# Patient Record
Sex: Male | Born: 1960
Health system: Southern US, Community
[De-identification: ages and names within clinical notes are randomized; demographics above are authoritative.]

## PROBLEM LIST (undated history)

## (undated) DIAGNOSIS — I639 Cerebral infarction, unspecified: Secondary | ICD-10-CM

## (undated) DIAGNOSIS — I2510 Atherosclerotic heart disease of native coronary artery without angina pectoris: Secondary | ICD-10-CM

## (undated) DIAGNOSIS — F32A Depression, unspecified: Secondary | ICD-10-CM

## (undated) DIAGNOSIS — R569 Unspecified convulsions: Secondary | ICD-10-CM

## (undated) DIAGNOSIS — I219 Acute myocardial infarction, unspecified: Secondary | ICD-10-CM

## (undated) DIAGNOSIS — F329 Major depressive disorder, single episode, unspecified: Secondary | ICD-10-CM

## (undated) DIAGNOSIS — C801 Malignant (primary) neoplasm, unspecified: Secondary | ICD-10-CM

## (undated) DIAGNOSIS — F1910 Other psychoactive substance abuse, uncomplicated: Secondary | ICD-10-CM

## (undated) DIAGNOSIS — F419 Anxiety disorder, unspecified: Secondary | ICD-10-CM

## (undated) DIAGNOSIS — G709 Myoneural disorder, unspecified: Secondary | ICD-10-CM

## (undated) DIAGNOSIS — K861 Other chronic pancreatitis: Secondary | ICD-10-CM

## (undated) DIAGNOSIS — I10 Essential (primary) hypertension: Secondary | ICD-10-CM

## (undated) DIAGNOSIS — E119 Type 2 diabetes mellitus without complications: Secondary | ICD-10-CM

## (undated) DIAGNOSIS — M5430 Sciatica, unspecified side: Secondary | ICD-10-CM

## (undated) DIAGNOSIS — E785 Hyperlipidemia, unspecified: Secondary | ICD-10-CM

## (undated) HISTORY — DX: Myoneural disorder, unspecified: G70.9

## (undated) HISTORY — DX: Major depressive disorder, single episode, unspecified: F32.9

## (undated) HISTORY — DX: Anxiety disorder, unspecified: F41.9

## (undated) HISTORY — PX: CORONARY STENT PLACEMENT: SHX1402

## (undated) HISTORY — DX: Hyperlipidemia, unspecified: E78.5

## (undated) HISTORY — DX: Other psychoactive substance abuse, uncomplicated: F19.10

## (undated) HISTORY — PX: BACK SURGERY: SHX140

## (undated) HISTORY — PX: TRACHEOSTOMY CLOSURE: SHX458

## (undated) HISTORY — DX: Type 2 diabetes mellitus without complications: E11.9

## (undated) HISTORY — DX: Depression, unspecified: F32.A

## (undated) HISTORY — DX: Cerebral infarction, unspecified: I63.9

## (undated) HISTORY — DX: Other chronic pancreatitis: K86.1

---

## 2000-12-29 ENCOUNTER — Ambulatory Visit (HOSPITAL_BASED_OUTPATIENT_CLINIC_OR_DEPARTMENT_OTHER): Admission: RE | Admit: 2000-12-29 | Discharge: 2000-12-29 | Payer: Self-pay | Admitting: Orthopedic Surgery

## 2000-12-29 ENCOUNTER — Encounter (INDEPENDENT_AMBULATORY_CARE_PROVIDER_SITE_OTHER): Payer: Self-pay

## 2004-09-04 ENCOUNTER — Emergency Department (HOSPITAL_COMMUNITY): Admission: EM | Admit: 2004-09-04 | Discharge: 2004-09-04 | Payer: Self-pay | Admitting: Emergency Medicine

## 2007-02-25 ENCOUNTER — Ambulatory Visit (HOSPITAL_COMMUNITY): Admission: RE | Admit: 2007-02-25 | Discharge: 2007-02-25 | Payer: Self-pay | Admitting: Emergency Medicine

## 2007-02-25 ENCOUNTER — Emergency Department (HOSPITAL_COMMUNITY): Admission: EM | Admit: 2007-02-25 | Discharge: 2007-02-25 | Payer: Self-pay | Admitting: Emergency Medicine

## 2007-08-29 ENCOUNTER — Inpatient Hospital Stay (HOSPITAL_COMMUNITY): Admission: EM | Admit: 2007-08-29 | Discharge: 2007-08-30 | Payer: Self-pay | Admitting: Emergency Medicine

## 2007-10-14 ENCOUNTER — Inpatient Hospital Stay (HOSPITAL_COMMUNITY): Admission: EM | Admit: 2007-10-14 | Discharge: 2007-10-18 | Payer: Self-pay | Admitting: *Deleted

## 2007-11-15 ENCOUNTER — Inpatient Hospital Stay (HOSPITAL_COMMUNITY): Admission: EM | Admit: 2007-11-15 | Discharge: 2007-11-16 | Payer: Self-pay | Admitting: Emergency Medicine

## 2007-11-15 ENCOUNTER — Ambulatory Visit: Payer: Self-pay | Admitting: Internal Medicine

## 2007-12-15 ENCOUNTER — Emergency Department (HOSPITAL_COMMUNITY): Admission: EM | Admit: 2007-12-15 | Discharge: 2007-12-16 | Payer: Self-pay | Admitting: Emergency Medicine

## 2008-08-15 ENCOUNTER — Emergency Department (HOSPITAL_COMMUNITY): Admission: EM | Admit: 2008-08-15 | Discharge: 2008-08-15 | Payer: Self-pay | Admitting: Emergency Medicine

## 2008-09-24 ENCOUNTER — Emergency Department (HOSPITAL_COMMUNITY): Admission: EM | Admit: 2008-09-24 | Discharge: 2008-09-24 | Payer: Self-pay | Admitting: Emergency Medicine

## 2008-11-04 ENCOUNTER — Emergency Department (HOSPITAL_COMMUNITY): Admission: EM | Admit: 2008-11-04 | Discharge: 2008-11-04 | Payer: Self-pay | Admitting: Emergency Medicine

## 2008-11-08 ENCOUNTER — Emergency Department (HOSPITAL_COMMUNITY): Admission: EM | Admit: 2008-11-08 | Discharge: 2008-11-08 | Payer: Self-pay | Admitting: Emergency Medicine

## 2008-12-19 ENCOUNTER — Ambulatory Visit: Payer: Self-pay | Admitting: Internal Medicine

## 2008-12-19 ENCOUNTER — Inpatient Hospital Stay (HOSPITAL_COMMUNITY): Admission: EM | Admit: 2008-12-19 | Discharge: 2008-12-21 | Payer: Self-pay | Admitting: Emergency Medicine

## 2009-01-19 ENCOUNTER — Emergency Department (HOSPITAL_COMMUNITY): Admission: EM | Admit: 2009-01-19 | Discharge: 2009-01-20 | Payer: Self-pay | Admitting: Emergency Medicine

## 2009-01-21 ENCOUNTER — Emergency Department (HOSPITAL_COMMUNITY): Admission: EM | Admit: 2009-01-21 | Discharge: 2009-01-21 | Payer: Self-pay | Admitting: Emergency Medicine

## 2009-01-23 ENCOUNTER — Inpatient Hospital Stay (HOSPITAL_COMMUNITY): Admission: EM | Admit: 2009-01-23 | Discharge: 2009-01-24 | Payer: Self-pay | Admitting: Emergency Medicine

## 2010-01-26 ENCOUNTER — Emergency Department (HOSPITAL_COMMUNITY): Admission: EM | Admit: 2010-01-26 | Discharge: 2010-01-26 | Payer: Self-pay | Admitting: Emergency Medicine

## 2010-02-17 ENCOUNTER — Emergency Department (HOSPITAL_COMMUNITY)
Admission: EM | Admit: 2010-02-17 | Discharge: 2010-02-17 | Payer: Self-pay | Source: Home / Self Care | Admitting: Emergency Medicine

## 2010-03-24 ENCOUNTER — Encounter
Admission: RE | Admit: 2010-03-24 | Discharge: 2010-03-24 | Payer: Self-pay | Source: Home / Self Care | Attending: Neurosurgery | Admitting: Neurosurgery

## 2010-04-07 ENCOUNTER — Encounter (INDEPENDENT_AMBULATORY_CARE_PROVIDER_SITE_OTHER): Payer: Self-pay | Admitting: Nurse Practitioner

## 2010-04-28 IMAGING — CR DG ABDOMEN 1V
1 series · 1 of 1 positions shown · non-contrast
Comparison: Abdominal ultrasound 10/14/2007

CLINICAL DATA: Vomiting, fever, cough.

ABDOMEN - 1 VIEW

[t abdomen supine]
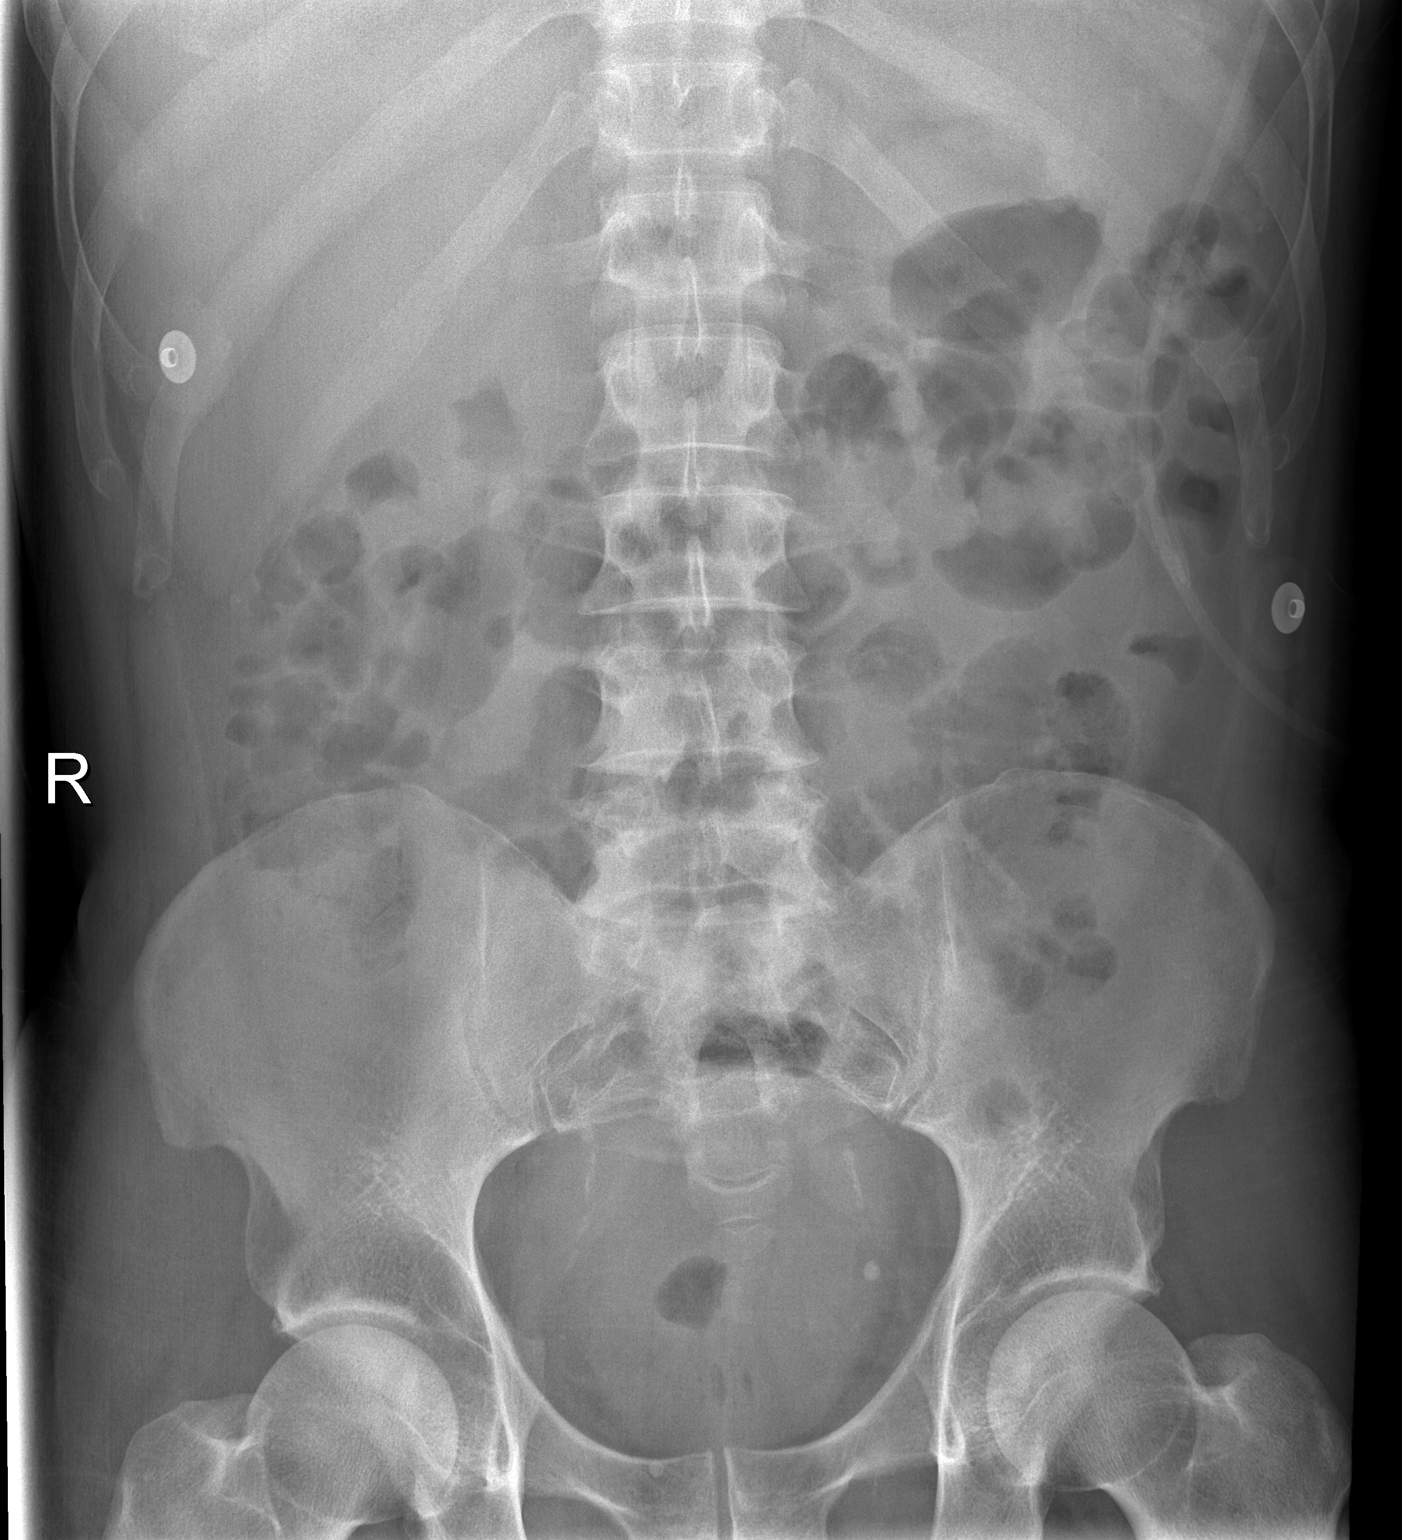

[1 of 1 positions shown; findings below may reference images not displayed]

FINDINGS: There may be scattered mild distension of small bowel.
Gas is seen in the colon and rectum.
IMPRESSION: Nonspecific bowel gas pattern.

## 2010-04-29 ENCOUNTER — Other Ambulatory Visit (HOSPITAL_COMMUNITY): Payer: Self-pay

## 2010-05-01 ENCOUNTER — Ambulatory Visit (HOSPITAL_COMMUNITY): Payer: Medicare Other | Attending: Neurosurgery

## 2010-05-01 ENCOUNTER — Other Ambulatory Visit: Payer: Self-pay | Admitting: Neurosurgery

## 2010-05-01 DIAGNOSIS — Z01818 Encounter for other preprocedural examination: Secondary | ICD-10-CM | POA: Insufficient documentation

## 2010-05-01 NOTE — Letter (Signed)
Summary: [name]   Imported By: Arta Bruce 04/24/2010 10:06:37  _____________________________________________________________________  External Attachment:    Type:   Image     Comment:   External Document

## 2010-05-02 ENCOUNTER — Inpatient Hospital Stay (HOSPITAL_COMMUNITY)
Admission: RE | Admit: 2010-05-02 | Discharge: 2010-05-04 | DRG: 897 | Disposition: A | Discharge status: Home / Self Care | Payer: Medicare Other | Source: Ambulatory Visit | Attending: Psychiatry | Admitting: Psychiatry

## 2010-05-02 ENCOUNTER — Other Ambulatory Visit (HOSPITAL_COMMUNITY): Payer: Medicare Other

## 2010-05-02 DIAGNOSIS — M51379 Other intervertebral disc degeneration, lumbosacral region without mention of lumbar back pain or lower extremity pain: Secondary | ICD-10-CM

## 2010-05-02 DIAGNOSIS — M5137 Other intervertebral disc degeneration, lumbosacral region: Secondary | ICD-10-CM

## 2010-05-02 DIAGNOSIS — F1994 Other psychoactive substance use, unspecified with psychoactive substance-induced mood disorder: Secondary | ICD-10-CM

## 2010-05-02 DIAGNOSIS — F39 Unspecified mood [affective] disorder: Secondary | ICD-10-CM

## 2010-05-02 DIAGNOSIS — Z560 Unemployment, unspecified: Secondary | ICD-10-CM

## 2010-05-02 DIAGNOSIS — F1020 Alcohol dependence, uncomplicated: Secondary | ICD-10-CM

## 2010-05-02 DIAGNOSIS — R45851 Suicidal ideations: Secondary | ICD-10-CM

## 2010-05-02 DIAGNOSIS — F17200 Nicotine dependence, unspecified, uncomplicated: Secondary | ICD-10-CM

## 2010-05-02 DIAGNOSIS — Z6379 Other stressful life events affecting family and household: Secondary | ICD-10-CM

## 2010-05-02 LAB — COMPREHENSIVE METABOLIC PANEL
ALT: 37 U/L (ref 0–53)
AST: 26 U/L (ref 0–37)
Albumin: 4.2 g/dL (ref 3.5–5.2)
CO2: 28 mEq/L (ref 19–32)
Chloride: 101 mEq/L (ref 96–112)
GFR calc Af Amer: >60 mL/min (ref >60)
GFR calc non Af Amer: >60 mL/min (ref >60)
Sodium: 140 mEq/L (ref 135–145)
Total Bilirubin: 0.6 mg/dL (ref 0.3–1.2)

## 2010-05-02 LAB — CBC
HCT: 38.1 % — ABNORMAL LOW (ref 39.0–52.0)
Hemoglobin: 12.6 g/dL — ABNORMAL LOW (ref 13.0–17.0)
RBC: 4.06 MIL/uL — ABNORMAL LOW (ref 4.22–5.81)
WBC: 7.4 10*3/uL (ref 4.0–10.5)

## 2010-05-02 LAB — URINALYSIS, ROUTINE W REFLEX MICROSCOPIC
Hgb urine dipstick: NEGATIVE
Specific Gravity, Urine: 1.043 — ABNORMAL HIGH (ref 1.005–1.030)
Urine Glucose, Fasting: NEGATIVE mg/dL
pH: 6 (ref 5.0–8.0)

## 2010-05-03 ENCOUNTER — Inpatient Hospital Stay (HOSPITAL_COMMUNITY): Admit: 2010-05-03 | Payer: Self-pay

## 2010-05-03 ENCOUNTER — Ambulatory Visit (HOSPITAL_COMMUNITY)
Admission: EM | Admit: 2010-05-03 | Discharge: 2010-05-03 | Disposition: A | Discharge status: Home / Self Care | Payer: Medicare Other | Source: Ambulatory Visit | Attending: Psychiatry | Admitting: Psychiatry

## 2010-05-03 LAB — DRUGS OF ABUSE SCREEN W/O ALC, ROUTINE URINE
Amphetamine Screen, Ur: NEGATIVE
Barbiturate Quant, Ur: NEGATIVE
Cocaine Metabolites: NEGATIVE
Creatinine,U: 440.6 mg/dL
Methadone: NEGATIVE
Phencyclidine (PCP): NEGATIVE

## 2010-05-03 NOTE — H&P (Signed)
NAMEHERBERTO, [name]               ACCOUNT NO.:  [id number]  MEDICAL RECORD NO.:  [id number]           PATIENT TYPE:  I  LOCATION:  0506                          FACILITY:  [facility]  PHYSICIAN:  Marlis Edelson, DO        DATE OF BIRTH:  May 20, 1960  DATE OF ADMISSION:  05/02/2010 DATE OF DISCHARGE:                      PSYCHIATRIC ADMISSION ASSESSMENT   CHIEF COMPLAINT:  Expressed suicidal ideation.  HISTORY OF CHIEF COMPLAINT:  [name] is a 50 year old African American male who was admitted to the [facility] after he presented as a walk-in on the afternoon of May 02, 2010.  He reports that he was in the preop clinic this morning for a scheduled back surgery when he was referred to the [facility] for evaluation.  He apparently had told them in the preop clinic that he took a number of pills in a suicide attempt.  He then left the hospital. The clinic called the police. The police came to his home and talkedwith him.  He was then told to come to the [facility] for evaluation before he could be seen for surgical clearance.  When he presented he was simply asking for psychological clearance in order to return to the surgery clinic and be able to proceed with his scheduled back surgery.  He reported suicidal ideation and attempts by overdose to the assessment team.  He was therefore admitted due to his recent suicide attempt.  He reports to this provider that he took a number of pills and that taking the pills was to "commit suicide.  He reports that he took the pills on Tuesday or Wednesday morning, he thinks it was closer to Wednesday morning.  He believes his wife is cheating on him because he is having a great deal of conflicts with her.  He has no evidence that she is but it is his thought that there is  marital infidelity going on. He was also recently charged with a DUI after having a perfect driving record.  He also stated that he  was scared over having the upcoming surgery.  He decided that suicide was a more viable option than going through with the surgery or dealing with the issues with his wife.  He was insistent upon his arrival and is persistent in his insistence that he has no depressive symptoms.  He was not intoxicated at the time of suicide attempt.  He has no prior history of suicide attempts and he wishes he had never mentioned this to the assessment staff because of the trouble it has caused.  There appears to be a disconnect in his wanting to commit suicide on Wednesday and his current down playing of the severity of this event.  He relates no history of psychiatric care or problems and minimized alcohol even though had a DUI and records indicate he has a more significant problem with alcohol dependency than he likes to admit.  PAST PSYCHIATRIC HISTORY:  He denies any history of psychiatric care. He has never been hospitalized. He has never been evaluated by a psychiatrist. He has had no prior suicide attempts.  He has no history of self-mutilation.  MEDICAL HISTORY:  Review of records indicates a seizure disorder which appears to have been associated with alcohol withdrawal.  He also has a history of alcoholic hepatitis, lumbar spine disease with two previous back surgeries.  He has also had alcoholic related pancreatitis.  He relates no history of loss of consciousness or significant head trauma.  CURRENT MEDICATIONS:  Hydrocodone 8-10 tablets per day, unspecified dose.  DRUG ALLERGIES:  MORPHINE WHICH CAUSES ITCHING.  SOCIAL HISTORY:  He has been married for 27 years.  He has one 25 year old daughter.  He is currently on disability.  Previously he worked as a Community education officer.  Education - high school graduate.  Military history -  [place] [employer] times 11 years.  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  He has no history of legal  entanglement.  No religious preferences.  Denies any history of abuse.  SUBSTANCE USE HISTORY:  He states he is a smoker who has smoked over the past 30 years.  He has recently decreased his smoking down to a half pack of cigarettes per day.  He denies any illicit drugs.  He reports that he drinks one pint about one time per week.  Records reflect that he is a heavier drinker than that.  He denied any history of DTs or addiction, and again minimizes alcohol-related problems.  FAMILY HISTORY:  Maternal aunt has suffered from drug addiction.  His grandmother suffered from hypertension.  MENTAL STATUS EXAM:  He is well-developed, well-nourished, he appeared to be uncomfortable because of back pain which radiates to his left lower extremity.  He ambulates with a cane and has a limp on the left side.  He has no other physical complaints.  He was cooperative, although a bit guarded.  His speech was clear, coherent, regular rate, rhythm, volume and tone.  He expresses his mood is good but his affect is discongruent and appears more restricted.  He is very concerned about having to stay in a psychiatric hospital.  His thought process was linear.  He was goal-directed towards early discharge including discharge following this evaluation.  His thought content per him is unremarkable for any current suicidal ideation.  He has had no homicidal ideation and no perceptual symptoms.  No paranoia.  No delusions.  No ideas of reference.  His judgment appears to be impaired and insight shallow.  Psychomotor activity was within normal limits with the patient moving about some due to his back discomfort  DIAGNOSES:  AXIS I:  Alcohol dependence.  Mood disorder, likely combined type (the mood is likely stemming from both the use of alcohol, current marital stressors and his pain. It could also be exacerbated by his current need for opiate medications for pain control.  Alcohol is likely a major  contributor to his overall mood. AXIS II:  Deferred. AXIS III:  Per past medical history. AXIS IV:  Marital discord and medical problems. AXIS V:  38.  RECOMMENDATIONS:  [name] is being admitted to the hospital for observation. There will need to be a family meeting in order to clear him for discharge particularly given the suicidal comments. Will continue his hydrocodone for pain.  I would like to check some baseline laboratories.  Will provide Librium as needed because of his history of alcohol consumption.  Would also like to supplement him with thiamine. If needed we will institute a full-blown Librium  detox protocol.          ______________________________ Marlis Edelson, DO    DB/MEDQ  D:  05/02/2010  T:  05/02/2010  Job:  [id number]  Electronically Signed by Marlis Edelson MD on 05/03/2010 07:16:28 PM

## 2010-05-04 NOTE — Discharge Summary (Addendum)
[name], [name]               ACCOUNT NO.:  [id number]  MEDICAL RECORD NO.:  [id number]           PATIENT TYPE:  I  LOCATION:  ED                            FACILITY:  [facility]  PHYSICIAN:  Marlis Edelson, DO        DATE OF BIRTH:  1960-10-24  DATE OF ADMISSION:  05/03/2010 DATE OF DISCHARGE:  05/03/2010                              DISCHARGE SUMMARY   CHIEF COMPLAINT:  Suicidal attempt.  HISTORY OF CHIEF COMPLAINT:  [name] is a 50 year old African American male who was admitted to the [facility] on May 02, 2010.  He was in the pre-op clinic for an upcoming back surgery, when he informed them that he had taken an overdose of pills in order to "commit suicide."  The preop clinic was planning on clearing him for a surgery on the day of his admission for back problems, immediately stopped the process and told him to come to the [facility] for a suicidal evaluation.  He left the hospital, they sent thepolice to his home and the surgery was cancelled pending psychiatric treatment.  He stated he had taken the pills on Wednesday. It appeared to be late Tuesday night or early Wednesday morning when he took about 10 Excedrin.  He was thinking, at the time, that his wife was cheating, that he had conflicts with her and he was also scared about having the upcoming surgery.  He had also recently been charged with a DWI after having "a perfect driving record."  He has an unremarkable past psychiatric history.  He has never been hospitalized.  He has never had prior suicidal attempts and he has no history of self-mutilation.  In regards to medical problems, he does suffer from severe back pain. He has had two previous back surgeries and is facing now a third.  He also has a history of alcohol withdrawal, alcoholic hepatitis and pancreatitis, and a seizure disorder thought to be from alcohol dependency.  He tends to minimize his alcohol use.  He was  admitted with an admission diagnosis of mood disorder likely combined type, coming from both alcohol use and severe pain, stemming from his back problems.  He is also currently taking narcotics, which could contribute to depression.  He is alcohol dependent and did admit this following further discussions.  HOSPITAL COURSE:  [name] was admitted to the hospital against his will.  He did not feel that he was actively suicidal or homicidal.  We did admit him to the hospital to evaluate some laboratory.  We continued hydrocodone because of the severity of his pain.  He is currently ambulating with a cane and his wife was contacted by therapists, as well as by this provider, during the course of hospitalization.  He denied any depressive symptoms of the need for antidepressant medications and related that no significant anxiety.  We talked about his need to stop his alcohol use.  He had no suicidal or homicidal ideation on the unit. There was no evidence of psychosis.  No signs of alcohol withdrawal.  He ambulated in the hallway without difficulty  and he remained stable with the exception of some elevated blood pressures, that did appear to be associated with pain.  Laboratory including drugs of abuse showed all values to be negative, with the exception of the opiates he has been prescribed and is currently taking opiates.  TSH was normal at 0.545 uIU/mL.  Direct bilirubin 0.1 mg/dL.  A CMP showed all values to be normal, with the exception of a mild decrease in potassium at 3.3 mEq/L. CBC showed a hemoglobin of 12.6, hematocrit 38.1, white blood cell count 7.4 and platelet count 204; indices were normal.  Urinalysis showed an elevated specific gravity, but the patient had been n.p.o.  His urine was clear, with small amount of bilirubin.  There was no bilirubin present in the blood as per the above direct bilirubin.  There was a trace of ketones.  No blood, no protein, no nitrates, no  leukocytes and no glucose.  The therapist contacted his wife, who did not have safety concerns about [name].  She stated that he had taken over-the-counter medications in the form of Excedrin from what seemed to be frustration.  She related that he also made himself throw up immediately afterwards.  She reports that he works a lot and has had some concerns about marital infidelity, because of a family history of marital infidelity.  She did relate that she will take the patient's medications and lock them up or take them to work with her and administer them after his surgery.  She feels that he was not trying to harm himself and that he is not currently a risk for suicide.  She was provided with adequate information on crisis prevention and how to seek help, should he have any recurrent suicidal or develop homicidal reactions, or other type symptoms.  I spoke with her on the date of discharge.  She had a positive safety plan with supervision of his medications and stated that she would be with him. We felt that his discharge, at this point, would be okay given the amount of supervision and monitoring that can be provided at his home. We also feel it is important for Mr. [name] to ahead and call the pre-op clinic tomorrow, reschedule his appointment, set the surgery and have that as soon as possible, since the pain and his back problem, is a large component of what he is suffering from.  He also is not fearful of having the surgery at this point, having been through it before and he feels that the prognosis this time may be a bit better, as per his surgeons recommendations and advice.  He had no further suicidal or homicidal ideation, no psychosis and his affect was broader.  He was therefore discharged to the company of his wife.  LABORATORY AND IMAGING DATA:  As above.  CONSULTATIONS:  None.  COMPLICATIONS:  None.  PROCEDURES:  None.  MENTAL STATUS EXAMINATION:  At the time  of discharge, he is casually dressed.  His eye contact is fair.  He was engaging with peers and with staff.  His motor behavior was normal.  Speech clear, coherent, regular rate, rhythm, volume and tone.  His level of consciousness was alert. His mood was "good."  His affect is broader.  He has been noted to smile more than at the time of admission.  He is denying pertinent depressive symptoms or anxiety.  He relates no perceptual symptoms, ideas of reference, delusions or paranoia.  No suicidal or homicidal thought, intent or plan.  His judgment appears fair at present.  His insight is a bit shallow in regards to his long-term substance dependency and need for more aggressive treatment.  He is fully oriented.  ASSESSMENT:  AXIS I: Mood disorder, likely combined type, alcohol dependence. AXIS II: Deferred. AXIS III: Per past medical history. AXIS IV: Medical problems. AXIS V: 60.  DISCHARGE INSTRUCTIONS:  He is to return to the hospital for any recurrence of suicidal or homicidal thought, intent or plan.  His wife was instructed to return him to the hospital for any marked change in mood or affect.  He is to continue his current medication which is hydrocodone for pain, as prescribed by his orthopedic surgeon and is to contact that clinic tomorrow for rescheduling of his surgery and pre-op clearance.  I have also recommended that he participated in [place] and other abstinence programs, and recommended individual therapy.  We were unable to set that up since his discharge is on a Sunday, but will refer them to the [facility] for further information and follow up as needed.  PROGNOSIS:  Fair with the appropriate psychiatric follow up and abstinence from alcohol.  He should also have improvement in mood following successful surgery on his back.          ______________________________ Marlis Edelson, DO     DB/MEDQ  D:  05/04/2010  T:  05/04/2010  Job:   [id number]  Electronically Signed by Marlis Edelson MD on 05/20/2010 07:29:01 PM

## 2010-05-07 LAB — OPIATE, QUANTITATIVE, URINE
Codeine Urine: NEGATIVE NG/ML
Hydromorphone GC/MS Conf: 709 NG/ML — ABNORMAL HIGH
Oxycodone, ur: NEGATIVE NG/ML
Oxymorphone: NEGATIVE NG/ML

## 2010-05-16 LAB — HEPATIC FUNCTION PANEL
Albumin: 4.5 g/dL (ref 3.5–5.2)
Alkaline Phosphatase: 61 U/L (ref 39–117)
Indirect Bilirubin: 0.4 mg/dL (ref 0.3–0.9)
Total Protein: 7.2 g/dL (ref 6.0–8.3)

## 2010-05-16 LAB — CBC
HCT: 39.7 % (ref 39.0–52.0)
Hemoglobin: 13.2 g/dL (ref 13.0–17.0)
MCV: 91.5 fL (ref 78.0–100.0)
RBC: 4.34 MIL/uL (ref 4.22–5.81)
WBC: 7.8 10*3/uL (ref 4.0–10.5)

## 2010-05-16 LAB — SURGICAL PCR SCREEN
MRSA, PCR: NEGATIVE
Staphylococcus aureus: NEGATIVE

## 2010-05-16 LAB — BASIC METABOLIC PANEL
BUN: 6 mg/dL (ref 6–23)
Chloride: 100 mEq/L (ref 96–112)
Creatinine, Ser: 0.9 mg/dL (ref 0.4–1.5)
GFR calc Af Amer: >60 mL/min (ref >60)
GFR calc non Af Amer: >60 mL/min (ref >60)

## 2010-05-16 LAB — TYPE AND SCREEN
ABO/RH(D): O POS
Antibody Screen: NEGATIVE

## 2010-06-04 ENCOUNTER — Encounter (HOSPITAL_COMMUNITY)
Admission: RE | Admit: 2010-06-04 | Discharge: 2010-06-04 | Disposition: A | Discharge status: Home / Self Care | Payer: Medicare Other | Source: Ambulatory Visit | Attending: Neurosurgery | Admitting: Neurosurgery

## 2010-06-04 DIAGNOSIS — Z01818 Encounter for other preprocedural examination: Secondary | ICD-10-CM | POA: Insufficient documentation

## 2010-06-04 LAB — CBC
HCT: 45.5 % (ref 39.0–52.0)
MCHC: 34.1 g/dL (ref 30.0–36.0)
Platelets: 231 10*3/uL (ref 150–400)
RDW: 13.9 % (ref 11.5–15.5)
WBC: 9.8 10*3/uL (ref 4.0–10.5)

## 2010-06-04 LAB — BASIC METABOLIC PANEL
BUN: 11 mg/dL (ref 6–23)
Creatinine, Ser: 0.99 mg/dL (ref 0.4–1.5)
GFR calc non Af Amer: >60 mL/min (ref >60)
Glucose, Bld: 102 mg/dL — ABNORMAL HIGH (ref 70–99)
Potassium: 4 mEq/L (ref 3.5–5.1)

## 2010-06-12 ENCOUNTER — Inpatient Hospital Stay (HOSPITAL_COMMUNITY): Payer: Medicare Other

## 2010-06-12 ENCOUNTER — Inpatient Hospital Stay (HOSPITAL_COMMUNITY)
Admission: RE | Admit: 2010-06-12 | Discharge: 2010-06-15 | DRG: 460 | Disposition: A | Discharge status: Home / Self Care | Payer: Medicare Other | Source: Ambulatory Visit | Attending: Neurosurgery | Admitting: Neurosurgery

## 2010-06-12 DIAGNOSIS — M5137 Other intervertebral disc degeneration, lumbosacral region: Secondary | ICD-10-CM | POA: Diagnosis present

## 2010-06-12 DIAGNOSIS — M5126 Other intervertebral disc displacement, lumbar region: Secondary | ICD-10-CM | POA: Diagnosis present

## 2010-06-12 DIAGNOSIS — M48061 Spinal stenosis, lumbar region without neurogenic claudication: Principal | ICD-10-CM | POA: Diagnosis present

## 2010-06-12 DIAGNOSIS — M47817 Spondylosis without myelopathy or radiculopathy, lumbosacral region: Secondary | ICD-10-CM | POA: Diagnosis present

## 2010-06-12 DIAGNOSIS — M51379 Other intervertebral disc degeneration, lumbosacral region without mention of lumbar back pain or lower extremity pain: Secondary | ICD-10-CM | POA: Diagnosis present

## 2010-06-12 LAB — TYPE AND SCREEN
Antibody Screen: NEGATIVE
Antibody Screen: NEGATIVE

## 2010-06-17 NOTE — Op Note (Signed)
[name], [name]               ACCOUNT NO.:  [id number]  MEDICAL RECORD NO.:  [id number]           PATIENT TYPE:  I  LOCATION:  3016                         FACILITY:  [facility]  PHYSICIAN:  Danae Orleans. Venetia Maxon, M.D.  DATE OF BIRTH:  1960/06/24  DATE OF PROCEDURE:  06/12/2010 DATE OF DISCHARGE:                              OPERATIVE REPORT   PREOPERATIVE DIAGNOSES:  Recurrent herniated lumbar disk L4-5 with spondylosis, degenerative disk disease, and radiculopathy.  POSTOPERATIVE DIAGNOSES:  Recurrent herniated lumbar disk L4-5 with spondylosis, degenerative disk disease, and radiculopathy.  PROCEDURE: 1. Redo laminectomy, L4-5. 2. Diskectomy, L4-5. 3. Transforaminal lumbar interbody fusion with PEEK interbody cage,     autograft, allograft, and PureGen. 4. Pedicle screw fixation nonsegmental L4 through L5 levels. 5. Posterolateral arthrodesis, L4 through L5 levels.  SURGEON:  Danae Orleans. Venetia Maxon, MD  ASSISTANT:  Georgiann Cocker, RN  ANESTHESIA:  General endotracheal anesthesia.  ESTIMATED BLOOD LOSS:  150 mL.  COMPLICATIONS:  None.  DISPOSITION:  To recovery.  INDICATIONS:  [name] is a 50 year old man with chronic low back and left leg pain.  He in the remote past had a left L4-5 diskectomy. He has severe degeneration at this level with marked spondylosis and foraminal prolapse with a large recurrent disk herniation and it was elected to take him to surgery for redo diskectomy with fusion at this level.  PROCEDURE:  [name] was brought to the operating room.  Following satisfactory and uncomplicated induction of general endotracheal anesthesia and placement of intravenous lines and Foley catheter, the patient was placed in a prone position on the operating table.  Using the Ridgely table, soft tissue and bone bony prominences were padded appropriately.  Previous incision was reopened after prepping and draping in the usual sterile fashion and infiltrating skin with  local lidocaine.  The old incision was ellipsed out.  The L4 and L5 transverse processes were exposed bilaterally.  There was significant amount of scar tissue overlying L4-5 interspace on the left.  Confirmatory lateral radiograph demonstrated marker probes at the L4 and L5 pedicles. Subsequently, using loupe magnification, the previous scar tissue was taken down on the left side and the inferior facet of L4 on the left was disarticulated and removed.  There was significant amount of spondylitic scar tissue, a large spondylitic ridge causing compression of the left L4 nerve root.  The ridge was taken down with osteophyte tool.  A thorough diskectomy was performed.  Contralateral distraction was performed on the right.  Endplates were prepared and after trial sizing, it was elected to use a 9-mm PEEK TLIF cage which was packed with allograft and PureGen.  A 3 mL of bone autograft which had been run through the bone mill was placed deep in the interspace and countersunk appropriately.  The implant was then tamped into position, countersunk appropriately.  Approximately 5 mL of Vitoss foam pack mixed with autologous blood was then inserted in the interspace and tamped into position as well.  Subsequently with the use of the AP and lateral fluoroscopy, pedicle screws were placed at L4 and L5 bilaterally using 45 mm x 6.5  mm screws at L4 and 40 x 6.5-mm screws at L5.  All screws had excellent purchase and position was confirmed on AP and lateral fluoroscopy.  Posterolateral region was extensively decorticated on the right side of midline at L4 through L5 level.  Remaining Vitoss foam pack was placed and tamped into position.  A 50-mm rod was placed on the right and 40-mm rod was placed on the left.  All screws were locked down in situ.  Self-retaining retractor was removed.  Lumbodorsal fascia was closed with 1 Vicryl sutures, subcutaneous tissues were approximated with 2-0 Vicryl interrupted  inverted sutures.  Skin edges were reapproximated with 3-0 Vicryl subcuticular stitch.  The wound was dressed with Benzoin, Steri-Strips, Telfa gauze, and tape.  The patient was extubated in the operating room and taken to recovery in stable satisfactory condition, having tolerated his operation well.  Counts were correct at the end of the case.     Danae Orleans. Venetia Maxon, M.D.     JDS/MEDQ  D:  06/12/2010  T:  06/13/2010  Job:  [id number]  Electronically Signed by Maeola Harman M.D. on 06/17/2010 07:34:56 AM

## 2010-06-18 LAB — CBC
HCT: 31.1 % — ABNORMAL LOW (ref 39.0–52.0)
HCT: 36.2 % — ABNORMAL LOW (ref 39.0–52.0)
HCT: 46 % (ref 39.0–52.0)
Hemoglobin: 10.6 g/dL — ABNORMAL LOW (ref 13.0–17.0)
Hemoglobin: 12.2 g/dL — ABNORMAL LOW (ref 13.0–17.0)
Hemoglobin: 15.1 g/dL (ref 13.0–17.0)
Hemoglobin: 15.6 g/dL (ref 13.0–17.0)
MCHC: 33.8 g/dL (ref 30.0–36.0)
MCHC: 33.9 g/dL (ref 30.0–36.0)
MCHC: 34 g/dL (ref 30.0–36.0)
MCV: 101.5 fL — ABNORMAL HIGH (ref 78.0–100.0)
MCV: 102.3 fL — ABNORMAL HIGH (ref 78.0–100.0)
MCV: 102.5 fL — ABNORMAL HIGH (ref 78.0–100.0)
Platelets: 166 10*3/uL (ref 150–400)
RBC: 4.39 MIL/uL (ref 4.22–5.81)
RBC: 4.49 MIL/uL (ref 4.22–5.81)
RDW: 17.5 % — ABNORMAL HIGH (ref 11.5–15.5)
RDW: 18.2 % — ABNORMAL HIGH (ref 11.5–15.5)
RDW: 18.5 % — ABNORMAL HIGH (ref 11.5–15.5)
RDW: 18.5 % — ABNORMAL HIGH (ref 11.5–15.5)
WBC: 13.4 10*3/uL — ABNORMAL HIGH (ref 4.0–10.5)

## 2010-06-18 LAB — COMPREHENSIVE METABOLIC PANEL
ALT: 19 U/L (ref 0–53)
ALT: 29 U/L (ref 0–53)
AST: 28 U/L (ref 0–37)
AST: 35 U/L (ref 0–37)
Albumin: 3.6 g/dL (ref 3.5–5.2)
Alkaline Phosphatase: 53 U/L (ref 39–117)
Alkaline Phosphatase: 96 U/L (ref 39–117)
BUN: 3 mg/dL — ABNORMAL LOW (ref 6–23)
BUN: 4 mg/dL — ABNORMAL LOW (ref 6–23)
BUN: 5 mg/dL — ABNORMAL LOW (ref 6–23)
CO2: 20 mEq/L (ref 19–32)
CO2: 22 mEq/L (ref 19–32)
CO2: 24 mEq/L (ref 19–32)
CO2: 25 mEq/L (ref 19–32)
Calcium: 8.3 mg/dL — ABNORMAL LOW (ref 8.4–10.5)
Calcium: 8.8 mg/dL (ref 8.4–10.5)
Calcium: 9.1 mg/dL (ref 8.4–10.5)
Chloride: 98 mEq/L (ref 96–112)
Creatinine, Ser: 1.34 mg/dL (ref 0.4–1.5)
GFR calc Af Amer: >60 mL/min (ref >60)
GFR calc Af Amer: >60 mL/min (ref >60)
GFR calc non Af Amer: 57 mL/min — ABNORMAL LOW (ref >60)
GFR calc non Af Amer: >60 mL/min (ref >60)
GFR calc non Af Amer: >60 mL/min (ref >60)
GFR calc non Af Amer: >60 mL/min (ref >60)
Glucose, Bld: 110 mg/dL — ABNORMAL HIGH (ref 70–99)
Glucose, Bld: 138 mg/dL — ABNORMAL HIGH (ref 70–99)
Glucose, Bld: 72 mg/dL (ref 70–99)
Glucose, Bld: 96 mg/dL (ref 70–99)
Potassium: 3.4 mEq/L — ABNORMAL LOW (ref 3.5–5.1)
Sodium: 134 mEq/L — ABNORMAL LOW (ref 135–145)
Sodium: 138 mEq/L (ref 135–145)
Total Bilirubin: 2 mg/dL — ABNORMAL HIGH (ref 0.3–1.2)
Total Protein: 5.7 g/dL — ABNORMAL LOW (ref 6.0–8.3)
Total Protein: 6.2 g/dL (ref 6.0–8.3)
Total Protein: 7.1 g/dL (ref 6.0–8.3)
Total Protein: 7.3 g/dL (ref 6.0–8.3)

## 2010-06-18 LAB — DIFFERENTIAL
Basophils Absolute: 0.1 10*3/uL (ref 0.0–0.1)
Basophils Relative: 0 % (ref 0–1)
Basophils Relative: 1 % (ref 0–1)
Eosinophils Absolute: 0.4 10*3/uL (ref 0.0–0.7)
Eosinophils Absolute: 0.5 10*3/uL (ref 0.0–0.7)
Eosinophils Relative: 3 % (ref 0–5)
Eosinophils Relative: 3 % (ref 0–5)
Lymphocytes Relative: 8 % — ABNORMAL LOW (ref 12–46)
Lymphs Abs: 1 10*3/uL (ref 0.7–4.0)
Lymphs Abs: 1.7 10*3/uL (ref 0.7–4.0)
Monocytes Relative: 4 % (ref 3–12)
Monocytes Relative: 8 % (ref 3–12)
Neutro Abs: 13.3 10*3/uL — ABNORMAL HIGH (ref 1.7–7.7)
Neutrophils Relative %: 82 % — ABNORMAL HIGH (ref 43–77)

## 2010-06-18 LAB — LACTIC ACID, PLASMA
Lactic Acid, Venous: 0.9 mmol/L (ref 0.5–2.2)
Lactic Acid, Venous: 1.1 mmol/L (ref 0.5–2.2)
Lactic Acid, Venous: 2.2 mmol/L (ref 0.5–2.2)

## 2010-06-18 LAB — URINALYSIS, ROUTINE W REFLEX MICROSCOPIC
Glucose, UA: 100 mg/dL — AB
Hgb urine dipstick: NEGATIVE
Hgb urine dipstick: NEGATIVE
Ketones, ur: 15 mg/dL — AB
Nitrite: POSITIVE — AB
Nitrite: POSITIVE — AB
Protein, ur: 30 mg/dL — AB
Protein, ur: >300 mg/dL — AB
Specific Gravity, Urine: 1.037 — ABNORMAL HIGH (ref 1.005–1.030)
Urobilinogen, UA: 1 mg/dL (ref 0.0–1.0)
Urobilinogen, UA: 1 mg/dL (ref 0.0–1.0)
pH: 6 (ref 5.0–8.0)

## 2010-06-18 LAB — CK TOTAL AND CKMB (NOT AT ARMC)
CK, MB: 0.7 ng/mL (ref 0.3–4.0)
CK, MB: 1 ng/mL (ref 0.3–4.0)
Relative Index: 0.6 (ref 0.0–2.5)
Total CK: 201 U/L (ref 7–232)
Total CK: 232 U/L (ref 7–232)

## 2010-06-18 LAB — LIPASE, BLOOD
Lipase: 267 U/L — ABNORMAL HIGH (ref 11–59)
Lipase: 361 U/L — ABNORMAL HIGH (ref 11–59)
Lipase: 497 U/L — ABNORMAL HIGH (ref 11–59)

## 2010-06-18 LAB — CULTURE, BLOOD (ROUTINE X 2): Culture: NO GROWTH

## 2010-06-18 LAB — URINE MICROSCOPIC-ADD ON

## 2010-06-18 LAB — URINE CULTURE: Colony Count: 90000

## 2010-06-18 LAB — PHOSPHORUS: Phosphorus: 3.2 mg/dL (ref 2.3–4.6)

## 2010-06-18 LAB — RAPID URINE DRUG SCREEN, HOSP PERFORMED: Barbiturates: NOT DETECTED

## 2010-06-18 LAB — MAGNESIUM: Magnesium: 1.3 mg/dL — ABNORMAL LOW (ref 1.5–2.5)

## 2010-06-18 LAB — TROPONIN I: Troponin I: 0.02 ng/mL (ref 0.00–0.06)

## 2010-06-19 LAB — URINALYSIS, ROUTINE W REFLEX MICROSCOPIC
Glucose, UA: NEGATIVE mg/dL
Hgb urine dipstick: NEGATIVE
Ketones, ur: 15 mg/dL — AB
Protein, ur: NEGATIVE mg/dL
Urobilinogen, UA: 0.2 mg/dL (ref 0.0–1.0)

## 2010-06-19 LAB — BASIC METABOLIC PANEL
CO2: 29 mEq/L (ref 19–32)
Calcium: 8 mg/dL — ABNORMAL LOW (ref 8.4–10.5)
Calcium: 9 mg/dL (ref 8.4–10.5)
Chloride: 101 mEq/L (ref 96–112)
GFR calc Af Amer: >60 mL/min (ref >60)
GFR calc Af Amer: >60 mL/min (ref >60)
GFR calc non Af Amer: >60 mL/min (ref >60)
Glucose, Bld: 89 mg/dL (ref 70–99)
Potassium: 3.3 mEq/L — ABNORMAL LOW (ref 3.5–5.1)
Sodium: 138 mEq/L (ref 135–145)
Sodium: 139 mEq/L (ref 135–145)

## 2010-06-19 LAB — BASIC METABOLIC PANEL WITH GFR
BUN: 4 mg/dL — ABNORMAL LOW (ref 6–23)
CO2: 26 meq/L (ref 19–32)
Calcium: 7.9 mg/dL — ABNORMAL LOW (ref 8.4–10.5)
Chloride: 100 meq/L (ref 96–112)
Creatinine, Ser: 0.8 mg/dL (ref 0.4–1.5)
GFR calc non Af Amer: >60 mL/min
Glucose, Bld: 73 mg/dL (ref 70–99)
Potassium: 3 meq/L — ABNORMAL LOW (ref 3.5–5.1)
Sodium: 136 meq/L (ref 135–145)

## 2010-06-19 LAB — DIFFERENTIAL
Basophils Absolute: 0 K/uL (ref 0.0–0.1)
Basophils Absolute: 0.1 10*3/uL (ref 0.0–0.1)
Basophils Relative: 1 % (ref 0–1)
Eosinophils Absolute: 0.7 K/uL (ref 0.0–0.7)
Eosinophils Relative: 8 % — ABNORMAL HIGH (ref 0–5)
Lymphocytes Relative: 16 % (ref 12–46)
Lymphocytes Relative: 16 % (ref 12–46)
Lymphs Abs: 1.4 K/uL (ref 0.7–4.0)
Lymphs Abs: 1.7 10*3/uL (ref 0.7–4.0)
Monocytes Absolute: 0.8 K/uL (ref 0.1–1.0)
Monocytes Relative: 8 % (ref 3–12)
Neutro Abs: 6.3 K/uL (ref 1.7–7.7)
Neutro Abs: 7.3 10*3/uL (ref 1.7–7.7)
Neutrophils Relative %: 68 % (ref 43–77)
Neutrophils Relative %: 71 % (ref 43–77)

## 2010-06-19 LAB — CARDIAC PANEL(CRET KIN+CKTOT+MB+TROPI)
CK, MB: 0.9 ng/mL (ref 0.3–4.0)
CK, MB: 0.9 ng/mL (ref 0.3–4.0)
CK, MB: 1.1 ng/mL (ref 0.3–4.0)
CK, MB: 1.4 ng/mL (ref 0.3–4.0)
CK, MB: 1.5 ng/mL (ref 0.3–4.0)
Relative Index: 0.5 (ref 0.0–2.5)
Relative Index: 0.5 (ref 0.0–2.5)
Relative Index: 0.6 (ref 0.0–2.5)
Relative Index: 0.7 (ref 0.0–2.5)
Relative Index: 0.7 (ref 0.0–2.5)
Total CK: 178 U/L (ref 7–232)
Total CK: 190 U/L (ref 7–232)
Total CK: 192 U/L (ref 7–232)
Total CK: 213 U/L (ref 7–232)
Total CK: 227 U/L (ref 7–232)

## 2010-06-19 LAB — POCT I-STAT, CHEM 8
BUN: 7 mg/dL (ref 6–23)
Chloride: 97 mEq/L (ref 96–112)
Creatinine, Ser: 0.6 mg/dL (ref 0.4–1.5)
Potassium: 3.7 mEq/L (ref 3.5–5.1)
Sodium: 137 mEq/L (ref 135–145)

## 2010-06-19 LAB — CBC
HCT: 31 % — ABNORMAL LOW (ref 39.0–52.0)
HCT: 39.6 % (ref 39.0–52.0)
Hemoglobin: 10.5 g/dL — ABNORMAL LOW (ref 13.0–17.0)
Hemoglobin: 11.1 g/dL — ABNORMAL LOW (ref 13.0–17.0)
MCHC: 33.5 g/dL (ref 30.0–36.0)
MCHC: 33.9 g/dL (ref 30.0–36.0)
MCV: 102.2 fL — ABNORMAL HIGH (ref 78.0–100.0)
MCV: 103.7 fL — ABNORMAL HIGH (ref 78.0–100.0)
Platelets: 172 K/uL (ref 150–400)
Platelets: 293 10*3/uL (ref 150–400)
RBC: 3.04 MIL/uL — ABNORMAL LOW (ref 4.22–5.81)
RBC: 3.19 MIL/uL — ABNORMAL LOW (ref 4.22–5.81)
RDW: 20.1 % — ABNORMAL HIGH (ref 11.5–15.5)
WBC: 10.3 10*3/uL (ref 4.0–10.5)
WBC: 9.3 K/uL (ref 4.0–10.5)
WBC: 9.6 10*3/uL (ref 4.0–10.5)

## 2010-06-19 LAB — POCT CARDIAC MARKERS
CKMB, poc: 1.2 ng/mL (ref 1.0–8.0)
CKMB, poc: <1 ng/mL — ABNORMAL LOW (ref 1.0–8.0)
Myoglobin, poc: 102 ng/mL (ref 12–200)
Myoglobin, poc: 121 ng/mL (ref 12–200)

## 2010-06-19 LAB — CULTURE, BLOOD (ROUTINE X 2): Culture: NO GROWTH

## 2010-06-19 LAB — VITAMIN B12: Vitamin B-12: 443 pg/mL (ref 211–911)

## 2010-06-19 LAB — APTT: aPTT: 28 s (ref 24–37)

## 2010-06-19 LAB — LIPID PANEL
HDL: 58 mg/dL
Total CHOL/HDL Ratio: 2.8 ratio
Triglycerides: 54 mg/dL
VLDL: 11 mg/dL (ref 0–40)

## 2010-06-19 LAB — RAPID URINE DRUG SCREEN, HOSP PERFORMED
Amphetamines: NOT DETECTED
Barbiturates: NOT DETECTED
Benzodiazepines: NOT DETECTED

## 2010-06-19 LAB — MAGNESIUM
Magnesium: 1.2 mg/dL — ABNORMAL LOW (ref 1.5–2.5)
Magnesium: 1.7 mg/dL (ref 1.5–2.5)

## 2010-06-19 LAB — COMPREHENSIVE METABOLIC PANEL
AST: 98 U/L — ABNORMAL HIGH (ref 0–37)
Albumin: 3.7 g/dL (ref 3.5–5.2)
BUN: 6 mg/dL (ref 6–23)
Calcium: 9 mg/dL (ref 8.4–10.5)
Creatinine, Ser: 0.82 mg/dL (ref 0.4–1.5)
GFR calc Af Amer: >60 mL/min (ref >60)
GFR calc non Af Amer: >60 mL/min (ref >60)

## 2010-06-19 LAB — FOLATE: Folate: 3.8 ng/mL

## 2010-06-19 LAB — LIPASE, BLOOD: Lipase: 385 U/L — ABNORMAL HIGH (ref 11–59)

## 2010-06-19 LAB — PROTIME-INR
INR: 1.04 (ref 0.00–1.49)
Prothrombin Time: 13.5 s (ref 11.6–15.2)

## 2010-06-19 LAB — ETHANOL: Alcohol, Ethyl (B): 43 mg/dL — ABNORMAL HIGH (ref 0–10)

## 2010-06-19 LAB — AMMONIA: Ammonia: 38 umol/L — ABNORMAL HIGH (ref 11–35)

## 2010-06-19 LAB — LACTIC ACID, PLASMA: Lactic Acid, Venous: 1.5 mmol/L (ref 0.5–2.2)

## 2010-06-19 NOTE — Discharge Summary (Signed)
  NAMEDECORIAN, [name]               ACCOUNT NO.:  [id number]  MEDICAL RECORD NO.:  [id number]           PATIENT TYPE:  I  LOCATION:  3016                         FACILITY:  [facility]  PHYSICIAN:  Hewitt Shorts, M.D.DATE OF BIRTH:  01/06/1961  DATE OF ADMISSION:  06/12/2010 DATE OF DISCHARGE:  06/15/2010                              DISCHARGE SUMMARY   HISTORY OF PRESENT ILLNESS:  The patient is a 50 year old man under the care of Dr. Venetia Maxon.  He has had previous lumbar surgery, but returns with low back and left lumbar radicular pain.  Dr. Venetia Maxon evaluated him with MRI, which showed significant left lateral recess encroachment causing his left L5 radiculopathy.  The patient was admitted now for decompression and fusion.  General examination was unremarkable. Neurologic examination showed good strength other than for weakness of the right extensor hallucis longus, 4/5.  Sensation was decreased to pinprick in the left L5 distribution.  HOSPITAL COURSE:  The patient was admitted by Dr. Venetia Maxon, underwent a L4- 5 decompression, interbody fusion, and posterolateral fusion. Postoperatively, he has done well.  He was seen by physical therapy and occupational therapy.  He is up and ambulating.  His wound is healing nicely.  He is being given instructions regarding wound care and activities.  He is afebrile.  He is to return for followup with Dr. Venetia Maxon in about 3 weeks.  He has been given prescription for Percocet 1-2 tablets q.4-6 h. p.r.n. pain, 60 tablets, no refills.  DISCHARGE DIAGNOSES:  Lumbar stenosis and lumbar radiculopathy.     Hewitt Shorts, M.D.     RWN/MEDQ  D:  06/15/2010  T:  06/15/2010  Job:  [id number]  Electronically Signed by Shirlean Kelly M.D. on 06/19/2010 09:20:36 AM

## 2010-06-22 LAB — DIFFERENTIAL
Basophils Relative: 0 % (ref 0–1)
Eosinophils Absolute: 0.3 10*3/uL (ref 0.0–0.7)
Eosinophils Relative: 3 % (ref 0–5)
Lymphs Abs: 1.5 10*3/uL (ref 0.7–4.0)
Monocytes Relative: 7 % (ref 3–12)
Neutrophils Relative %: 73 % (ref 43–77)

## 2010-06-22 LAB — LIPASE, BLOOD: Lipase: 65 U/L — ABNORMAL HIGH (ref 11–59)

## 2010-06-22 LAB — POCT CARDIAC MARKERS: CKMB, poc: 1.4 ng/mL (ref 1.0–8.0)

## 2010-06-22 LAB — URINALYSIS, ROUTINE W REFLEX MICROSCOPIC
Glucose, UA: NEGATIVE mg/dL
Ketones, ur: 15 mg/dL — AB
Nitrite: POSITIVE — AB
Protein, ur: 100 mg/dL — AB
pH: 6.5 (ref 5.0–8.0)

## 2010-06-22 LAB — HEPATIC FUNCTION PANEL
ALT: 42 U/L (ref 0–53)
Bilirubin, Direct: 0.5 mg/dL — ABNORMAL HIGH (ref 0.0–0.3)
Indirect Bilirubin: 2.8 mg/dL — ABNORMAL HIGH (ref 0.3–0.9)
Total Protein: 8.1 g/dL (ref 6.0–8.3)

## 2010-06-22 LAB — POCT I-STAT, CHEM 8
Calcium, Ion: 0.98 mmol/L — ABNORMAL LOW (ref 1.12–1.32)
Creatinine, Ser: 1 mg/dL (ref 0.4–1.5)
Glucose, Bld: 133 mg/dL — ABNORMAL HIGH (ref 70–99)
HCT: 47 % (ref 39.0–52.0)
Hemoglobin: 16 g/dL (ref 13.0–17.0)
Potassium: 2.9 mEq/L — ABNORMAL LOW (ref 3.5–5.1)
TCO2: 29 mmol/L (ref 0–100)

## 2010-06-22 LAB — URINE MICROSCOPIC-ADD ON

## 2010-06-22 LAB — CBC
HCT: 42.9 % (ref 39.0–52.0)
MCHC: 33.4 g/dL (ref 30.0–36.0)
MCV: 97.8 fL (ref 78.0–100.0)
RBC: 4.38 MIL/uL (ref 4.22–5.81)

## 2010-06-22 LAB — CULTURE, BLOOD (ROUTINE X 2)
Culture: NO GROWTH
Culture: NO GROWTH

## 2010-06-22 LAB — D-DIMER, QUANTITATIVE: D-Dimer, Quant: 1.59 ug/mL-FEU — ABNORMAL HIGH (ref 0.00–0.48)

## 2010-06-22 LAB — RAPID URINE DRUG SCREEN, HOSP PERFORMED
Barbiturates: NOT DETECTED
Opiates: NOT DETECTED

## 2010-06-23 LAB — DIFFERENTIAL
Basophils Absolute: 0 10*3/uL (ref 0.0–0.1)
Lymphocytes Relative: 14 % (ref 12–46)
Lymphs Abs: 1.9 10*3/uL (ref 0.7–4.0)
Monocytes Absolute: 0.9 10*3/uL (ref 0.1–1.0)
Monocytes Relative: 7 % (ref 3–12)
Neutro Abs: 10.3 10*3/uL — ABNORMAL HIGH (ref 1.7–7.7)

## 2010-06-23 LAB — CBC
HCT: 43.9 % (ref 39.0–52.0)
MCHC: 33.4 g/dL (ref 30.0–36.0)
MCV: 97 fL (ref 78.0–100.0)
Platelets: 186 10*3/uL (ref 150–400)
RDW: 15.2 % (ref 11.5–15.5)

## 2010-06-23 LAB — COMPREHENSIVE METABOLIC PANEL
Albumin: 4.4 g/dL (ref 3.5–5.2)
BUN: 15 mg/dL (ref 6–23)
Calcium: 9.4 mg/dL (ref 8.4–10.5)
Creatinine, Ser: 1.63 mg/dL — ABNORMAL HIGH (ref 0.4–1.5)
GFR calc Af Amer: 55 mL/min — ABNORMAL LOW (ref >60)
Total Bilirubin: 2.6 mg/dL — ABNORMAL HIGH (ref 0.3–1.2)
Total Protein: 8 g/dL (ref 6.0–8.3)

## 2010-06-23 LAB — RAPID URINE DRUG SCREEN, HOSP PERFORMED: Cocaine: NOT DETECTED

## 2010-06-23 LAB — URINALYSIS, ROUTINE W REFLEX MICROSCOPIC
Glucose, UA: NEGATIVE mg/dL
Nitrite: POSITIVE — AB
Specific Gravity, Urine: 1.04 — ABNORMAL HIGH (ref 1.005–1.030)
pH: 5 (ref 5.0–8.0)

## 2010-06-23 LAB — URINE MICROSCOPIC-ADD ON

## 2010-06-23 LAB — MAGNESIUM: Magnesium: 1.8 mg/dL (ref 1.5–2.5)

## 2010-07-07 ENCOUNTER — Emergency Department (HOSPITAL_COMMUNITY)
Admission: EM | Admit: 2010-07-07 | Discharge: 2010-07-07 | Discharge status: Against Medical Advice | Payer: Medicare Other | Attending: Emergency Medicine | Admitting: Emergency Medicine

## 2010-07-07 DIAGNOSIS — R296 Repeated falls: Secondary | ICD-10-CM | POA: Insufficient documentation

## 2010-07-07 DIAGNOSIS — Z9889 Other specified postprocedural states: Secondary | ICD-10-CM | POA: Insufficient documentation

## 2010-07-07 DIAGNOSIS — M549 Dorsalgia, unspecified: Secondary | ICD-10-CM | POA: Insufficient documentation

## 2010-07-09 ENCOUNTER — Emergency Department (HOSPITAL_COMMUNITY): Payer: Medicare Other

## 2010-07-09 ENCOUNTER — Inpatient Hospital Stay (HOSPITAL_COMMUNITY)
Admission: EM | Admit: 2010-07-09 | Discharge: 2010-07-11 | DRG: 066 | Disposition: A | Discharge status: Home / Self Care | Payer: Medicare Other | Attending: Family Medicine | Admitting: Family Medicine

## 2010-07-09 DIAGNOSIS — R4789 Other speech disturbances: Secondary | ICD-10-CM | POA: Diagnosis present

## 2010-07-09 DIAGNOSIS — M549 Dorsalgia, unspecified: Secondary | ICD-10-CM | POA: Diagnosis present

## 2010-07-09 DIAGNOSIS — E7800 Pure hypercholesterolemia, unspecified: Secondary | ICD-10-CM

## 2010-07-09 DIAGNOSIS — I10 Essential (primary) hypertension: Secondary | ICD-10-CM

## 2010-07-09 DIAGNOSIS — E785 Hyperlipidemia, unspecified: Secondary | ICD-10-CM | POA: Diagnosis present

## 2010-07-09 DIAGNOSIS — F17200 Nicotine dependence, unspecified, uncomplicated: Secondary | ICD-10-CM

## 2010-07-09 DIAGNOSIS — R5381 Other malaise: Secondary | ICD-10-CM | POA: Diagnosis present

## 2010-07-09 DIAGNOSIS — K7010 Alcoholic hepatitis without ascites: Secondary | ICD-10-CM | POA: Diagnosis present

## 2010-07-09 DIAGNOSIS — I6350 Cerebral infarction due to unspecified occlusion or stenosis of unspecified cerebral artery: Principal | ICD-10-CM | POA: Diagnosis present

## 2010-07-09 LAB — CARDIAC PANEL(CRET KIN+CKTOT+MB+TROPI)
CK, MB: 0.7 ng/mL (ref 0.3–4.0)
Relative Index: INVALID (ref 0.0–2.5)
Total CK: 80 U/L (ref 7–232)

## 2010-07-09 LAB — COMPREHENSIVE METABOLIC PANEL
AST: 19 U/L (ref 0–37)
Albumin: 3.7 g/dL (ref 3.5–5.2)
CO2: 28 mEq/L (ref 19–32)
Calcium: 9 mg/dL (ref 8.4–10.5)
Creatinine, Ser: 0.97 mg/dL (ref 0.4–1.5)
GFR calc Af Amer: >60 mL/min (ref >60)
GFR calc non Af Amer: >60 mL/min (ref >60)

## 2010-07-09 LAB — CK TOTAL AND CKMB (NOT AT ARMC)
CK, MB: 0.8 ng/mL (ref 0.3–4.0)
Relative Index: INVALID (ref 0.0–2.5)

## 2010-07-09 LAB — CBC
HCT: 37.5 % — ABNORMAL LOW (ref 39.0–52.0)
Hemoglobin: 12.7 g/dL — ABNORMAL LOW (ref 13.0–17.0)
MCV: 90.4 fL (ref 78.0–100.0)
RBC: 4.15 MIL/uL — ABNORMAL LOW (ref 4.22–5.81)
WBC: 8.8 10*3/uL (ref 4.0–10.5)

## 2010-07-09 LAB — APTT: aPTT: 27 seconds (ref 24–37)

## 2010-07-09 LAB — TROPONIN I: Troponin I: 0.03 ng/mL (ref 0.00–0.06)

## 2010-07-09 LAB — DIFFERENTIAL
Lymphocytes Relative: 32 % (ref 12–46)
Lymphs Abs: 2.8 10*3/uL (ref 0.7–4.0)
Neutrophils Relative %: 54 % (ref 43–77)

## 2010-07-09 LAB — POCT I-STAT, CHEM 8
BUN: 6 mg/dL (ref 6–23)
Chloride: 102 mEq/L (ref 96–112)
Sodium: 141 mEq/L (ref 135–145)

## 2010-07-09 LAB — GLUCOSE, CAPILLARY: Glucose-Capillary: 118 mg/dL — ABNORMAL HIGH (ref 70–99)

## 2010-07-10 ENCOUNTER — Other Ambulatory Visit (HOSPITAL_COMMUNITY): Payer: Medicare Other

## 2010-07-10 ENCOUNTER — Inpatient Hospital Stay (HOSPITAL_COMMUNITY): Payer: Medicare Other

## 2010-07-10 LAB — CARDIAC PANEL(CRET KIN+CKTOT+MB+TROPI)
CK, MB: 0.8 ng/mL (ref 0.3–4.0)
Total CK: 79 U/L (ref 7–232)
Troponin I: 0.02 ng/mL (ref 0.00–0.06)

## 2010-07-10 LAB — CBC
HCT: 37.1 % — ABNORMAL LOW (ref 39.0–52.0)
MCHC: 33.2 g/dL (ref 30.0–36.0)
Platelets: 246 10*3/uL (ref 150–400)
RDW: 13.7 % (ref 11.5–15.5)
WBC: 6.9 10*3/uL (ref 4.0–10.5)

## 2010-07-10 LAB — LIPID PANEL
HDL: 25 mg/dL — ABNORMAL LOW (ref >39)
Triglycerides: 137 mg/dL (ref <150)
VLDL: 27 mg/dL (ref 0–40)

## 2010-07-10 LAB — BASIC METABOLIC PANEL
Calcium: 9.5 mg/dL (ref 8.4–10.5)
GFR calc Af Amer: >60 mL/min (ref >60)
GFR calc non Af Amer: >60 mL/min (ref >60)
Glucose, Bld: 93 mg/dL (ref 70–99)
Potassium: 3.5 mEq/L (ref 3.5–5.1)
Sodium: 139 mEq/L (ref 135–145)

## 2010-07-10 LAB — HEMOGLOBIN A1C
Hgb A1c MFr Bld: 5.8 % — ABNORMAL HIGH (ref <5.7)
Mean Plasma Glucose: 120 mg/dL — ABNORMAL HIGH (ref <117)

## 2010-07-10 MED ORDER — GADOBENATE DIMEGLUMINE 529 MG/ML IV SOLN
15.0000 mL | Freq: Once | INTRAVENOUS | Status: AC
  Administered 2010-07-10: 15 mL via INTRAVENOUS

## 2010-07-11 LAB — COMPREHENSIVE METABOLIC PANEL
ALT: 14 U/L (ref 0–53)
AST: 13 U/L (ref 0–37)
Albumin: 3.5 g/dL (ref 3.5–5.2)
Calcium: 9.2 mg/dL (ref 8.4–10.5)
Chloride: 103 mEq/L (ref 96–112)
Creatinine, Ser: 0.85 mg/dL (ref 0.4–1.5)
GFR calc Af Amer: >60 mL/min (ref >60)
Sodium: 141 mEq/L (ref 135–145)

## 2010-07-17 ENCOUNTER — Emergency Department (HOSPITAL_COMMUNITY): Payer: Medicare Other

## 2010-07-17 ENCOUNTER — Inpatient Hospital Stay (HOSPITAL_COMMUNITY)
Admission: EM | Admit: 2010-07-17 | Discharge: 2010-07-19 | DRG: 439 | Disposition: A | Discharge status: Home / Self Care | Payer: Medicare Other | Attending: Family Medicine | Admitting: Family Medicine

## 2010-07-17 ENCOUNTER — Encounter (HOSPITAL_COMMUNITY): Payer: Self-pay | Admitting: Radiology

## 2010-07-17 DIAGNOSIS — R29898 Other symptoms and signs involving the musculoskeletal system: Secondary | ICD-10-CM | POA: Diagnosis present

## 2010-07-17 DIAGNOSIS — Z7982 Long term (current) use of aspirin: Secondary | ICD-10-CM

## 2010-07-17 DIAGNOSIS — E785 Hyperlipidemia, unspecified: Secondary | ICD-10-CM | POA: Diagnosis present

## 2010-07-17 DIAGNOSIS — Z560 Unemployment, unspecified: Secondary | ICD-10-CM

## 2010-07-17 DIAGNOSIS — K7030 Alcoholic cirrhosis of liver without ascites: Secondary | ICD-10-CM

## 2010-07-17 DIAGNOSIS — G8929 Other chronic pain: Secondary | ICD-10-CM | POA: Diagnosis present

## 2010-07-17 DIAGNOSIS — I69998 Other sequelae following unspecified cerebrovascular disease: Secondary | ICD-10-CM

## 2010-07-17 DIAGNOSIS — I10 Essential (primary) hypertension: Secondary | ICD-10-CM | POA: Diagnosis present

## 2010-07-17 DIAGNOSIS — K859 Acute pancreatitis without necrosis or infection, unspecified: Secondary | ICD-10-CM

## 2010-07-17 DIAGNOSIS — K861 Other chronic pancreatitis: Secondary | ICD-10-CM | POA: Diagnosis present

## 2010-07-17 DIAGNOSIS — I69992 Facial weakness following unspecified cerebrovascular disease: Secondary | ICD-10-CM

## 2010-07-17 DIAGNOSIS — K862 Cyst of pancreas: Secondary | ICD-10-CM | POA: Diagnosis present

## 2010-07-17 DIAGNOSIS — E876 Hypokalemia: Secondary | ICD-10-CM | POA: Diagnosis not present

## 2010-07-17 DIAGNOSIS — M549 Dorsalgia, unspecified: Secondary | ICD-10-CM | POA: Diagnosis present

## 2010-07-17 DIAGNOSIS — F17200 Nicotine dependence, unspecified, uncomplicated: Secondary | ICD-10-CM | POA: Diagnosis present

## 2010-07-17 DIAGNOSIS — F1010 Alcohol abuse, uncomplicated: Secondary | ICD-10-CM | POA: Diagnosis present

## 2010-07-17 HISTORY — DX: Essential (primary) hypertension: I10

## 2010-07-17 LAB — COMPREHENSIVE METABOLIC PANEL
ALT: 18 U/L (ref 0–53)
BUN: 14 mg/dL (ref 6–23)
CO2: 28 mEq/L (ref 19–32)
Calcium: 11.4 mg/dL — ABNORMAL HIGH (ref 8.4–10.5)
Creatinine, Ser: 0.83 mg/dL (ref 0.4–1.5)
GFR calc non Af Amer: >60 mL/min (ref >60)
Glucose, Bld: 186 mg/dL — ABNORMAL HIGH (ref 70–99)

## 2010-07-17 LAB — DIFFERENTIAL
Eosinophils Relative: 0 % (ref 0–5)
Lymphocytes Relative: 11 % — ABNORMAL LOW (ref 12–46)
Monocytes Absolute: 0.5 10*3/uL (ref 0.1–1.0)
Monocytes Relative: 3 % (ref 3–12)
Neutro Abs: 11.4 10*3/uL — ABNORMAL HIGH (ref 1.7–7.7)

## 2010-07-17 LAB — CBC
HCT: 43.4 % (ref 39.0–52.0)
Hemoglobin: 15 g/dL (ref 13.0–17.0)
MCH: 30.7 pg (ref 26.0–34.0)
MCHC: 34.6 g/dL (ref 30.0–36.0)
MCV: 88.9 fL (ref 78.0–100.0)

## 2010-07-17 LAB — LIPASE, BLOOD: Lipase: 871 U/L — ABNORMAL HIGH (ref 11–59)

## 2010-07-17 MED ORDER — IOHEXOL 300 MG/ML  SOLN
100.0000 mL | Freq: Once | INTRAMUSCULAR | Status: AC | PRN
  Administered 2010-07-17: 100 mL via INTRAVENOUS

## 2010-07-18 LAB — RAPID URINE DRUG SCREEN, HOSP PERFORMED
Amphetamines: NOT DETECTED
Barbiturates: NOT DETECTED
Benzodiazepines: NOT DETECTED
Cocaine: NOT DETECTED
Opiates: NOT DETECTED

## 2010-07-18 LAB — BASIC METABOLIC PANEL
BUN: 8 mg/dL (ref 6–23)
Chloride: 99 mEq/L (ref 96–112)
GFR calc Af Amer: >60 mL/min (ref >60)
Potassium: 3.4 mEq/L — ABNORMAL LOW (ref 3.5–5.1)
Sodium: 136 mEq/L (ref 135–145)

## 2010-07-18 LAB — CBC
MCV: 87.8 fL (ref 78.0–100.0)
Platelets: 249 10*3/uL (ref 150–400)
RBC: 4.44 MIL/uL (ref 4.22–5.81)
WBC: 12.8 10*3/uL — ABNORMAL HIGH (ref 4.0–10.5)

## 2010-07-18 NOTE — Discharge Summary (Signed)
[name], [name]               ACCOUNT NO.:  [id number]  MEDICAL RECORD NO.:  [id number]           PATIENT TYPE:  I  LOCATION:  3005                         FACILITY:  [facility]  PHYSICIAN:  Leighton Roach Keerstin Bjelland, M.D.DATE OF BIRTH:  [birth year]  DATE OF ADMISSION:  07/09/2010 DATE OF DISCHARGE:  07/11/2010                              DISCHARGE SUMMARY   PRIMARY CARE PROVIDER:  Demetria Pore, MD, [facility].  DISCHARGE DIAGNOSES: 1. Right corona cerebrovascular accident. 2. Hypertension. 3. Hyperlipidemia. 4. L4-L5 decompression in April 2012. 5. Chronic back pain. 6. Alcoholic hepatitis. 7. History of pancreatitis.  DISCHARGE MEDICATIONS: 1. Aspirin 325 mg p.o. daily. 2. Lisinopril/hydrochlorothiazide 10/12.5 one tablet p.o. daily. 3. Pravastatin 80 mg p.o. every night. 4. Vicodin 5/325 one tablet p.o. q.4 h. p.r.n. back and leg pain.  CONSULTS:  None.  PROCEDURES: 1. CT of head without contrast showing remote lacunar-type basal     ganglia infarcts and periventricular white matter disease.  No     findings for acute hemispheric infarction or intracranial     hemorrhage. 2. MRI/MRA of head with and without contrast showing acute right     corona radiata infarct extending toward posterior limb and right     internal capsule.  No mass effect or hemorrhage.  Very severe     chronic small vessel ischemia of bilateral deep great matter     nuclei.  Minimal evidence of carotid atherosclerosis, only on the     left.  No significant cervical carotid artery stenosis.  Evidence     of moderate right greater than left vertebral artery     atherosclerosis.  Hemodynamically significant stenosis suspected in     the right V1 segment, but preserved distal flow.  ICA siphon     atherosclerosis right greater than left.  Moderate stenosis in the     distal right ICA cavernous segment with preserved distal flow.     Mild to moderate basilar artery atherosclerosis without  associated     stenosis.  Otherwise only minimal evidence of intracranial     atherosclerotic disease.  No other intracranial stenosis and no     major branch occlusion.  LABORATORY DATA:  On admission, The patient's CBC was stable with white count of 8.8, hemoglobin 13.6, and platelets 251.  INR 1.0.  Glucose 118.  CMET unremarkable, 141/3.4/102/28/6/1.0/122.  T-bili 1.1, alk phos 68, AST/ALT 19/12, total protein 6.9, albumin 3.7, calcium 9.0. Hemoglobin A1c 5.8.  Cardiac enzymes negative x3.  Troponin 0.03, 0.02, 0.02.  At the time of discharge, the patient's CMET was unchanged with the exception of creatinine improved to 0.85.  Alcohol level was less than 5 on admission.  Fasting lipid panel showed cholesterol 189, triglycerides 137, HDL 25, and LDL 137.  BRIEF HOSPITAL COURSE:  This is a 50 year old male with known alcohol and tobacco abuse presenting with a 2-day history of left-sided weakness and facial droop consistent with new right corona infarct seen on MRI. 1. Acute CVA:  Although the patient is young at age 14, he is a smoker     with hypertension and hyperlipidemia, no  hypercoagulability or     vasculitis, workup needs to be pursued.  There does not appear to     be any significant carotid stenosis on the right, therefore     Vascular Surgery does not need to be involved.  The patient was     started on aspirin as well as blood pressure and hyperlipidemia     control.  The patient's echo showed no cardiac source of emboli,     normal systolic function with ejection fraction of 60-65%, and     normal wall motion.  Symptoms rapidly improved after admission.  On     the day of discharge, the patient had only mild left-sided     weakness, however, did have a noticeable left-sided facial droop     which continued.  The patient was given smoking cessation     information, at this time was not entered in smoking cessation.     The patient will require home health speech therapy  and outpatient     physical therapy.  Physical therapy will be 4 times a week for 1     week to improve balance, ambulation with a cane, and decrease his     fall risk.  Speech Therapy will work with the patient at home to     increase the patient's clarity of speech and to be certain that     there are no residual difficulties with swallowing. 2. Tobacco abuse:  The patient is given smoking cessation materials     and strongly encouraged to quit. 3. History of alcohol abuse:  The patient's alcohol level was less     than 5 on admission, however CIWA protocol was initiated.  The     patient did not require any Ativan while in-house. 4. Hypertension:  The patient's blood pressures ranged mostly in the     140s-150s on admission.  He was started on an ACE/Dyazide     combination of lisinopril/hydrochlorothiazide.  He will need BMET     at his followup appointment to ensure that his creatinine and     electrolytes are tolerating.  The patient did not become     hypotensive after starting his blood pressure medication. 5. Hyperlipidemia:  The patient with elevated triglycerides and LDL     and low HDL.  The patient was started on statin, Pravachol 80 every     night for secondary prevention. 6. Chronic back pain:  The patient was continued on his home Vicodin     p.r.n.  DISCHARGE INSTRUCTIONS:  The patient was instructed to increase activity slowly and walk with assistance, specifically a cane as per Physical Therapy.  He was also instructed to follow a low-sodium, heart-healthy diet and to stop smoking.  FOLLOWUP APPOINTMENTS:  The patient is to become a new patient with Dr. Fara Boros at [facility]  His new patient appointment is Thursday, Jul 31, 2010, at 2:15 p.m.  DISCHARGE CONDITION:  The patient was discharged home with good family support and 24-hour supervision in stable medical condition.    ______________________________ Demetria Pore,  MD   ______________________________ Leighton Roach Emilianna Barlowe, M.D.    JM/MEDQ  D:  07/14/2010  T:  07/15/2010  Job:  [id number]  Electronically Signed by Demetria Pore MD on 07/16/2010 11:40:52 AM Electronically Signed by Acquanetta Belling M.D. on 07/18/2010 11:28:12 AM

## 2010-07-19 LAB — CBC
MCH: 29.6 pg (ref 26.0–34.0)
MCHC: 33.6 g/dL (ref 30.0–36.0)
MCV: 88.3 fL (ref 78.0–100.0)
Platelets: 243 10*3/uL (ref 150–400)
RDW: 13.2 % (ref 11.5–15.5)

## 2010-07-19 LAB — BASIC METABOLIC PANEL
BUN: 6 mg/dL (ref 6–23)
Calcium: 10.3 mg/dL (ref 8.4–10.5)
Creatinine, Ser: 0.61 mg/dL (ref 0.4–1.5)
GFR calc Af Amer: >60 mL/min (ref >60)

## 2010-07-24 ENCOUNTER — Other Ambulatory Visit: Payer: Self-pay | Admitting: Family Medicine

## 2010-07-24 ENCOUNTER — Encounter: Payer: Self-pay | Admitting: Family Medicine

## 2010-07-24 DIAGNOSIS — E785 Hyperlipidemia, unspecified: Secondary | ICD-10-CM | POA: Insufficient documentation

## 2010-07-24 DIAGNOSIS — M549 Dorsalgia, unspecified: Secondary | ICD-10-CM | POA: Insufficient documentation

## 2010-07-24 DIAGNOSIS — K860 Alcohol-induced chronic pancreatitis: Secondary | ICD-10-CM | POA: Insufficient documentation

## 2010-07-24 DIAGNOSIS — F17200 Nicotine dependence, unspecified, uncomplicated: Secondary | ICD-10-CM | POA: Insufficient documentation

## 2010-07-24 DIAGNOSIS — G8929 Other chronic pain: Secondary | ICD-10-CM | POA: Insufficient documentation

## 2010-07-24 DIAGNOSIS — I10 Essential (primary) hypertension: Secondary | ICD-10-CM | POA: Insufficient documentation

## 2010-07-24 DIAGNOSIS — K859 Acute pancreatitis without necrosis or infection, unspecified: Secondary | ICD-10-CM | POA: Insufficient documentation

## 2010-07-24 DIAGNOSIS — I639 Cerebral infarction, unspecified: Secondary | ICD-10-CM | POA: Insufficient documentation

## 2010-07-24 MED ORDER — PRAVASTATIN SODIUM 80 MG PO TABS: 80.0000 mg | ORAL_TABLET | Freq: Every evening | ORAL | Status: DC

## 2010-07-24 MED ORDER — AMLODIPINE BESYLATE 10 MG PO TABS: 10.0000 mg | ORAL_TABLET | Freq: Every day | ORAL | Status: DC

## 2010-07-24 MED ORDER — LISINOPRIL-HYDROCHLOROTHIAZIDE 10-12.5 MG PO TABS: 1.0000 | ORAL_TABLET | Freq: Every day | ORAL | Status: DC

## 2010-07-24 MED ORDER — ASPIRIN EC 325 MG PO TBEC: 325.0000 mg | DELAYED_RELEASE_TABLET | Freq: Every day | ORAL | Status: AC

## 2010-07-24 MED ORDER — HYDROCODONE-ACETAMINOPHEN 5-325 MG PO TABS: 1.0000 | ORAL_TABLET | ORAL | Status: AC | PRN

## 2010-07-24 MED ORDER — PANTOPRAZOLE SODIUM 40 MG PO TBEC: 40.0000 mg | DELAYED_RELEASE_TABLET | Freq: Every day | ORAL | Status: DC

## 2010-07-29 NOTE — H&P (Signed)
NAMEZAKARI, BATHE               ACCOUNT NO.:  [id number]   MEDICAL RECORD NO.:  [id number]          PATIENT TYPE:  INP   LOCATION:  0103                         FACILITY:  [facility]   PHYSICIAN:  Marcellus Scott, MD     DATE OF BIRTH:  [birth year]   DATE OF ADMISSION:  10/14/2007  DATE OF DISCHARGE:                              HISTORY & PHYSICAL   PRIMARY MEDICAL DOCTOR:  Unassigned.   CHIEF COMPLAINT:  Intractable nausea, vomiting.  Upper abdominal pain.  Shakes and dizziness.   HISTORY OF PRESENT ILLNESS:  Mr. Schmale is a 50 year old African  American male patient with history of chronic heavy alcohol use, tobacco  abuse, chronic low back pain who started having nausea and vomiting 2  days back.  He says he vomited about 4 to 5 times, each day.  The  initial vomitus had some dark-colored emesis but no bright red blood.  Subsequent emeses have been food or drink that he has consumed with no  coffee grounds or blood.  He has mostly not been able to keep any food  or drinks down.  He has associated upper abdominal burning type of  intermittent, nonradiating pain.  Despite these symptoms, the patient  has continued to consume alcohol that he can tolerate.  His last drink  was this morning, which consisted of a drink of gin.   Since yesterday, the patient has been having shakes, which today have  progressively gotten worse, following which he was brought to the  emergency room.  Here he was noted to be in alcohol withdrawal.  He has  received multiple doses of Ativan.  He is feeling slightly better but  continues to have some shakes.  He is also tachycardic and hypertensive.  He claims he has tolerated a burger and a drink that he consumed some  time ago.  The patient denies any other complaints.   PAST MEDICAL HISTORY:  1. Chronic low back pain.  2. Chronic alcoholism.  3. Tobacco abuse, ongoing.  4. History of heart murmur.   PAST SURGICAL HISTORY:  History of 2 or 3 back  surgeries.   PSYCHIATRIC:  History of depression.   ALLERGIES:  MORPHINE CAUSES ITCHING.   MEDICATIONS:  None.   FAMILY HISTORY:  Noncontributory.   SOCIAL HISTORY:  The patient is married.  The patient's spouse is at the  bedside providing some history also.  He is on disability secondary to  his back pain.  He is independent of activities of daily living.  He  drinks a pint of gin per day for the last 5 to 6 years at least if not  more.  The CAGE questionnaire is positive.  He has a 30-pack-year  smoking history but denies any other drug abuse.   REVIEW OF SYSTEMS:  Fourteen systems reviewed and apart from history of  presenting illness is noncontributory.  The patient denies any previous  history of peptic ulcer disease or GI bleeds.  He has no constipation or  diarrhea.  No fever.  Denies any chest pain or dyspnea.   PHYSICAL EXAMINATION:  GENERAL:  Mr. Schleyer is a moderately built and  nourished male patient who is anxious and intermittently tremulous of  his extremities.  VITAL SIGNS:  Heart rate is in the 110s to 120s per minute, regular  tachycardia.  Blood pressure is in the 180s over 100s mmHg.  Respiratory  rate 22 per minute, saturating at 100% on room air and afebrile.  HEENT:  Nontraumatic, normocephalic.  Pupils equally reacting to light  and accommodation.  Oral cavity with dry mucosa but no blood or coffee  grounds.  NECK:  Supple.  No JVD or carotid bruit.  LYMPHATICS:  No lymphadenopathy.  RESPIRATORY:  Clear to auscultation.  CARDIOVASCULAR:  First and second heart sounds heard.  Regular,  tachycardic.  No third or fourth heart sounds or murmurs.  ABDOMEN:  Nondistended.  Hernial orifices are normal.  Mild tenderness  in the epigastrium and right upper quadrant.  Liver is palpable 3 cm  below the right costal margin and is firm and nonpulsatile.  There is no  rigidity, guarding or rebound.  Bowel sounds are normally heard.  CENTRAL NERVOUS SYSTEM:  The  patient is awake, alert, oriented x3.  No  focal neurological deficit.  EXTREMITIES:  With no cyanosis, clubbing or edema.  Peripheral pulses  are symmetrically felt.  SKIN:  Without any rashes.   LABORATORY DATA:  Urine drug screen is negative.  Comprehensive  metabolic panel with sodium 138, potassium 2.9, chloride 95, bicarb 27,  glucose 99, BUN 2, creatinine 0.79, bilirubin 1.9, alkaline phosphatase  98, AST 228, ALT 169, total protein 7.9, albumin 4.7, blood alcohol  level 107, blood acetaminophen level less than 10.  CBC with hemoglobin  14.5, hematocrit 42.7, white blood cell 8.7, platelets 126.  Ultrasound  of the abdomen:  Mild hepatomegaly with fatty infiltration of the liver.  Otherwise negative.   ASSESSMENT AND PLAN:  1. Alcohol withdrawal/delirium tremens.  We will admit the patient to      the intensive care unit.  We will make him NPO.  He is already      receiving banana bag with multivitamins.  We will place him on an      IV Ativan drip and monitor closely.  If there is respiratory      compromise he will require ventilatory support.  The pulmonary      critical care team has been alerted.  2. Intractable nausea, vomiting and abdominal pain.  ? secondary to      alcoholic gastritis with associated ? upper GI bleed.  Other      differential is alcoholic hepatitis.  We will check emesis for      occult blood.  We will also check his lipase and a KUB.  We will      monitor H&H every 6 hours.  We will make him NPO, hydrate with IV      fluids, and place him on IV proton pump inhibitors.  3. Hypertension secondary to problem #1.  For clonidine patch and      management per problem #1.  4. Tachycardia secondary to problem #1.  5. Tobacco abuse.  For counseling and nicotine patch.  6. Alcoholic hepatitis.  To monitor LFTs.  7. Thrombocytopenia secondary to alcohol abuse.  We will check vitamin      B12 levels.  8. Hypokalemia.  We will replete and monitor potassium  levels, and we      will also check magnesium levels.      [employer],  MD  Electronically Signed     AH/MEDQ  D:  10/14/2007  T:  10/14/2007  Job:  [id number]

## 2010-07-29 NOTE — H&P (Signed)
[name], [name]               ACCOUNT NO.:  [id number]   MEDICAL RECORD NO.:  [id number]          PATIENT TYPE:  EMS   LOCATION:  MAJO                         FACILITY:  [facility]   PHYSICIAN:  Eduard Clos, MDDATE OF BIRTH:  1961-01-06   DATE OF ADMISSION:  08/29/2007  DATE OF DISCHARGE:                              HISTORY & PHYSICAL   CHIEF COMPLAINT:  Chest pain.   HISTORY OF PRESENT ILLNESS:  A 50 year old male with no significant past  medical history presented with complaints of chest pain.  The patient  started experiencing chest pain since last night which was sudden in  onset that was retrosternal, nonradiating, present even at rest, not  related to exertion, lasting 4-5 minutes each time.  The patient denies  any associated shortness of breath, any palpitations, nausea, vomiting,  diaphoresis.  In the ER, the patient had a CT scan of the chest which  was negative for any pulmonary embolism.  The patient is admitted for  further evaluation.  Denies any associated abdominal pain, nausea,  vomiting, diarrhea, fever, chills, headache, dizziness or loss of  consciousness.   PAST MEDICAL HISTORY:  Low back pain.   PAST SURGICAL HISTORY:  Surgeries for low back pain.   MEDICATIONS PRIOR TO ADMISSION:  None.   SOCIAL HISTORY:  The patient lives with his wife.  Smokes cigarettes,  drinks alcohol daily.  Denies any drug abuse.  The patient has been  advised to quit smoking and drinking alcohol.   ALLERGIES:  Morphine.   FAMILY HISTORY:  Nothing contributory.   REVIEW OF SYSTEMS:  As per history of present illness.  Nothing else  significant.   PHYSICAL EXAMINATION:  GENERAL:  Patient examined at bedside.  Not in  acute distress.  VITAL SIGNS:  Blood pressure 180/97, pulse 102 minute, temperature 98.6,  respirations 18, O2 saturation 98%.  HEENT:  Nonicteric, no pallor.  CHEST:  Bilateral air and depressant, mild expiratory wheeze heard.  No  crepitation.  HEART:  S1, S2 heard.  ABDOMEN:  Soft, nontender.  Bowel sounds heard.  No guarding, no  rigidity.  CNS:  The patient is awake, alert, oriented to time, place and person.  Moves upper and lower extremities 5/5.  EXTREMITIES:  Peripheral pulses felt.  No edema.   LABORATORY DATA:  CBC:  WBC is 8.9, hemoglobin 15.3, hematocrit 45,  platelets 160, neutrophils 48%.  Basic metabolic panel:  Sodium 136,  potassium 3.6, chloride 98, glucose 109, BUN 6, creatinine 1.  CK MB 2,  troponin I less than 0.05.  CT scan of the chest negative for pulmonary  embolism or acute cardiopulmonary disease.  Chest x-ray:  No acute  cardiopulmonary disease.  EKG:  Normal sinus rhythm with sinus  tachycardia.   ASSESSMENT:  1. Chest pain, rule out ACS.  2. Chronic alcoholism with impending delirium tremens.  3. Ongoing tobacco abuse.  4. History of chronic low back pain.   PLAN:  Admit patient to telemetry, will cycle cardiac markers.  Will  place the patient on alcohol withdrawal protocol.  Also, place patient  on metoprolol  for rate control and anxiety hypertensives.  Will advise  the patient to quit smoking and drinking.      Eduard Clos, MD  Electronically Signed     ANK/MEDQ  D:  08/29/2007  T:  08/29/2007  Job:  [id number]

## 2010-07-29 NOTE — Discharge Summary (Signed)
NAMEANTRELL, [name]               ACCOUNT NO.:  [id number]   MEDICAL RECORD NO.:  [id number]          PATIENT TYPE:  INP   LOCATION:  1520                         [facility]ACILITY:  [facility]   PHYSICIAN:  Marcellus Scott, MD     DATE OF BIRTH:  Aug 09, 1960   DATE OF ADMISSION:  10/14/2007  DATE OF DISCHARGE:  10/18/2007                               DISCHARGE SUMMARY   Signed out Against Medical Advise.   PRIMARY MEDICAL DOCTOR:  Unassigned.   DISCHARGE DIAGNOSES:  1. Alcohol withdrawal/delirium tremens.  2. Hypertension and tachycardia secondary to problem #1.  3. Alcoholic hepatitis.  4. Hypokalemia - repleted.  5. Chronic anemia.  6. Thrombocytopenia.  7. Tobacco abuse.  8. Alcohol abuse.  9. Nausea, vomiting, abdominal pain/alcoholic gastritis - symptoms      resolved.  10.Question mild pancreatitis.   DISCHARGE MEDICATIONS:  1. Ativan 1 mg tablet, 1 p.o. q.i.d. for 1 day, then 1 p.o. t.i.d. for      1 day, then 1 p.o. b.i.d. for 1 day, then 1 p.o. daily for one day,      then discontinue.  2. Thiamine 100 mg p.o. daily.   PROCEDURES:  1. Abdominal x-ray on July 31 with nonspecific bowel gas pattern.  2. Ultrasound of the abdomen on July 31.  Impression:  Mild      hepatomegaly with fatty infiltration of the liver.  Examination is      otherwise negative.   PERTINENT LAB DATA:  Fecal lactoferrin negative.  Comprehensive  metabolic panel on August 3 with normal BMET and hepatic panel with  total protein 5.8, albumin 3.3, bilirubin 1.5, alkaline phosphatase 81,  AST 76, ALT 81.  Calcium is 9.2.  Complete blood count with hemoglobin  11.2, hematocrit 33.6, white blood cells 6.3, platelets 103,000.  Clostridium difficile toxin negative, lipase 85 on August 1.  Fecal  occult blood testing was negative.  Serum folic acid was greater than  20, B12 is 856, TSH 0.884, lipid panel with cholesterol of 278,  triglyceride 42, HDL 108, LDL 162, VLDL 8.  INR is 0.9.  Urine drug  screen is  negative.  Blood alcohol level on admission was 107.  Acetaminophen less than 10.   CONSULTATIONS:  None.   HOSPITAL COURSE AND PATIENT DISPOSITION:  Please refer to the history  and physical note for initial admission details.  In summary, [name]  is a 50 year old African American male patient with history of chronic  heavy alcohol abuse, tobacco abuse, chronic low back pain, who presented  with a 2-day history of nausea, vomiting, ? coffee ground emesis, and  with extensive shaking on the morning of admission.  Evaluation in the  emergency room revealed that the patient was in frank alcohol withdrawal  and delirium tremens.  He was, therefore, admitted to the ICU for  further evaluation and management.  1. Alcohol withdrawal/ delirium tremens.  The patient was admitted to      the intensive care unit.  He actually was intoxicated with a blood      alcohol level as indicated above.  He  was placed on Ativan drip at      1 mg per hour.  He was provided with IV multivitamins.  His      respiratory status was closely monitored.  He did not require any      ventilatory support.  With these measures, the patient did fairly      well with improvement in his tremulousness, hypertension, and      tachycardia.  However, in 24 hours after admission, the patient      pulled out his IV lines and telemetry, and threatened to leave      against medical advice.  The night physician spoke with him in the      presence of the spouse, and calmed him down, and he agreed to stay      and get further treatment.  Since then, his Ativan was changed to      IV pushes/ p.o. taper, and he was transferred to the medical floor      yesterday.  Today, the patient again, since morning, has been      threatening to leave against medical advice.  I spoke with the      patient in the presence of his wife and one other family member in      great detail.  The patient indicates that, no matter what, he would       like to leave today.  One reason is that he wants to smoke, but      even otherwise, he indicates that he does not want to stay.  I have      explained in great detail, regarding the risks of leaving before      his detoxification is complete, which may include and is not      limited to dangerous and life-threatening cardiac arrhythmias.  He      also has the potential possibility of death because of this.  He      clearly understands and is coherent of all this, but refuses to      stay.  His wife has made the effort to speak with him and convince      him to stay, to no avail.  At this time, the patient will probably      sign out against medical advice.  To try and take him through the      detox, will provide him with a short course of Ativan as an      outpatient.  I have clearly explained to the patient and his wife      that, if he decides to consume alcohol again, he is not to take      these medications because it will be an additive effect, which may      lead to sedation and even death.  They clearly understand, but he      still wants to leave.  Also, the patient will be provided with a      short course of multivitamins.  He has been advised to seek      counseling as an outpatient with sources like Alcoholics Anonymous.      Unsure if he will follow up on this.  2. Intractable nausea, vomiting, and questionable upper GI bleed.      This was probably related to alcoholic gastritis.  He was made NPO      initially and placed on proton pump inhibitor.  His H&H was  followed closely, and there was no significant drop.  Since then,      he has improved.  His diet has been advanced, and he is tolerating      the same.  3. Mild acute pancreatitis.  Again, probably secondary to alcohol.      Clinically, he has done well with no abdominal pain and tolerating      oral diet.  4. Hypertension and tachycardia secondary to alcohol withdrawal.      Again, improved.  5. Alcoholic  hepatitis.  The patient is advised to follow up with an      M.D. regarding his LFT followup.  6. Hypokalemia, repleted.  7. Tobacco abuse.  The patient has been counseled regarding cessation      and has on a patch, but he insists he will go and smoke, and      refuses the patch.   The patient had initially requested to see the psychiatrist, but now  says that he does not even remember telling me that.  He refuses that  too.   At this time, it looks like the patient will sign himself against  medical advice despite all the advice provided by the dictator.  However, if he chooses to stay, then we will continue tapering his  Ativan, and he should be stable for discharge in the next 3 to 4 days.      Marcellus Scott, MD  Electronically Signed     AH/MEDQ  D:  10/18/2007  T:  10/18/2007  Job:  [id number]

## 2010-07-30 NOTE — Discharge Summary (Signed)
[name], [name]               ACCOUNT NO.:  [id number]  MEDICAL RECORD NO.:  [id number]           PATIENT TYPE:  I  LOCATION:  3730                         FACILITY:  [facility]  PHYSICIAN:  Nestor Ramp, MD        DATE OF BIRTH:  [birth year]  DATE OF ADMISSION:  07/17/2010 DATE OF DISCHARGE:  07/19/2010                              DISCHARGE SUMMARY   PRIMARY CARE PROVIDER:  Demetria Pore, MD at [facility].  DISCHARGE DIAGNOSES: 1. Acute on chronic pancreatitis. 2. Alcohol abuse. 3. Tobacco use. 4. Hypertension. 5. Hyperlipidemia. 6. History of right corona cerebrovascular accident. 7. Chronic back pain.  DISCHARGE MEDICATIONS: 1. Amlodipine 10 mg p.o. daily. 2. Aspirin 325 mg p.o. daily. 3. Multivitamin. 4. Protonix 40 mg p.o. daily. 5. Hydrocodone/acetaminophen 5/325 one tab p.o. q.4 hours p.r.n. pain. 6. Lisinopril/HCTZ 10/12.5 mg p.o. daily. 7. Pravastatin 80 mg p.o. at bedtime.  Things that the patient was informed to stop taking at discharge include alcohol.  CONSULTS:  None.  PROCEDURES:  CT abdomen, pelvis, showing acute on chronic pancreatitis with multiple small pseudocysts in the pancreatic head, neck, and uncinate process, small amount of free fluid in the abdomen and pelvis.  LABS:  On the day of admission, the patient's CBC showed a white count of 13.3, hemoglobin of 15.0, platelets of 258, with 86% neutrophils and an ANC of 11.4.  CMET on admission was within normal limits with the exception of a slightly elevated calcium at 11.8, creatinine was 0.83. Lipase was elevated on admission to 871.  Fecal occult blood was negative.  Urine drug screen was negative.  On the day of discharge, the patient's CBC remained unchanged with white blood cell count of 12.1, hemoglobin of 13.9, platelets of 243.  BMET was also within normal limits with a creatinine of 0.61, calcium had normalized to 10.3.  BRIEF HOSPITAL COURSE:  This is a 50 year old  male with recent admission for CVA, presenting with sudden abdominal pain, similar to pancreatitis where as he has had in the past.  1. Pancreatitis.  The patient was initially made n.p.o. and progressed     to clear liquids.  He continued having abdominal pain throughout     his first day of hospitalization.  He was quickly transitioned to a     full p.o. diet which he was tolerating well on the morning of     discharge.  He remained afebrile with stable vital signs throughout     this hospitalization.  The patient has known history of alcohol use     which was likely a contributing factor to this acute pancreatitis     flare, however, he does have known chronic pancreatitis.  There are     multiple small pseudocysts in his pancreatic head, neck, and     uncinate process.  The patient was tolerating p.o. pain control and     full p.o. diet on the day of discharge. 2. Alcohol abuse.  The patient was counseled on abstinence from     alcohol and the importance in order for his pancreatitis to  resolve.  He was placed on CIWA protocol, however, required no     Ativan throughout his hospitalization. 3. Hypertension.  The patient's home blood pressure regimen was     modified.  Norvasc was increased to 10 mg daily.  The patient was     also on lisinopril 10 and HCTZ 12.5.  Blood pressures had improved,     ranging mostly in the 130s-150s prior to discharge, likely blood     pressure medications will need to be increased on an outpatient     basis once his acute pancreatitis flare has resolved. 4. Tobacco abuse.  The patient smokes approximately 2 packs per day,     required a nicotine patch while in-house.  Smoking cessation     education was given.  The patient is not willing to stop using     tobacco at this time. 5. Hyperlipidemia.  The patient's home statin was initially held until     tolerating better p.o.  He was discharged home on his pravastatin. 6. Chronic back pain.  The  patient's home Vicodin was initially held     as the patient was being treated with IV Dilaudid, however, the     night prior to discharge, he was able to tolerate Vicodin for his     pain and Dilaudid PCA was stopped greater than 24 hours prior to     discharge.  PATIENT INSTRUCTIONS:  The patient was instructed to increase activity slowly, continue with a low-sodium, heart-healthy diet without any alcohol in his diet.  He was also advised to stop smoking and again informed to stop using alcohol.  FOLLOWUP APPOINTMENTS:  The patient is to follow up with Dr. Fara Boros at [facility] on Thursday, Jul 31, 2010, at 2:15 p.m.  DISCHARGE CONDITION:  The patient was discharged home in stable medical condition with close followup and the knowledge that any alcohol use in the setting of acute pancreatitis will likely worsen his flare.    ______________________________ Demetria Pore, MD   ______________________________ Nestor Ramp, MD    JM/MEDQ  D:  07/21/2010  T:  07/21/2010  Job:  [id number]  Electronically Signed by Demetria Pore MD on 07/24/2010 02:24:03 PM Electronically Signed by Denny Levy MD on 07/30/2010 04:36:35 PM

## 2010-07-31 ENCOUNTER — Encounter: Payer: Self-pay | Admitting: Family Medicine

## 2010-07-31 ENCOUNTER — Ambulatory Visit (INDEPENDENT_AMBULATORY_CARE_PROVIDER_SITE_OTHER): Payer: Medicare Other | Admitting: Family Medicine

## 2010-07-31 VITALS — BP 109/72 | HR 90 | Wt 159.0 lb

## 2010-07-31 DIAGNOSIS — N529 Male erectile dysfunction, unspecified: Secondary | ICD-10-CM

## 2010-07-31 DIAGNOSIS — I10 Essential (primary) hypertension: Secondary | ICD-10-CM

## 2010-07-31 DIAGNOSIS — I6350 Cerebral infarction due to unspecified occlusion or stenosis of unspecified cerebral artery: Secondary | ICD-10-CM

## 2010-07-31 DIAGNOSIS — I639 Cerebral infarction, unspecified: Secondary | ICD-10-CM

## 2010-07-31 DIAGNOSIS — R300 Dysuria: Secondary | ICD-10-CM

## 2010-07-31 DIAGNOSIS — N3941 Urge incontinence: Secondary | ICD-10-CM

## 2010-07-31 LAB — POCT URINALYSIS DIPSTICK
Leukocytes, UA: NEGATIVE
Protein, UA: NEGATIVE
Urobilinogen, UA: 0.2
pH, UA: 5.5

## 2010-07-31 NOTE — Patient Instructions (Addendum)
It was great to see you in clinic and not the hospital! The good news is that you do not have a urinary tract infection.  Your urgency may be from the foley in the hospital causing some irritation.  We will keep monitoring it.  Please call if it gets worse, you start seeing blood in your urine, or if you start having difficulty starting to urinate or maintaining a good stream of urine. For your problem with erections we can stop one of your blood pressure medicines, amlodipine, since your blood pressure is a little low AND it's known to have a side effect of erectile dysfunction.    Come back and see me in 4 weeks so we can check your blood pressure and see if your urgency and erection difficulties are any better.  Come sooner if you start having any other problems!

## 2010-08-01 ENCOUNTER — Encounter: Payer: Self-pay | Admitting: Family Medicine

## 2010-08-01 DIAGNOSIS — N529 Male erectile dysfunction, unspecified: Secondary | ICD-10-CM | POA: Insufficient documentation

## 2010-08-01 DIAGNOSIS — N3941 Urge incontinence: Secondary | ICD-10-CM | POA: Insufficient documentation

## 2010-08-01 NOTE — Discharge Summary (Signed)
[name], [name]               ACCOUNT NO.:  [id number]   MEDICAL RECORD NO.:  [id number]          PATIENT TYPE:  INP   LOCATION:  3729                         FACILITY:  [facility]   PHYSICIAN:  Michelene Gardener, MD    DATE OF BIRTH:  1960-12-11   DATE OF ADMISSION:  08/29/2007  DATE OF DISCHARGE:  08/30/2007                               DISCHARGE SUMMARY   DISCHARGE DIAGNOSES:  1. Atypical chest pain, which is noncardiac.  2. Alcohol withdrawal.  3. History of low back pain.   DISCHARGE MEDICATIONS:  None.   CONSULTATIONS:  None.   RADIOLOGY STUDIES:  1. Chest x-ray on August 29, 2007, showed no acute findings.  2. CT angiography showed no evidence of PE and showed mild      emphysematous changes.   COURSE OF HOSPITALIZATION:  This is a 50 year old male who was admitted  to the hospital with atypical chest pain.  The patient was monitored in  Telemetry.  Three sets of troponin and cardiac enzymes were done, and  they came to be negative.  The patient had chest x-ray that came to be  normal.  CT angiography was done and again it was normal without  evidence of PE or dissection.  The patient's pain is mostly  musculoskeletal where the patient feels tenderness to palpation.  On the  time of discharge, the patient is totally back to baseline.  No evidence  of chest pain.  He was advised to follow with [facility] if his pain  recur.   The patient developed mild withdrawal symptoms in the hospital, which  was treated with p.r.n. Ativan in addition to IV fluids, multivitamin,  folic acid, and thiamine.   At the time of discharge, the patient is back to his baseline, was  advised to follow with [facility] for further evaluation.   Total assessment time is 40 minutes.      Michelene Gardener, MD  Electronically Signed     NAE/MEDQ  D:  01/18/2008  T:  01/19/2008  Job:  [id number]

## 2010-08-01 NOTE — Assessment & Plan Note (Signed)
Will d/c amlodipine first as BPs are on the low side and this has the highest likelihood of being the medication at fault if this is medication-induced. Could consider adding an anti-depressant at next visit if still having difficulties since pt w/ known h/o depression and s/p stroke. Would consider wellbutrin first (would also help w/ smoking cessation and least likely to decrease sex drive) vs SSRI vs TCA (have to be careful of interactions).

## 2010-08-01 NOTE — Discharge Summary (Signed)
[name], WARF               ACCOUNT NO.:  [id number]   MEDICAL RECORD NO.:  [id number]          PATIENT TYPE:  INP   LOCATION:  2014                         FACILITY:  [facility]   PHYSICIAN:  Alvester Morin, M.D.  DATE OF BIRTH:  [birth year]   DATE OF ADMISSION:  11/15/2007  DATE OF DISCHARGE:  11/16/2007                               DISCHARGE SUMMARY   The patient signed out against medical advice.   PRIMARY CARE PHYSICIAN:  Unassigned.   CONSULTATIONS:  None.   DISCHARGE DIAGNOSES:  1. Alcohol withdrawal/seizures.  2. Metabolic acidosis secondary to alcohol abuse/withdrawal  3. Abdominal pain, vomiting, possible alcoholic gastritis - symptoms      resolved.  4. Chest pain secondar to GI problems.  5. Transaminitis -secondary to alcohol abuse  6. Hypertension, tachycardia secondary to alcohol withdrawal/seizures.  7. Alcoholic hepatitis.  8. Alcohol abuse.  9. Tobacco abuse.   DISCHARGE MEDICATIONS:  1. Thiamine 100 mg p.o. daily.  2. Ativan 1 mg p.o. daily.   DISPOSITION AND FOLLOWUP:  The patient has compliance issue and is not  regular at following up with the clinic and also has history of multiple  AMAs, signing against medical advice.  Smoking cessation consult and  alcohol abstinence issues were discussed with him, which was also done  in the past but he has history of multiple relapses.  Followup  appointment was not scheduled since the patient left AMA.   PROCEDURES PERFORMED:  1. Acute abdominal series, November 16, 2007.  No acute      cardiopulmonary process, no free air or bowel obstruction noticed.  2. CT of the head without contrast.  No acute intracranial findings,      remote right basal ganglia lacunar infarction.   BRIEF ADMITTING HISTORY AND PHYSICAL:  The patient is a 50 year old male  with past medical history of alcohol and tobacco abuse for more than 20  years, who presented to ED this morning with an episode of seizure.  As  per his wife  who provided most of the history, the patient has been  drinking alcohol this morning and soon after began to act strange and  became uncommunicative, alternatively somnolent, agitated both by  producing incomprehensible sounds (moaning and crying).  He also  appeared contracted and his arms flexed to his chest.  The episode  lasted about 5 minutes.  EMS came and found that he also bit his tongue  and was bleeding from the mouth.  After the episode, the patient was  confused and minimally unresponsive.  When the patient woke up, he  reported fevers, chills, abdominal pain for few days associated with  nausea, vomiting, diarrhea, non-radiating intermittent chest pain, 35-  pound weight loss, and some shortness of breath.  No weakness.  No  syncope.  No urinary complaints.   VITAL SIGNS:  Upon admission, temperature 99, blood pressure 168/89,  pulse 147, respirations 20, and saturating at 98% on room air.  GENERAL:  The patient appeared lethargic and drowsy.  No evidence of  traumatic injury.  HEENT:  Pupils 3 mm in diameter, nonreactive to  light.  Extraocular  movements intact.  NECK:  There were no neck masses.  Palpation of the neck did not appear  to be painful and the neck was fully mobile with no apparent stiffness  or limited range of motion.  LUNGS:  Clear to auscultation bilaterally.  HEART:  Rhythm was regular with tachycardia in the 140s range.  No  murmurs.  ABDOMEN:  Slightly hard and distended, diffusely tender without palpable  masses.  EXTREMITIES:  Warm to touch, well perfused, and he moves all 4 equally  well, but very very slow.  NEUROLOGIC:  There was significant resting tremor noticed.   LABORATORY DATA:  Sodium 138, potassium 3.4, chloride 99, bicarb 17, BUN  2, creatinine 0.94, and glucose 127.  White blood cells 9.3, hemoglobin  13.6, platelets 113, and MCV 93.4.  Ethanol 41.  UDS negative.  UA  negative.  Anion gap 22, bilirubin 1.2, alkaline phosphatase 82,  AST  120, ALT 63, protein 7.2, albumin 4.0, and calcium 9.3.   HOSPITAL COURSE:  1. Alcohol withdrawal/seizure episodes.  The patient was admitted to      step-down unit and was intoxicated with alcohol level of 41.  Given      his past medical history, seizure due to alcohol withdrawal was      likely.  He was started on alcohol withdrawal protocol and gave him      an Ativan drip at 1 mg to 2  mg per hour, every 2 hours as needed,      and we also gave him thiamine, folic acid, and we started him on      fluids.  He was breathing well on admission and did not require      respiratory support.  He did well on the above regimen (improved      tachycardia and hypertension, no confusion, less lethargic, and no      new seizures).  He was discharged from telemetry and has left the      next day AMA.  Other etiology of seizures were ruled out (no trauma      since negative CT, no bleed, no infection such as meningitis or      encephalitis since PE findings are negative and CT of the head is      negative as well, no significant electrolyte disturbances such as      hyponatremia and hypoglycemia).  2. Metabolic acidosis.  Etiology was unclear.  Initially, we checked      lactic acid, urine drug screen, acetaminophen and salicylates level      and were all within normal limits except slightly high alcohol      levels.  Metabolic acidosis was likely secondary to abrupt and      continuously decrease in alcohol consumption which in case of      longtime alcohol use that can result in the patient's state      worsened by subsequent vomiting and dehydration, resulting in      acidosis.  With the above treatment, acidosis improved.  The      patient was found to be slightly hypokalemic, potassium 3.0 and KCl      was given with subsequent improvement.  3. Abdominal pain, nausea, vomiting, and diarrhea.  This was likely      secondary to alcoholic gastritis.  We started him on Protonix and      he  responded well (improving).  Initially, he was made n.p.o. but  subsequently, he improved and started regular diet, tolerating it      well.  We suggested a colonoscopy and EGD as an outpatient, but      unsure if he will follow up due to his noncompliance issues.  4. Chest pain.  We placed the patient on telemetry and monitored his      tachycardia and hypertension with the treatment mentioned in #1.      He responded very well and both the hypertension and heart rate      were trending down.  When the patient left, he had no chest pain,      no shortness of breath, no dyspnea on exertion, and no diaphoresis.  5. Transaminitis, likely secondary to alcohol abuse.  Classic AST/ALT      equals 2:1 ratio and it is a chronic problem with the patient.  6. Hypertension/tachycardia.  Chronic problem is already mentioned and      likely secondary to alcohol abuse.  On admission, heart rate of 147      and blood pressure 168/89.  With treatment of fluids and Ativan,      condition improved with heart rate of 111 and blood pressure      143/76.  7. Alcohol abuse.  We discussed alcohol abstinence with the patient      and Alcoholic Anonymous meetings, however, we are not sure if he      will followup since he has history of multiple relapses and      noncompliance issues.  8. Tobacco abuse.  Smoking cessation discussion carried out.  Not sure      if the patient will follow.  We provided nicotine patch during the      hospital stay and he was still requesting to go out to smoke.   VITALS ON DISCHARGE:  Temperature 98.2, blood pressure 140/89, pulse  102, respirations 18, and oxygen saturation 99% on room air.   LABORATORY DATA:  Sodium 139, potassium 3.7, chloride 103, bicarb 25,  BUN less than 1, creatinine 0.78, and glucose 88.  Magnesium 1.6, lactic  acid 1.8, bilirubin 2.0, alkaline phosphatase 86, AST 98, ALT 55,  protein 6.0, albumin 3.9, and calcium is 0.8.      Mliss Sax, MD   Electronically Signed      Alvester Morin, M.D.  Electronically Signed    IM/MEDQ  D:  11/21/2007  T:  11/22/2007  Job:  [id number]

## 2010-08-01 NOTE — Assessment & Plan Note (Addendum)
BP borderline low (109/72); will d/c amlodipine at this time 2/2 possible side effect of erectile dysfunction.  Continue lisinopril/HCTZ.  Will have him come back in 2-4 weeks for BP recheck.  If elevated at that time, but ED improved, could increase lisinopril/HCTZ dose (only 10/12.5 at this time). No hypotensive episodes, no dizziness, CP, tiredness, blurry vision.

## 2010-08-01 NOTE — Progress Notes (Signed)
  Subjective:    Patient ID: [name], male    DOB: Aug 18, 1960, 50 y.o.   MRN: [id number]  HPI Pt comes in today for new pt visit/hospital follow up from L corona CVA (d/c 07/11/10) and acute on chronic alcoholic pancreatitis (d/c 07/21/10).  Only having small amount of facial weakness from stroke and no abdominal pain/n/v from pancreatitis. Has two other issues to discuss today:  1. Erectile Dysfunction: Has been occuring since d/c after hospitalization for stroke. At that time, multiple medications were started, including amlodipine, lisinopril, HCTZ, pravastatin. Amlodipine could be a cause of this ED.  Pt also w/ a h/o depression and recent stroke which can cause an organic depression.  However, pt states that he has interest in sex, just in not able to initiate an erection, or, if one starts, he is not able to maintain or complete it.  This is very frustrating to him and his partner. No other neurological signs or symptoms. No numbness, tingling, weakness in BLE, no bowel or bladder symptoms with exception of urinary urgency, see below. Has never had anything like this happen in the past.  2. Urinary urgency: Has been occuring since d/c from most recent hospitalization for pancreatitis.  Did have a foley cath during that time.  No dysuria, hematuria, foul odor, discoloration, pelvic pain. No fevers, chills, sweats, n/v/d or other systemic signs/symptoms of an infection.  Feels like nothing makes it better or worse, and is about the same since leaving the hospital.  Happens 2-4x/day.  Has had some incontinence associated with it when he is unable to get to the bathroom fast enough.    Review of Systems See HPI.    Objective:   Physical Exam  Vitals reviewed. Constitutional: He appears well-developed. No distress.  Eyes: EOM are normal. Pupils are equal, round, and reactive to light.  Cardiovascular: Normal rate and regular rhythm.   No murmur heard. Pulmonary/Chest: Effort normal and  breath sounds normal. No respiratory distress.  Abdominal: Soft. Bowel sounds are normal. He exhibits no distension. There is no tenderness.  Genitourinary: Rectum normal and prostate normal. Rectal exam shows no mass and no tenderness. Prostate is not enlarged and not tender.  Musculoskeletal: Normal range of motion. He exhibits no edema.  Neurological: He is alert. He has normal strength. No cranial nerve deficit. Coordination and gait normal.  Psychiatric: He has a normal mood and affect. His behavior is normal.          Assessment & Plan:  -UA negative for UTI, prostatitis unlikely 2/2 no prostate tenderness, could be irritation 2/2 foley cath from hospitalization, but was d/c'ed 10 days ago so this is a little far out; no s/s for systemic infection, will monitor. Given red signs to return to clinic or go to ED for. -d/c amlodipine and monitor ED -return in 2-4 weeks for BP check and f/u of urgency and ED

## 2010-08-01 NOTE — Op Note (Signed)
[place]  Patient:    [name], [name] Visit Number: [id number] MRN: [id number]          Service Type: DSU Location: [facility] Attending Physician:  Ronne Binning Dictated by:   Nicki Reaper, M.D. Proc. Date: 12/29/00 Admit Date:  12/29/2000                             Operative Report  PREOPERATIVE DIAGNOSIS:  Mass, left palm.  POSTOPERATIVE DIAGNOSIS:  Mass, left palm.  PROCEDURE:  Excision mass, left palm.  SURGEON:  Nicki Reaper, M.D.  ASSISTANT:  Joaquin Courts, R.N.  ANESTHESIA:  Forearm-based IV regional.  ANESTHESIOLOGIST:  Maren Beach, M.D.  CLINICAL NOTE:  The patient is a 50 year old male with a history of a mass in the palm of his left hand with intermittent drainage.  DESCRIPTION OF PROCEDURE:  The patient was brought to the operating room, where a forearm-based IV regional anesthetic was carried out without difficulty.  He was prepped and draped using Betadine scrub and solution and the left arm free.  A zigzag incision was made, the sinus tract incised with blunt and sharp dissection.  The mass was dissected free and inadvertently opened.  A creamy fluid was immediately apparent, cultures were taken.  The mass was then dissected free, found to go to the palmar fascia.  This was excised and the entire specimen sent to pathology.  The wound was irrigated with a bacitracin-containing saline solution.  The wound was then closed loosely with interrupted 5-0 nylon suture.  A sterile compressive dressing was applied.  The patient tolerated the procedure well and was taken to the recovery room for observation in satisfactory condition.  He is discharged home, to return to the [facility] in one week on Vicodin and Keflex. Dictated by:   Nicki Reaper, M.D. Attending Physician:  Ronne Binning DD:  12/29/00 TD:  12/30/00 Job: [id number]

## 2010-08-01 NOTE — Assessment & Plan Note (Addendum)
No symptoms of dysuria or UTI other than urgency. UA negative, less likely to be UTI.  Could potentially be residual irritation from foley cath from hospitalization, although it has been 10 days so this would be a slightly prolonged time course. Prostate not tender on rectal exam, so unlikely prostatitis. No s/s of systemic infection at this time. Will continue to monitor. Pt to f/u in 2-4 weeks for BP check and for update on urgency.  Given red flags to return to clinic or go to ED for.

## 2010-08-27 NOTE — H&P (Signed)
[name], CONSOLE               ACCOUNT NO.:  [id number]  MEDICAL RECORD NO.:  [id number]           PATIENT TYPE:  I  LOCATION:  3730                         FACILITY:  [facility]  PHYSICIAN:  Nestor Ramp, MD        DATE OF BIRTH:  02/24/1961  DATE OF ADMISSION:  07/17/2010 DATE OF DISCHARGE:                             HISTORY & PHYSICAL   PRIMARY CARE PROVIDER:  Unassigned.  CHIEF COMPLAINT:  Abdominal pain.  HISTORY OF PRESENT ILLNESS:  The patient is a 50 year old man who presents to the ER today with abdominal pain.  He states that the pain started suddenly at 2:30 a.m.  He describes the pain as similar to past flares of his pancreatitis.  He states that it is located in his left upper quadrant.  The patient endorses that since recent discharge from the hospital on July 14, 2010, he has decreased his alcohol to one pint per week, previously he states he drank one pint of gin per day.  No nausea, vomiting, diarrhea.  No fever.  Positive chills last p.m.  ALLERGIES:  MORPHINE causes itching.  MEDICATIONS: 1. Pravastatin 80 mg p.o. daily. 2. Lisinopril/hydrochlorothiazide 10/12.5 mg p.o. daily. 3. Vicodin 1 tablet p.o. q.4 h. p.r.n. 4. Aspirin 325 mg p.o. daily.  These medications were started at the     time of discharge on July 14, 2010.  Previous to that admission,     he did not take any medications.  PAST MEDICAL HISTORY: 1. Alcohol abuse. 2. Chronic back pain. 3. Chronic pancreatitis.  The patient states he has had this times 3-4     years. 4. Right corona CVA.  He was discharged from the hospital on July 14, 2010. 5. Hypertension. 6. Hyperlipidemia. 7. Alcoholic hepatitis.  PAST SURGICAL HISTORY:  Back surgery x3, most recent in March 2012.  SOCIAL HISTORY:  Lives with wife.  He is unemployed.  He does have a high school education.  He smokes one pack per day.  He endorses reduction of alcohol to one pint of gin per week.  Previous to his July 14, 2010, admission, he was drinking one pint of gin per day.  He denies any drug use.  FAMILY HISTORY:  Mother's and father's past medical history unknown.  He does not have any siblings.  REVIEW OF SYSTEMS:  Negative for fevers.  No headache.  No sore throat, no chest pain.  No dyspnea, no wheezing.  No nausea, no vomiting, no diarrhea.  No dysuria, no hematuria.  No rash.  No myalgias.  No petechiae and no dry skin.  Review of systems is positive for chills and positive for weakness.  PHYSICAL EXAMINATION:  VITAL SIGNS:  Temperature 97.9, pulse 92, respiratory rate 20, blood pressure 194/91, pulse ox 100% on room air. GENERAL:  In no acute distress, resting in bed, drowsy. HEENT:  No lymphadenopathy of cervical area.  Mucous membranes moist. Pupils equal, round, reactive to light and accommodation. CARDIOVASCULAR:  Regular rate and rhythm.  No murmurs, rubs, or gallops. LUNGS:  Clear to auscultation bilateral. ABDOMEN:  Positive  tenderness in left upper quadrant.  No rebound, no guarding.  No distention. EXTREMITIES:  Moving all four extremities.  No lower extremity edema. NEUROLOGIC:  Alert and oriented x3.  Follows all commands.  LABORATORY DATA AND STUDIES:  Lipase 871.  Sodium 138, potassium 4.1, creatinine 0.83, glucose 186, calcium 11.4.  AST 24, ALT 18.  Occult blood negative.  White blood cells 13.3, hemoglobin 15.0, platelets 256.  ASSESSMENT AND PLAN:  The patient is an 50 year old man admitted for pancreatitis. 1. Pancreatitis/abdominal pain.  The patient's abdominal pain is most     likely secondary to pancreatitis.  Has history of this in the past.     His lipase is 871.  Significant EtOH history.  We will give 1 liter     of normal saline bolus, then normal saline 250 mL per hour.  We     will make the patient n.p.o. except for ice chips and meds.  We     will obtain CT scan since on attending eval (see Dr. Earl Gala     note) exam findings were atypical.  Left lower  quadrant pain, we     will treat the patient's pain with Dilaudid PCA. 2. Hypertension.  We will hold lisinopril/hydrochlorothiazide since     the patient is n.p.o. currently.  The patient has also increased     risk of acute tubular necrosis in the setting of pancreatitis.     Therefore, we will hold lisinopril/hydrochlorothiazide.  We will     treat the patient's hypertension for now with Norvasc 5 mg p.o.  We     will monitor blood pressure closely.  Recently diagnosed with     hypertension during last admission July 14, 2010.  Hypertension     may also improve with adequate pain control. 3. Chronic back pain.  We will hold Vicodin for now.  Pain treatment     for abdominal pain with Dilaudid PCA. 4. Hyperlipidemia.  We will hold pravastatin and restart at discharge.     I did not feel that increased triglycerides will cause pancreatitis     as at previous admission in April, triglycerides were moderately     increased. 5. Right corona cerebrovascular accident:  Hospital discharge on July 14, 2010.  Only deficit at discharge was left facial droop and mild     left-sided weakness.  We will continue with PT and ST once pain is     well controlled and the patient is able to participate in rehab and     we may need to order home PT and speech therapy. 6. History of alcoholic hepatitis.  Liver enzymes within normal limits     currently.  We will continue to monitor. 7. Fluids, electrolytes, and nutrition.  Normal saline 1 liter bolus,     then 250 mL/hour, n.p.o. 8. Prophylaxis.  Protonix and heparin. 9. Disposition.  Pending clinical improvement.     Ellin Mayhew, MD   ______________________________ Nestor Ramp, MD   DC/MEDQ  D:  07/17/2010  T:  07/18/2010  Job:  [id number]  Electronically Signed by Ellin Mayhew  on 08/07/2010 08:34:57 AM Electronically Signed by Denny Levy MD on 08/27/2010 12:15:07 PM

## 2010-09-02 ENCOUNTER — Emergency Department (HOSPITAL_COMMUNITY): Payer: Medicare Other

## 2010-09-02 ENCOUNTER — Inpatient Hospital Stay (HOSPITAL_COMMUNITY)
Admission: EM | Admit: 2010-09-02 | Discharge: 2010-09-04 | DRG: 439 | Disposition: A | Discharge status: Home / Self Care | Payer: Medicare Other | Attending: Internal Medicine | Admitting: Internal Medicine

## 2010-09-02 DIAGNOSIS — Z79899 Other long term (current) drug therapy: Secondary | ICD-10-CM

## 2010-09-02 DIAGNOSIS — F3289 Other specified depressive episodes: Secondary | ICD-10-CM | POA: Diagnosis present

## 2010-09-02 DIAGNOSIS — D72829 Elevated white blood cell count, unspecified: Secondary | ICD-10-CM | POA: Diagnosis present

## 2010-09-02 DIAGNOSIS — F1020 Alcohol dependence, uncomplicated: Secondary | ICD-10-CM | POA: Diagnosis present

## 2010-09-02 DIAGNOSIS — I10 Essential (primary) hypertension: Secondary | ICD-10-CM | POA: Diagnosis present

## 2010-09-02 DIAGNOSIS — Z8673 Personal history of transient ischemic attack (TIA), and cerebral infarction without residual deficits: Secondary | ICD-10-CM

## 2010-09-02 DIAGNOSIS — K859 Acute pancreatitis without necrosis or infection, unspecified: Principal | ICD-10-CM | POA: Diagnosis present

## 2010-09-02 DIAGNOSIS — E876 Hypokalemia: Secondary | ICD-10-CM | POA: Diagnosis not present

## 2010-09-02 DIAGNOSIS — M549 Dorsalgia, unspecified: Secondary | ICD-10-CM | POA: Diagnosis present

## 2010-09-02 DIAGNOSIS — K861 Other chronic pancreatitis: Secondary | ICD-10-CM | POA: Diagnosis present

## 2010-09-02 DIAGNOSIS — K7010 Alcoholic hepatitis without ascites: Secondary | ICD-10-CM | POA: Diagnosis present

## 2010-09-02 DIAGNOSIS — Z7982 Long term (current) use of aspirin: Secondary | ICD-10-CM

## 2010-09-02 DIAGNOSIS — K862 Cyst of pancreas: Secondary | ICD-10-CM | POA: Diagnosis present

## 2010-09-02 DIAGNOSIS — K863 Pseudocyst of pancreas: Secondary | ICD-10-CM | POA: Diagnosis present

## 2010-09-02 DIAGNOSIS — G8929 Other chronic pain: Secondary | ICD-10-CM | POA: Diagnosis present

## 2010-09-02 DIAGNOSIS — F329 Major depressive disorder, single episode, unspecified: Secondary | ICD-10-CM | POA: Diagnosis present

## 2010-09-02 DIAGNOSIS — E785 Hyperlipidemia, unspecified: Secondary | ICD-10-CM | POA: Diagnosis present

## 2010-09-02 LAB — CBC
HCT: 43.5 % (ref 39.0–52.0)
Hemoglobin: 14.3 g/dL (ref 13.0–17.0)
MCH: 29.4 pg (ref 26.0–34.0)
MCHC: 32.9 g/dL (ref 30.0–36.0)
MCV: 89.5 fL (ref 78.0–100.0)
Platelets: 310 10*3/uL (ref 150–400)
RBC: 4.86 MIL/uL (ref 4.22–5.81)
RDW: 15.2 % (ref 11.5–15.5)
WBC: 16.1 10*3/uL — ABNORMAL HIGH (ref 4.0–10.5)

## 2010-09-02 LAB — COMPREHENSIVE METABOLIC PANEL
ALT: 9 U/L (ref 0–53)
AST: 16 U/L (ref 0–37)
Albumin: 4.6 g/dL (ref 3.5–5.2)
CO2: 25 mEq/L (ref 19–32)
Calcium: 10.3 mg/dL (ref 8.4–10.5)
Creatinine, Ser: 0.77 mg/dL (ref 0.50–1.35)
Sodium: 139 mEq/L (ref 135–145)
Total Protein: 7.8 g/dL (ref 6.0–8.3)

## 2010-09-02 LAB — URINALYSIS, ROUTINE W REFLEX MICROSCOPIC
Glucose, UA: >1000 mg/dL — AB
Hgb urine dipstick: NEGATIVE
Leukocytes, UA: NEGATIVE
Protein, ur: NEGATIVE mg/dL
Specific Gravity, Urine: 1.021 (ref 1.005–1.030)
pH: 7 (ref 5.0–8.0)

## 2010-09-02 LAB — DIFFERENTIAL
Basophils Relative: 0 % (ref 0–1)
Eosinophils Absolute: 0 10*3/uL (ref 0.0–0.7)
Eosinophils Relative: 0 % (ref 0–5)
Lymphs Abs: 1.9 10*3/uL (ref 0.7–4.0)
Monocytes Relative: 5 % (ref 3–12)
Neutrophils Relative %: 84 % — ABNORMAL HIGH (ref 43–77)

## 2010-09-02 LAB — URINE MICROSCOPIC-ADD ON

## 2010-09-03 ENCOUNTER — Encounter (HOSPITAL_COMMUNITY): Payer: Self-pay

## 2010-09-03 LAB — DIFFERENTIAL
Basophils Absolute: 0 10*3/uL (ref 0.0–0.1)
Basophils Relative: 0 % (ref 0–1)
Lymphocytes Relative: 11 % — ABNORMAL LOW (ref 12–46)
Monocytes Absolute: 1 10*3/uL (ref 0.1–1.0)
Neutro Abs: 11.4 10*3/uL — ABNORMAL HIGH (ref 1.7–7.7)
Neutrophils Relative %: 81 % — ABNORMAL HIGH (ref 43–77)

## 2010-09-03 LAB — COMPREHENSIVE METABOLIC PANEL
AST: 33 U/L (ref 0–37)
Albumin: 3.5 g/dL (ref 3.5–5.2)
Alkaline Phosphatase: 75 U/L (ref 39–117)
BUN: 6 mg/dL (ref 6–23)
Creatinine, Ser: 0.63 mg/dL (ref 0.50–1.35)
Potassium: 3.8 mEq/L (ref 3.5–5.1)
Total Protein: 6.8 g/dL (ref 6.0–8.3)

## 2010-09-03 LAB — MAGNESIUM: Magnesium: 1.5 mg/dL (ref 1.5–2.5)

## 2010-09-03 LAB — PHOSPHORUS: Phosphorus: 3.9 mg/dL (ref 2.3–4.6)

## 2010-09-03 LAB — APTT: aPTT: 32 seconds (ref 24–37)

## 2010-09-03 LAB — CBC
Hemoglobin: 13.7 g/dL (ref 13.0–17.0)
MCHC: 33.2 g/dL (ref 30.0–36.0)
WBC: 14.1 10*3/uL — ABNORMAL HIGH (ref 4.0–10.5)

## 2010-09-03 LAB — PROTIME-INR
INR: 1.03 (ref 0.00–1.49)
Prothrombin Time: 13.7 seconds (ref 11.6–15.2)

## 2010-09-03 MED ORDER — IOHEXOL 300 MG/ML  SOLN
100.0000 mL | Freq: Once | INTRAMUSCULAR | Status: AC | PRN
  Administered 2010-09-03: 100 mL via INTRAVENOUS

## 2010-09-04 LAB — COMPREHENSIVE METABOLIC PANEL
ALT: 6 U/L (ref 0–53)
AST: 19 U/L (ref 0–37)
Calcium: 9 mg/dL (ref 8.4–10.5)
GFR calc Af Amer: >60 mL/min (ref >60)
Sodium: 135 mEq/L (ref 135–145)
Total Protein: 6.5 g/dL (ref 6.0–8.3)

## 2010-09-04 LAB — CBC
MCH: 29.2 pg (ref 26.0–34.0)
MCHC: 32.8 g/dL (ref 30.0–36.0)
Platelets: 248 10*3/uL (ref 150–400)

## 2010-09-08 NOTE — H&P (Signed)
[name], [name]               ACCOUNT NO.:  [id number]  MEDICAL RECORD NO.:  [id number]  LOCATION:  WLED                         FACILITY:  [facility]  PHYSICIAN:  Kathlen Mody, MD       DATE OF BIRTH:  [birth year]  DATE OF ADMISSION:  09/02/2010 DATE OF DISCHARGE:                             HISTORY & PHYSICAL   PRIMARY CARE PHYSICIAN:  Demetria Pore, MD, at [facility].  CHIEF COMPLAINT:  Nausea and abdominal pain since this morning.  HISTORY OF PRESENT ILLNESS:  This is a 50 year old gentleman who has a history of chronic pancreatitis, hypertension, hyperlipidemia, chronic back pain, history of CVA, multiple admissions in the past for acute pancreatitis, who comes in for similar complaints of nausea and epigastric pain since this morning.  The patient reports taking alcohol over the last few weeks.  His last alcohol was on Sunday about a pint of gin.  And he reports diaphoresis and nausea associated with his abdominal pain.  His pain is about 7/10 to 8/10, sharp, nonradiating, in the epigastric area.  No history of loose bowel movements.  No history of fever, chest pain.  A little bit of shortness of breath when he has the epigastric pain.  No history of syncope or palpitations.  No history of headache, blurry vision.  No history of any tingling sensations anywhere in his body.  REVIEW OF SYSTEMS:  See HPI, otherwise negative.  PAST MEDICAL HISTORY: 1. Hypertension. 2. Hyperlipidemia. 3. Chronic back pain. 4. Alcohol abuse. 5. Right CVA. 6. Alcoholic hepatitis. 7. Chronic pancreatitis. 8. History of depression.  PAST SURGICAL HISTORY:  History of back surgery, the recent one in March 2012.  SOCIAL HISTORY:  Lives with his wife, he is unemployed.  He is on disability.  He takes about a pint of gin once every 2 to 3 days. Smokes cigarettes about a pack a day.  Denies any recreational drug use.  FAMILY HISTORY:  No significant family history.  PHYSICAL  EXAMINATION:  VITAL SIGNS:  He has a temperature of 98.7, pulse of 81, respiratory rate 18, blood pressure of 200/97, saturating 100% on room air. GENERAL:  He is alert, afebrile, oriented x3 in mild distress from the epigastric pain. HEENT:  Pupils reacting to light and accommodation.  No scleral icterus. No JVD.  Dry mucous membranes.  CARDIOVASCULAR:  S1, S2 heard.  No murmurs, rubs or gallops. CHEST:  Clear to auscultation bilaterally.  No wheezing or rhonchi. ABDOMEN:  Slightly firm, tender in the epigastrium.  No rebound tenderness.  Bowel sounds are heard.  No signs of peritonitis. EXTREMITIES:  No pedal edema or clubbing. NEUROLOGIC:  No focal deficits.  LABORATORY DATA:  The patient had a CBC done which showed a WBC count of 16.1, hemoglobin of 14.3, hematocrit of 43.5, platelets of 310. Comprehensive metabolic panel significant for a glucose of 197.  Liver function tests within normal limits.  Lipase of 2310.  DIAGNOSTIC STUDIES:  The patient had an acute abdominal series which shows no active lung disease, very little bowel gas.  No definite obstruction or free air.  ASSESSMENT AND PLAN:  This is a 50 year old gentleman with history of chronic pancreatitis,  hypertension, hyperlipidemia, who is admitted for acute on chronic pancreatitis.  He will be admitted for pain control. He will be started on IV fluids normal saline at 200 mL/hour, IV pain medication with IV Dilaudid, IV Zofran for nausea, and we will get a CT abdomen and pelvis with contrast to look at his pancreas.  He is afebrile but he has 16,000 of leukocytosis.  We will get a CT abdomen and pelvis and depending on that, will start him on antibiotics.  At this time, I have low threshold to start him on antibiotics as this could be secondary to dehydration.  Hypertension:  The patient's blood pressure parameters are on the high side, his blood pressure systolic is 209/97.  Could be secondary to a combination of  both hypertensive urgency and from the pain.  Will give him IV pain medication and also will give IV hydralazine to control his blood pressure at this time.  For deep venous thrombosis prophylaxis, subcutaneous Lovenox.  For GI prophylaxis, will start him on IV Protonix 40 mg daily.  The patient is full code.          ______________________________ Kathlen Mody, MD     VA/MEDQ  D:  09/02/2010  T:  09/02/2010  Job:  [id number]  Electronically Signed by Kathlen Mody MD on 09/08/2010 02:29:21 AM

## 2010-09-09 NOTE — Discharge Summary (Signed)
  [name], [name]               ACCOUNT NO.:  [id number]  MEDICAL RECORD NO.:  [id number]  LOCATION:  1311                         FACILITY:  [facility]  PHYSICIAN:  Conley Canal, MD      DATE OF BIRTH:  [birth year]  DATE OF ADMISSION:  09/02/2010 DATE OF DISCHARGE:  09/04/2010                        DISCHARGE SUMMARY - REFERRING   PRIMARY CARE PHYSICIAN:  Demetria Pore, MD  DISCHARGE DIAGNOSES: 1. Acute on chronic alcoholic pancreatitis. 2. Alcohol abuse. 3. Tobacco habituation. 4. Accelerated hypertension. 5. Hyperlipidemia. 6. History of right cerebrovascular accident. 7. Chronic back pain. 8. Hypokalemia, hypomagnesemia. 9. Leukocytosis, possibility of superinfection of pseudocysts, which     are too small for drainage.  DISCHARGE MEDICATIONS: 1. Folic acid 1 mg daily. 2. Thiamine 100 mg daily. 3. Ciprofloxacin 500 mg twice daily for 6 days. 4. Amlodipine 10 mg daily. 5. Aspirin 325 mg daily. 6. Vicodin 5/325 mg every 4 hours as needed.  No new prescription     given. 7. Lisinopril/HCTZ 10/12.5 mg daily. 8. Pravastatin 80 mg q.h.s. 9. Protonix 40 mg daily.  PROCEDURES PERFORMED:  CT abdomen and pelvis on June 20th showed acute on chronic pancreatitis with small pseudocyst in the pancreatic head and neck, which appear unchanged from May 2012.  HOSPITAL COURSE:  Mr. Dotzler is a 50 year old male with history of alcoholism and alcoholic-related chronic pancreatitis with some pancreatic pseudocyst, who came in with complaints of abdominal pain associated with some nausea.  He was found to have acute on chronic pancreatitis.  He also had leukocytosis with a white count of 16,000. There was concern for possible superinfection of the small pseudocyst. Hence, the patient was empirically placed on quinolones with improvement of the white count to 10.4.  His lipase at admission was 2300.  He was started on feedings on June 20th and his diet was advanced to regular diet  on June 21st and his pain had completely resolved.  He requested discharge to follow with his primary care provider and he will call for an appointment to be seen in the morning tomorrow.  Labs today include a white count of 10.4, hemoglobin 12.6, hematocrit 38.4, platelet count 248, sodium 135, potassium 3.3, BUN 4, creatinine 0.72, magnesium 1.7. Electrolytes were replenished prior to discharge.  He is discharged in stable condition to follow with Dr. Fara Boros on June 22nd.  He promised to quit alcohol consumption.  The time spent for discharge preparation was less than 30 minutes.     Conley Canal, MD     SR/MEDQ  D:  09/04/2010  T:  09/04/2010  Job:  [id number]  cc:   Demetria Pore, MD  Electronically Signed by Conley Canal  on 09/09/2010 10:21:20 AM

## 2010-10-17 NOTE — H&P (Signed)
[name], [name]               ACCOUNT NO.:  [id number]  MEDICAL RECORD NO.:  [id number]  LOCATION:  3005                         FACILITY:  [facility]  PHYSICIAN:  Paula Compton, MD        DATE OF BIRTH:  09/26/1960  DATE OF ADMISSION:  07/09/2010 DATE OF DISCHARGE:                             HISTORY & PHYSICAL   PRIMARY CARE PHYSICIAN:  Unassigned.  CHIEF COMPLAINT:  Left-sided weakness with slurred speech x2 days.  HISTORY OF PRESENT ILLNESS:  This is a 50 year old male who presents to the ER with a 2-day history of left-sided weakness, slurred speech, unsteady gait.  The patient noticed this onset about 2 days ago.  At that time, he presented to the ED but left without been seen due to long wait.  The patient states at that time he was also having left-sided numbness and tingling, which have now resolved.  He denies having any history of previous similar episodes.  Overall, his wife feels that the slurred speech is improving, but he is concerned because he continues to have some unsteadiness of his gait.  The patient notes no syncope, presyncope, vertigo, confusion, dyspnea, and states he is otherwise at his baseline.  Upon review of systems, he notes that he did have one episode of left-sided chest pressure today nonradiating, nonexertional without nausea or dyspnea, itself resolved within a matter of minutes. The patient states he has no history of heart disease, hypertension, or previous chest pain.  PAST MEDICAL HISTORY: 1. Chronic back pain. 2. History of alcohol withdrawal and seizures. 3. Alcoholic hepatitis. 4. Pancreatitis.  PAST SURGICAL HISTORY:  The patient has had three back surgeries with the most recent being June 13, 2010.  SOCIAL HISTORY:  The patient lives alone with his wife, [name]. The patient has a 54 year old daughter.  He is currently unemployed, and has a high school degree.  He has a history of one pack per day smoking of many years.  He  states he has recently cut down to half pack per day. He has a history of heavy alcohol use, states typically drinking a fifth to a pint of gin daily.  He denies any illicit drug use.  FAMILY HISTORY:  He states his mother's health history is unknown.  She died early of a car accident.  His father's health is a known.  He has no siblings.  REVIEW OF SYSTEMS:  Positive for chest pain, decreased appetite, dysarthria, weakness.  Negative for fevers, chills, weight change, headache, ear pain, palpitations, cough, dyspnea, wheezing, nausea, vomiting, diarrhea, rectal bleeding, abdominal pain, dysuria, rash, myalgias, visual changes.  PHYSICAL EXAMINATION:  VITAL SIGNS:  Temperature 98.9, respirations 18, blood pressure 161/69, pulse ox 99% on room air. GENERAL:  No acute distress, alert and oriented x4. HEENT: Extraocular movements intact.  Pupils equally round and reactive to light. NECK:  Supple. CARDIOVASCULAR:  Regular rate and rhythm.  No murmurs. LUNGS:  Clear to auscultation bilaterally. ABDOMEN:  Positive bowel sounds, soft. BACK:  Lumbar spine surgical scar well healing. NEURO:  Left-sided facial droop with slight right-sided tongue deviation.  The patient with mildly slurred speech.  Left upper and lower extremity strength  4/5.  Reflexes 2+ bilaterally.  No pronator drift, slightly unsteady Romberg.  The patient has an unsteady gait.  He shows difficulty with rapid alternating movements.  Finger-to-nose within normal limits.  LABORATORY STUDIES:  Stroke panel:  White blood cell count 8.8, hemoglobin 12.7, platelets 251, INR 1.0.  Sodium 137, potassium 3.3, chloride 105, bicarbonate 28, glucose 121, BUN 6, creatinine 0.97, bilirubin 1.1, alkaline phosphatase 68, AST 19, ALT 12, protein 6.9, calcium 9.0.  EKG, tachycardic, rate 112.  No ST elevations, depressions, or significant Q-waves.  CT of head, remote lacunar basal ganglia infarcts and periventricular white matter  disease, some new basal ganglia infarct new since last imaging in 2009.  ASSESSMENT AND PLAN:  This is a 50 year old with acute cerebrovascular accident. 1. Acute cerebrovascular accident.  CT shows remote lacunar basal     ganglia infarct from admission in 2009.  No acute findings noted.     We will order MRI, MRA to evaluate for two more concretely     identified area of infarction and to evaluate for significant     vascular stenosis contributing to a stroke.  We will also order 2D     echo and hemoglobin A1c and fasting lipid profile for risk     management evaluation.  The patient will also have a PT, OT     consult, and will be started aspirin 325 mg p.o. daily. 2. Chest pain.  We will cycle the patient's cardiac enzymes and repeat     EKG in the morning. 3. Alcohol abuse.  Monitor the patient closely for history of     withdrawal.  He will be placed on CIWA protocol. 4. Tobacco abuse.  The patient will be placed on a nicotine patch     daily.  We will order a tobacco sensation. 5. Prophylaxis.  Lovenox subcu daily. 6. Fluids, electrolytes, nutrition, gastrointestinal.  Saline lock IV,     regular diet.  The patient is to have swallow screen 7. Disposition.  Pending clinical improvement.     Delbert Harness, MD   ______________________________ Paula Compton, MD    KB/MEDQ  D:  07/09/2010  T:  07/10/2010  Job:  [id number]  Electronically Signed by Delbert Harness MD on 10/15/2010 12:33:40 PM Electronically Signed by Paula Compton MD on 10/17/2010 03:53:33 PM

## 2010-12-11 LAB — COMPREHENSIVE METABOLIC PANEL
AST: 104 — ABNORMAL HIGH
Alkaline Phosphatase: 69
BUN: 4 — ABNORMAL LOW
CO2: 26
Chloride: 100
Creatinine, Ser: 0.88
GFR calc non Af Amer: >60
Potassium: 4.1
Total Bilirubin: 2.6 — ABNORMAL HIGH

## 2010-12-11 LAB — DIFFERENTIAL
Basophils Relative: 1
Lymphs Abs: 3.3
Monocytes Absolute: 0.9
Monocytes Relative: 10
Neutro Abs: 4.3

## 2010-12-11 LAB — CK TOTAL AND CKMB (NOT AT ARMC)
CK, MB: 2.8
CK, MB: 3.2
Relative Index: 0.5
Total CK: 613 — ABNORMAL HIGH
Total CK: 615 — ABNORMAL HIGH

## 2010-12-11 LAB — TROPONIN I
Troponin I: 0.01
Troponin I: 0.05

## 2010-12-11 LAB — CBC
HCT: 38 — ABNORMAL LOW
Hemoglobin: 13.9
MCHC: 35
MCV: 93.6
RBC: 4.06 — ABNORMAL LOW
RBC: 4.29
WBC: 7.8

## 2010-12-11 LAB — HEPATIC FUNCTION PANEL
ALT: 153 — ABNORMAL HIGH
AST: 184 — ABNORMAL HIGH
Albumin: 4.3
Total Protein: 7.2

## 2010-12-11 LAB — POCT CARDIAC MARKERS
CKMB, poc: 2
Troponin i, poc: <0.05

## 2010-12-11 LAB — POCT I-STAT, CHEM 8
Chloride: 98
Glucose, Bld: 109 — ABNORMAL HIGH
HCT: 45
Potassium: 3.4 — ABNORMAL LOW
Sodium: 136

## 2010-12-12 LAB — LIPID PANEL
Cholesterol: 278 — ABNORMAL HIGH
HDL: 108
LDL Cholesterol: 162 — ABNORMAL HIGH
Total CHOL/HDL Ratio: 2.6

## 2010-12-12 LAB — CBC
Hemoglobin: 14.5
Hemoglobin: 11.2 — ABNORMAL LOW
Hemoglobin: 11.4 — ABNORMAL LOW
Hemoglobin: 11.5 — ABNORMAL LOW
MCHC: 33.9
MCHC: 33.8
MCHC: 33.8
MCV: 93.6
Platelets: 103 — ABNORMAL LOW
RBC: 4.58
RBC: 3.6 — ABNORMAL LOW
RBC: 3.62 — ABNORMAL LOW
RDW: 13.1
RDW: 13.5
WBC: 8.7
WBC: 6.3
WBC: 7.5

## 2010-12-12 LAB — BASIC METABOLIC PANEL
BUN: 2 — ABNORMAL LOW
CO2: 29
Calcium: 8.6
GFR calc non Af Amer: >60
Glucose, Bld: 102 — ABNORMAL HIGH

## 2010-12-12 LAB — COMPREHENSIVE METABOLIC PANEL
ALT: 169 — ABNORMAL HIGH
ALT: 121 — ABNORMAL HIGH
ALT: 81 — ABNORMAL HIGH
ALT: 94 — ABNORMAL HIGH
AST: 228 — ABNORMAL HIGH
AST: 154 — ABNORMAL HIGH
AST: 94 — ABNORMAL HIGH
Albumin: 3.3 — ABNORMAL LOW
Alkaline Phosphatase: 98
Alkaline Phosphatase: 80
Alkaline Phosphatase: 81
CO2: 27
CO2: 25
Calcium: 10.1
Calcium: 8.7
Calcium: 9
Chloride: 95 — ABNORMAL LOW
Chloride: 100
Chloride: 104
Creatinine, Ser: 0.8
GFR calc Af Amer: >60
GFR calc Af Amer: >60
GFR calc Af Amer: >60
GFR calc non Af Amer: >60
GFR calc non Af Amer: >60
Glucose, Bld: 99
Glucose, Bld: 83
Glucose, Bld: 86
Potassium: 2.9 — ABNORMAL LOW
Potassium: 3.8
Potassium: 4.1
Sodium: 138
Sodium: 137
Sodium: 140
Total Bilirubin: 1.9 — ABNORMAL HIGH
Total Bilirubin: 1.5 — ABNORMAL HIGH
Total Bilirubin: 2.2 — ABNORMAL HIGH
Total Protein: 5.8 — ABNORMAL LOW
Total Protein: 6.1

## 2010-12-12 LAB — CROSSMATCH
ABO/RH(D): O POS
Antibody Screen: NEGATIVE

## 2010-12-12 LAB — POCT I-STAT, CHEM 8
Chloride: 101
Creatinine, Ser: 0.9
Glucose, Bld: 97
HCT: 40
HCT: 45
Hemoglobin: 13.6
Hemoglobin: 15.3
Potassium: 2.9 — ABNORMAL LOW
Potassium: 4.7
Sodium: 136
TCO2: 27

## 2010-12-12 LAB — HEMOGLOBIN AND HEMATOCRIT, BLOOD
HCT: 34.7 — ABNORMAL LOW
Hemoglobin: 11.7 — ABNORMAL LOW

## 2010-12-12 LAB — ACETAMINOPHEN LEVEL: Acetaminophen (Tylenol), Serum: <10 — ABNORMAL LOW

## 2010-12-12 LAB — ABO/RH: ABO/RH(D): O POS

## 2010-12-12 LAB — OCCULT BLOOD X 1 CARD TO LAB, STOOL: Fecal Occult Bld: NEGATIVE

## 2010-12-12 LAB — LIPASE, BLOOD: Lipase: 85 — ABNORMAL HIGH

## 2010-12-12 LAB — DIFFERENTIAL
Basophils Absolute: 0
Basophils Relative: 0
Eosinophils Absolute: 0.2
Eosinophils Relative: 2
Lymphs Abs: 2
Neutrophils Relative %: 64

## 2010-12-12 LAB — CLOSTRIDIUM DIFFICILE EIA: C difficile Toxins A+B, EIA: NEGATIVE

## 2010-12-12 LAB — TSH: TSH: 0.884

## 2010-12-12 LAB — RAPID URINE DRUG SCREEN, HOSP PERFORMED
Cocaine: NOT DETECTED
Tetrahydrocannabinol: NOT DETECTED

## 2010-12-12 LAB — FECAL LACTOFERRIN, QUANT

## 2010-12-12 LAB — PROTIME-INR: Prothrombin Time: 12.6

## 2010-12-15 LAB — DIFFERENTIAL
Eosinophils Absolute: 0.1
Eosinophils Relative: 1
Lymphs Abs: 2.7
Monocytes Absolute: 1.3 — ABNORMAL HIGH
Neutrophils Relative %: 68

## 2010-12-15 LAB — HEPATIC FUNCTION PANEL
ALT: 27
AST: 66 — ABNORMAL HIGH
Albumin: 3.5
Alkaline Phosphatase: 81
Total Bilirubin: 1.9 — ABNORMAL HIGH
Total Protein: 7.2

## 2010-12-15 LAB — CBC
MCHC: 33.9
MCV: 94.5
Platelets: 130 — ABNORMAL LOW
RDW: 16.6 — ABNORMAL HIGH

## 2010-12-15 LAB — POCT I-STAT, CHEM 8
BUN: 14
Calcium, Ion: 1.03 — ABNORMAL LOW
Creatinine, Ser: 1.3
Glucose, Bld: 126 — ABNORMAL HIGH
Hemoglobin: 13.9
Sodium: 131 — ABNORMAL LOW
TCO2: 27

## 2010-12-15 LAB — POCT CARDIAC MARKERS: Troponin i, poc: <0.05

## 2010-12-17 LAB — COMPREHENSIVE METABOLIC PANEL
ALT: 55 — ABNORMAL HIGH
ALT: 63 — ABNORMAL HIGH
AST: 120 — ABNORMAL HIGH
AST: 96 — ABNORMAL HIGH
Albumin: 3.9
Albumin: 3.9
Alkaline Phosphatase: 86
Alkaline Phosphatase: 87
CO2: 17 — ABNORMAL LOW
Calcium: 9.3
Chloride: 97
Creatinine, Ser: 1
GFR calc Af Amer: >60
GFR calc Af Amer: >60
GFR calc Af Amer: >60
GFR calc non Af Amer: >60
Glucose, Bld: 127 — ABNORMAL HIGH
Potassium: 3 — ABNORMAL LOW
Potassium: 3 — ABNORMAL LOW
Potassium: 3.4 — ABNORMAL LOW
Sodium: 138
Sodium: 139
Total Bilirubin: 2.8 — ABNORMAL HIGH
Total Protein: 6.6
Total Protein: 7.2
Total Protein: 7.2

## 2010-12-17 LAB — TSH: TSH: 0.89

## 2010-12-17 LAB — CBC
Hemoglobin: 13.6
MCHC: 33.2
MCV: 92.1
Platelets: 96 — ABNORMAL LOW
Platelets: 99 — ABNORMAL LOW
RBC: 4.38
RDW: 14.5
RDW: 14.8
WBC: 10.1

## 2010-12-17 LAB — URINE MICROSCOPIC-ADD ON

## 2010-12-17 LAB — CARDIAC PANEL(CRET KIN+CKTOT+MB+TROPI)
Relative Index: 0.5
Relative Index: 0.6
Relative Index: 1
Total CK: 517 — ABNORMAL HIGH
Total CK: 562 — ABNORMAL HIGH
Total CK: 571 — ABNORMAL HIGH
Troponin I: 0.02
Troponin I: 0.02
Troponin I: 0.03

## 2010-12-17 LAB — BLOOD GAS, ARTERIAL
Acid-Base Excess: 1.5
Bicarbonate: 24.9 — ABNORMAL HIGH
TCO2: 25.9
pCO2 arterial: 34.6 — ABNORMAL LOW
pO2, Arterial: 86.6

## 2010-12-17 LAB — RAPID URINE DRUG SCREEN, HOSP PERFORMED
Barbiturates: NOT DETECTED
Cocaine: NOT DETECTED
Opiates: NOT DETECTED
Tetrahydrocannabinol: NOT DETECTED

## 2010-12-17 LAB — LIPID PANEL
HDL: >110
Triglycerides: 75
VLDL: 15

## 2010-12-17 LAB — LACTIC ACID, PLASMA: Lactic Acid, Venous: 1.8

## 2010-12-17 LAB — HEPATITIS PANEL, ACUTE: Hepatitis B Surface Ag: NEGATIVE

## 2010-12-17 LAB — DIFFERENTIAL
Basophils Relative: 0
Eosinophils Absolute: 0.1
Eosinophils Relative: 1
Lymphs Abs: 1.4
Monocytes Relative: 6
Neutrophils Relative %: 77

## 2010-12-17 LAB — URINALYSIS, ROUTINE W REFLEX MICROSCOPIC
Bilirubin Urine: NEGATIVE
Glucose, UA: NEGATIVE
Ketones, ur: NEGATIVE
Protein, ur: 30 — AB
pH: 6

## 2010-12-17 LAB — ETHANOL: Alcohol, Ethyl (B): 41 — ABNORMAL HIGH

## 2010-12-17 LAB — SALICYLATE LEVEL: Salicylate Lvl: <4

## 2010-12-17 LAB — GLUCOSE, CAPILLARY: Glucose-Capillary: 157 — ABNORMAL HIGH

## 2010-12-17 LAB — HEMOGLOBIN A1C
Hgb A1c MFr Bld: 5.6
Mean Plasma Glucose: 114

## 2010-12-17 LAB — ACETAMINOPHEN LEVEL: Acetaminophen (Tylenol), Serum: <10 — ABNORMAL LOW

## 2010-12-17 LAB — MAGNESIUM: Magnesium: 2.2

## 2011-08-03 ENCOUNTER — Other Ambulatory Visit: Payer: Self-pay | Admitting: Neurosurgery

## 2011-08-03 DIAGNOSIS — M545 Low back pain: Secondary | ICD-10-CM

## 2011-08-04 ENCOUNTER — Ambulatory Visit
Admission: RE | Admit: 2011-08-04 | Discharge: 2011-08-04 | Disposition: A | Discharge status: Home / Self Care | Payer: Medicare Other | Source: Ambulatory Visit | Attending: Neurosurgery | Admitting: Neurosurgery

## 2011-08-04 DIAGNOSIS — M545 Low back pain, unspecified: Secondary | ICD-10-CM

## 2011-08-04 MED ORDER — GADOBENATE DIMEGLUMINE 529 MG/ML IV SOLN
15.0000 mL | Freq: Once | INTRAVENOUS | Status: AC | PRN
  Administered 2011-08-04: 15 mL via INTRAVENOUS

## 2011-10-01 ENCOUNTER — Encounter (HOSPITAL_COMMUNITY)
Admission: EM | Disposition: A | Discharge status: Home / Self Care | Payer: Self-pay | Source: Home / Self Care | Attending: Family Medicine

## 2011-10-01 ENCOUNTER — Encounter (HOSPITAL_COMMUNITY): Payer: Self-pay | Admitting: *Deleted

## 2011-10-01 ENCOUNTER — Inpatient Hospital Stay (HOSPITAL_COMMUNITY)
Admission: EM | Admit: 2011-10-01 | Discharge: 2011-10-03 | DRG: 247 | Disposition: A | Discharge status: Home / Self Care | Payer: Medicare Other | Attending: Family Medicine | Admitting: Family Medicine

## 2011-10-01 ENCOUNTER — Emergency Department (HOSPITAL_COMMUNITY): Payer: Medicare Other

## 2011-10-01 DIAGNOSIS — Z7982 Long term (current) use of aspirin: Secondary | ICD-10-CM

## 2011-10-01 DIAGNOSIS — K861 Other chronic pancreatitis: Secondary | ICD-10-CM | POA: Diagnosis present

## 2011-10-01 DIAGNOSIS — E785 Hyperlipidemia, unspecified: Secondary | ICD-10-CM | POA: Diagnosis present

## 2011-10-01 DIAGNOSIS — I209 Angina pectoris, unspecified: Secondary | ICD-10-CM

## 2011-10-01 DIAGNOSIS — I214 Non-ST elevation (NSTEMI) myocardial infarction: Principal | ICD-10-CM

## 2011-10-01 DIAGNOSIS — I219 Acute myocardial infarction, unspecified: Secondary | ICD-10-CM

## 2011-10-01 DIAGNOSIS — E7800 Pure hypercholesterolemia, unspecified: Secondary | ICD-10-CM | POA: Diagnosis present

## 2011-10-01 DIAGNOSIS — I10 Essential (primary) hypertension: Secondary | ICD-10-CM

## 2011-10-01 DIAGNOSIS — F1010 Alcohol abuse, uncomplicated: Secondary | ICD-10-CM

## 2011-10-01 DIAGNOSIS — I2510 Atherosclerotic heart disease of native coronary artery without angina pectoris: Secondary | ICD-10-CM | POA: Diagnosis present

## 2011-10-01 DIAGNOSIS — F17200 Nicotine dependence, unspecified, uncomplicated: Secondary | ICD-10-CM | POA: Diagnosis present

## 2011-10-01 DIAGNOSIS — Z8673 Personal history of transient ischemic attack (TIA), and cerebral infarction without residual deficits: Secondary | ICD-10-CM

## 2011-10-01 DIAGNOSIS — M1990 Unspecified osteoarthritis, unspecified site: Secondary | ICD-10-CM | POA: Diagnosis present

## 2011-10-01 DIAGNOSIS — R079 Chest pain, unspecified: Secondary | ICD-10-CM | POA: Diagnosis present

## 2011-10-01 HISTORY — DX: Unspecified convulsions: R56.9

## 2011-10-01 HISTORY — PX: LEFT HEART CATHETERIZATION WITH CORONARY ANGIOGRAM: SHX5451

## 2011-10-01 LAB — LIPID PANEL
Cholesterol: 272 mg/dL — ABNORMAL HIGH (ref 0–200)
Triglycerides: 77 mg/dL (ref <150)
VLDL: 15 mg/dL (ref 0–40)

## 2011-10-01 LAB — BASIC METABOLIC PANEL
CO2: 28 mEq/L (ref 19–32)
Calcium: 9.2 mg/dL (ref 8.4–10.5)
GFR calc Af Amer: >90 mL/min (ref >90)
Sodium: 138 mEq/L (ref 135–145)

## 2011-10-01 LAB — HEPARIN LEVEL (UNFRACTIONATED): Heparin Unfractionated: 0.48 IU/mL (ref 0.30–0.70)

## 2011-10-01 LAB — COMPREHENSIVE METABOLIC PANEL
ALT: 13 U/L (ref 0–53)
AST: 20 U/L (ref 0–37)
Albumin: 4.3 g/dL (ref 3.5–5.2)
CO2: 28 mEq/L (ref 19–32)
Chloride: 99 mEq/L (ref 96–112)
Creatinine, Ser: 0.89 mg/dL (ref 0.50–1.35)
GFR calc non Af Amer: >90 mL/min (ref >90)
Potassium: 4.3 mEq/L (ref 3.5–5.1)
Sodium: 139 mEq/L (ref 135–145)
Total Bilirubin: 0.6 mg/dL (ref 0.3–1.2)

## 2011-10-01 LAB — CBC WITH DIFFERENTIAL/PLATELET
Basophils Absolute: 0 10*3/uL (ref 0.0–0.1)
Basophils Relative: 0 % (ref 0–1)
HCT: 42.7 % (ref 39.0–52.0)
Lymphocytes Relative: 35 % (ref 12–46)
MCHC: 34.4 g/dL (ref 30.0–36.0)
Neutro Abs: 5.3 10*3/uL (ref 1.7–7.7)
Neutrophils Relative %: 52 % (ref 43–77)
Platelets: 226 10*3/uL (ref 150–400)
RDW: 14.3 % (ref 11.5–15.5)
WBC: 10.3 10*3/uL (ref 4.0–10.5)

## 2011-10-01 LAB — CARDIAC PANEL(CRET KIN+CKTOT+MB+TROPI)
CK, MB: 22.7 ng/mL (ref 0.3–4.0)
Troponin I: 4.79 ng/mL (ref <0.30)

## 2011-10-01 LAB — PROTIME-INR
INR: 0.99 (ref 0.00–1.49)
Prothrombin Time: 13.3 seconds (ref 11.6–15.2)

## 2011-10-01 LAB — POCT I-STAT TROPONIN I: Troponin i, poc: 0.25 ng/mL (ref 0.00–0.08)

## 2011-10-01 LAB — RAPID URINE DRUG SCREEN, HOSP PERFORMED
Amphetamines: NOT DETECTED
Barbiturates: NOT DETECTED
Benzodiazepines: NOT DETECTED
Cocaine: NOT DETECTED

## 2011-10-01 LAB — CBC
Hemoglobin: 13.5 g/dL (ref 13.0–17.0)
MCHC: 33.3 g/dL (ref 30.0–36.0)
Platelets: 211 10*3/uL (ref 150–400)
RDW: 14.4 % (ref 11.5–15.5)

## 2011-10-01 LAB — TROPONIN I: Troponin I: <0.3 ng/mL (ref <0.30)

## 2011-10-01 SURGERY — LEFT HEART CATHETERIZATION WITH CORONARY ANGIOGRAM
Anesthesia: LOCAL

## 2011-10-01 MED ORDER — MIDAZOLAM HCL 2 MG/2ML IJ SOLN
INTRAMUSCULAR | Status: AC
  Filled 2011-10-01: qty 2

## 2011-10-01 MED ORDER — BIVALIRUDIN 250 MG IV SOLR
INTRAVENOUS | Status: AC
  Filled 2011-10-01: qty 250

## 2011-10-01 MED ORDER — ACETAMINOPHEN 325 MG PO TABS: 650.0000 mg | ORAL_TABLET | Freq: Four times a day (QID) | ORAL | Status: DC | PRN

## 2011-10-01 MED ORDER — ASPIRIN 81 MG PO CHEW
324.0000 mg | CHEWABLE_TABLET | ORAL | Status: AC
  Administered 2011-10-02: 324 mg via ORAL
  Filled 2011-10-01: qty 4

## 2011-10-01 MED ORDER — ASPIRIN 81 MG PO CHEW: 81.0000 mg | CHEWABLE_TABLET | Freq: Every day | ORAL | Status: DC

## 2011-10-01 MED ORDER — ASPIRIN EC 81 MG PO TBEC: 81.0000 mg | DELAYED_RELEASE_TABLET | Freq: Every day | ORAL | Status: DC

## 2011-10-01 MED ORDER — SODIUM CHLORIDE 0.9 % IJ SOLN
3.0000 mL | Freq: Two times a day (BID) | INTRAMUSCULAR | Status: DC
  Administered 2011-10-01 – 2011-10-02 (×3): 3 mL via INTRAVENOUS

## 2011-10-01 MED ORDER — FAMOTIDINE IN NACL 20-0.9 MG/50ML-% IV SOLN
20.0000 mg | Freq: Once | INTRAVENOUS | Status: AC
  Administered 2011-10-01: 20 mg via INTRAVENOUS
  Filled 2011-10-01: qty 50

## 2011-10-01 MED ORDER — SODIUM CHLORIDE 0.9 % IJ SOLN: 3.0000 mL | Freq: Two times a day (BID) | INTRAMUSCULAR | Status: DC

## 2011-10-01 MED ORDER — ACETAMINOPHEN 650 MG RE SUPP: 650.0000 mg | Freq: Four times a day (QID) | RECTAL | Status: DC | PRN

## 2011-10-01 MED ORDER — TICAGRELOR 90 MG PO TABS
90.0000 mg | ORAL_TABLET | Freq: Two times a day (BID) | ORAL | Status: DC
  Filled 2011-10-01 (×2): qty 1

## 2011-10-01 MED ORDER — SODIUM CHLORIDE 0.9 % IJ SOLN
3.0000 mL | Freq: Two times a day (BID) | INTRAMUSCULAR | Status: DC
  Administered 2011-10-01: 3 mL via INTRAVENOUS

## 2011-10-01 MED ORDER — SODIUM CHLORIDE 0.9 % IJ SOLN: 3.0000 mL | INTRAMUSCULAR | Status: DC | PRN

## 2011-10-01 MED ORDER — HEPARIN (PORCINE) IN NACL 2-0.9 UNIT/ML-% IJ SOLN
INTRAMUSCULAR | Status: AC
  Filled 2011-10-01: qty 2000

## 2011-10-01 MED ORDER — THIAMINE HCL 100 MG/ML IJ SOLN
100.0000 mg | Freq: Every day | INTRAMUSCULAR | Status: DC
  Administered 2011-10-01: 100 mg via INTRAVENOUS
  Filled 2011-10-01 (×3): qty 1

## 2011-10-01 MED ORDER — NITROGLYCERIN IN D5W 200-5 MCG/ML-% IV SOLN
INTRAVENOUS | Status: AC
  Filled 2011-10-01: qty 250

## 2011-10-01 MED ORDER — DIAZEPAM 5 MG PO TABS
5.0000 mg | ORAL_TABLET | ORAL | Status: AC
  Administered 2011-10-02: 07:00:00 5 mg via ORAL
  Filled 2011-10-01: qty 1

## 2011-10-01 MED ORDER — SODIUM CHLORIDE 0.9 % IV SOLN: 250.0000 mL | INTRAVENOUS | Status: DC | PRN

## 2011-10-01 MED ORDER — NITROGLYCERIN 0.4 MG SL SUBL
0.4000 mg | SUBLINGUAL_TABLET | SUBLINGUAL | Status: DC | PRN
  Administered 2011-10-01: 0.4 mg via SUBLINGUAL

## 2011-10-01 MED ORDER — METOPROLOL TARTRATE 25 MG PO TABS
100.0000 mg | ORAL_TABLET | Freq: Once | ORAL | Status: AC
  Administered 2011-10-01: 100 mg via ORAL
  Filled 2011-10-01: qty 4

## 2011-10-01 MED ORDER — ACETAMINOPHEN 325 MG PO TABS: 650.0000 mg | ORAL_TABLET | ORAL | Status: DC | PRN

## 2011-10-01 MED ORDER — ONDANSETRON HCL 4 MG/2ML IJ SOLN: 4.0000 mg | Freq: Four times a day (QID) | INTRAMUSCULAR | Status: DC | PRN

## 2011-10-01 MED ORDER — SODIUM CHLORIDE 0.9 % IV BOLUS (SEPSIS)
1000.0000 mL | Freq: Once | INTRAVENOUS | Status: AC
  Administered 2011-10-01: 1000 mL via INTRAVENOUS

## 2011-10-01 MED ORDER — HEPARIN (PORCINE) IN NACL 100-0.45 UNIT/ML-% IJ SOLN
900.0000 [IU]/h | INTRAMUSCULAR | Status: DC
  Administered 2011-10-01: 900 [IU]/h via INTRAVENOUS
  Filled 2011-10-01: qty 250

## 2011-10-01 MED ORDER — OXYCODONE-ACETAMINOPHEN 5-325 MG PO TABS: 1.0000 | ORAL_TABLET | ORAL | Status: DC | PRN

## 2011-10-01 MED ORDER — NITROGLYCERIN IN D5W 200-5 MCG/ML-% IV SOLN: 10.0000 ug/min | INTRAVENOUS | Status: DC

## 2011-10-01 MED ORDER — ADULT MULTIVITAMIN W/MINERALS CH
1.0000 | ORAL_TABLET | Freq: Every day | ORAL | Status: DC
  Administered 2011-10-01 – 2011-10-03 (×3): 1 via ORAL
  Filled 2011-10-01 (×3): qty 1

## 2011-10-01 MED ORDER — PANTOPRAZOLE SODIUM 40 MG IV SOLR
40.0000 mg | Freq: Once | INTRAVENOUS | Status: AC
  Administered 2011-10-01: 40 mg via INTRAVENOUS
  Filled 2011-10-01: qty 40

## 2011-10-01 MED ORDER — METOPROLOL TARTRATE 25 MG PO TABS
25.0000 mg | ORAL_TABLET | Freq: Two times a day (BID) | ORAL | Status: DC
  Filled 2011-10-01: qty 1

## 2011-10-01 MED ORDER — ASPIRIN 81 MG PO CHEW: 324.0000 mg | CHEWABLE_TABLET | ORAL | Status: DC

## 2011-10-01 MED ORDER — ATORVASTATIN CALCIUM 80 MG PO TABS
80.0000 mg | ORAL_TABLET | Freq: Every day | ORAL | Status: DC
  Filled 2011-10-01: qty 1

## 2011-10-01 MED ORDER — ALUM & MAG HYDROXIDE-SIMETH 200-200-20 MG/5ML PO SUSP: 30.0000 mL | Freq: Four times a day (QID) | ORAL | Status: DC | PRN

## 2011-10-01 MED ORDER — ASPIRIN EC 81 MG PO TBEC
81.0000 mg | DELAYED_RELEASE_TABLET | Freq: Every day | ORAL | Status: DC
  Filled 2011-10-01: qty 1

## 2011-10-01 MED ORDER — ASPIRIN 81 MG PO CHEW: 324.0000 mg | CHEWABLE_TABLET | ORAL | Status: AC

## 2011-10-01 MED ORDER — TICAGRELOR 90 MG PO TABS
ORAL_TABLET | ORAL | Status: AC
  Filled 2011-10-01: qty 2

## 2011-10-01 MED ORDER — NICOTINE 14 MG/24HR TD PT24
14.0000 mg | MEDICATED_PATCH | Freq: Every day | TRANSDERMAL | Status: DC
  Administered 2011-10-01 – 2011-10-03 (×3): 14 mg via TRANSDERMAL
  Filled 2011-10-01 (×3): qty 1

## 2011-10-01 MED ORDER — LORAZEPAM 2 MG/ML IJ SOLN: 1.0000 mg | Freq: Four times a day (QID) | INTRAMUSCULAR | Status: DC | PRN

## 2011-10-01 MED ORDER — PANTOPRAZOLE SODIUM 40 MG PO TBEC
40.0000 mg | DELAYED_RELEASE_TABLET | Freq: Every day | ORAL | Status: DC
  Administered 2011-10-02: 17:00:00 40 mg via ORAL
  Filled 2011-10-01: qty 1

## 2011-10-01 MED ORDER — FENTANYL CITRATE 0.05 MG/ML IJ SOLN
INTRAMUSCULAR | Status: AC
  Filled 2011-10-01: qty 2

## 2011-10-01 MED ORDER — SODIUM CHLORIDE 0.9 % IV SOLN
INTRAVENOUS | Status: DC
  Administered 2011-10-02: 05:00:00 via INTRAVENOUS

## 2011-10-01 MED ORDER — ATORVASTATIN CALCIUM 80 MG PO TABS: 80.0000 mg | ORAL_TABLET | Freq: Every day | ORAL | Status: DC

## 2011-10-01 MED ORDER — HEPARIN (PORCINE) IN NACL 100-0.45 UNIT/ML-% IJ SOLN
900.0000 [IU]/h | INTRAMUSCULAR | Status: DC
  Filled 2011-10-01: qty 250

## 2011-10-01 MED ORDER — NITROGLYCERIN 0.2 MG/ML ON CALL CATH LAB
INTRAVENOUS | Status: AC
  Filled 2011-10-01: qty 1

## 2011-10-01 MED ORDER — PNEUMOCOCCAL VAC POLYVALENT 25 MCG/0.5ML IJ INJ
0.5000 mL | INJECTION | INTRAMUSCULAR | Status: AC
  Filled 2011-10-01: qty 0.5

## 2011-10-01 MED ORDER — SODIUM CHLORIDE 0.9 % IV SOLN
INTRAVENOUS | Status: DC
  Administered 2011-10-01: 16:00:00 via INTRAVENOUS

## 2011-10-01 MED ORDER — ATORVASTATIN CALCIUM 80 MG PO TABS
80.0000 mg | ORAL_TABLET | ORAL | Status: AC
  Administered 2011-10-01: 80 mg via ORAL
  Filled 2011-10-01: qty 1

## 2011-10-01 MED ORDER — HEPARIN BOLUS VIA INFUSION
4000.0000 [IU] | Freq: Once | INTRAVENOUS | Status: AC
  Administered 2011-10-01: 4000 [IU] via INTRAVENOUS

## 2011-10-01 MED ORDER — SODIUM CHLORIDE 0.9 % IV SOLN
INTRAVENOUS | Status: AC
  Administered 2011-10-01: 22:00:00 via INTRAVENOUS

## 2011-10-01 MED ORDER — ATORVASTATIN CALCIUM 80 MG PO TABS: 80.0000 mg | ORAL_TABLET | ORAL | Status: DC

## 2011-10-01 MED ORDER — LORAZEPAM 0.5 MG PO TABS
1.0000 mg | ORAL_TABLET | Freq: Four times a day (QID) | ORAL | Status: DC | PRN
  Administered 2011-10-02: 1 mg via ORAL
  Filled 2011-10-01: qty 2

## 2011-10-01 MED ORDER — TICAGRELOR 90 MG PO TABS
90.0000 mg | ORAL_TABLET | Freq: Two times a day (BID) | ORAL | Status: DC
  Filled 2011-10-01: qty 1

## 2011-10-01 MED ORDER — SODIUM CHLORIDE 0.9 % IV SOLN: INTRAVENOUS | Status: DC

## 2011-10-01 MED ORDER — ONDANSETRON HCL 4 MG PO TABS: 4.0000 mg | ORAL_TABLET | Freq: Four times a day (QID) | ORAL | Status: DC | PRN

## 2011-10-01 MED ORDER — ASPIRIN 81 MG PO CHEW
324.0000 mg | CHEWABLE_TABLET | Freq: Once | ORAL | Status: AC
  Administered 2011-10-01: 324 mg via ORAL
  Filled 2011-10-01: qty 4

## 2011-10-01 MED ORDER — DIAZEPAM 5 MG PO TABS
5.0000 mg | ORAL_TABLET | ORAL | Status: AC
  Administered 2011-10-01: 5 mg via ORAL
  Filled 2011-10-01: qty 1

## 2011-10-01 MED ORDER — ONDANSETRON HCL 4 MG/2ML IJ SOLN
4.0000 mg | Freq: Once | INTRAMUSCULAR | Status: AC
Start: 2011-10-01 — End: 2011-10-01
  Administered 2011-10-01: 4 mg via INTRAVENOUS
  Filled 2011-10-01: qty 2

## 2011-10-01 MED ORDER — FOLIC ACID 1 MG PO TABS
1.0000 mg | ORAL_TABLET | Freq: Every day | ORAL | Status: DC
  Administered 2011-10-01 – 2011-10-03 (×3): 1 mg via ORAL
  Filled 2011-10-01 (×3): qty 1

## 2011-10-01 MED ORDER — LIDOCAINE HCL (PF) 1 % IJ SOLN
INTRAMUSCULAR | Status: AC
  Filled 2011-10-01: qty 30

## 2011-10-01 MED ORDER — VITAMIN B-1 100 MG PO TABS
100.0000 mg | ORAL_TABLET | Freq: Every day | ORAL | Status: DC
  Administered 2011-10-02 – 2011-10-03 (×2): 100 mg via ORAL
  Filled 2011-10-01 (×3): qty 1

## 2011-10-01 MED ORDER — METOPROLOL TARTRATE 25 MG PO TABS
25.0000 mg | ORAL_TABLET | Freq: Two times a day (BID) | ORAL | Status: DC
  Administered 2011-10-02 (×3): 25 mg via ORAL
  Filled 2011-10-01 (×6): qty 1

## 2011-10-01 NOTE — H&P (Signed)
Family Medicine Teaching Service Attending Note  I interviewed and examined patient [name] and reviewed their tests and x-rays.  I discussed with Dr. Fara Boros and reviewed their note for today.  I agree with their assessment and plan.     Additionally  This evening no pain but does have tenderness that reproduces that pain he had when palpate mid chest. H - regularly irregular (skipped every 3rd beat) Lungs:  Normal respiratory effort, chest expands symmetrically. Lungs are clear to auscultation, no crackles or wheezes.  Await cath per cardiology

## 2011-10-01 NOTE — Progress Notes (Signed)
ANTICOAGULATION CONSULT NOTE - Initial Consult  Pharmacy Consult for Heparin  Indication: chest pain/ACS  Allergies  Allergen Reactions  . Morphine And Related Itching    Patient Measurements:   Heparin Dosing Weight: 77.3 kg Stated height 5'10" Stated Wt 170 lb   Vital Signs: Temp: 98.6 F (37 C) (07/18 0619) Temp src: Oral (07/18 0619) BP: 150/80 mmHg (07/18 1102) Pulse Rate: 72  (07/18 1102)  Labs:  [facility] 10/01/11 0637  HGB 14.7  HCT 42.7  PLT 226  APTT --  LABPROT --  INR --  HEPARINUNFRC --  CREATININE 0.89  CKTOTAL --  CKMB --  TROPONINI <0.30    CrCl is unknown because there is no height on file for the current visit.   Medical History: Past Medical History  Diagnosis Date  . Hypertension   . Stroke   . Hyperlipidemia   . Depression   . Chronic pancreatitis   . Substance abuse       Assessment: 51 yo male who presents complaining of chest pain for past 2 days to start on heparin infusion. 2nd troponin (POC) now positive. CBC (hgb 14.7, plt 226) is ok.   Goal of Therapy:  Heparin level 0.3-0.7 units/ml Monitor platelets by anticoagulation protocol: Yes   Plan:  Give 4000 units bolus x 1 Start heparin infusion at 900 units/hr Follow up 6-h heparin level at 1800 Daily heparin level and CBC  Maudry Mayhew N 10/01/2011,11:18 AM

## 2011-10-01 NOTE — ED Notes (Addendum)
Pt cursing and using profanity in lobby stating his back hurts and is sore. Speaking aggressively to ED staff and [employer] officer

## 2011-10-01 NOTE — Progress Notes (Signed)
ANTICOAGULATION CONSULT NOTE - Follow-up Consult  Pharmacy Consult for Heparin  Indication: chest pain/ACS  Allergies  Allergen Reactions  . Morphine And Related Itching    Patient Measurements: Height: 5\' 10"  (177.8 cm) Weight: 163 lb 12.8 oz (74.3 kg) IBW/kg (Calculated) : 73  Heparin Dosing Weight: 77.3 kg Stated height 5'10" Stated Wt 170 lb   Vital Signs: Temp: 98.4 F (36.9 C) (07/18 1450) Temp src: Oral (07/18 1450) BP: 177/79 mmHg (07/18 1450) Pulse Rate: 57  (07/18 1450)  Labs:  Basename 10/01/11 1632 10/01/11 0637  HGB 13.5 14.7  HCT 40.5 42.7  PLT 211 226  APTT -- --  LABPROT 13.3 --  INR 0.99 --  HEPARINUNFRC 0.48 --  CREATININE 0.88 0.89  CKTOTAL -- --  CKMB -- --  TROPONINI -- <0.30    Estimated Creatinine Clearance: 104.8 ml/min (by C-G formula based on Cr of 0.88).   Medical History: Past Medical History  Diagnosis Date  . Hypertension   . Stroke   . Hyperlipidemia   . Depression   . Chronic pancreatitis   . Substance abuse       Assessment: 51 yo male who presents complaining of chest pain for past 2 days to start on heparin infusion. 2nd troponin (POC) now positive. CBC (hgb 14.7, plt 226) is ok. Initial heparin level is therapeutic.  Goal of Therapy:  Heparin level 0.3-0.7 units/ml Monitor platelets by anticoagulation protocol: Yes   Plan:  Continue heparin at 900 units/hr F/u am heparin level and CBC  Thanks for allowing pharmacy to be a part of this patient's care.  Talbert Cage, PharmD Clinical Pharmacist, [phone]  10/01/2011,6:46 PM

## 2011-10-01 NOTE — ED Notes (Signed)
Pt complains of left side chest pain that comes and goes that started yesterday morning. Pt states SOB and nausea/ vomiting started this morning.

## 2011-10-01 NOTE — ED Provider Notes (Signed)
History     CSN: [id number]  Arrival date & time 10/01/11  0604   First MD Initiated Contact with Patient 10/01/11 [phone]      No chief complaint on file.   (Consider location/radiation/quality/duration/timing/severity/associated sxs/prior treatment) HPI  51 year old male with history of alcoholic pancreatitis, substance abuse, and hypertension presents complaining of chest pain.  Patient reports for the past 2 days he has been experiencing intermittent sharp pain to his chest. Pain is located to the left upper chest, nonradiating, lasting for seconds. Reports pain worsening with eating, and with movement. Endorse some mild nonproductive cough, pleuritic chest pain, associated nausea and vomiting of food. Denies fever, chills, hemoptysis, abdominal pain, numbness or weakness, or rash. Has not tried anything to alleviate his symptoms. Does admits to drink alcohol, last drink yesterday afternoon. Denies any recent recreational drug use. Denies history of heart disease. Denies ever having a cardiac workup including provocative testing in the past. Does admits pain felt similar to prior episode of heart burn. Sts when he noticed cp yesterday, he thought it was heart burn.  This am when he woke up and noticing the same pain, he decided to come to ER for further evaluation.  Pain did not wake him up from sleep.   Patient also has a history of chronic back pain and back surgery. However he mentioned that his visit for today is not related to his back pain.   Past Medical History  Diagnosis Date  . Hypertension   . Stroke   . Hyperlipidemia   . Depression   . Chronic pancreatitis   . Substance abuse     Past Surgical History  Procedure Date  . Back surgery     No family history on file.  History  Substance Use Topics  . Smoking status: Current Everyday Smoker -- 0.3 packs/day    Types: Cigarettes  . Smokeless tobacco: Never Used  . Alcohol Use: Yes      Review of Systems  All  other systems reviewed and are negative.    Allergies  Review of patient's allergies indicates no known allergies.  Home Medications   Current Outpatient Rx  Name Route Sig Dispense Refill  . AMLODIPINE BESYLATE 10 MG PO TABS Oral Take 1 tablet (10 mg total) by mouth daily. 30 tablet 11  . LISINOPRIL-HYDROCHLOROTHIAZIDE 10-12.5 MG PO TABS Oral Take 1 tablet by mouth daily. 30 tablet 11  . PANTOPRAZOLE SODIUM 40 MG PO TBEC Oral Take 1 tablet (40 mg total) by mouth daily. 30 tablet 1  . PRAVASTATIN SODIUM 80 MG PO TABS Oral Take 1 tablet (80 mg total) by mouth every evening. 30 tablet 11    There were no vitals taken for this visit.  Physical Exam  Nursing note and vitals reviewed. Constitutional: He appears well-developed and well-nourished. No distress.       Awake, alert, nontoxic appearance.  tremulous  HENT:  Head: Atraumatic.  Mouth/Throat: Oropharynx is clear and moist.  Eyes: Conjunctivae are normal. Right eye exhibits no discharge. Left eye exhibits no discharge.  Neck: Normal range of motion. Neck supple.  Cardiovascular: Normal rate and regular rhythm.   Pulmonary/Chest: Effort normal. No respiratory distress. He exhibits tenderness.       Chest wall tender on palpation, L>R.  No crepitus, deformity, or overlying skin changes  Abdominal: Soft. There is no tenderness. There is no rebound.  Musculoskeletal: He exhibits no edema and no tenderness.       Cervical back: Normal.  Thoracic back: Normal.       Lumbar back: Normal.       Back:       ROM appears intact, no obvious focal weakness  Neurological: He is alert.  Skin: Skin is warm and dry. No rash noted.  Psychiatric: He has a normal mood and affect.    ED Course  Procedures (including critical care time)  Labs Reviewed - No data to display No results found.   No diagnosis found.   Date: 10/01/2011  Rate: 81  Rhythm: normal sinus rhythm, with sinus arrhythmia  QRS Axis: normal  Intervals:  normal  ST/T Wave abnormalities: normal  Conduction Disutrbances: none  Narrative Interpretation:   Old EKG Reviewed: since 07/10/10, No significant changes noted  Results for orders placed during the hospital encounter of 10/01/11  CBC WITH DIFFERENTIAL      Component Value Range   WBC 10.3  4.0 - 10.5 K/uL   RBC 4.78  4.22 - 5.81 MIL/uL   Hemoglobin 14.7  13.0 - 17.0 g/dL   HCT 45.4  09.8 - 11.9 %   MCV 89.3  78.0 - 100.0 fL   MCH 30.8  26.0 - 34.0 pg   MCHC 34.4  30.0 - 36.0 g/dL   RDW 14.7  82.9 - 56.2 %   Platelets 226  150 - 400 K/uL   Neutrophils Relative 52  43 - 77 %   Neutro Abs 5.3  1.7 - 7.7 K/uL   Lymphocytes Relative 35  12 - 46 %   Lymphs Abs 3.6  0.7 - 4.0 K/uL   Monocytes Relative 10  3 - 12 %   Monocytes Absolute 1.0  0.1 - 1.0 K/uL   Eosinophils Relative 3  0 - 5 %   Eosinophils Absolute 0.3  0.0 - 0.7 K/uL   Basophils Relative 0  0 - 1 %   Basophils Absolute 0.0  0.0 - 0.1 K/uL  COMPREHENSIVE METABOLIC PANEL      Component Value Range   Sodium 139  135 - 145 mEq/L   Potassium 4.3  3.5 - 5.1 mEq/L   Chloride 99  96 - 112 mEq/L   CO2 28  19 - 32 mEq/L   Glucose, Bld 103 (*) 70 - 99 mg/dL   BUN 13  6 - 23 mg/dL   Creatinine, Ser 1.30  0.50 - 1.35 mg/dL   Calcium 9.5  8.4 - 86.5 mg/dL   Total Protein 7.7  6.0 - 8.3 g/dL   Albumin 4.3  3.5 - 5.2 g/dL   AST 20  0 - 37 U/L   ALT 13  0 - 53 U/L   Alkaline Phosphatase 73  39 - 117 U/L   Total Bilirubin 0.6  0.3 - 1.2 mg/dL   GFR calc non Af Amer >90  >90 mL/min   GFR calc Af Amer >90  >90 mL/min  LIPASE, BLOOD      Component Value Range   Lipase 56  11 - 59 U/L  TROPONIN I      Component Value Range   Troponin I <0.30  <0.30 ng/mL  ETHANOL      Component Value Range   Alcohol, Ethyl (B) <11  0 - 11 mg/dL   Dg Chest 2 View  7/84/6962  *RADIOLOGY REPORT*  Clinical Data: Upper chest pain.  Shortness of breath.  Smoker.  CHEST - 2 VIEW  Comparison: 01/23/2009  Findings: Slightly shallow inspiration.   Normal heart size and pulmonary  vascularity.  No focal airspace consolidation or edema in the lungs.  No blunting of costophrenic angles.  No pneumothorax.  IMPRESSION: No evidence of active pulmonary disease.  Original Report Authenticated By: Marlon Pel, M.D.    1. Atypical chest pain   MDM  Atypical chest pain, reproducible on palpation, substernal. History of alcohol abuse. History of substance abuse. Symptoms suggestive of gastritis. However, full workup initiated.  Pt given asa, protonix, sl nitro.  Cardiac work up initiated.  Will consult family practice for admission.  Discussed care with my attending who agrees with plan.   7:58 AM Patient reports his symptoms improves with current treatment. His first set troponin is negative.  8:30 AM I have consulted with Dr. Fara Boros from [facility] for admission for cp r/o.  Dr. Fara Boros request to have pt move to CDU for cp protocol.  I have discussed plan of care with Trixie Dredge, PA-C who will continue management of this pt in CDU.  Pt is currently cp free and qualify for cp protocol in CDU.  Plan for coronary ct.  Pt agrees with plan.    9:56 AM Second set of troponin I is significant at 0.25. Patient endorsed very mild chest discomfort. Heparin drip dosed by pharmacy was started. I have consult with Dr. Sharyn Lull who was on call for cardiology, who recommends pt to be admit  and he will followup.  10:13 AM I have consulted with Dr. Fara Boros, who agrees to admit pt to a step down bed.  Fayrene Helper, PA-C 10/01/11 1014

## 2011-10-01 NOTE — Progress Notes (Signed)
ANTICOAGULATION CONSULT NOTE - Initial Consult  Pharmacy Consult for Heparin Indication: Restart Post CATH  Allergies  Allergen Reactions  . Morphine And Related Itching    Patient Measurements: Height: 5\' 10"  (177.8 cm) Weight: 163 lb 12.8 oz (74.3 kg) IBW/kg (Calculated) : 73  Heparin Dosing Weight: 74.3 kg  Vital Signs: Temp: 98.2 F (36.8 C) (07/18 2025) Temp src: Oral (07/18 2025) BP: 150/87 mmHg (07/18 2025) Pulse Rate: 67  (07/18 2025)  Labs:  Basename 10/01/11 1632 10/01/11 0637  HGB 13.5 14.7  HCT 40.5 42.7  PLT 211 226  APTT -- --  LABPROT 13.3 --  INR 0.99 --  HEPARINUNFRC 0.48 --  CREATININE 0.88 0.89  CKTOTAL -- --  CKMB -- --  TROPONINI -- <0.30    Estimated Creatinine Clearance: 104.8 ml/min (by C-G formula based on Cr of 0.88).   Medical History: Past Medical History  Diagnosis Date  . Hypertension   . Stroke   . Hyperlipidemia   . Depression   . Chronic pancreatitis   . Substance abuse     Medications:  Infusions:    . sodium chloride 150 mL/hr at 10/01/11 2219  . sodium chloride    . nitroGLYCERIN    . DISCONTD: sodium chloride    . DISCONTD: sodium chloride 150 mL/hr at 10/01/11 1627  . DISCONTD: heparin Stopped (10/01/11 1810)    Assessment: 89 YOM s/p Cath with 2 mid-RCA with plan for PCI eventually.  Sheath pulled at 8 pm. No hematoma and no bleeding.  Heparin level was therapeutic prior to cath on 900 units/hr.   Goal of Therapy:  Heparin level 0.3-0.7 units/ml Monitor platelets by anticoagulation protocol: Yes   Plan:  1. Restart heparin at 900 units/hr (9 mL/hr) without a bolus at 0200 AM ( 6hrs post-cath to reduce bleeding risk).   2. Heparin level if continues heparin.   Link Snuffer, PharmD, BCPS Clinical Pharmacist [phone] 10/01/2011,10:39 PM

## 2011-10-01 NOTE — ED Provider Notes (Signed)
8:53 AM Patient is to be moved to CDU for chest pain protocol.  Received sign out from Fayrene Helper, PA-C.  Pt with 2 days of chest pain, hx HTN, hyperlipidemia, stroke, alcohol abuse.  Pt thought to be low risk, concern for alcoholic gastritis.  Greta Doom notes he has spoken with patient's PCP, [facility], and agrees to chest pain protocol with coronary CT.  I will assume care of the patient upon his arrival to CDU.    Move to CDU and protocol cancelled as patient has elevated troponin.  Fayrene Helper, PA-C, aware, and will admit.    Dillard Cannon [place], [place] 10/01/11 [phone]

## 2011-10-01 NOTE — H&P (Signed)
[name] is an 51 y.o. male.   Chief Complaint: Chest pain HPI: 51 yo M with PMHx signiciant for HTN, HLD (not taking meds for either), tob use, EtOH use, and stroke 06/2011 year ago presents with atypical chest pain x2 days.  Pain is burning in nature and lasts for a few seconds.  It is worse with eating.  Has also had nausea and vomiting x2, once Tuesday evening when the pain started and once this morning.   Initially, when pain started, it was not long after he had finished eating dinner and he thought that it was reflux.  He continued to have the burning sensation on the left side of his chest yesterday, and was only able to eat 1/2 of his dinner last night, which also seemed to make the chest pain worse.  This morning, he woke up at 5am with his chest hurting and nausea.  He vomiting x1, became diaphoretic and dizzy and decided to drive himself to the ED for evaluation.  Currently, pain is 1-2/10.  Pain does not radiate, it has been constant in nature with waxing and waning.  It is not related to exertion.  He continues to drink 2-3 liquor drinks a day and smokes 1ppd x35 years.  He did try taking 1 Bayer ASA yesterday which didn't change the pain, he has not tried anything else for it prior to coming to the ED.   While in the ED, patient was given nitro, Zofran, ASA, and pepcid and pain resolved.  Initial troponin, labs, EKG (NSR, HR 81, no ST changes), and CXR were negative.  Patient's second troponin was positive, cardiology was consulted, patient was started on heparin, and we were asked to admit.    Dr. Sharyn Lull, Cardiology, saw the patient at the same time as I did and has decided to take patient to cath lab.    Past Medical History  Diagnosis Date  . Hypertension   . Stroke   . Hyperlipidemia   . Depression   . Chronic pancreatitis   . Substance abuse     Past Surgical History  Procedure Date  . Back surgery     History reviewed. No pertinent family history. Social History:   reports that he has been smoking Cigarettes.  He has been smoking about .3 packs per day. He has never used smokeless tobacco. He reports that he drinks alcohol. He reports that he does not use illicit drugs.  Allergies:  Allergies  Allergen Reactions  . Morphine And Related Itching     (Not in a hospital admission)  Results for orders placed during the hospital encounter of 10/01/11 (from the past 48 hour(s))  CBC WITH DIFFERENTIAL     Status: Normal   Collection Time   10/01/11  6:37 AM      Component Value Range Comment   WBC 10.3  4.0 - 10.5 K/uL    RBC 4.78  4.22 - 5.81 MIL/uL    Hemoglobin 14.7  13.0 - 17.0 g/dL    HCT 62.9  52.8 - 41.3 %    MCV 89.3  78.0 - 100.0 fL    MCH 30.8  26.0 - 34.0 pg    MCHC 34.4  30.0 - 36.0 g/dL    RDW 24.4  01.0 - 27.2 %    Platelets 226  150 - 400 K/uL    Neutrophils Relative 52  43 - 77 %    Neutro Abs 5.3  1.7 - 7.7 K/uL  Lymphocytes Relative 35  12 - 46 %    Lymphs Abs 3.6  0.7 - 4.0 K/uL    Monocytes Relative 10  3 - 12 %    Monocytes Absolute 1.0  0.1 - 1.0 K/uL    Eosinophils Relative 3  0 - 5 %    Eosinophils Absolute 0.3  0.0 - 0.7 K/uL    Basophils Relative 0  0 - 1 %    Basophils Absolute 0.0  0.0 - 0.1 K/uL   COMPREHENSIVE METABOLIC PANEL     Status: Abnormal   Collection Time   10/01/11  6:37 AM      Component Value Range Comment   Sodium 139  135 - 145 mEq/L    Potassium 4.3  3.5 - 5.1 mEq/L    Chloride 99  96 - 112 mEq/L    CO2 28  19 - 32 mEq/L    Glucose, Bld 103 (*) 70 - 99 mg/dL    BUN 13  6 - 23 mg/dL    Creatinine, Ser 1.61  0.50 - 1.35 mg/dL    Calcium 9.5  8.4 - 09.6 mg/dL    Total Protein 7.7  6.0 - 8.3 g/dL    Albumin 4.3  3.5 - 5.2 g/dL    AST 20  0 - 37 U/L    ALT 13  0 - 53 U/L    Alkaline Phosphatase 73  39 - 117 U/L    Total Bilirubin 0.6  0.3 - 1.2 mg/dL    GFR calc non Af Amer >90  >90 mL/min    GFR calc Af Amer >90  >90 mL/min   LIPASE, BLOOD     Status: Normal   Collection Time   10/01/11   6:37 AM      Component Value Range Comment   Lipase 56  11 - 59 U/L   TROPONIN I     Status: Normal   Collection Time   10/01/11  6:37 AM      Component Value Range Comment   Troponin I <0.30  <0.30 ng/mL   ETHANOL     Status: Normal   Collection Time   10/01/11  6:37 AM      Component Value Range Comment   Alcohol, Ethyl (B) <11  0 - 11 mg/dL   URINE RAPID DRUG SCREEN (HOSP PERFORMED)     Status: Abnormal   Collection Time   10/01/11  8:28 AM      Component Value Range Comment   Opiates POSITIVE (*) NONE DETECTED    Cocaine NONE DETECTED  NONE DETECTED    Benzodiazepines NONE DETECTED  NONE DETECTED    Amphetamines NONE DETECTED  NONE DETECTED    Tetrahydrocannabinol NONE DETECTED  NONE DETECTED    Barbiturates NONE DETECTED  NONE DETECTED   POCT I-STAT TROPONIN I     Status: Abnormal   Collection Time   10/01/11  9:15 AM      Component Value Range Comment   Troponin i, poc 0.25 (*) 0.00 - 0.08 ng/mL    Comment NOTIFIED PHYSICIAN      Comment 3             Dg Chest 2 View  10/01/2011  *RADIOLOGY REPORT*  Clinical Data: Upper chest pain.  Shortness of breath.  Smoker.  CHEST - 2 VIEW  Comparison: 01/23/2009  Findings: Slightly shallow inspiration.  Normal heart size and pulmonary vascularity.  No focal airspace consolidation or edema in the lungs.  No blunting of costophrenic angles.  No pneumothorax.  IMPRESSION: No evidence of active pulmonary disease.  Original Report Authenticated By: Marlon Pel, M.D.    Review of Systems  Constitutional: Negative for fever.  HENT: Negative for congestion and sore throat.   Eyes: Negative for blurred vision.  Respiratory: Negative for cough, sputum production, shortness of breath and wheezing.   Cardiovascular: Positive for chest pain. Negative for palpitations and leg swelling.  Gastrointestinal: Positive for heartburn, nausea, vomiting and abdominal pain. Negative for diarrhea, constipation, blood in stool and melena.    Genitourinary: Negative for dysuria.  Musculoskeletal: Positive for back pain. Negative for falls.  Skin: Negative for rash.  Neurological: Positive for dizziness. Negative for focal weakness, seizures and headaches.    Blood pressure 152/100, pulse 79, temperature 98.6 F (37 C), temperature source Oral, resp. rate 18, SpO2 99.00%. Physical Exam  Constitutional: He is oriented to person, place, and time. He appears well-developed and well-nourished. No distress.  HENT:  Head: Normocephalic and atraumatic.  Mouth/Throat: Oropharynx is clear and moist.  Eyes: Conjunctivae and EOM are normal. Pupils are equal, round, and reactive to light.  Neck: Normal range of motion. Neck supple.  Cardiovascular: Normal rate, regular rhythm and intact distal pulses.   No murmur heard. Respiratory: Effort normal and breath sounds normal. No respiratory distress. He has no wheezes. He exhibits tenderness. He exhibits no deformity.  GI: Soft. Normal appearance and bowel sounds are normal. He exhibits no distension. There is generalized tenderness. There is guarding.  Musculoskeletal: He exhibits no edema.  Lymphadenopathy:    He has no cervical adenopathy.  Neurological: He is alert and oriented to person, place, and time. No cranial nerve deficit.  Skin: Skin is warm and dry. He is not diaphoretic.     Assessment/Plan 51 yo AAM with PMH of HTN, HLD, CVA in 06/2011, tob use, EtOH abuse p/w atypical chest pain and positive 2nd troponin  1. Chest pain: with positive troponin, likely etiology is NSTEMI although does have reproducible TTP over abdomen and chest wall; unlikely to be PE with more likely etiology, no SOB, and no hypoxia  -Cariology consulted, will take to cath lab within the next 1 hour -Continue heparin per pharmacy per cardiology -Risk stratify with FLP, TSH, A1c -AM EKG -Will need RF modification (was done when pt had CVA, but pt has stopped all medicines) -Will also continue  protonix/PPI for possible gastritis component as well  2. EtOH abuse: drinks 2-3 liquor drinks per day, last drink was yesterday -CIWA protocol  3. Tobacco use: smokes 1ppd -Nicotine patch while in house -Smoking cessation counseling  4. HTN: currently elevated -not taking home medications -will need treatment once post-cath  5. HLD: was on pravastatin but no longer taking -will need to restart a statin   FEN/GI: NPO until after cath, then heart healthy; SLIV Ppx: Heparin per pharm, PPI Dispo: pending cath results, resolution of chest pain, risk stratification   Bohdi Leeds 10/01/2011, 10:34 AM

## 2011-10-01 NOTE — ED Notes (Addendum)
Pt with an elevated trop. Provider informed, no distress noted at this time. Pt denies any pain.

## 2011-10-01 NOTE — ED Notes (Signed)
Pt move to cdu cancelled at this time due to elevated troponin,.

## 2011-10-01 NOTE — ED Notes (Signed)
Notified Dr. Bernette Mayers about abnormal troponin results

## 2011-10-01 NOTE — ED Provider Notes (Signed)
Pt was seen and evaluated by Dr. Dierdre Highman prior to my arrival. Initially planned for CDU protocol, but troponin was positive. Seen in ED by Dr. Sharyn Lull and [facility].  Charles B. Bernette Mayers, MD 10/01/11 [phone]

## 2011-10-01 NOTE — ED Notes (Signed)
Cardiology at the bedside.

## 2011-10-01 NOTE — CV Procedure (Signed)
Left cardiac cath report dictated on 10/01/2011 dictation number is [id number]

## 2011-10-01 NOTE — Consult Note (Signed)
Reason for Consult: Chest pain/mildly elevated troponin I. Referring Physician: Dr. Nelva Bush is an 51 y.o. male.  HPI: Patient is 51 year old male with past medical history significant for hypertension history of CVA hypercholesteremia history of alcoholic pancreatitis tobacco abuse alcohol abuse degenerative joint disease came to the ER complaining of recurrent retrosternal and left-sided chest pain grade 8/10 associated with the vomiting and mild shortness of breath for last 2 days patient denies any palpitation lightheadedness or syncope patient denies any PND orthopnea leg swelling patient received sublingual nitroglycerin with relief of chest pain her patient was noted to have minimally elevated troponin I. in ER. Patient states chest pain also increases her occasionally with deep breathing and movement. Patient denies any cough fever chills.  Past Medical History  Diagnosis Date  . Hypertension   . Stroke   . Hyperlipidemia   . Depression   . Chronic pancreatitis   . Substance abuse     Past Surgical History  Procedure Date  . Back surgery     History reviewed. No pertinent family history.  Social History:  reports that he has been smoking Cigarettes.  He has been smoking about .3 packs per day. He has never used smokeless tobacco. He reports that he drinks alcohol. He reports that he does not use illicit drugs.  Allergies:  Allergies  Allergen Reactions  . Morphine And Related Itching    Medications: I have reviewed the patient's current medications.  Results for orders placed during the hospital encounter of 10/01/11 (from the past 48 hour(s))  CBC WITH DIFFERENTIAL     Status: Normal   Collection Time   10/01/11  6:37 AM      Component Value Range Comment   WBC 10.3  4.0 - 10.5 K/uL    RBC 4.78  4.22 - 5.81 MIL/uL    Hemoglobin 14.7  13.0 - 17.0 g/dL    HCT 95.2  84.1 - 32.4 %    MCV 89.3  78.0 - 100.0 fL    MCH 30.8  26.0 - 34.0 pg    MCHC 34.4  30.0  - 36.0 g/dL    RDW 40.1  02.7 - 25.3 %    Platelets 226  150 - 400 K/uL    Neutrophils Relative 52  43 - 77 %    Neutro Abs 5.3  1.7 - 7.7 K/uL    Lymphocytes Relative 35  12 - 46 %    Lymphs Abs 3.6  0.7 - 4.0 K/uL    Monocytes Relative 10  3 - 12 %    Monocytes Absolute 1.0  0.1 - 1.0 K/uL    Eosinophils Relative 3  0 - 5 %    Eosinophils Absolute 0.3  0.0 - 0.7 K/uL    Basophils Relative 0  0 - 1 %    Basophils Absolute 0.0  0.0 - 0.1 K/uL   COMPREHENSIVE METABOLIC PANEL     Status: Abnormal   Collection Time   10/01/11  6:37 AM      Component Value Range Comment   Sodium 139  135 - 145 mEq/L    Potassium 4.3  3.5 - 5.1 mEq/L    Chloride 99  96 - 112 mEq/L    CO2 28  19 - 32 mEq/L    Glucose, Bld 103 (*) 70 - 99 mg/dL    BUN 13  6 - 23 mg/dL    Creatinine, Ser 6.64  0.50 - 1.35 mg/dL  Calcium 9.5  8.4 - 10.5 mg/dL    Total Protein 7.7  6.0 - 8.3 g/dL    Albumin 4.3  3.5 - 5.2 g/dL    AST 20  0 - 37 U/L    ALT 13  0 - 53 U/L    Alkaline Phosphatase 73  39 - 117 U/L    Total Bilirubin 0.6  0.3 - 1.2 mg/dL    GFR calc non Af Amer >90  >90 mL/min    GFR calc Af Amer >90  >90 mL/min   LIPASE, BLOOD     Status: Normal   Collection Time   10/01/11  6:37 AM      Component Value Range Comment   Lipase 56  11 - 59 U/L   TROPONIN I     Status: Normal   Collection Time   10/01/11  6:37 AM      Component Value Range Comment   Troponin I <0.30  <0.30 ng/mL   ETHANOL     Status: Normal   Collection Time   10/01/11  6:37 AM      Component Value Range Comment   Alcohol, Ethyl (B) <11  0 - 11 mg/dL   URINE RAPID DRUG SCREEN (HOSP PERFORMED)     Status: Abnormal   Collection Time   10/01/11  8:28 AM      Component Value Range Comment   Opiates POSITIVE (*) NONE DETECTED    Cocaine NONE DETECTED  NONE DETECTED    Benzodiazepines NONE DETECTED  NONE DETECTED    Amphetamines NONE DETECTED  NONE DETECTED    Tetrahydrocannabinol NONE DETECTED  NONE DETECTED    Barbiturates NONE  DETECTED  NONE DETECTED   POCT I-STAT TROPONIN I     Status: Abnormal   Collection Time   10/01/11  9:15 AM      Component Value Range Comment   Troponin i, poc 0.25 (*) 0.00 - 0.08 ng/mL    Comment NOTIFIED PHYSICIAN      Comment 3              Dg Chest 2 View  10/01/2011  *RADIOLOGY REPORT*  Clinical Data: Upper chest pain.  Shortness of breath.  Smoker.  CHEST - 2 VIEW  Comparison: 01/23/2009  Findings: Slightly shallow inspiration.  Normal heart size and pulmonary vascularity.  No focal airspace consolidation or edema in the lungs.  No blunting of costophrenic angles.  No pneumothorax.  IMPRESSION: No evidence of active pulmonary disease.  Original Report Authenticated By: Marlon Pel, M.D.    Review of Systems  Constitutional: Positive for fever. Negative for chills and weight loss.  Eyes: Negative for blurred vision and double vision.  Respiratory: Negative for cough, hemoptysis, sputum production and shortness of breath.   Cardiovascular: Positive for chest pain. Negative for palpitations, claudication and leg swelling.  Gastrointestinal: Positive for vomiting. Negative for abdominal pain.  Genitourinary: Negative for dysuria.  Neurological: Negative for dizziness and headaches.   Blood pressure 150/80, pulse 72, temperature 98.6 F (37 C), temperature source Oral, resp. rate 18, SpO2 100.00%. Physical Exam  Constitutional: He is oriented to person, place, and time. He appears well-developed and well-nourished.  HENT:  Head: Normocephalic and atraumatic.  Eyes: Conjunctivae and EOM are normal. Pupils are equal, round, and reactive to light. Left eye exhibits no discharge.  Neck: Normal range of motion. Neck supple. No JVD present. No tracheal deviation present. No thyromegaly present.  Cardiovascular: Normal rate, regular rhythm, normal  heart sounds and intact distal pulses.  Exam reveals no friction rub.   No murmur heard. Respiratory: Effort normal and breath sounds  normal. No respiratory distress. He has no wheezes. He has no rales.  GI: Soft. Bowel sounds are normal. He exhibits no distension. There is no tenderness. There is no rebound and no guarding.  Musculoskeletal: He exhibits no edema and no tenderness.  Lymphadenopathy:    He has no cervical adenopathy.  Neurological: He is alert and oriented to person, place, and time.    Assessment/Plan: Atypical chest pain with minimally elevated troponin I. rule out MI Uncontrolled hypertension Hypercholesteremia History of CVA History of alcoholic pancreatitis History of gastritis EtOH abuse Tobacco abuse Degenerative joint disease Plan Agree with rule out MI protocol Continue IV heparin nitrates aspirin beta blockers and statins Discussed with patient various options of treatment i.e. noninvasive stress testing if further enzymes are negative versus left cath possible PTCA stenting its risk and benefits i.e. death MI stroke need for emergency CABG local vascular complications etc. and consented for PCI. Lexii Walsh N 10/01/2011, 11:23 AM

## 2011-10-02 ENCOUNTER — Encounter (HOSPITAL_COMMUNITY)
Admission: EM | Disposition: A | Discharge status: Home / Self Care | Payer: Self-pay | Source: Home / Self Care | Attending: Family Medicine

## 2011-10-02 ENCOUNTER — Encounter (HOSPITAL_COMMUNITY): Payer: Self-pay | Admitting: *Deleted

## 2011-10-02 HISTORY — PX: PERCUTANEOUS CORONARY STENT INTERVENTION (PCI-S): SHX5485

## 2011-10-02 LAB — CARDIAC PANEL(CRET KIN+CKTOT+MB+TROPI)
CK, MB: 11.3 ng/mL (ref 0.3–4.0)
Total CK: 197 U/L (ref 7–232)
Troponin I: 2.97 ng/mL (ref <0.30)
Troponin I: 2.42 ng/mL (ref <0.30)

## 2011-10-02 LAB — CBC
MCH: 29.8 pg (ref 26.0–34.0)
MCV: 88.8 fL (ref 78.0–100.0)
Platelets: 190 10*3/uL (ref 150–400)
RBC: 4 MIL/uL — ABNORMAL LOW (ref 4.22–5.81)
RDW: 14.2 % (ref 11.5–15.5)
WBC: 9 10*3/uL (ref 4.0–10.5)

## 2011-10-02 LAB — BASIC METABOLIC PANEL
CO2: 24 mEq/L (ref 19–32)
Glucose, Bld: 82 mg/dL (ref 70–99)
Potassium: 4.1 mEq/L (ref 3.5–5.1)
Sodium: 137 mEq/L (ref 135–145)

## 2011-10-02 LAB — POCT ACTIVATED CLOTTING TIME: Activated Clotting Time: 299 seconds

## 2011-10-02 SURGERY — PERCUTANEOUS CORONARY STENT INTERVENTION (PCI-S)
Anesthesia: LOCAL

## 2011-10-02 MED ORDER — ATORVASTATIN CALCIUM 80 MG PO TABS
80.0000 mg | ORAL_TABLET | Freq: Every day | ORAL | Status: DC
  Administered 2011-10-02: 80 mg via ORAL
  Filled 2011-10-02 (×2): qty 1

## 2011-10-02 MED ORDER — ACETAMINOPHEN 325 MG PO TABS: 650.0000 mg | ORAL_TABLET | ORAL | Status: DC | PRN

## 2011-10-02 MED ORDER — TICAGRELOR 90 MG PO TABS
90.0000 mg | ORAL_TABLET | Freq: Two times a day (BID) | ORAL | Status: DC
  Administered 2011-10-02 – 2011-10-03 (×2): 90 mg via ORAL
  Filled 2011-10-02 (×4): qty 1

## 2011-10-02 MED ORDER — HEPARIN (PORCINE) IN NACL 2-0.9 UNIT/ML-% IJ SOLN
INTRAMUSCULAR | Status: AC
  Filled 2011-10-02: qty 2000

## 2011-10-02 MED ORDER — SODIUM CHLORIDE 0.9 % IV SOLN: INTRAVENOUS | Status: DC

## 2011-10-02 MED ORDER — TICAGRELOR 90 MG PO TABS
ORAL_TABLET | ORAL | Status: AC
  Filled 2011-10-02: qty 1

## 2011-10-02 MED ORDER — FENTANYL CITRATE 0.05 MG/ML IJ SOLN
INTRAMUSCULAR | Status: AC
  Filled 2011-10-02: qty 2

## 2011-10-02 MED ORDER — NITROGLYCERIN IN D5W 200-5 MCG/ML-% IV SOLN
2.0000 ug/min | INTRAVENOUS | Status: DC
  Administered 2011-10-02: 10 ug/min via INTRAVENOUS

## 2011-10-02 MED ORDER — LIDOCAINE HCL (PF) 1 % IJ SOLN
INTRAMUSCULAR | Status: AC
  Filled 2011-10-02: qty 30

## 2011-10-02 MED ORDER — MIDAZOLAM HCL 2 MG/2ML IJ SOLN
INTRAMUSCULAR | Status: AC
  Filled 2011-10-02: qty 2

## 2011-10-02 MED ORDER — IRBESARTAN 300 MG PO TABS
300.0000 mg | ORAL_TABLET | Freq: Every day | ORAL | Status: DC
  Administered 2011-10-02 – 2011-10-03 (×2): 300 mg via ORAL
  Filled 2011-10-02 (×2): qty 1

## 2011-10-02 MED ORDER — ATORVASTATIN CALCIUM 20 MG PO TABS
20.0000 mg | ORAL_TABLET | Freq: Every day | ORAL | Status: DC
  Filled 2011-10-02: qty 1

## 2011-10-02 MED ORDER — NITROGLYCERIN 0.2 MG/ML ON CALL CATH LAB
INTRAVENOUS | Status: AC
  Filled 2011-10-02: qty 1

## 2011-10-02 MED ORDER — OXYCODONE-ACETAMINOPHEN 5-325 MG PO TABS
1.0000 | ORAL_TABLET | ORAL | Status: DC | PRN
  Administered 2011-10-02: 10:00:00 2 via ORAL
  Filled 2011-10-02: qty 2

## 2011-10-02 MED ORDER — ONDANSETRON HCL 4 MG/2ML IJ SOLN: 4.0000 mg | Freq: Four times a day (QID) | INTRAMUSCULAR | Status: DC | PRN

## 2011-10-02 MED ORDER — ASPIRIN 81 MG PO CHEW
81.0000 mg | CHEWABLE_TABLET | Freq: Every day | ORAL | Status: DC
  Administered 2011-10-03: 81 mg via ORAL
  Filled 2011-10-02: qty 1

## 2011-10-02 MED FILL — Dextrose Inj 5%: INTRAVENOUS | Qty: 50 | Status: AC

## 2011-10-02 NOTE — Progress Notes (Addendum)
Rec'd report from day RN, pt up to bathroom without assist, c/o dizzy.  Back to bed without incident.  BP 133/75.  Pt states dizziness resolved.  Pt irritable, alert and oriented x 4 but wife states patient was confused and saw someone walking outside window.  States just "ready to go home."  Last ETOH >48 hrs ago.  No palpable or visible hand tremors, hand grips strong & equal. Speech clear. No diaphoresis.  Encouraged pt to take ativan, refused at this time. Wearing nicotine patch, states it is helping curb cigarette craving.  Bed alarm on, reminded pt to use urinal or call staff for assist when needing to get up for safety.  Wife at bedside.  Discussed MI diagnosis and plan of care.  Questions answered.

## 2011-10-02 NOTE — Plan of Care (Signed)
Problem: Consults Goal: Tobacco Cessation referral if indicated Outcome: Completed/Met Date Met:  10/02/11 Discussed smoking cessation with pt, states he smokes 1 ppd, has quit in past but is going to try again.  Currently wearing nicotine patch.  Educated pt about danger of smoking while wearing patch, given tips for success and 800 support line #.  No questions voiced at this time

## 2011-10-02 NOTE — Progress Notes (Signed)
Family Medicine Teaching Service Daily Progress Note Intern Pager: [phone]  Patient name: [name] Medical record number: [id number] Date of birth: December 21, 1960 Age: 51 y.o. Gender: male  Primary Care Provider: Demetria Pore, MD  Subjective: Pt seen at bedside, recently back from stenting procedure. Feels well, no chest pain, SOB. Mild discomfort around sheath in groin but no frank pain or bleeding.  Objective: Temp:  [97.5 F (36.4 C)-98.4 F (36.9 C)] 98.3 F (36.8 C) (07/19 0908) Pulse Rate:  [57-73] 61  (07/19 0908) Resp:  [10-18] 14  (07/19 0908) BP: (112-177)/(55-93) 161/89 mmHg (07/19 0908) SpO2:  [99 %-100 %] 100 % (07/19 0908) Weight:  [163 lb 12.8 oz (74.3 kg)-166 lb 14.2 oz (75.7 kg)] 166 lb 14.2 oz (75.7 kg) (07/19 0022) Exam: General: lying in bed in NAD Cardiovascular: RRR, no murmur appreciated Respiratory: CTAB anteriorly; pt recently back in room from stenting, lying flat with sheath still in place in right femoral artery Abdomen: soft, nontender, non-distended, BS+ Extremities: distal pulses 2+ intact/symmetric; sheath from stenting procedure in place, right groin, with clean/dry/intact dressing  Laboratory:  Lab 10/02/11 0630 10/01/11 1632 10/01/11 0637  WBC 9.0 10.6* 10.3  HGB 11.9* 13.5 14.7  HCT 35.5* 40.5 42.7  PLT 190 211 226    Lab 10/02/11 0630 10/01/11 1632 10/01/11 0637  NA 137 138 139  K 4.1 4.8 4.3  CL 103 101 99  CO2 24 28 28   BUN 11 11 13   CREATININE 0.88 0.88 0.89  CALCIUM 8.6 9.2 9.5  PROT -- -- 7.7  BILITOT -- -- 0.6  ALKPHOS -- -- 73  ALT -- -- 13  AST -- -- 20  GLUCOSE 82 87 103*    Lab 10/02/11 0630 10/01/11 2228 10/01/11 0637  CKTOTAL 228 261* --  TROPONINI 2.97* 4.79* <0.30  TROPONINT -- -- --  CKMBINDEX -- -- --  Note: 7/18 and 7/19 measurements are post-cath on 7/18 prior to stenting on 7/19  Imaging/Diagnostic Tests: CXR, 7/18 @0653  IMPRESSION:  No evidence of active pulmonary disease.  Assessment/Plan:  Costantino Ramos is a 51 yo AAM with PMH of HTN, HLD, CVA in 06/2011, current tobacco use and EtOH abuse presenting with atypical chest pain and positive 2nd troponin. Admitted on 7/18.  1. Chest pain/NSTEMI: Positive troponin at admission, likely etiology is NSTEMI although pt did have reproducible TTP over abdomen and chest wall at admission. PE deemed to be less likely (no SOB, and no hypoxia). Cariology consulted, pt taken to cath lab on 7/18, stent placed 7/19 (dictation report pending, Dr. Sharyn Lull). PLAN - Monitor post-stenting. Will continue to follow cardiology recommendations; assistance with management greatly appreciated! ASA, metoprolol daily. Lipitor daily as well. Continue protonix/PPI for possible gastritis component of pain. Pt is being counseled on RF modification (on various meds at home but not taking, still smoking, etc).  2. EtOH abuse: Per history, pt drinks 2-3 liquor drinks per day and last drink was 1 day prior to admission. Continue CIWA protocol.  3. Tobacco use: Currently smoking 1ppd. Nicotine patch ordered while admitted. Smoking cessation counseling also while here.  4. HTN: Elevated at admission and currently elevated. Pt not taking home medications. Will defer HTN management to cardiology today post-stenting. Will likely restart home meds at discharge and encourage good follow-up.   5. HLD: Pt was previously on pravastatin but no longer taking. Lipitor 80 mg x1 given before cath; will start Lipitor 80 daily and encourage good foolow-up.  FEN/GI: cardiac diet Ppx: Heparin per pharm,  PPI  Dispo: pending cath results, resolution of chest pain, risk stratification   Bobbye Morton, MD PGY-1, [facility] Family Medicine FPTS Intern pager: [phone]

## 2011-10-02 NOTE — Progress Notes (Signed)
BP 161/79, SR 67.  Metoprolol was auto-held by computer at due time of 2000 last night, not scheduled to be given again until 1000 today.  25 mg Metoprolol given po at this time.  Pt alert, resting quietly, no s/s withdrawal, denies CP.  Rt groin bandage removed, level 0.  Bandaid applied.  IV NTG continues at 10 mcg/min.

## 2011-10-02 NOTE — Progress Notes (Signed)
Rt groin level 0.  Pt denies cp.  Dx NSTEMI, IV NTG to continue, IVF order clarified by Dr Sharyn Lull who was in to examine, orders rec'd to restart heparin per pharmacy. No s/s alcohol withdrawal noted.

## 2011-10-02 NOTE — Progress Notes (Addendum)
Site area: right groin  Site Prior to Removal:  Level 0  Pressure Applied For 20 MINUTES    Minutes Beginning at 1325  Manual:   yes  Patient Status During Pull:  stable  Post Pull Groin Site:  Level 0  Post Pull Instructions Given:  yes  Post Pull Pulses Present:  yes  Dressing Applied:  yes  Comments:vitals stable recorded printed and placed in chart

## 2011-10-02 NOTE — Cardiovascular Report (Signed)
NAMEKELSEY, EDMAN               ACCOUNT NO.:  [id number]  MEDICAL RECORD NO.:  [id number]  LOCATION:  [facility]                         FACILITY:  [facility]  PHYSICIAN:  Yadhira Mckneely N. Sharyn Lull, M.D. DATE OF BIRTH:  1960/05/11  DATE OF PROCEDURE:  10/01/2011 DATE OF DISCHARGE:                           CARDIAC CATHETERIZATION   PROCEDURE:  Left cardiac cath with selective left and right coronary angiography, left ventriculography via right groin using Judkins technique.  INDICATION FOR THE PROCEDURE:  Mr. Dobie is a 51 year old male with past medical history significant for hypertension, history of CVA, hypercholesteremia, history of alcoholic pancreatitis, tobacco abuse, alcohol abuse, degenerative joint disease.  He came to the ER complaining of recurrent retrosternal and left-sided chest pain, grade 8/10, associated with vomiting and mild shortness of breath for last 2 days.  The patient denies any palpitation, lightheadedness, or syncope. Denies any PND, orthopnea, or leg swelling.  The patient received sublingual nitro with relief of chest pain, and also the patient was noted to have minimally elevated troponin I in the ER.  Due to recurrent chest pain, mildly elevated troponin I and multiple risk factors, I discussed with the patient regarding left cath, possible PTCA with stenting, its risks and benefits, i.e., death, MI, stroke, need for emergency CABG, local vascular complications, etc., and consented for PCI.  PROCEDURE:  After obtaining the informed consent, the patient was brought to the cath lab and was placed on fluoroscopy table.  Right groin was prepped and draped in usual fashion.  A 1% Xylocaine was used for local anesthesia in the right groin.  With the help of thin wall needle, a 5-French arterial sheath was placed.  The sheath was aspirated and flushed.  Next, a 5-French left Judkins catheter was advanced over the wire under fluoroscopic guidance up to the ascending  aorta.  Wire was pulled out.  The catheter was aspirated and connected to the Manifold.  Catheter was further advanced and engaged into left coronary ostium.  Multiple views of the left system were taken.  Next, the catheter was disengaged and was pulled out over the wire and was replaced with a 5-French right Judkins catheter, which was advanced over the wire under fluoroscopic guidance up to the ascending aorta.  Wire was pulled out.  The catheter was aspirated and connected to the Manifold.  Catheter was further advanced and engaged into right coronary ostium.  Multiple views of the right system were taken.  Next, the catheter was disengaged and was pulled out over the wire and was replaced with a 5-French pigtail catheter, which was advanced over the wire under fluoroscopic guidance up to the ascending aorta.  Wire was pulled out.  The catheter was aspirated and connected to the Manifold. Catheter was further advanced across the aortic valve into the LV.  LV pressures were recorded.  Next, LV graft was done in 30-degree RAO position.  Post-angiographic pressures were recorded from LV and then pullback pressures were recorded from the aorta.  There was no gradient across the aortic valve.  Next, the pigtail catheter was pulled out over the wire.  Sheaths were aspirated and flushed.  FINDINGS:  LV showed good LV systolic function,  EF of 55-60%.  Left main was patent.  LAD was patent.  Diagonal 1 was moderate size, which was patent.  Left circumflex was patent. OM 1 was very very small, which was patent.  OM 2 was small, which was patent.  OM 3 was very small, which was patent.  RCA has 50-60% proximal and 85-90% mid stenosis.  PDA and PLV branches were small, which were patent.  The patient tolerated the procedure well.  There were no complications.  The patient will be scheduled for PTCA with stenting to mid RCA early tomorrow morning as there is emergency code STEMI coming to cath  lab right away.     Eduardo Osier. Sharyn Lull, M.D.     MNH/MEDQ  D:  10/01/2011  T:  10/02/2011  Job:  [id number]

## 2011-10-02 NOTE — Progress Notes (Signed)
Seen and examined.  He feels great. Agree with Dr. Casper Harrison.  Given that a stent was placed, he will likely also need Plavix at DC

## 2011-10-02 NOTE — Progress Notes (Addendum)
Pt now agrees w/ plan for anti-anxiety med, wife eagerly agrees.  Pt given 1 mg ativan po for CIWA scale score 10.  Bed alarm on, wife at bedside.  Dr Sharyn Lull in to examine, aware of med given for agitation.

## 2011-10-02 NOTE — CV Procedure (Signed)
PTCA stenting report dictated on 10/02/2011 dictation [id number]

## 2011-10-02 NOTE — Cardiovascular Report (Signed)
NAMESRIYAN, CUTTING               ACCOUNT NO.:  [id number]  MEDICAL RECORD NO.:  [id number]  LOCATION:  [facility]                         FACILITY:  [facility]  PHYSICIAN:  Emara Lichter N. Sharyn Lull, M.D. DATE OF BIRTH:  07-Apr-1960  DATE OF PROCEDURE:  10/02/2011 DATE OF DISCHARGE:                           CARDIAC CATHETERIZATION   PROCEDURE: 1. Successful percutaneous transluminal coronary angioplasty to mid     right coronary artery using 2.5 x 12 mm long trek balloon. 2. Successful deployment of 2.5 x 23 mm long Xience Xpedition drug-     eluting stent. 3. Successful postdilatation of this stent using 2.5 x 15 mm long [place]     trek balloon.  INDICATION FOR THE PROCEDURE:  Mr. Burlingame is a 51 year old male with past medical history significant for hypertension, history of CVA, hypercholesteremia, history of alcoholic pancreatitis, alcohol abuse, degenerative joint disease who came to the ER complaining of recurrent retrosternal and left-sided chest pain grade 8/10 associated vomiting and mild shortness of breath for the last 2 days.  The patient ruled in for small non-Q-wave myocardial infarction due to elevated cardiac enzymes and subsequently underwent left cardiac cath with selective left and right coronary angiography, LV graphy yesterday as per procedure report and was noted to have critical mid RCA stenosis.  PCI could not be done yesterday as we had another STEMI acute MI, so the patient is electively brought down today for PCI to mid RCA.  PROCEDURE NOTE:  After obtaining the informed consent, the patient was brought to the cath lab and was placed on fluoroscopy table.  Right groin was prepped and draped in usual fashion.  Xylocaine 1% was used for local anesthesia in the right groin.  With the help of thin wall needle, 6-French arterial sheath was placed.  The sheath was aspirated and flushed.  Next, a 6-French right guiding catheter was advanced over the wire under fluoroscopic guidance  up to the ascending aorta.  Wire was pulled out.  The catheter was aspirated and connected to the Manifold.  Catheter was further advanced and engaged into right coronary ostium.  A single view of right coronary artery was obtained.  FINDINGS:  RCA has 50-60% proximal stenosis and 85-90% mid stenosis with mild haziness with TIMI grade 3 distal flow.  PDA and PLV branches were small which were patent.  INTERVENTIONAL PROCEDURE:  Successful PTCA to mid RCA was done using 2.5 x 12 mm long trek balloon for predilatation and then 2.5 x 23 mm long Xience Xpedition drug-eluting stent was deployed in mid RCA at 11 atmospheric pressure.  The stent was post dilated using 2.5 x 15 mm long [place] trek balloon going up to 15 atmospheric pressure.  Lesion was dilated from 85-90% to 0% residual with excellent TIMI grade 3 distal flow without evidence of dissection or distal embolization.  The patient received weight based Angiomax and Brilinta prior to the procedure.  The patient tolerated the procedure well.  There were no complications.  The patient was transferred to recovery room in stable condition.     Eduardo Osier. Sharyn Lull, M.D.     MNH/MEDQ  D:  10/02/2011  T:  10/02/2011  Job:  [id number]

## 2011-10-02 NOTE — Progress Notes (Signed)
Utilization Review Completed.[name] T7/19/2013   

## 2011-10-03 LAB — CBC
Hemoglobin: 13.8 g/dL (ref 13.0–17.0)
Platelets: 198 10*3/uL (ref 150–400)
RBC: 4.55 MIL/uL (ref 4.22–5.81)
WBC: 9 10*3/uL (ref 4.0–10.5)

## 2011-10-03 LAB — BASIC METABOLIC PANEL
CO2: 27 mEq/L (ref 19–32)
Calcium: 10.2 mg/dL (ref 8.4–10.5)
Glucose, Bld: 91 mg/dL (ref 70–99)
Potassium: 4.1 mEq/L (ref 3.5–5.1)
Sodium: 138 mEq/L (ref 135–145)

## 2011-10-03 LAB — CARDIAC PANEL(CRET KIN+CKTOT+MB+TROPI)
CK, MB: 6 ng/mL — ABNORMAL HIGH (ref 0.3–4.0)
Relative Index: 3.8 — ABNORMAL HIGH (ref 0.0–2.5)
Total CK: 156 U/L (ref 7–232)

## 2011-10-03 MED ORDER — METOPROLOL TARTRATE 50 MG PO TABS: 50.0000 mg | ORAL_TABLET | Freq: Two times a day (BID) | ORAL | Status: DC

## 2011-10-03 MED ORDER — METOPROLOL TARTRATE 50 MG PO TABS
50.0000 mg | ORAL_TABLET | Freq: Two times a day (BID) | ORAL | Status: DC
  Administered 2011-10-03: 50 mg via ORAL
  Filled 2011-10-03 (×2): qty 1

## 2011-10-03 MED ORDER — NITROGLYCERIN 0.4 MG SL SUBL: 0.4000 mg | SUBLINGUAL_TABLET | SUBLINGUAL | Status: DC | PRN

## 2011-10-03 MED ORDER — AMLODIPINE BESYLATE 10 MG PO TABS: 10.0000 mg | ORAL_TABLET | Freq: Every day | ORAL | Status: DC

## 2011-10-03 MED ORDER — TICAGRELOR 90 MG PO TABS: 90.0000 mg | ORAL_TABLET | Freq: Two times a day (BID) | ORAL | Status: DC

## 2011-10-03 MED ORDER — THIAMINE HCL 100 MG PO TABS: 100.0000 mg | ORAL_TABLET | Freq: Every day | ORAL | Status: DC

## 2011-10-03 MED ORDER — ASPIRIN EC 81 MG PO TBEC: 81.0000 mg | DELAYED_RELEASE_TABLET | Freq: Every day | ORAL | Status: DC

## 2011-10-03 MED ORDER — FOLIC ACID 1 MG PO TABS: 1.0000 mg | ORAL_TABLET | Freq: Every day | ORAL | Status: DC

## 2011-10-03 MED ORDER — ATORVASTATIN CALCIUM 80 MG PO TABS: 80.0000 mg | ORAL_TABLET | Freq: Every day | ORAL | Status: DC

## 2011-10-03 NOTE — Progress Notes (Signed)
Family Medicine Teaching Service Daily Progress Note Intern Pager: [phone]  Patient name: BLANCHE GALLIEN Medical record number: [id number] Date of birth: [birth year] Age: 51 y.o. Gender: male  Primary Care Provider: Demetria Pore, MD  Subjective: Pt seen at bedside, standing and tidying room/packing his things. Very eager to go home. Feels well with no complaints.  Per nursing, Ativan given x1 overnight for CIWA score 10 documented in flowsheets. Since then, no s/s of withdrawal.  Objective: Temp:  [98 F (36.7 C)-98.8 F (37.1 C)] 98.8 F (37.1 C) (07/20 0750) Pulse Rate:  [58-76] 71  (07/20 0750) Resp:  [11-18] 13  (07/20 0750) BP: (115-159)/(46-83) 127/79 mmHg (07/20 0750) SpO2:  [98 %-100 %] 100 % (07/20 0750) Weight:  [168 lb 6.9 oz (76.4 kg)] 168 lb 6.9 oz (76.4 kg) (07/20 0006) Exam: General: standing, folding bedclothes, in NAD, pleasant in conversation Cardiovascular: RRR, no murmur appreciated Respiratory: CTAB without wheezes, normal work of breathing Abdomen: soft, BS+ Extremities: moves all extremities equally, gait normal  Laboratory:  Lab 10/03/11 0703 10/02/11 0630 10/01/11 1632  WBC 9.0 9.0 10.6*  HGB 13.8 11.9* 13.5  HCT 40.4 35.5* 40.5  PLT 198 190 211    Lab 10/03/11 0703 10/02/11 0630 10/01/11 1632 10/01/11 0637  NA 138 137 138 --  K 4.1 4.1 4.8 --  CL 100 103 101 --  CO2 27 24 28  --  BUN 11 11 11  --  CREATININE 1.05 0.88 0.88 --  CALCIUM 10.2 8.6 9.2 --  PROT -- -- -- 7.7  BILITOT -- -- -- 0.6  ALKPHOS -- -- -- 73  ALT -- -- -- 13  AST -- -- -- 20  GLUCOSE 91 82 87 --    Lab 10/03/11 0705 10/02/11 1430 10/02/11 0630  CKTOTAL 156 197 228  TROPONINI 1.22* 2.42* 2.97*  TROPONINT -- -- --  CKMBINDEX -- -- --  Note: 7/19-7/20 measurements are post-cath/stenting  Imaging/Diagnostic Tests: CXR, 7/18 @0653  IMPRESSION:  No evidence of active pulmonary disease.  Assessment/Plan: Eual Lindstrom is a 51 yo AAM with PMH of HTN, HLD, CVA in  06/2011, current tobacco use and EtOH abuse presenting with atypical chest pain and positive 2nd troponin. Admitted on 7/18.  1. Chest pain/NSTEMI: Positive troponin at admission, likely etiology is NSTEMI although pt did have reproducible TTP over abdomen and chest wall at admission. PE deemed to be less likely (no SOB, and no hypoxia). Cariology consulted, pt taken to cath lab on 7/18, stent placed 7/19 (dictation report pending, Dr. Sharyn Lull). PLAN - Will continue to follow cardiology recommendations; assistance with management greatly appreciated! ASA, metoprolol daily. Lipitor daily as well. Continue protonix/PPI for possible gastritis component of pain. Pt is being counseled on RF modification (on various meds at home but not taking, still smoking, etc) and will need good follow-up.  2. EtOH abuse: Per history, pt drinks 2-3 liquor drinks per day and last drink was 1 day prior to admission. One dose of Ativan required overnight, 7/19-7/20 with CIWA score of 10 documented in chart. Continue CIWA protocol until discharge. Discussed with pt; per him, the s/s prompting Ativan given last night "had nothing to them, that was what my wife was saying I needed." Pt also states that he might "stop drinking every day so much as I have been" but also states in no uncertain terms that he does not plan on stopping drinking completely. Given this, will not prescribe benzo as a taper on discharge due to risk of benzo abuse along  with alcohol, simultaneously.  3. Tobacco use: Currently smoking 1ppd. Nicotine patch ordered while admitted. Smoking cessation counseling also while here.  4. HTN: Elevated at admission and currently elevated. Pt not taking home medications. Will defer HTN management to cardiology today post-stenting. Will likely restart home meds at discharge and encourage good follow-up.   5. HLD: Pt was previously on pravastatin but no longer taking. Lipitor 80 mg x1 given before cath; will start Lipitor  80 daily and encourage good foolow-up.  FEN/GI: cardiac diet Ppx: PPI  Dispo: home today pending any further cards recommendations   Bobbye Morton, MD PGY-1, [facility] Family Medicine FPTS Intern pager: [phone]

## 2011-10-03 NOTE — Discharge Summary (Signed)
Seen and examined.  Agree with DC as outlined by Dr. Hess.   

## 2011-10-03 NOTE — Progress Notes (Signed)
Seen and examined.  Spoke with both residents and Dr. Sharyn Lull.  Appreciate his great help.  OK to DC today.

## 2011-10-03 NOTE — Discharge Summary (Signed)
Physician Discharge Summary  Patient ID: [name] MRN: [id number] DOB: 05-Dec-1960 Age: 51 y.o.  Admit date: 10/01/2011 Discharge date: 10/03/2011 Admitting Physician: Sanjuana Letters, MD  PCP: Demetria Pore, MD  Consultants:Cardiology     Discharge Diagnosis:  Principal Problem:  *NSTEMI (non-ST elevated myocardial infarction) Active Problems:  Tobacco abuse  Hypertension  Hyperlipidemia  Chest pain  Alcohol abuse    Hospital Course [name] is a 51 yo AAM with PMH of HTN, HLD, CVA in 06/2011, current tobacco use and EtOH abuse presenting with atypical chest pain and positive 2nd troponin who was admitted on 7/18 for a PCTA.  1) NSTEMI - Pt presented to the Emergency Department with CP with activity that slowly resolved at rest.  Upon further testing, pt was found to have a positive troponin at admission, likely etiology is NSTEMI although pt did have reproducible TTP over abdomen and chest wall at admission.  Cariology was consulted and decided to take pt to cath lab on 7/18 in which they placed a stent due to advanced CAD.  At time of d/c pt was stable and being sent home on ASA, Metoprolol, and Brilantin per Dr. Sharyn Lull s/p stent placement and will be following with cardiology for further management as well as FM to continue medications.  He was counseled on RF modification before admission to decreased his alcohol consumption, his smoking, and his hypercholesterolemia  2) EtOH abuse: Per history, pt drinks 2-3 liquor drinks per day and last drink was 1 day prior to admission. While in the hospital, he did require a dose of ativan 1 mg due to tachycardia and tremors.  His CIWA protocol was continued until discharge and pt was given information and counseling about alcohol cessation.  Pt also states that he might "stop drinking every day so much as I have been" but also states in no uncertain terms that he does not plan on stopping drinking completely. Given this,   did not prescribe benzodiazepines as a taper on discharge due to risk of benzo abuse along with alcohol, simultaneously.  3) Tobacco use: Currently smoking 1ppd. Nicotine patch ordered while admitted. Smoking cessation counseling also while here.   4) HTN - Pt BP on admission was elevated to 180's systolic.  He did state he had not been taking his medications.  He was started on low dose metoprolol 25 mg bid before PTCA to help control his BP better.  As well, pt was started on a Nitro drip per cardiology for the procedure and s/p cath.  This was d/c on the AM of discharge and his metoprolol was increased to 50 mg BID PO.  BP before d/c was 127/79.  5) HLD: Pt was previously on pravastatin but no longer taking.  His LDL on admission was 200 and TC was 272. Lipitor 80 mg x1 given before cath; will start Lipitor 80 daily and encourage good foolow-up.         Discharge PE   Filed Vitals:   10/03/11 0750  BP: 127/79  Pulse: 71  Temp: 98.8 F (37.1 C)  Resp: 13   Physical Exam Exam:  General: standing, folding bedclothes, in NAD, pleasant in conversation  HEENT: MMM, No scleral icterus, EOMI B/L, PERRLA Neck: No JVD present Cardiovascular: RRR, no murmur appreciated  Respiratory: CTAB without wheezes, normal work of breathing  Abdomen: soft, NABS, NT/ND Extremities: moves all extremities equally, gait normal Neuro: AAO x 3    Procedures/Imaging:  Dg Chest 2 View  10/01/2011  IMPRESSION: No evidence of active pulmonary disease.  Original Report Authenticated By: Marlon Pel, M.D.    Labs  CBC  Lab 10/03/11 0703 10/02/11 0630 10/01/11 1632  WBC 9.0 9.0 10.6*  HGB 13.8 11.9* 13.5  HCT 40.4 35.5* 40.5  PLT 198 190 211   BMET  Lab 10/03/11 0703 10/02/11 0630 10/01/11 1632 10/01/11 0637  NA 138 137 138 --  K 4.1 4.1 4.8 --  CL 100 103 101 --  CO2 27 24 28  --  BUN 11 11 11  --  CREATININE 1.05 0.88 0.88 --  CALCIUM 10.2 8.6 9.2 --  PROT -- -- -- 7.7  BILITOT -- -- --  0.6  ALKPHOS -- -- -- 73  ALT -- -- -- 13  AST -- -- -- 20  GLUCOSE 91 82 87 --   CARDIAC PANEL(CRET KIN+CKTOT+MB+TROPI)     Status: Abnormal   Collection Time   10/03/11  7:05 AM      Component Value Range Comment   Total CK 156  7 - 232 U/L    CK, MB 6.0 (*) 0.3 - 4.0 ng/mL    Troponin I 1.22 (*) <0.30 ng/mL    Relative Index 3.8 (*) 0.0 - 2.5     Lab 10/03/11 0705 10/02/11 1430 10/02/11 0630  CKTOTAL 156 197 228  TROPONINI 1.22* 2.42* 2.97*  TROPONINT -- -- --  CKMBINDEX -- -- --        Patient condition at time of discharge/disposition: stable  Disposition-home   Follow up issues: 1. Please follow up with pt on his NSEMI s/p cath and stent placement.  He should be taking his Metoprolol BID, ASA, and Plavix to prevent further Thrombosis.  He should also have an appointment with cardiology to further discuss management.  2. Please follow up on pt with his alcohol and tobacco abuse.  These are RF that will contribute further to his cardiac disease and poor overall health.  3. Please follow pt on his BP.  He admits to previous non-compliance and likely a contributing etiology to his cardiac disease.    Discharge Instructions: Please refer to Patient Instructions section of EMR for full details.  Patient was counseled important signs and symptoms that should prompt return to medical care, changes in medications, dietary instructions, activity restrictions, and follow up appointments.  Significant instructions noted below:   Discharge Orders    Future Appointments: Provider: Department: Dept Phone: Center:   10/13/2011 10:30 AM Tito Dine, MD [facility] [phone] [facility]     Future Orders Please Complete By Expires   Diet - low sodium heart healthy      Increase activity slowly      Call MD for:  persistant nausea and vomiting      Call MD for:  severe uncontrolled pain      Call MD for:  extreme fatigue      Call MD for:  difficulty breathing, headache or  visual disturbances           Discharge Medications Medication List  As of 10/03/2011 11:45 AM   START taking these medications         aspirin EC 81 MG tablet   Take 1 tablet (81 mg total) by mouth daily.      atorvastatin 80 MG tablet   Commonly known as: LIPITOR   Take 1 tablet (80 mg total) by mouth daily at 6 PM.      folic acid 1 MG tablet  Commonly known as: FOLVITE   Take 1 tablet (1 mg total) by mouth daily.      metoprolol 50 MG tablet   Commonly known as: LOPRESSOR   Take 1 tablet (50 mg total) by mouth 2 (two) times daily.      nitroGLYCERIN 0.4 MG SL tablet   Commonly known as: NITROSTAT   Place 1 tablet (0.4 mg total) under the tongue every 5 (five) minutes as needed for chest pain.      thiamine 100 MG tablet   Take 1 tablet (100 mg total) by mouth daily.      Ticagrelor 90 MG Tabs tablet   Commonly known as: BRILINTA   Take 1 tablet (90 mg total) by mouth 2 (two) times daily.         CONTINUE taking these medications         amLODipine 10 MG tablet   Commonly known as: NORVASC   Take 1 tablet (10 mg total) by mouth daily.         STOP taking these medications         pravastatin 80 MG tablet         ASK your doctor about these medications         pantoprazole 40 MG tablet   Commonly known as: PROTONIX   Take 1 tablet (40 mg total) by mouth daily.          Where to get your medications    These are the prescriptions that you need to pick up. We sent them to a specific pharmacy, so you will need to go there to get them.   [facility] [place], [place] - [address]    [address] [place] [address]    Phone: [phone]        aspirin EC 81 MG tablet   atorvastatin 80 MG tablet   folic acid 1 MG tablet   metoprolol 50 MG tablet   nitroGLYCERIN 0.4 MG SL tablet   thiamine 100 MG tablet   Ticagrelor 90 MG Tabs tablet         Information on where to get these meds is not yet available. Ask  your nurse or doctor.         amLODipine 10 MG tablet                Gildardo Cranker, DO of [facility] 10/03/2011 9:50 AM

## 2011-10-03 NOTE — Progress Notes (Signed)
Subjective:  Patient denies any chest pain or shortness of breath. Feels better after PCI to RCA. His cardiac enzymes are trending down  Objective:  Vital Signs in the last 24 hours: Temp:  [98 F (36.7 C)-98.8 F (37.1 C)] 98.8 F (37.1 C) (07/20 0750) Pulse Rate:  [58-76] 71  (07/20 0750) Resp:  [11-18] 13  (07/20 0750) BP: (115-153)/(49-83) 127/79 mmHg (07/20 0750) SpO2:  [98 %-100 %] 100 % (07/20 0750) Weight:  [76.4 kg (168 lb 6.9 oz)] 76.4 kg (168 lb 6.9 oz) (07/20 0006)  Intake/Output from previous day: 07/19 0701 - 07/20 0700 In: 283.1 [I.V.:283.1] Out: 1950 [Urine:1950] Intake/Output from this shift:    Physical Exam: Neck: no adenopathy, no carotid bruit, no JVD and supple, symmetrical, trachea midline Lungs: clear to auscultation bilaterally Heart: regular rate and rhythm, S1, S2 normal, no murmur, click, rub or gallop Abdomen: soft, non-tender; bowel sounds normal; no masses,  no organomegaly Extremities: extremities normal, atraumatic, no cyanosis or edema and Right groin is stable with no evidence of hematoma or bruit  Lab Results:  Basename 10/03/11 0703 10/02/11 0630  WBC 9.0 9.0  HGB 13.8 11.9*  PLT 198 190    Basename 10/03/11 0703 10/02/11 0630  NA 138 137  K 4.1 4.1  CL 100 103  CO2 27 24  GLUCOSE 91 82  BUN 11 11  CREATININE 1.05 0.88    Basename 10/03/11 0705 10/02/11 1430  TROPONINI 1.22* 2.42*   Hepatic Function Panel  Basename 10/01/11 0637  PROT 7.7  ALBUMIN 4.3  AST 20  ALT 13  ALKPHOS 73  BILITOT 0.6  BILIDIR --  IBILI --    Basename 10/01/11 1633  CHOL 272*   No results found for this basename: PROTIME in the last 72 hours  Imaging: Imaging results have been reviewed and No results found.  Cardiac Studies:  Assessment/Plan:  Status post non-Q-wave myocardial infarction status post PTCA stenting to mid RCA with excellent results Uncontrolled hypertension Hypercholesteremia History of CVA History of alcoholic  hepatitis History of gastritis EtOH/tobacco abuse Degenerative joint disease Plan Okay to discharge from cardiac point of view cardiac meds Aspirin 81 mg daily Brilinta 90 mg twice daily Metoprolol tartrate 50 mg twice daily Lipitor 80 mg daily And rest off blood pressure meds Patient discussed at length regarding lifestyle modification and diet and smoking cessation and compliance with medication. Also discussed regarding phase II cardiac rehabilitation. Okay to discharge from cardiac point of view followup with me in one week  LOS: 2 days    Taesha Goodell N 10/03/2011, 11:11 AM

## 2011-10-03 NOTE — Progress Notes (Signed)
CARDIAC REHAB PHASE I   PRE:  Rate/Rhythm: 70 SR  BP:  Supine:   Sitting: 139/71  Standing:    SaO2: 100% RA  MODE:  Ambulation: 900 ft   POST:  Rate/Rhythem: 102 ST  BP:  Supine:   Sitting: 157/70  Standing:    SaO2: 99% RA  0848-1005 Pt tolerated ambulation well without c/o. MI/stent education complete, MI book, stent book exercise guidelines given. Discussed smoking cessation, pt is eager to quit and in fact states that as of now he has quit smoking. Gave tobacco cessation handout and discussed the 1-800 Quit Now resource. Discussed Phase 2 Cardiac Rehab with pt, but he is not interested in participating at this time, stating he does not want to come back to the hospital. Pt verbalizes understanding of education information given.  Annetta Maw

## 2011-10-05 LAB — POCT ACTIVATED CLOTTING TIME: Activated Clotting Time: 144 seconds

## 2011-10-13 ENCOUNTER — Ambulatory Visit (INDEPENDENT_AMBULATORY_CARE_PROVIDER_SITE_OTHER): Payer: Medicare Other | Admitting: Family Medicine

## 2011-10-13 ENCOUNTER — Encounter: Payer: Self-pay | Admitting: Family Medicine

## 2011-10-13 VITALS — BP 118/77 | HR 80 | Ht 70.0 in | Wt 160.0 lb

## 2011-10-13 DIAGNOSIS — F17200 Nicotine dependence, unspecified, uncomplicated: Secondary | ICD-10-CM

## 2011-10-13 DIAGNOSIS — E785 Hyperlipidemia, unspecified: Secondary | ICD-10-CM

## 2011-10-13 DIAGNOSIS — Z720 Tobacco use: Secondary | ICD-10-CM

## 2011-10-13 DIAGNOSIS — I214 Non-ST elevation (NSTEMI) myocardial infarction: Secondary | ICD-10-CM

## 2011-10-13 DIAGNOSIS — I10 Essential (primary) hypertension: Secondary | ICD-10-CM

## 2011-10-13 DIAGNOSIS — F1010 Alcohol abuse, uncomplicated: Secondary | ICD-10-CM

## 2011-10-13 MED ORDER — NICOTINE 7 MG/24HR TD PT24: 1.0000 | MEDICATED_PATCH | TRANSDERMAL | Status: AC

## 2011-10-13 NOTE — Assessment & Plan Note (Signed)
Reports only 2 drinks since d/c. Praised pt for this.  He plans to continue to drink less.

## 2011-10-13 NOTE — Assessment & Plan Note (Signed)
BP good today.  Continue metoprolol (HR 80) and norvasc for now.  Could consider d/c'ing CCB in the future if BP well controlled.

## 2011-10-13 NOTE — Assessment & Plan Note (Signed)
Has cut down from 1 ppd to 1/2 ppd with intention to quit.  Rx'ed 7mg  nicotine patches. Encouraged pt to continue.  He declines information for [employer].

## 2011-10-13 NOTE — Assessment & Plan Note (Signed)
Tolerating statin well w/o myopathies.

## 2011-10-13 NOTE — Assessment & Plan Note (Addendum)
S/p stent. No CP since leaving hospital.  Does have some indicators of possible depression, although mild, and pt would prefer to wait to start counseling and/or antidepressant.  Denies SI/HI.   Is also questioning if he really needs to be on all of these medications forever-- I think he is having a hard time accepting that he has had MI and does require these medicines.  F/u 1-2 months mostly for mood recheck.

## 2011-10-13 NOTE — Patient Instructions (Signed)
It was great to see you.  Keep up the great work!  Come back and see me in about 2-3 months.

## 2011-10-13 NOTE — Progress Notes (Signed)
S: Pt comes in today for hospital follow up for NSTEMI s/p stent placement by Dr. Sharyn Lull.  Is taking his blood thinner and ASA.  Had f/u with Cards last week, again in 3 months.   Has not had any further chest pain.  Is taking his BP medications w/o side effects (no dizziness, SOB, CP).  Is tolerating his statin w/o muscle aches.  Continues to smoke, but has now cut down to 1/2 ppd rather than 1 ppd prior to hospital admission.  Actively trying to quit.  Using patches.  Has tried not to drink any EtOH since leaving the hospital-- has only drank 2 drinks since d/c.   Is having some difficulties with sleeping and has had decreased appetite over the past few days to week.  Denies feeling sad or depressed, denies SI/HI.  Would prefer not to start another medication at this time and declines Dr. Carola Rhine information.  He would like to wait and see how his appetite and sleep do on their own for the next month.  Is also questioning if he really needs to be on all of these medications forever-- I think he is having a hard time accepting that he has had MI and does require these medicines.    ROS: Per HPI  History  Smoking status  . Current Everyday Smoker -- 1.0 packs/day  . Types: Cigarettes  Smokeless tobacco  . Never Used    O:  Filed Vitals:   10/13/11 1038  BP: 118/77  Pulse: 80    Gen: NAD CV: RRR, no murmur Pulm: CTA bilat, no wheezes or crackles but coarse sounds throughout Ext: Warm, no chronic skin changes, no edema   A/P: 51 y.o. male p/w hospital follow up.  -See problem list -f/u in 1-2 months

## 2011-10-28 ENCOUNTER — Ambulatory Visit (INDEPENDENT_AMBULATORY_CARE_PROVIDER_SITE_OTHER): Payer: Medicare Other | Admitting: Family Medicine

## 2011-10-28 ENCOUNTER — Telehealth: Payer: Self-pay | Admitting: Psychology

## 2011-10-28 ENCOUNTER — Encounter: Payer: Self-pay | Admitting: Family Medicine

## 2011-10-28 VITALS — BP 149/82 | HR 101 | Ht 70.0 in | Wt 159.0 lb

## 2011-10-28 DIAGNOSIS — Z720 Tobacco use: Secondary | ICD-10-CM

## 2011-10-28 DIAGNOSIS — I10 Essential (primary) hypertension: Secondary | ICD-10-CM

## 2011-10-28 DIAGNOSIS — F17200 Nicotine dependence, unspecified, uncomplicated: Secondary | ICD-10-CM

## 2011-10-28 DIAGNOSIS — F1010 Alcohol abuse, uncomplicated: Secondary | ICD-10-CM

## 2011-10-28 DIAGNOSIS — F39 Unspecified mood [affective] disorder: Secondary | ICD-10-CM

## 2011-10-28 DIAGNOSIS — N529 Male erectile dysfunction, unspecified: Secondary | ICD-10-CM

## 2011-10-28 MED ORDER — BUPROPION HCL ER (XL) 150 MG PO TB24: 150.0000 mg | ORAL_TABLET | Freq: Every day | ORAL | Status: DC

## 2011-10-28 NOTE — Assessment & Plan Note (Signed)
Still smoking 1/2ppd, using nicotine patches.  Wellbutrin being added for mood may also assist with this.

## 2011-10-28 NOTE — Assessment & Plan Note (Signed)
Likely multifactorial.  Pt reports lack of desire, and ability to achieve partial erection.  I think depression/mood is having biggest imapct.  Has been occuring x1-1.5 years and pt was off of all BP meds for part of that time, however, metoprolol could be contributing.  Since pt post-MI, will leave BB on board for now while we work on other avenues.  Could also be related to EtOH and tobacco as well as previously uncontrolled HTN.  Pt could possibly be a candidate for PDE-4's in the future, although would use significant caution since he has had an MI at age 25.  Could also consider mechanical pump or local injection via urology if needed.

## 2011-10-28 NOTE — Telephone Encounter (Signed)
[name] called to request a beh med appointment upon the recommendation of Dr. Fara Boros.  I asked him to call Medicare to determine his copay for mental health as often times it is $40 per meeting and this can be cost-prohibitive.  He expressed understanding.  Scheduled for 11/09/11 at 10:00.  I told him the following: -  If he is unable to make the appointment he needs to call me. -  If he misses the appointment without a phone call, I won't be able to schedule him back in my clinic. He voiced an understanding and was able to repeat the appointment date and time back to me.

## 2011-10-28 NOTE — Patient Instructions (Signed)
It was good to see you today.  I'm sorry that you're having so many problems with your mood and intimacy issues.  I recommend that you meet with our psychologist, Dr. Spero Geralds, for help dealing with your anxiety/anger.  You can schedule an appointment with her by calling her directly at [phone].  I am starting you on a medicine to help with your mood (it should also help your sex drive and tobacco use).  Come back to see me within 2-3 weeks if you cannot see Dr. Pascal Lux within that time frame.
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
## 2011-10-28 NOTE — Assessment & Plan Note (Signed)
Start wellbutrin.  Advised pt to f/u with Dr. Pascal Lux for counseling.  If pt cannot be seen by Pascal Lux w/in 2 weeks, he will f/u with me.  Was able to contract for safety of both himself and his wife.  Encouraged to call if he starts having worsening anger/rage, feels out of control, thoughts of Si/HI, or other different feelings.

## 2011-10-28 NOTE — Assessment & Plan Note (Signed)
BP slightly elevated today, but pt obviously stressed. No changes for now, continue metoprolol and norvasc.

## 2011-10-28 NOTE — Assessment & Plan Note (Signed)
Significantly decreased since d/c from hospital.

## 2011-11-09 ENCOUNTER — Ambulatory Visit (INDEPENDENT_AMBULATORY_CARE_PROVIDER_SITE_OTHER): Payer: Medicare Other | Admitting: Psychology

## 2011-11-09 DIAGNOSIS — F1010 Alcohol abuse, uncomplicated: Secondary | ICD-10-CM

## 2011-11-09 DIAGNOSIS — Z720 Tobacco use: Secondary | ICD-10-CM

## 2011-11-09 DIAGNOSIS — F602 Antisocial personality disorder: Secondary | ICD-10-CM

## 2011-11-09 DIAGNOSIS — F17200 Nicotine dependence, unspecified, uncomplicated: Secondary | ICD-10-CM

## 2011-11-09 NOTE — Patient Instructions (Addendum)
Please schedule a follow up for 11/23/11 at 10:00. Thanks for coming in to see me today and giving me so much information. You said you need to make some changes and that you are confident that you can.  Please make a list of the changes that you think would let you have a more peaceful (interesting) life.  Bring that in next time. I am still a little confused and would like you to think about this for [place] to talk next time:  If you are done with this relationship, have no love left for your wife and just want to get out - why still the focus on her relationships with other people (if indeed she is having them)?  This doesn't make sense to me. You may want to Google "Pride" and do some reading about it.  It can be a tricky thing to manage.

## 2011-11-10 ENCOUNTER — Encounter: Payer: Self-pay | Admitting: Psychology

## 2011-11-10 DIAGNOSIS — F609 Personality disorder, unspecified: Secondary | ICD-10-CM | POA: Insufficient documentation

## 2011-11-10 NOTE — Assessment & Plan Note (Addendum)
Rollen is neatly groomed and appropriately dressed.  He sits forward in his seat, maintains good eye contact and is cooperative and attentive.  Speech is normal in tone, rate and rhythm.  Did not report on mood.  Affect seems within normal limits.  Thought process is difficult to assess.  He answers most questions with one or two words.  His thought process around the possibility of his wife's infidelity is concerning in that it seems somewhat obsessive.  There are likely some cultural issues at play here but not enough to account for his view on this situation.  Is currently denying suicidal or homicidal ideation but thinks that if he learns that his wife did have a sexual relationship with another man that he will have to do something to that person.  Does not appear to be responding to any internal stimuli.  Able to maintain train of thought and concentrate on the questions.  Judgment and insight are below average.  ? Cognitive ability.  He did complete high school.  Can read and write.    Looking for an apartment today and thinks that moving out of the house is a good thing.  I do not get the sense that this will end his quest to determine whether his wife has been unfaithful.  Thinks that his life will take a turn for the better when out on his own but has no idea what activities he will engage in.  Going to a bar or the sweepstakes place are two that sound attractive to him.    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   It is possible that a mood issue underlies his presentation but this seems more enduring.  Antisocial or schizoid personality characteristics appear to be the most predominate.    See patient instructions for further plan.

## 2011-11-10 NOTE — Assessment & Plan Note (Signed)
History of extensive use.  Family history likely positive for dependence.  Current use is occasional.  Will likely be unable to sustain this.  Abstinence is not the current goal.  Will continue to address.

## 2011-11-10 NOTE — Assessment & Plan Note (Signed)
Reports that he has cut back to 5-10 cigarettes a day.  Goal is to quit all together.  Discussed the role of Wellbutrin and that in order to be successful, he will need to make a plan to stop all together.
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
## 2011-11-23 ENCOUNTER — Ambulatory Visit (INDEPENDENT_AMBULATORY_CARE_PROVIDER_SITE_OTHER): Payer: Medicare Other | Admitting: Psychology

## 2011-11-23 DIAGNOSIS — F1010 Alcohol abuse, uncomplicated: Secondary | ICD-10-CM

## 2011-11-23 DIAGNOSIS — F609 Personality disorder, unspecified: Secondary | ICD-10-CM
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
## 2011-11-23 NOTE — Assessment & Plan Note (Signed)
Reports drinking 22 ounces of beer about 1-2 times a week.  He thinks about drinking much more often than this.  Did no further work in this area today.

## 2011-11-23 NOTE — Progress Notes (Signed)
[name] was reportedly on time for his appointment but due to problems with check-in kiosk, I wasn't called until 10:15.  My agenda included a follow-up on his homework from last visit (a list of changes he wished to make), any updates with regards to his marriage / living status, current alcohol use and his thoughts on pride (also part of his homework).  [name] started our meeting by describing continued concerns about his wife being unfaithful.  He said she goes to church early and instead of returning at her usual time, she came back after noon.  Her report that she went to the grocery and her mom's house did not "use up" enough time by [name]'s estimation and he wants to know where she was for the remaining hour.  Additionally, he has been checking her hamper for her underwear to see if he can "smell a man" on them.  They weren't there recently.  He confronted her about it and she reportedly told him that she wears her underwear for more than one day.  He does not believe this and thinks that this is evidence that she is having an affair.  He reported that he was about ready to "kill the bitch."  Spent the rest of our meeting trying to get a better understanding of his thoughts and feelings and assessing potential risk of harm to himself and her.

## 2011-11-24 ENCOUNTER — Telehealth: Payer: Self-pay | Admitting: Psychology
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
## 2011-11-30 ENCOUNTER — Ambulatory Visit (INDEPENDENT_AMBULATORY_CARE_PROVIDER_SITE_OTHER): Payer: Medicare Other | Admitting: Psychology

## 2011-11-30 DIAGNOSIS — F609 Personality disorder, unspecified: Secondary | ICD-10-CM

## 2011-11-30 DIAGNOSIS — F1010 Alcohol abuse, uncomplicated: Secondary | ICD-10-CM

## 2011-11-30 NOTE — Progress Notes (Signed)
[name] presented for follow-up with his wife, [name].  Chesley Noon [facility] Psychology Student) sat in on the session as a co-therapist.    Focus of the meeting was on giving feedback to [place] and [place] about [employer] thinking and behavior.  See previous notes for more details.

## 2011-11-30 NOTE — Assessment & Plan Note (Signed)
[name] reported that Kamron has not attempted to decrease his alcohol use ("If he has money, he will buy alcohol) which contradicts what Nicklos told me during a previous meeting.  Discussed a referral source to [facility] for treatment for substance use if he is interested.  ? What effect chronic alcohol use might have on Blu's cognition.  Might be something to monitor (MMSE) if evidence of cognitive impairment presents in the primary care environment.
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
## 2012-04-10 ENCOUNTER — Emergency Department (INDEPENDENT_AMBULATORY_CARE_PROVIDER_SITE_OTHER)
Admission: EM | Admit: 2012-04-10 | Discharge: 2012-04-10 | Disposition: A | Discharge status: Home / Self Care | Payer: Medicare Other | Source: Home / Self Care

## 2012-04-10 ENCOUNTER — Encounter (HOSPITAL_COMMUNITY): Payer: Self-pay | Admitting: *Deleted

## 2012-04-10 DIAGNOSIS — K1120 Sialoadenitis, unspecified: Secondary | ICD-10-CM

## 2012-04-10 DIAGNOSIS — R600 Localized edema: Secondary | ICD-10-CM

## 2012-04-10 DIAGNOSIS — B999 Unspecified infectious disease: Secondary | ICD-10-CM

## 2012-04-10 DIAGNOSIS — R609 Edema, unspecified: Secondary | ICD-10-CM

## 2012-04-10 DIAGNOSIS — L089 Local infection of the skin and subcutaneous tissue, unspecified: Secondary | ICD-10-CM

## 2012-04-10 HISTORY — DX: Acute myocardial infarction, unspecified: I21.9

## 2012-04-10 MED ORDER — METHYLPREDNISOLONE SODIUM SUCC 125 MG IJ SOLR
INTRAMUSCULAR | Status: AC
  Filled 2012-04-10: qty 2

## 2012-04-10 MED ORDER — CEFTRIAXONE SODIUM 1 G IJ SOLR
INTRAMUSCULAR | Status: AC
  Filled 2012-04-10: qty 10

## 2012-04-10 MED ORDER — AMOXICILLIN-POT CLAVULANATE 875-125 MG PO TABS: 1.0000 | ORAL_TABLET | Freq: Two times a day (BID) | ORAL | Status: DC

## 2012-04-10 MED ORDER — METHYLPREDNISOLONE 4 MG PO KIT: PACK | ORAL | Status: DC

## 2012-04-10 MED ORDER — LIDOCAINE HCL (PF) 1 % IJ SOLN
INTRAMUSCULAR | Status: AC
  Filled 2012-04-10: qty 5

## 2012-04-10 MED ORDER — METHYLPREDNISOLONE SODIUM SUCC 125 MG IJ SOLR
125.0000 mg | Freq: Once | INTRAMUSCULAR | Status: AC
  Administered 2012-04-10: 125 mg via INTRAVENOUS

## 2012-04-10 MED ORDER — CEFTRIAXONE SODIUM 1 G IJ SOLR
1.0000 g | Freq: Once | INTRAMUSCULAR | Status: AC
  Administered 2012-04-10: 1 g via INTRAMUSCULAR

## 2012-04-10 MED ORDER — DIPHENHYDRAMINE HCL 50 MG/ML IJ SOLN
INTRAMUSCULAR | Status: AC
  Filled 2012-04-10: qty 1

## 2012-04-10 MED ORDER — DIPHENHYDRAMINE HCL 50 MG/ML IJ SOLN
50.0000 mg | Freq: Once | INTRAMUSCULAR | Status: AC
  Administered 2012-04-10: 50 mg via INTRAVENOUS

## 2012-04-10 MED ORDER — SODIUM CHLORIDE 0.9 % IV SOLN
Freq: Once | INTRAVENOUS | Status: AC
  Administered 2012-04-10: 16:00:00 via INTRAVENOUS

## 2012-04-10 MED ORDER — EPINEPHRINE HCL 1 MG/ML IJ SOLN
INTRAMUSCULAR | Status: AC
  Filled 2012-04-10: qty 1

## 2012-04-10 NOTE — ED Notes (Signed)
States he's doing fine.  Denies any change in throat sensation.  Wife at bedside.  Side rails up x 2.

## 2012-04-10 NOTE — ED Notes (Signed)
Reports eating shrimp yesterday and feeling sick afterwards.  Last night started with facial swelling.  Face edematous.  No difficulty talking or breathing, but states his throat feels tight.  BBS clear.

## 2012-04-10 NOTE — ED Provider Notes (Signed)
History     CSN: [id number]  Arrival date & time 04/10/12  1511   First MD Initiated Contact with Patient 04/10/12 1627      Chief Complaint  Patient presents with  . Allergic Reaction    (Consider location/radiation/quality/duration/timing/severity/associated sxs/prior treatment) HPI Comments: 52 year old Hispanic male states that yesterday morning he developed swelling of the face and neck. He started to the left side of his face and then gradually spread to the neck and now there is minor swelling to the right side of his face. He denies any significant pain but states there is mild tenderness. He denies problems with his airway, swelling in the mouth, shortness of breath, rash or itching. He denies sore throat or fever. He states he is not allergic to anything that he is aware of.   Past Medical History  Diagnosis Date  . Hypertension   . Stroke   . Hyperlipidemia   . Depression   . Chronic pancreatitis   . Substance abuse   . Seizures   . Myocardial infarction     Past Surgical History  Procedure Date  . Back surgery   . Coronary stent placement     No family history on file.  History  Substance Use Topics  . Smoking status: Current Every Day Smoker -- 0.3 packs/day    Types: Cigarettes  . Smokeless tobacco: Never Used  . Alcohol Use: Yes     Comment: couple small glasses of liquor daily      Review of Systems  Constitutional: Negative for fever, chills, activity change and fatigue.  HENT: Positive for facial swelling. Negative for nosebleeds, congestion, sore throat, rhinorrhea, neck pain and neck stiffness.        Anterior neck discomfort there is swelling.  Cardiovascular: Negative.   Gastrointestinal: Negative.   Genitourinary: Negative.   Musculoskeletal: Negative.   Skin: Negative.   Neurological: Negative.   Psychiatric/Behavioral: Negative.     Allergies  Morphine and related  Home Medications   Current Outpatient Rx  Name  Route  Sig   Dispense  Refill  . AMLODIPINE BESYLATE 10 MG PO TABS   Oral   Take 1 tablet (10 mg total) by mouth daily.   30 tablet   11   . ASPIRIN EC 81 MG PO TBEC   Oral   Take 1 tablet (81 mg total) by mouth daily.   120 tablet   4   . ATORVASTATIN CALCIUM 80 MG PO TABS   Oral   Take 1 tablet (80 mg total) by mouth daily at 6 PM.   180 tablet   4   . BUPROPION HCL ER (XL) 150 MG PO TB24   Oral   Take 1 tablet (150 mg total) by mouth daily.   30 tablet   0   . METOPROLOL TARTRATE 50 MG PO TABS   Oral   Take 1 tablet (50 mg total) by mouth 2 (two) times daily.   120 tablet   6   . NITROGLYCERIN 0.4 MG SL SUBL   Sublingual   Place 1 tablet (0.4 mg total) under the tongue every 5 (five) minutes as needed for chest pain.   30 tablet   0   . TICAGRELOR 90 MG PO TABS   Oral   Take 1 tablet (90 mg total) by mouth 2 (two) times daily.   60 tablet   6   . AMLODIPINE BESYLATE 10 MG PO TABS   Oral   Take  1 tablet (10 mg total) by mouth daily.   30 tablet   11   . AMOXICILLIN-POT CLAVULANATE 875-125 MG PO TABS   Oral   Take 1 tablet by mouth every 12 (twelve) hours.   14 tablet   0   . METHYLPREDNISOLONE 4 MG PO KIT      As directed   21 tablet   0   . PANTOPRAZOLE SODIUM 40 MG PO TBEC   Oral   Take 1 tablet (40 mg total) by mouth daily.   30 tablet   1     BP 153/84  Pulse 75  Temp 99 F (37.2 C) (Oral)  Resp 16  SpO2 100%  Physical Exam  Nursing note and vitals reviewed. Constitutional: He is oriented to person, place, and time. He appears well-developed and well-nourished. No distress.  HENT:       TMs are normal no hemotympanum, erythema, bulging or retraction Oropharynx without erythema, exudates or swelling. There is no swelling or erythema to the tongue. The left buccal salivary gland meatus has mild erythema and a protuberance. There is edema to the left side of the face. This extends across the anterior neck. There is no constriction. The trachea  and thyroid is palpable and the thyroid moves up and down with swallowing.  Eyes: Conjunctivae normal and EOM are normal.  Neck: Normal range of motion. Neck supple.  Cardiovascular: Normal rate, regular rhythm and normal heart sounds.   Pulmonary/Chest: Effort normal and breath sounds normal. No respiratory distress. He has no wheezes. He has no rales.  Musculoskeletal: Normal range of motion.  Neurological: He is alert and oriented to person, place, and time. He exhibits normal muscle tone.  Skin: Skin is warm and dry. No rash noted.  Psychiatric: He has a normal mood and affect.    ED Course  Procedures (including critical care time)  Labs Reviewed - No data to display No results found.   1. Facial edema   2. Soft tissue infection   3. Sialadenitis       MDM  The patient is reevaluated at 1800 hours. After approximately 2 hours of observation and status post IV Solu-Medrol and Benadryl and IM Rocephin the patient has not developed any new symptoms. He states he does not feel like he has gotten any worse. He has no rash or itching. There is no intraoral swelling. There are actually no signs of a true allergy. Most likely this is an infectious process with a differential including the source of his poor dentition with there are several teeth with care he is and the left lower third molars which have produced or abrasions to the pupil mucosa. The other consideration is sial adenitis. There is tenderness over the salivary gland in the meatus is mildly red and more prominent. The IV is to be DC'd Prescriptions for Medrol Dosepak and Augmentin. Call your doctor tomorrow for an appointment later this week. For any worsening, new symptoms or problems or any signs of allergic reaction we discussed may return or go to the emergency department.        Hayden Rasmussen, NP 04/10/12 1824

## 2012-04-10 NOTE — ED Notes (Signed)
IV d/c by EMT per RN Iran Planas. Site is clean, dry, no redness. Site dressed with 2x2 sterile gauze and plastic tape.

## 2012-04-10 NOTE — ED Notes (Signed)
Denies any change in symptoms.  O2 removed.  Pt resting.

## 2012-04-11 NOTE — ED Provider Notes (Signed)
Medical screening examination/treatment/procedure(s) were performed by resident physician or non-physician practitioner and as supervising physician I was immediately available for consultation/collaboration.   Lilliemae Fruge DOUGLAS MD.    Colston Pyle D Ninoska Goswick, MD 04/11/12 1602 

## 2012-07-28 ENCOUNTER — Emergency Department (HOSPITAL_COMMUNITY): Payer: Medicare Other

## 2012-07-28 ENCOUNTER — Inpatient Hospital Stay (HOSPITAL_COMMUNITY)
Admission: EM | Admit: 2012-07-28 | Discharge: 2012-08-04 | DRG: 440 | Disposition: A | Discharge status: Home / Self Care | Payer: Medicare Other | Attending: Family Medicine | Admitting: Family Medicine

## 2012-07-28 ENCOUNTER — Encounter (HOSPITAL_COMMUNITY): Payer: Self-pay | Admitting: Emergency Medicine

## 2012-07-28 DIAGNOSIS — G8929 Other chronic pain: Secondary | ICD-10-CM | POA: Diagnosis present

## 2012-07-28 DIAGNOSIS — I10 Essential (primary) hypertension: Secondary | ICD-10-CM | POA: Diagnosis present

## 2012-07-28 DIAGNOSIS — F329 Major depressive disorder, single episode, unspecified: Secondary | ICD-10-CM | POA: Diagnosis present

## 2012-07-28 DIAGNOSIS — Z91199 Patient's noncompliance with other medical treatment and regimen due to unspecified reason: Secondary | ICD-10-CM

## 2012-07-28 DIAGNOSIS — Z66 Do not resuscitate: Secondary | ICD-10-CM | POA: Diagnosis present

## 2012-07-28 DIAGNOSIS — F1010 Alcohol abuse, uncomplicated: Secondary | ICD-10-CM

## 2012-07-28 DIAGNOSIS — Z9861 Coronary angioplasty status: Secondary | ICD-10-CM

## 2012-07-28 DIAGNOSIS — F3289 Other specified depressive episodes: Secondary | ICD-10-CM | POA: Diagnosis present

## 2012-07-28 DIAGNOSIS — K860 Alcohol-induced chronic pancreatitis: Secondary | ICD-10-CM

## 2012-07-28 DIAGNOSIS — M549 Dorsalgia, unspecified: Secondary | ICD-10-CM | POA: Diagnosis present

## 2012-07-28 DIAGNOSIS — K7030 Alcoholic cirrhosis of liver without ascites: Secondary | ICD-10-CM | POA: Diagnosis present

## 2012-07-28 DIAGNOSIS — K861 Other chronic pancreatitis: Secondary | ICD-10-CM | POA: Diagnosis present

## 2012-07-28 DIAGNOSIS — Z8673 Personal history of transient ischemic attack (TIA), and cerebral infarction without residual deficits: Secondary | ICD-10-CM

## 2012-07-28 DIAGNOSIS — Z9119 Patient's noncompliance with other medical treatment and regimen: Secondary | ICD-10-CM

## 2012-07-28 DIAGNOSIS — E785 Hyperlipidemia, unspecified: Secondary | ICD-10-CM | POA: Diagnosis present

## 2012-07-28 DIAGNOSIS — I252 Old myocardial infarction: Secondary | ICD-10-CM

## 2012-07-28 DIAGNOSIS — K2970 Gastritis, unspecified, without bleeding: Secondary | ICD-10-CM | POA: Diagnosis present

## 2012-07-28 DIAGNOSIS — F17200 Nicotine dependence, unspecified, uncomplicated: Secondary | ICD-10-CM | POA: Diagnosis present

## 2012-07-28 DIAGNOSIS — F1020 Alcohol dependence, uncomplicated: Secondary | ICD-10-CM | POA: Diagnosis present

## 2012-07-28 DIAGNOSIS — K859 Acute pancreatitis without necrosis or infection, unspecified: Secondary | ICD-10-CM

## 2012-07-28 DIAGNOSIS — Z720 Tobacco use: Secondary | ICD-10-CM

## 2012-07-28 LAB — CBC WITH DIFFERENTIAL/PLATELET
Lymphocytes Relative: 16 % (ref 12–46)
Lymphs Abs: 2.2 10*3/uL (ref 0.7–4.0)
Neutro Abs: 9.1 10*3/uL — ABNORMAL HIGH (ref 1.7–7.7)
Neutrophils Relative %: 65 % (ref 43–77)
Platelets: 292 10*3/uL (ref 150–400)
RBC: 4.9 MIL/uL (ref 4.22–5.81)
WBC: 14 10*3/uL — ABNORMAL HIGH (ref 4.0–10.5)

## 2012-07-28 LAB — COMPREHENSIVE METABOLIC PANEL
ALT: 10 U/L (ref 0–53)
AST: 23 U/L (ref 0–37)
Alkaline Phosphatase: 100 U/L (ref 39–117)
CO2: 27 mEq/L (ref 19–32)
GFR calc Af Amer: >90 mL/min (ref >90)
GFR calc non Af Amer: >90 mL/min (ref >90)
Glucose, Bld: 120 mg/dL — ABNORMAL HIGH (ref 70–99)
Potassium: 5 mEq/L (ref 3.5–5.1)
Sodium: 137 mEq/L (ref 135–145)

## 2012-07-28 LAB — POTASSIUM: Potassium: 4.1 mEq/L (ref 3.5–5.1)

## 2012-07-28 MED ORDER — FENTANYL CITRATE 0.05 MG/ML IJ SOLN
50.0000 ug | Freq: Once | INTRAMUSCULAR | Status: AC
  Administered 2012-07-28: 50 ug via INTRAVENOUS
  Filled 2012-07-28: qty 2

## 2012-07-28 MED ORDER — SODIUM CHLORIDE 0.9 % IV SOLN
Freq: Once | INTRAVENOUS | Status: AC
  Administered 2012-07-28: 21:00:00 via INTRAVENOUS

## 2012-07-28 MED ORDER — IOHEXOL 300 MG/ML  SOLN
100.0000 mL | Freq: Once | INTRAMUSCULAR | Status: AC | PRN
  Administered 2012-07-28: 100 mL via INTRAVENOUS

## 2012-07-28 MED ORDER — ONDANSETRON HCL 4 MG/2ML IJ SOLN
4.0000 mg | Freq: Once | INTRAMUSCULAR | Status: AC
  Administered 2012-07-28: 4 mg via INTRAVENOUS
  Filled 2012-07-28: qty 2

## 2012-07-28 MED ORDER — HEPARIN SODIUM (PORCINE) 5000 UNIT/ML IJ SOLN
5000.0000 [IU] | Freq: Three times a day (TID) | INTRAMUSCULAR | Status: DC
  Administered 2012-07-29 – 2012-08-04 (×19): 5000 [IU] via SUBCUTANEOUS
  Filled 2012-07-28 (×26): qty 1

## 2012-07-28 MED ORDER — IOHEXOL 300 MG/ML  SOLN
50.0000 mL | Freq: Once | INTRAMUSCULAR | Status: AC | PRN
  Administered 2012-07-28: 50 mL via ORAL

## 2012-07-28 NOTE — ED Notes (Signed)
Pt stated that he cannot urinate.   Will try again in 30 minutes. 

## 2012-07-28 NOTE — ED Notes (Signed)
Patient transported to CT 

## 2012-07-28 NOTE — ED Notes (Addendum)
Pt EMS pt has history of pancreatitis, abdominal pain has been going on past couple of days but today has it is worst. Pt denies any N/V or diarrhea. Pain started in Left quadrant now radiates through out abdomen and tender in all 4 quadrants.

## 2012-07-28 NOTE — ED Provider Notes (Signed)
History     CSN: [id number]  Arrival date & time 07/28/12  2012   First MD Initiated Contact with Patient 07/28/12 2014      Chief Complaint  Patient presents with  . Abdominal Pain    (Consider location/radiation/quality/duration/timing/severity/associated sxs/prior treatment) HPI Comments: Patient with recurrent LUQ pain due to pancreatitis.  Still drinks on a daily basis.  Has not taken any of his routine meds in several months because he did not want to, can not remember the last time he saw his PCP  Patient is a 52 y.o. male presenting with abdominal pain. The history is provided by the patient.  Abdominal Pain Pain location:  Epigastric and LUQ Pain quality: dull   Pain radiates to:  Does not radiate Pain severity:  Severe Onset quality:  Unable to specify Duration:  2 days Timing:  Constant Progression:  Worsening Chronicity:  Recurrent Context: alcohol use   Context: not trauma   Relieved by:  None tried Worsened by:  Nothing tried Ineffective treatments:  None tried Risk factors: alcohol abuse     Past Medical History  Diagnosis Date  . Hypertension   . Stroke   . Hyperlipidemia   . Depression   . Chronic pancreatitis   . Substance abuse   . Seizures   . Myocardial infarction     Past Surgical History  Procedure Laterality Date  . Back surgery    . Coronary stent placement      History reviewed. No pertinent family history.  History  Substance Use Topics  . Smoking status: Current Every Day Smoker -- 0.30 packs/day    Types: Cigarettes  . Smokeless tobacco: Never Used  . Alcohol Use: Yes     Comment: couple small glasses of liquor daily      Review of Systems  Gastrointestinal: Positive for abdominal pain.    Allergies  Morphine and related  Home Medications   No current outpatient prescriptions on file.  BP 151/81  Pulse 87  Temp(Src) 99.1 F (37.3 C) (Oral)  Resp 18  Ht 5\' 10"  (1.778 m)  Wt 162 lb 12.8 oz (73.846 kg)  BMI  23.36 kg/m2  SpO2 99%  Physical Exam  ED Course  Procedures (including critical care time)  Labs Reviewed  CBC WITH DIFFERENTIAL - Abnormal; Notable for the following:    WBC 14.0 (*)    Neutro Abs 9.1 (*)    Monocytes Absolute 1.3 (*)    Eosinophils Relative 10 (*)    Eosinophils Absolute 1.4 (*)    All other components within normal limits  COMPREHENSIVE METABOLIC PANEL - Abnormal; Notable for the following:    Glucose, Bld 120 (*)    All other components within normal limits  LIPASE, BLOOD - Abnormal; Notable for the following:    Lipase 1775 (*)    All other components within normal limits  ETHANOL  URINE RAPID DRUG SCREEN (HOSP PERFORMED)  LACTATE DEHYDROGENASE  POTASSIUM  COMPREHENSIVE METABOLIC PANEL  CBC  PROTIME-INR   Ct Abdomen Pelvis W Contrast  07/28/2012   *RADIOLOGY REPORT*  Clinical Data: Abdominal pain  CT ABDOMEN AND PELVIS WITH CONTRAST  Technique:  Multidetector CT imaging of the abdomen and pelvis was performed following the standard protocol during bolus administration of intravenous contrast.  Contrast: OMNIPAQUE IOHEXOL 300 MG/ML  SOLN, 50mL OMNIPAQUE IOHEXOL 300 MG/ML  SOLN  Comparison: 09/03/2010  Findings: Mild peripheral opacity left lung base, favor atelectasis or scarring.  Coronary artery calcification.  Normal  heart size.  Unremarkable liver, spleen, adrenal glands, biliary system.  There is peripancreatic fat stranding and ill-defined fluid.  Mild dilatation of the main pancreatic duct up to 4 mm within the body and tail to the level of multiple calcifications, some of which may be intraductal.  There are multiple cystic foci along the course of the body and head and neck of the pancreas.  The largest measures 2.3 cm within the body and measures water attenuation.  There are multiple punctate calcifications along the head, neck, and uncinate process of the pancreas.  Homogeneous parenchymal enhancement.  Symmetric renal enhancement.  No  hydronephrosis or hydroureter.  No CT evidence for colitis.  Normal appendix.  Small bowel loops are of normal course and caliber.  There is thickening of the posterior margin of the gastric body, abutting the pancreatic process described above (series 2 image 25).  No free intraperitoneal air.  Reactive size lymph nodes.  Scattered atherosclerosis of the aorta and branch vessels.  No appreciable aneurysmal dilatation.  The portal vein and splenic vein are patent.  Partially decompressed bladder with nonspecific wall thickening. Free fluid dependently within the pelvis measures water attenuation.  Lumbar fusion hardware.  No acute osseous finding.  IMPRESSION: Findings are in keeping with acute on chronic pancreatitis. Ectatic main pancreatic duct to the level of punctate calcifications, some of which may reflect intraductal stones.  There are multiple cystic foci along the course of the pancreas, largest of which measures 2.3 cm.  These are suspicious for pseudocysts.  No overt necrosis.  Peripancreatic fat stranding abuts the posterior margin of the gastric body, with resultant wall thickening / inflammatory changes, in keeping with gastritis.   Original Report Authenticated By: Jearld Lesch, M.D.     1. Pancreatitis       MDM    Will admit to [facility]       Arman Filter, NP 07/29/12 [phone]

## 2012-07-28 NOTE — ED Notes (Signed)
Pt continues to be made aware of need for urine sample. Urinal at pt bedside. Pt states "give me about an hour". Will continue to monitor. RN Sunny Schlein made aware

## 2012-07-28 NOTE — ED Notes (Signed)
BJY:[id number]<FA> Expected date:<BR> Expected time:<BR> Means of arrival:<BR> Comments:<BR> EMS/52 yo male with abdominal pain-hx pancreatitis

## 2012-07-29 DIAGNOSIS — K861 Other chronic pancreatitis: Secondary | ICD-10-CM

## 2012-07-29 DIAGNOSIS — K859 Acute pancreatitis without necrosis or infection, unspecified: Principal | ICD-10-CM

## 2012-07-29 DIAGNOSIS — F1010 Alcohol abuse, uncomplicated: Secondary | ICD-10-CM

## 2012-07-29 LAB — COMPREHENSIVE METABOLIC PANEL
Albumin: 3.4 g/dL — ABNORMAL LOW (ref 3.5–5.2)
BUN: 7 mg/dL (ref 6–23)
CO2: 26 mEq/L (ref 19–32)
Chloride: 100 mEq/L (ref 96–112)
Creatinine, Ser: 0.77 mg/dL (ref 0.50–1.35)
GFR calc Af Amer: >90 mL/min (ref >90)
GFR calc non Af Amer: >90 mL/min (ref >90)
Glucose, Bld: 87 mg/dL (ref 70–99)
Total Bilirubin: 1.1 mg/dL (ref 0.3–1.2)

## 2012-07-29 LAB — LIPID PANEL
Cholesterol: 169 mg/dL (ref 0–200)
HDL: 24 mg/dL — ABNORMAL LOW (ref >39)
Total CHOL/HDL Ratio: 7 RATIO
Triglycerides: 88 mg/dL (ref <150)
VLDL: 18 mg/dL (ref 0–40)

## 2012-07-29 LAB — CBC
Platelets: 253 10*3/uL (ref 150–400)
RBC: 4.29 MIL/uL (ref 4.22–5.81)
RDW: 13.6 % (ref 11.5–15.5)
WBC: 11.4 10*3/uL — ABNORMAL HIGH (ref 4.0–10.5)

## 2012-07-29 LAB — RAPID URINE DRUG SCREEN, HOSP PERFORMED
Amphetamines: NOT DETECTED
Barbiturates: NOT DETECTED
Cocaine: NOT DETECTED
Tetrahydrocannabinol: NOT DETECTED

## 2012-07-29 LAB — PROTIME-INR: Prothrombin Time: 13.7 seconds (ref 11.6–15.2)

## 2012-07-29 MED ORDER — HYDROXYZINE HCL 50 MG/ML IM SOLN: 25.0000 mg | INTRAMUSCULAR | Status: DC | PRN

## 2012-07-29 MED ORDER — MORPHINE SULFATE 4 MG/ML IJ SOLN
4.0000 mg | INTRAMUSCULAR | Status: DC | PRN
  Administered 2012-07-29 – 2012-07-30 (×3): 4 mg via INTRAVENOUS
  Filled 2012-07-29 (×3): qty 1

## 2012-07-29 MED ORDER — LORAZEPAM 2 MG/ML IJ SOLN: 0.0000 mg | Freq: Two times a day (BID) | INTRAMUSCULAR | Status: AC

## 2012-07-29 MED ORDER — NICOTINE 21 MG/24HR TD PT24
21.0000 mg | MEDICATED_PATCH | Freq: Every day | TRANSDERMAL | Status: DC | PRN
  Administered 2012-07-31 – 2012-08-01 (×2): 21 mg via TRANSDERMAL
  Filled 2012-07-29 (×3): qty 1

## 2012-07-29 MED ORDER — ONDANSETRON HCL 4 MG/2ML IJ SOLN: 4.0000 mg | Freq: Four times a day (QID) | INTRAMUSCULAR | Status: DC | PRN

## 2012-07-29 MED ORDER — THIAMINE HCL 100 MG/ML IJ SOLN
100.0000 mg | Freq: Every day | INTRAMUSCULAR | Status: DC
  Administered 2012-07-29 – 2012-08-02 (×3): 100 mg via INTRAVENOUS
  Filled 2012-07-29 (×7): qty 1

## 2012-07-29 MED ORDER — SODIUM CHLORIDE 0.9 % IV SOLN: 1.0000 mg | Freq: Once | INTRAVENOUS | Status: DC

## 2012-07-29 MED ORDER — PANTOPRAZOLE SODIUM 40 MG IV SOLR
40.0000 mg | Freq: Every day | INTRAVENOUS | Status: DC
  Administered 2012-07-29 – 2012-08-02 (×5): 40 mg via INTRAVENOUS
  Filled 2012-07-29 (×7): qty 40

## 2012-07-29 MED ORDER — LORAZEPAM 1 MG PO TABS: 1.0000 mg | ORAL_TABLET | Freq: Four times a day (QID) | ORAL | Status: AC | PRN

## 2012-07-29 MED ORDER — MORPHINE SULFATE 4 MG/ML IJ SOLN: 4.0000 mg | INTRAMUSCULAR | Status: DC | PRN

## 2012-07-29 MED ORDER — VITAMIN B-1 100 MG PO TABS
100.0000 mg | ORAL_TABLET | Freq: Every day | ORAL | Status: DC
  Administered 2012-07-31 – 2012-08-04 (×4): 100 mg via ORAL
  Filled 2012-07-29 (×8): qty 1

## 2012-07-29 MED ORDER — ONDANSETRON HCL 4 MG PO TABS: 4.0000 mg | ORAL_TABLET | Freq: Four times a day (QID) | ORAL | Status: DC | PRN

## 2012-07-29 MED ORDER — SODIUM CHLORIDE 0.9 % IV SOLN: 1.0000 mg | Freq: Every day | INTRAVENOUS | Status: DC

## 2012-07-29 MED ORDER — SODIUM CHLORIDE 0.9 % IV SOLN
INTRAVENOUS | Status: DC
  Administered 2012-07-29 – 2012-07-30 (×4): via INTRAVENOUS
  Administered 2012-07-30: 150 mL/h via INTRAVENOUS
  Administered 2012-07-30 – 2012-07-31 (×4): via INTRAVENOUS

## 2012-07-29 MED ORDER — BISACODYL 10 MG RE SUPP: 10.0000 mg | Freq: Every day | RECTAL | Status: DC | PRN

## 2012-07-29 MED ORDER — FOLIC ACID 1 MG PO TABS: 1.0000 mg | ORAL_TABLET | Freq: Every day | ORAL | Status: DC

## 2012-07-29 MED ORDER — LORAZEPAM 2 MG/ML IJ SOLN: 0.0000 mg | Freq: Four times a day (QID) | INTRAMUSCULAR | Status: AC

## 2012-07-29 MED ORDER — ADULT MULTIVITAMIN W/MINERALS CH
1.0000 | ORAL_TABLET | Freq: Every day | ORAL | Status: DC
  Administered 2012-07-30 – 2012-08-04 (×6): 1 via ORAL
  Filled 2012-07-29 (×6): qty 1

## 2012-07-29 MED ORDER — LORAZEPAM 2 MG/ML IJ SOLN
1.0000 mg | Freq: Four times a day (QID) | INTRAMUSCULAR | Status: AC | PRN
  Administered 2012-07-29: 1 mg via INTRAVENOUS
  Filled 2012-07-29: qty 1

## 2012-07-29 MED ORDER — ADULT MULTIVITAMIN W/MINERALS CH: 1.0000 | ORAL_TABLET | Freq: Every day | ORAL | Status: DC

## 2012-07-29 MED ORDER — FOLIC ACID 5 MG/ML IJ SOLN
1.0000 mg | Freq: Every day | INTRAMUSCULAR | Status: DC
  Administered 2012-07-29 – 2012-08-03 (×6): 1 mg via INTRAVENOUS
  Filled 2012-07-29 (×8): qty 0.2

## 2012-07-29 MED ORDER — DIPHENHYDRAMINE HCL 50 MG/ML IJ SOLN
12.5000 mg | Freq: Four times a day (QID) | INTRAMUSCULAR | Status: DC | PRN
  Administered 2012-07-29: 12.5 mg via INTRAVENOUS
  Filled 2012-07-29: qty 1

## 2012-07-29 NOTE — Consult Note (Signed)
Reason for Consult: Pancreatitis Referring Physician: Dr. Genia Harold is an 52 y.o. male.  HPI: Pt reports onset of pain yesterday about 1PM, which has become progressively worse.  He had similar pain last year with Pancreatitis in May of last year also.  He continues to drink and smoke about 1 PPD.  He notes he quit taking home medicines about 1 month ago, including anitplatelet agent for DES cardiac stent. Work up in the ER shows recurrent pancreatitis with lipase up to 1775. Normal LFT's, elevated WBC.  His CT scan show:  multiple cystic foci along the course of the pancreas, largest of which measures 2.3 cm. These are suspicious for  pseudocysts. No overt necrosis.  Peripancreatic fat stranding abuts the posterior margin of the gastric body, with resultant wall thickening / inflammatory  changes, in keeping with gastritis. We are ask to se and evaluate with the new finding of possible pseudocyst.  He currently has some pain and is tender, but much improved from admission yesterday.  He is currently NPO.   Past Medical History  Diagnosis Date  . Hypertension   . Stroke 06/2010   . Hyperlipidemia   . Depression/Mood disorder   . Chronic pancreatitis,    . Substance abuse, ongoing tobacco and ETOH   . Seizures  2012 No medicine since last siezure    Chronic back pain, worse, he has had 2 surgeries for this in the past.  Thigh pain with ambulation now.   . Myocardial infarction 10/02/11 with DES stent to the RCA.     Past Surgical History  Procedure Laterality Date  . Back surgery  2 prior surgeries.    . Coronary stent placement      History reviewed. No pertinent family history.  Social History:  reports that he has been smoking Cigarettes.  He has been smoking about 0.30 packs per day. He has never used smokeless tobacco. He reports that  drinks alcohol. He reports that he does not use illicit drugs. Tobacco: 1PPD for 38 years  ETOH:  Ongoing use 1/2 pt per day  Drugs:  None Pt on disability for back issues about 9 years. Allergies:  Allergies  Allergen Reactions  . Morphine And Related Itching    Medications:  Prior to Admission:  Prescriptions prior to admission  Medication Sig Dispense Refill  . amLODipine (NORVASC) 10 MG tablet Take 1 tablet (10 mg total) by mouth daily.  30 tablet  11  . pantoprazole (PROTONIX) 40 MG tablet Take 1 tablet (40 mg total) by mouth daily.  30 tablet  1   Scheduled: . folic acid  1 mg Intravenous Daily  . heparin  5,000 Units Subcutaneous Q8H  . LORazepam  0-4 mg Intravenous Q6H   Followed by  . [START ON 07/31/2012] LORazepam  0-4 mg Intravenous Q12H  . multivitamin with minerals  1 tablet Oral Daily  . pantoprazole (PROTONIX) IV  40 mg Intravenous QHS  . thiamine  100 mg Oral Daily   Or  . thiamine  100 mg Intravenous Daily   Continuous: . sodium chloride 150 mL/hr at 07/29/12 0943   ZOX:[id number], diphenhydrAMINE, LORazepam, LORazepam, morphine injection, nicotine, ondansetron (ZOFRAN) IV, ondansetron Anti-infectives   None      Results for orders placed during the hospital encounter of 07/28/12 (from the past 48 hour(s))  CBC WITH DIFFERENTIAL     Status: Abnormal   Collection Time    07/28/12  8:48 PM  Result Value Range   WBC 14.0 (*) 4.0 - 10.5 K/uL   RBC 4.90  4.22 - 5.81 MIL/uL   Hemoglobin 14.6  13.0 - 17.0 g/dL   HCT 16.1  09.6 - 04.5 %   MCV 86.9  78.0 - 100.0 fL   MCH 29.8  26.0 - 34.0 pg   MCHC 34.3  30.0 - 36.0 g/dL   RDW 40.9  81.1 - 91.4 %   Platelets 292  150 - 400 K/uL   Neutrophils Relative % 65  43 - 77 %   Neutro Abs 9.1 (*) 1.7 - 7.7 K/uL   Lymphocytes Relative 16  12 - 46 %   Lymphs Abs 2.2  0.7 - 4.0 K/uL   Monocytes Relative 9  3 - 12 %   Monocytes Absolute 1.3 (*) 0.1 - 1.0 K/uL   Eosinophils Relative 10 (*) 0 - 5 %   Eosinophils Absolute 1.4 (*) 0.0 - 0.7 K/uL   Basophils Relative 0  0 - 1 %   Basophils Absolute 0.0  0.0 - 0.1 K/uL  COMPREHENSIVE METABOLIC  PANEL     Status: Abnormal   Collection Time    07/28/12  8:48 PM      Result Value Range   Sodium 137  135 - 145 mEq/L   Potassium 5.0  3.5 - 5.1 mEq/L   Comment: SLIGHT HEMOLYSIS   Chloride 97  96 - 112 mEq/L   CO2 27  19 - 32 mEq/L   Glucose, Bld 120 (*) 70 - 99 mg/dL   BUN 9  6 - 23 mg/dL   Creatinine, Ser 7.82  0.50 - 1.35 mg/dL   Calcium 9.6  8.4 - 95.6 mg/dL   Total Protein 8.0  6.0 - 8.3 g/dL   Albumin 3.9  3.5 - 5.2 g/dL   AST 23  0 - 37 U/L   ALT 10  0 - 53 U/L   Alkaline Phosphatase 100  39 - 117 U/L   Total Bilirubin 0.8  0.3 - 1.2 mg/dL   GFR calc non Af Amer >90  >90 mL/min   GFR calc Af Amer >90  >90 mL/min   Comment:            The eGFR has been calculated     using the CKD EPI equation.     This calculation has not been     validated in all clinical     situations.     eGFR's persistently     <90 mL/min signify     possible Chronic Kidney Disease.  LIPASE, BLOOD     Status: Abnormal   Collection Time    07/28/12  8:48 PM      Result Value Range   Lipase 1775 (*) 11 - 59 U/L  ETHANOL     Status: None   Collection Time    07/28/12  8:48 PM      Result Value Range   Alcohol, Ethyl (B) <11  0 - 11 mg/dL   Comment:            LOWEST DETECTABLE LIMIT FOR     SERUM ALCOHOL IS 11 mg/dL     FOR MEDICAL PURPOSES ONLY  LACTATE DEHYDROGENASE     Status: None   Collection Time    07/28/12 10:35 PM      Result Value Range   LDH 159  94 - 250 U/L  POTASSIUM     Status: None   Collection  Time    07/28/12 10:35 PM      Result Value Range   Potassium 4.1  3.5 - 5.1 mEq/L   Comment: DELTA CHECK NOTED  URINE RAPID DRUG SCREEN (HOSP PERFORMED)     Status: None   Collection Time    07/29/12 12:49 AM      Result Value Range   Opiates NONE DETECTED  NONE DETECTED   Cocaine NONE DETECTED  NONE DETECTED   Benzodiazepines NONE DETECTED  NONE DETECTED   Amphetamines NONE DETECTED  NONE DETECTED   Tetrahydrocannabinol NONE DETECTED  NONE DETECTED   Barbiturates  NONE DETECTED  NONE DETECTED   Comment:            DRUG SCREEN FOR MEDICAL PURPOSES     ONLY.  IF CONFIRMATION IS NEEDED     FOR ANY PURPOSE, NOTIFY LAB     WITHIN 5 DAYS.                LOWEST DETECTABLE LIMITS     FOR URINE DRUG SCREEN     Drug Class       Cutoff (ng/mL)     Amphetamine      1000     Barbiturate      200     Benzodiazepine   200     Tricyclics       300     Opiates          300     Cocaine          300     THC              50  COMPREHENSIVE METABOLIC PANEL     Status: Abnormal   Collection Time    07/29/12  6:10 AM      Result Value Range   Sodium 136  135 - 145 mEq/L   Potassium 3.8  3.5 - 5.1 mEq/L   Chloride 100  96 - 112 mEq/L   CO2 26  19 - 32 mEq/L   Glucose, Bld 87  70 - 99 mg/dL   BUN 7  6 - 23 mg/dL   Creatinine, Ser 2.95  0.50 - 1.35 mg/dL   Calcium 8.9  8.4 - 62.1 mg/dL   Total Protein 6.7  6.0 - 8.3 g/dL   Albumin 3.4 (*) 3.5 - 5.2 g/dL   AST 13  0 - 37 U/L   ALT 11  0 - 53 U/L   Alkaline Phosphatase 83  39 - 117 U/L   Total Bilirubin 1.1  0.3 - 1.2 mg/dL   GFR calc non Af Amer >90  >90 mL/min   GFR calc Af Amer >90  >90 mL/min   Comment:            The eGFR has been calculated     using the CKD EPI equation.     This calculation has not been     validated in all clinical     situations.     eGFR's persistently     <90 mL/min signify     possible Chronic Kidney Disease.  CBC     Status: Abnormal   Collection Time    07/29/12  6:10 AM      Result Value Range   WBC 11.4 (*) 4.0 - 10.5 K/uL   RBC 4.29  4.22 - 5.81 MIL/uL   Hemoglobin 12.6 (*) 13.0 - 17.0 g/dL   HCT 30.8 (*) 65.7 - 84.6 %  MCV 87.2  78.0 - 100.0 fL   MCH 29.4  26.0 - 34.0 pg   MCHC 33.7  30.0 - 36.0 g/dL   RDW 16.1  09.6 - 04.5 %   Platelets 253  150 - 400 K/uL  PROTIME-INR     Status: None   Collection Time    07/29/12  6:10 AM      Result Value Range   Prothrombin Time 13.7  11.6 - 15.2 seconds   INR 1.06  0.00 - 1.49  LACTATE DEHYDROGENASE     Status:  None   Collection Time    07/29/12  6:10 AM      Result Value Range   LDH 168  94 - 250 U/L  LIPASE, BLOOD     Status: Abnormal   Collection Time    07/29/12  6:10 AM      Result Value Range   Lipase 1581 (*) 11 - 59 U/L  LIPID PANEL     Status: Abnormal   Collection Time    07/29/12  6:10 AM      Result Value Range   Cholesterol 169  0 - 200 mg/dL   Triglycerides 88  <409 mg/dL   HDL 24 (*) >81 mg/dL   Total CHOL/HDL Ratio 7.0     VLDL 18  0 - 40 mg/dL   LDL Cholesterol 191 (*) 0 - 99 mg/dL   Comment:            Total Cholesterol/HDL:CHD Risk     Coronary Heart Disease Risk Table                         Men   Women      1/2 Average Risk   3.4   3.3      Average Risk       5.0   4.4      2 X Average Risk   9.6   7.1      3 X Average Risk  23.4   11.0                Use the calculated Patient Ratio     above and the CHD Risk Table     to determine the patient's CHD Risk.                ATP III CLASSIFICATION (LDL):      <100     mg/dL   Optimal      478-295  mg/dL   Near or Above                        Optimal      130-159  mg/dL   Borderline      621-308  mg/dL   High      >657     mg/dL   Very High    Ct Abdomen Pelvis W Contrast  07/28/2012   *RADIOLOGY REPORT*  Clinical Data: Abdominal pain  CT ABDOMEN AND PELVIS WITH CONTRAST  Technique:  Multidetector CT imaging of the abdomen and pelvis was performed following the standard protocol during bolus administration of intravenous contrast.  Contrast: OMNIPAQUE IOHEXOL 300 MG/ML  SOLN, 50mL OMNIPAQUE IOHEXOL 300 MG/ML  SOLN  Comparison: 09/03/2010  Findings: Mild peripheral opacity left lung base, favor atelectasis or scarring.  Coronary artery calcification.  Normal heart size.  Unremarkable liver, spleen, adrenal glands, biliary system.  There  is peripancreatic fat stranding and ill-defined fluid.  Mild dilatation of the main pancreatic duct up to 4 mm within the body and tail to the level of multiple calcifications,  some of which may be intraductal.  There are multiple cystic foci along the course of the body and head and neck of the pancreas.  The largest measures 2.3 cm within the body and measures water attenuation.  There are multiple punctate calcifications along the head, neck, and uncinate process of the pancreas.  Homogeneous parenchymal enhancement.  Symmetric renal enhancement.  No hydronephrosis or hydroureter.  No CT evidence for colitis.  Normal appendix.  Small bowel loops are of normal course and caliber.  There is thickening of the posterior margin of the gastric body, abutting the pancreatic process described above (series 2 image 25).  No free intraperitoneal air.  Reactive size lymph nodes.  Scattered atherosclerosis of the aorta and branch vessels.  No appreciable aneurysmal dilatation.  The portal vein and splenic vein are patent.  Partially decompressed bladder with nonspecific wall thickening. Free fluid dependently within the pelvis measures water attenuation.  Lumbar fusion hardware.  No acute osseous finding.  IMPRESSION: Findings are in keeping with acute on chronic pancreatitis. Ectatic main pancreatic duct to the level of punctate calcifications, some of which may reflect intraductal stones.  There are multiple cystic foci along the course of the pancreas, largest of which measures 2.3 cm.  These are suspicious for pseudocysts.  No overt necrosis.  Peripancreatic fat stranding abuts the posterior margin of the gastric body, with resultant wall thickening / inflammatory changes, in keeping with gastritis.   Original Report Authenticated By: Jearld Lesch, M.D.    Review of Systems  Constitutional: Positive for weight loss (He says his weight goes up and down right now it's down.  He reports poor appetite.). Negative for fever, chills, malaise/fatigue and diaphoresis.       He says he doesn't do much because of his chronic back pain which is worse.  HENT: Negative.   Eyes: Negative.    Respiratory: Negative.   Cardiovascular: Negative.   Gastrointestinal: Positive for abdominal pain (Pain left uppper abdomen, going to midline.). Negative for heartburn, nausea, vomiting, diarrhea, constipation, blood in stool and melena.  Genitourinary: Negative.   Musculoskeletal: Positive for back pain (chronic).       Complains of pain back of thighs with walking.  Skin: Negative.   Neurological: Negative.  Negative for weakness.  Endo/Heme/Allergies: Negative.   Psychiatric/Behavioral: Positive for depression and substance abuse (drinkks 1/2 pint per day, and smokes all day now.).   Blood pressure 140/76, pulse 77, temperature 98.6 F (37 C), temperature source Oral, resp. rate 16, height 5\' 10"  (1.778 m), weight 73.891 kg (162 lb 14.4 oz), SpO2 98.00%. Physical Exam  Constitutional: He is oriented to person, place, and time. He appears well-developed and well-nourished. No distress.  HENT:  Head: Normocephalic and atraumatic.  Nose: Nose normal.  Mouth/Throat: Oropharynx is clear and moist.  Eyes: Conjunctivae and EOM are normal. Pupils are equal, round, and reactive to light. Right eye exhibits no discharge. Left eye exhibits no discharge. No scleral icterus.  Neck: Normal range of motion. Neck supple. No JVD present. No tracheal deviation present. No thyromegaly present.  Cardiovascular: Normal rate, regular rhythm, normal heart sounds and intact distal pulses.  Exam reveals no gallop.   No murmur heard. Respiratory: Effort normal and breath sounds normal. No respiratory distress. He has no wheezes. He has no rales.  He exhibits no tenderness.  GI: Soft. Bowel sounds are normal. He exhibits distension (minimal distension). There is tenderness (tender left upper and mid abdomen.).  Musculoskeletal: He exhibits no edema.  Lymphadenopathy:    He has no cervical adenopathy.  Neurological: He is alert and oriented to person, place, and time. No cranial nerve deficit.  Skin: Skin is  warm and dry. No rash noted. He is not diaphoretic.  Psychiatric: He has a normal mood and affect. His behavior is normal. Judgment and thought content normal.    Assessment/Plan: 1.Recurrent Pancreatitis, with ongoing ETOH use. 2. Ongoing tobacco use 3. On disability for chronic back pain 4. NSTEMI 09/2011 with DES stent, currently off antiplatelet Rx. 5.Hx of R CVA, with Left sided weakness, resolved 2012 6. Hypertension 7. Hyperlipidemia 8.Depression  Plan:  I will review with DR. Janee Morn, but at this point there is no surgical issue.  Agree with not using antibiotics at point, bowel rest, and hydration.  He needs to stop ETOH use.  The cyst is small, there is no necrosis and no indication of infection which might require any surgical intervention.  Will Marlyne Beards PA-C for Dr. Violeta Gelinas.  Kiaira Pointer 07/29/2012, 10:23 AM

## 2012-07-29 NOTE — H&P (Signed)
Family Medicine Teaching [facility] Admission History and Physical Service Pager: [phone]  Patient name: [name]  Medical record number: [id number]  Date of birth: October 24, 1960 Age: 52 y.o.    Primary Care Provider: Demetria Pore, MD  Chief Complaint: Pain  History of Present Illness: [name] is a 52 y.o.  With a history of chronic pancreatitis, alcohol abuse, HTN and hyperlipidemia presented to the [facility] ED last night after experiencing LUQ pain at about 1 pm yesterday afternoon. He tried to deal with the pain for a few hours at home, by laying down and change positions, but the pain became intolerable. He describes the pain as sharp 10/10 when it "hits" currently a 5/10 and more of nuiscince.  He as experienced pancreatitis many times in his past, with his last episode being over a year ago and was hospitalized at that time here at [facility]. He has had a appetite and ate last just before pain started. He reports no nausea, vomit or diarrhea.  He admits to daily drinking, last drink prior to ED, he drank gin. He is a smoker.  Of note: Patient is prescribed medication for HTN and reports he does not take it.  Patient Active Problem List   Diagnosis Date Noted  . Personality characteristics that affect function 11/10/2011  . Mood disorder 10/28/2011  . Alcohol abuse 10/01/2011  . NSTEMI (non-ST elevated myocardial infarction) 10/01/2011  . Erectile dysfunction 08/01/2010  . Urge incontinence of urine 08/01/2010  . Chronic alcoholic pancreatitis 07/24/2010  . Tobacco abuse 07/24/2010  . Hypertension 07/24/2010  . Hyperlipidemia 07/24/2010  . CVA (cerebral vascular accident) 07/24/2010  . Chronic back pain 07/24/2010    Past Medical History  Diagnosis Date  . Hypertension   . Stroke   . Hyperlipidemia   . Depression   . Chronic pancreatitis   . Substance abuse   . Seizures   . Myocardial infarction     Past Surgical History  Procedure Laterality Date  . Back  surgery    . Coronary stent placement      History   Social History  . Marital Status: Married    Spouse Name: N/A    Number of Children: N/A  . Years of Education: N/A   Occupational History  . Not on file.   Social History Main Topics  . Smoking status: Current Every Day Smoker -- 0.30 packs/day    Types: Cigarettes  . Smokeless tobacco: Never Used  . Alcohol Use: Yes     Comment: couple small glasses of liquor daily  . Drug Use: No  . Sexually Active: Not on file   Other Topics Concern  . Not on file   Social History Narrative   The patient lives alone with his significant other, [name].  The patient has a 55 year old daughter.  He is currently unemployed, and  has a high school degree.        He has a history of 1- 1.5 pack per day smoking of many years.  He states he has recently cut down to half pack per day (06/2010).      He has a history of heavy alcohol use, states typically drinking a fifth to a pint of gin daily, after stroke in 06/2010, decreased to one pint of gin per week, and after hospitalization for acute on chronic pancreatitis, was abstinent for at least 1-2 weeks (from 5/7 to 5/17)        He denies any illicit drug use.  History reviewed. No pertinent family history.  Allergies  Allergen Reactions  . Morphine And Related Itching    Review Of Systems: Per HPI  Otherwise 12 point review of systems was performed and was unremarkable.  Physical Exam: BP 151/81  Pulse 87  Temp(Src) 99.1 F (37.3 C) (Oral)  Resp 18  Ht 5\' 10"  (1.778 m)  Wt 162 lb 12.8 oz (73.846 kg)  BMI 23.36 kg/m2  SpO2 99%  Exam: General: Patient up walking back from bathroom. Guarded movement. NAD.  HEENT: AT.[place]. Bilateral eyes will minor icterus, no injections, xanthomas present bilaterally. Bilateral nares patent without deformity or drainage.  Cardiovascular: RRR. No murmur appreciated.  Respiratory: CTAB. No wheeze, rhonchi or crackles.  Abdomen: Soft. Flat.   Patient extremely guarded. Diffuse TTP. BS present. Unable to assess organomegaly due to discomfort, believe there is mild hepatomegaly by percussion.  Extremities: No erythema, tenderness or edema. Full ROM. Skin: No rashes or breakdown noted.  Neuro: Perla. EOMi. No focal deficits. Grossly intact CN.   Labs and Imaging: Ct Abdomen Pelvis W Contrast 07/28/2012    Findings: Mild peripheral opacity left lung base, favor atelectasis or scarring.  Coronary artery calcification.  Normal heart size.  Unremarkable liver, spleen, adrenal glands, biliary system.  There is peripancreatic fat stranding and ill-defined fluid.  Mild dilatation of the main pancreatic duct up to 4 mm within the body and tail to the level of multiple calcifications, some of which may be intraductal.  There are multiple cystic foci along the course of the body and head and neck of the pancreas.  The largest measures 2.3 cm within the body and measures water attenuation.  There are multiple punctate calcifications along the head, neck, and uncinate process of the pancreas.  Homogeneous parenchymal enhancement.  Symmetric renal enhancement.  No hydronephrosis or hydroureter.  No CT evidence for colitis.  Normal appendix.  Small bowel loops are of normal course and caliber.  There is thickening of the posterior margin of the gastric body, abutting the pancreatic process described above (series 2 image 25).  No free intraperitoneal air.  Reactive size lymph nodes.  Scattered atherosclerosis of the aorta and branch vessels.  No appreciable aneurysmal dilatation.  The portal vein and splenic vein are patent.  Partially decompressed bladder with nonspecific wall thickening. Free fluid dependently within the pelvis measures water attenuation.  Lumbar fusion hardware.  No acute osseous finding.  IMPRESSION: Findings are in keeping with acute on chronic pancreatitis. Ectatic main pancreatic duct to the level of punctate calcifications, some of which  may reflect intraductal stones.  There are multiple cystic foci along the course of the pancreas, largest of which measures 2.3 cm.  These are suspicious for pseudocysts.  No overt necrosis.  Peripancreatic fat stranding abuts the posterior margin of the gastric body, with resultant wall thickening / inflammatory changes, in keeping with gastritis.   Original Report Authenticated By: Jearld Lesch, M.D.    Results for orders placed during the hospital encounter of 07/28/12 (from the past 24 hour(s))  CBC WITH DIFFERENTIAL     Status: Abnormal   Collection Time    07/28/12  8:48 PM      Result Value Range   WBC 14.0 (*) 4.0 - 10.5 K/uL   RBC 4.90  4.22 - 5.81 MIL/uL   Hemoglobin 14.6  13.0 - 17.0 g/dL   HCT 40.9  81.1 - 91.4 %   MCV 86.9  78.0 - 100.0 fL   MCH 29.8  26.0 - 34.0 pg   MCHC 34.3  30.0 - 36.0 g/dL   RDW 16.1  09.6 - 04.5 %   Platelets 292  150 - 400 K/uL   Neutrophils Relative % 65  43 - 77 %   Neutro Abs 9.1 (*) 1.7 - 7.7 K/uL   Lymphocytes Relative 16  12 - 46 %   Lymphs Abs 2.2  0.7 - 4.0 K/uL   Monocytes Relative 9  3 - 12 %   Monocytes Absolute 1.3 (*) 0.1 - 1.0 K/uL   Eosinophils Relative 10 (*) 0 - 5 %   Eosinophils Absolute 1.4 (*) 0.0 - 0.7 K/uL   Basophils Relative 0  0 - 1 %   Basophils Absolute 0.0  0.0 - 0.1 K/uL  COMPREHENSIVE METABOLIC PANEL     Status: Abnormal   Collection Time    07/28/12  8:48 PM      Result Value Range   Sodium 137  135 - 145 mEq/L   Potassium 5.0  3.5 - 5.1 mEq/L   Chloride 97  96 - 112 mEq/L   CO2 27  19 - 32 mEq/L   Glucose, Bld 120 (*) 70 - 99 mg/dL   BUN 9  6 - 23 mg/dL   Creatinine, Ser 4.09  0.50 - 1.35 mg/dL   Calcium 9.6  8.4 - 81.1 mg/dL   Total Protein 8.0  6.0 - 8.3 g/dL   Albumin 3.9  3.5 - 5.2 g/dL   AST 23  0 - 37 U/L   ALT 10  0 - 53 U/L   Alkaline Phosphatase 100  39 - 117 U/L   Total Bilirubin 0.8  0.3 - 1.2 mg/dL   GFR calc non Af Amer >90  >90 mL/min   GFR calc Af Amer >90  >90 mL/min  LIPASE, BLOOD      Status: Abnormal   Collection Time    07/28/12  8:48 PM      Result Value Range   Lipase 1775 (*) 11 - 59 U/L  ETHANOL     Status: None   Collection Time    07/28/12  8:48 PM      Result Value Range   Alcohol, Ethyl (B) <11  0 - 11 mg/dL  LACTATE DEHYDROGENASE     Status: None   Collection Time    07/28/12 10:35 PM      Result Value Range   LDH 159  94 - 250 U/L  POTASSIUM     Status: None   Collection Time    07/28/12 10:35 PM      Result Value Range   Potassium 4.1  3.5 - 5.1 mEq/L  URINE RAPID DRUG SCREEN (HOSP PERFORMED)     Status: None   Collection Time    07/29/12 12:49 AM      Result Value Range   Opiates NONE DETECTED  NONE DETECTED   Cocaine NONE DETECTED  NONE DETECTED   Benzodiazepines NONE DETECTED  NONE DETECTED   Amphetamines NONE DETECTED  NONE DETECTED   Tetrahydrocannabinol NONE DETECTED  NONE DETECTED   Barbiturates NONE DETECTED  NONE DETECTED     Assessment and Plan: JASHER BARKAN is 52 y.o. male with known substance abuse/alcoholism admitted for acute on chronic pancreatitis.   Pancreatitis: Acute on chronic, with lipase 1775, LDH 159 and a CT reporting pancreatitis with pseudocysts and calcifications.  - NPO; hydration  with 1.25 MIVF - Morphine 4 mg Q3h for pain control; Benadryl  for itching - Consult GI in the morning.  - Lipids pending - Repeat Lipase, CBC, CMP and LDH - Protonix IV  Substance abuse: Last drink prior to ED visit, UDS negative.  - CIWA protocol - Nicotine patch  HTN: Patient reports he does not take his medication (Norvasc).  - will monitor BP and add medication as necessary  FEN/GI: NPO Prophylaxis: Hep sq Disposition: Home pending clinical improvement. Code Status: DNR  Felix Pacini DO    Family Medicine Resident PGY-1 07/29/2012, 2:39 AM  I have seen the patient and agree with the assessment and plan.   Si Raider Clinton Sawyer, MD, MBA 07/29/2012, 8:02 AM Family Medicine Resident, PGY-2   FMTS Attending  Admit Note Patient seen and examined by me; discussed with Dr Clinton Sawyer and I agree with plan above, with following additions:  Patient with history of prior pancreatitis, admitted with history of acute-onset abd pain (no nausea or vomiting) at 1pm on day of admission (May 15). No diarrhea, no hematochezia or hematemesis, no fevers but possibly subjective chills.  No prior abdominal surgeries.  Had 2 drinks EtOH on the morning of presentation, pain several hours later.  Agree with treatment for acute-on-chronic pancreatitis.  Given the acute nature of his onset, I am surprised by the findings of his abdominal CT.  Would consult General Surgery for their input into the utility of aspiration of pseudocysts vs debridement; decision on whether to give empiric abx or to withhold.  Given how well he appears clinically this morning, I believe we can defer the decision about abx until Gen Surgery gives their opinion.  Continue NPO and IV fluids, pain control.  CIWA and CSW for alcohol abuse. Paula Compton, MD

## 2012-07-29 NOTE — ED Notes (Signed)
[facility] called 

## 2012-07-29 NOTE — Consult Note (Signed)
Recurrent alcoholic pancreatitis now with early pseudocyst formation.  Bowel rest and IVF resuscitation.  I spoke at length with him and his family regarding the pathophysiology and treatment plan.  He expresses desire to quit drinking so I will ask CSW to see. We will F/U. Patient examined and I agree with the assessment and plan  Willie Gelinas, MD, MPH, FACS Pager: [phone]  07/29/2012 5:49 PM

## 2012-07-29 NOTE — Progress Notes (Signed)
Nutrition Brief Note  Patient identified on the Malnutrition Screening Tool (MST) Report  Body mass index is 23.37 kg/(m^2). Patient meets criteria for wnl based on current BMI.   Current diet order is NPO, due to pancreatitis. Labs and medications reviewed.   Lipase     Component Value Date/Time   LIPASE 1581* 07/29/2012 0610    Pt very drowsy at time of visit, unable to stay awake very long.  Wt stable at 160-165 lbs. Note h/o EtOH abuse. No nutrition interventions warranted at this time.  Will monitor for diet progression.  If nutrition support warranted, consider post-pyloric tube placement past the Ligament of Treitz with enteral infusion. If nutrition issues arise, please consult RD.  Loyce Dys, MS RD LDN Clinical Inpatient Dietitian Pager: [phone] Weekend/After hours pager: [phone]

## 2012-07-30 DIAGNOSIS — I10 Essential (primary) hypertension: Secondary | ICD-10-CM

## 2012-07-30 MED ORDER — HYDRALAZINE HCL 20 MG/ML IJ SOLN: 5.0000 mg | Freq: Four times a day (QID) | INTRAMUSCULAR | Status: DC | PRN

## 2012-07-30 NOTE — Progress Notes (Addendum)
FMTS Attending Daily Note:  Renold Don MD  [phone] pager  Family Practice pager:  [phone] I have seen and examined this patient and have reviewed their chart. I have discussed this patient with the resident. I agree with the resident's findings, assessment and care plan.  Additionally:  Pain somewhat improved.  Diet not yet changed -- adding sips today.  Labs tomorrow AM.  BP elevated today, likely secondary to pain.  He refused to take BP meds at home. Will watch while here, with hydralazine prn.  Tobey Grim, MD 07/30/2012 11:27 AM

## 2012-07-30 NOTE — ED Provider Notes (Signed)
Medical screening examination/treatment/procedure(s) were conducted as a shared visit with non-physician practitioner(s) and myself.  I personally evaluated the patient during the encounter  52yM with epigastric/LUQ pain. W/u consistent with pancreatitis. Abuses etoh. IVF. Pain control admit.   Raeford Razor, MD 07/30/12 [phone]

## 2012-07-30 NOTE — Progress Notes (Signed)
Family medicine Teaching Service [phone] Subjective: Patient reports improved LUQ pain. No emesis. Hasn't tried any sips yet. He does not desire any assistance with ETOH cessation at this time.  Objective: Vital signs in last 24 hours: Temp:  [98.6 F (37 C)-99.3 F (37.4 C)] 99.3 F (37.4 C) (05/17 0600) Pulse Rate:  [77-98] 91 (05/17 0600) Resp:  [14-18] 16 (05/17 0600) BP: (129-157)/(76-81) 157/81 mmHg (05/17 0600) SpO2:  [95 %-98 %] 97 % (05/17 0600) Weight:  [164 lb 12.8 oz (74.753 kg)-165 lb 6.4 oz (75.025 kg)] 164 lb 12.8 oz (74.753 kg) (05/17 0647) Weight change: 2 lb 9.6 oz (1.179 kg) Last BM Date: 07/29/12  Intake/Output from previous day: 05/16 0701 - 05/17 0700 In: -  Out: 275 [Urine:275] Intake/Output this shift:    General appearance: alert, cooperative, appears stated age and no distress Head: Normocephalic, without obvious abnormality, atraumatic Eyes: negative findings: conjunctivae and sclerae normal Resp: clear to auscultation bilaterally Cardio: regular rate and rhythm, S1, S2 normal, no murmur, click, rub or gallop GI: soft, nondistended. Epigastric and LUQ TTP. no rebound or guarding. Extremities: extremities normal, atraumatic, no cyanosis or edema Neurologic: Mental status: Alert, oriented, thought content appropriate Motor: grossly normal Coordination: normal  Lab Results:  Recent Labs  07/28/12 2048 07/29/12 0610  WBC 14.0* 11.4*  HGB 14.6 12.6*  HCT 42.6 37.4*  PLT 292 253   BMET  Recent Labs  07/28/12 2048 07/28/12 2235 07/29/12 0610  NA 137  --  136  K 5.0 4.1 3.8  CL 97  --  100  CO2 27  --  26  GLUCOSE 120*  --  87  BUN 9  --  7  CREATININE 0.79  --  0.77  CALCIUM 9.6  --  8.9    Studies/Results: Ct Abdomen Pelvis W Contrast  07/28/2012   *RADIOLOGY REPORT*  Clinical Data: Abdominal pain  CT ABDOMEN AND PELVIS WITH CONTRAST  Technique:  Multidetector CT imaging of the abdomen and pelvis was performed following the  standard protocol during bolus administration of intravenous contrast.  Contrast: OMNIPAQUE IOHEXOL 300 MG/ML  SOLN, 50mL OMNIPAQUE IOHEXOL 300 MG/ML  SOLN  Comparison: 09/03/2010  Findings: Mild peripheral opacity left lung base, favor atelectasis or scarring.  Coronary artery calcification.  Normal heart size.  Unremarkable liver, spleen, adrenal glands, biliary system.  There is peripancreatic fat stranding and ill-defined fluid.  Mild dilatation of the main pancreatic duct up to 4 mm within the body and tail to the level of multiple calcifications, some of which may be intraductal.  There are multiple cystic foci along the course of the body and head and neck of the pancreas.  The largest measures 2.3 cm within the body and measures water attenuation.  There are multiple punctate calcifications along the head, neck, and uncinate process of the pancreas.  Homogeneous parenchymal enhancement.  Symmetric renal enhancement.  No hydronephrosis or hydroureter.  No CT evidence for colitis.  Normal appendix.  Small bowel loops are of normal course and caliber.  There is thickening of the posterior margin of the gastric body, abutting the pancreatic process described above (series 2 image 25).  No free intraperitoneal air.  Reactive size lymph nodes.  Scattered atherosclerosis of the aorta and branch vessels.  No appreciable aneurysmal dilatation.  The portal vein and splenic vein are patent.  Partially decompressed bladder with nonspecific wall thickening. Free fluid dependently within the pelvis measures water attenuation.  Lumbar fusion hardware.  No acute osseous  finding.  IMPRESSION: Findings are in keeping with acute on chronic pancreatitis. Ectatic main pancreatic duct to the level of punctate calcifications, some of which may reflect intraductal stones.  There are multiple cystic foci along the course of the pancreas, largest of which measures 2.3 cm.  These are suspicious for pseudocysts.  No overt  necrosis.  Peripancreatic fat stranding abuts the posterior margin of the gastric body, with resultant wall thickening / inflammatory changes, in keeping with gastritis.   Original Report Authenticated By: Jearld Lesch, M.D.    Medications:  Scheduled: . folic acid  1 mg Intravenous Daily  . heparin  5,000 Units Subcutaneous Q8H  . LORazepam  0-4 mg Intravenous Q6H   Followed by  . [START ON 07/31/2012] LORazepam  0-4 mg Intravenous Q12H  . multivitamin with minerals  1 tablet Oral Daily  . pantoprazole (PROTONIX) IV  40 mg Intravenous QHS  . thiamine  100 mg Oral Daily   Or  . thiamine  100 mg Intravenous Daily   Continuous: . sodium chloride 150 mL/hr (07/30/12 0657)   ZOX:[id number], diphenhydrAMINE, LORazepam, LORazepam, morphine injection, nicotine, ondansetron (ZOFRAN) IV, ondansetron  Assessment/Plan: [name] is 52 y.o. male with known substance abuse/alcoholism admitted for acute on chronic pancreatitis.   Pancreatitis: Acute on chronic alcoholic pancreatitis, with lipase 1775 on admission, LDH 159 and a CT reporting pancreatitis with pseudocysts and calcifications.  -General surgery following, appreciate recommendations. No planned interventions indicated at this time. - NPO; IV hydration  - Morphine 4 mg Q3h for pain control; Benadryl for itching  - Lipids show TG 88. - Repeat Lipase in am. - Protonix IV   Substance abuse: Last drink prior to ED visit 5/16, UDS negative.  - CIWA protocol -1 mg given at 6 pm 5/16 - Nicotine patch 21 m-he refuses SW for assistive programs to cessation.  HTN: Patient reports he does not take his medication (Norvasc).  - will monitor BP and add medication as necessary   FEN/GI: add sips per surgery.  NS at 150 cc/hr. Prophylaxis: Hep sq  Disposition: Home pending clinical improvement.  Code Status: DNR   LOS: 2 days   Melva Faux PGY-3 07/30/2012, 8:25 AM

## 2012-07-30 NOTE — Progress Notes (Signed)
Subjective: Did not sleep well  Objective: Vital signs in last 24 hours: Temp:  [98.6 F (37 C)-99.3 F (37.4 C)] 99.3 F (37.4 C) (05/17 0600) Pulse Rate:  [77-98] 91 (05/17 0600) Resp:  [14-18] 16 (05/17 0600) BP: (129-157)/(76-81) 157/81 mmHg (05/17 0600) SpO2:  [95 %-98 %] 97 % (05/17 0600) Weight:  [74.753 kg (164 lb 12.8 oz)-75.025 kg (165 lb 6.4 oz)] 74.753 kg (164 lb 12.8 oz) (05/17 0647) Last BM Date: 07/29/12  Intake/Output from previous day: 05/16 0701 - 05/17 0700 In: -  Out: 275 [Urine:275] Intake/Output this shift:    General appearance: alert and cooperative Resp: clear to auscultation bilaterally Cardio: regular rate and rhythm GI: soft, less tender LUQ, no guarding Neurologic: Mental status: Alert, oriented, thought content appropriate  Lab Results:   Recent Labs  07/28/12 2048 07/29/12 0610  WBC 14.0* 11.4*  HGB 14.6 12.6*  HCT 42.6 37.4*  PLT 292 253   BMET  Recent Labs  07/28/12 2048 07/28/12 2235 07/29/12 0610  NA 137  --  136  K 5.0 4.1 3.8  CL 97  --  100  CO2 27  --  26  GLUCOSE 120*  --  87  BUN 9  --  7  CREATININE 0.79  --  0.77  CALCIUM 9.6  --  8.9   PT/INR  Recent Labs  07/29/12 0610  LABPROT 13.7  INR 1.06   ABG No results found for this basename: PHART, PCO2, PO2, HCO3,  in the last 72 hours  Studies/Results: Ct Abdomen Pelvis W Contrast  07/28/2012   *RADIOLOGY REPORT*  Clinical Data: Abdominal pain  CT ABDOMEN AND PELVIS WITH CONTRAST  Technique:  Multidetector CT imaging of the abdomen and pelvis was performed following the standard protocol during bolus administration of intravenous contrast.  Contrast: OMNIPAQUE IOHEXOL 300 MG/ML  SOLN, 50mL OMNIPAQUE IOHEXOL 300 MG/ML  SOLN  Comparison: 09/03/2010  Findings: Mild peripheral opacity left lung base, favor atelectasis or scarring.  Coronary artery calcification.  Normal heart size.  Unremarkable liver, spleen, adrenal glands, biliary system.  There is  peripancreatic fat stranding and ill-defined fluid.  Mild dilatation of the main pancreatic duct up to 4 mm within the body and tail to the level of multiple calcifications, some of which may be intraductal.  There are multiple cystic foci along the course of the body and head and neck of the pancreas.  The largest measures 2.3 cm within the body and measures water attenuation.  There are multiple punctate calcifications along the head, neck, and uncinate process of the pancreas.  Homogeneous parenchymal enhancement.  Symmetric renal enhancement.  No hydronephrosis or hydroureter.  No CT evidence for colitis.  Normal appendix.  Small bowel loops are of normal course and caliber.  There is thickening of the posterior margin of the gastric body, abutting the pancreatic process described above (series 2 image 25).  No free intraperitoneal air.  Reactive size lymph nodes.  Scattered atherosclerosis of the aorta and branch vessels.  No appreciable aneurysmal dilatation.  The portal vein and splenic vein are patent.  Partially decompressed bladder with nonspecific wall thickening. Free fluid dependently within the pelvis measures water attenuation.  Lumbar fusion hardware.  No acute osseous finding.  IMPRESSION: Findings are in keeping with acute on chronic pancreatitis. Ectatic main pancreatic duct to the level of punctate calcifications, some of which may reflect intraductal stones.  There are multiple cystic foci along the course of the pancreas, largest of which  measures 2.3 cm.  These are suspicious for pseudocysts.  No overt necrosis.  Peripancreatic fat stranding abuts the posterior margin of the gastric body, with resultant wall thickening / inflammatory changes, in keeping with gastritis.   Original Report Authenticated By: Jearld Lesch, M.D.    Anti-infectives: Anti-infectives   None      Assessment/Plan: .Alcoholic pancreatitis - would allow sips of clears today, chack lipase in AM, no acute  surgical need ETOH abuse - CIWA, CSW  LOS: 2 days    [name] E 07/30/2012

## 2012-07-31 LAB — COMPREHENSIVE METABOLIC PANEL
Albumin: 2.8 g/dL — ABNORMAL LOW (ref 3.5–5.2)
BUN: 4 mg/dL — ABNORMAL LOW (ref 6–23)
Chloride: 105 mEq/L (ref 96–112)
Creatinine, Ser: 0.79 mg/dL (ref 0.50–1.35)
Total Bilirubin: 0.4 mg/dL (ref 0.3–1.2)

## 2012-07-31 LAB — CBC
MCV: 86.5 fL (ref 78.0–100.0)
Platelets: 253 10*3/uL (ref 150–400)
RBC: 3.77 MIL/uL — ABNORMAL LOW (ref 4.22–5.81)
WBC: 9.9 10*3/uL (ref 4.0–10.5)

## 2012-07-31 LAB — LIPASE, BLOOD: Lipase: 1110 U/L — ABNORMAL HIGH (ref 11–59)

## 2012-07-31 MED ORDER — DEXTROSE-NACL 5-0.45 % IV SOLN
INTRAVENOUS | Status: DC
  Administered 2012-07-31 – 2012-08-03 (×8): via INTRAVENOUS
  Filled 2012-07-31: qty 1000

## 2012-07-31 MED ORDER — AMLODIPINE BESYLATE 5 MG PO TABS
5.0000 mg | ORAL_TABLET | Freq: Every day | ORAL | Status: DC
  Administered 2012-07-31 – 2012-08-01 (×2): 5 mg via ORAL
  Filled 2012-07-31 (×3): qty 1

## 2012-07-31 NOTE — Progress Notes (Signed)
FMTS Attending Daily Note:  Renold Don MD  [phone] pager  Family Practice pager:  [phone] I have seen and examined this patient and have reviewed their chart. I have discussed this patient with the resident. I agree with the resident's findings, assessment and care plan.  Additionally: Significant pancreatitis.  Would advance diet slowly when surgery deems okay.  Grateful for their input.    Tobey Grim, MD 07/31/2012 2:02 PM

## 2012-07-31 NOTE — Progress Notes (Signed)
Family medicine Teaching Service [phone] Subjective: Patient seen this morning with a friend from church. He denies abdominal pain, nausea, emesis. He denies agitation, anxiety and jitteriness. He is tolerating a clear liquid diet and request solids.   Objective: Vital signs in last 24 hours: Temp:  [98.3 F (36.8 C)-99 F (37.2 C)] 98.3 F (36.8 C) (05/18 9811) Pulse Rate:  [74-86] 80 (05/18 0608) Resp:  [16-18] 18 (05/18 0608) BP: (143-165)/(72-82) 164/82 mmHg (05/18 0608) SpO2:  [97 %-98 %] 97 % (05/18 0608) Weight:  [164 lb 3.9 oz (74.5 kg)] 164 lb 3.9 oz (74.5 kg) (05/18 0659) Weight change: -1 lb 2.5 oz (-0.525 kg) Last BM Date: 07/29/12  Intake/Output from previous day: 05/17 0701 - 05/18 0700 In: 7193.3 [P.O.:360; I.V.:6833.3] Out: 2225 [Urine:2225] Intake/Output this shift: Total I/O In: -  Out: 300 [Urine:300]  General appearance: alert, cooperative, appears stated age and no distress Head: Normocephalic, without obvious abnormality, atraumatic Eyes: negative findings: conjunctivae and sclerae normal Resp: clear to auscultation bilaterally Cardio: regular rate and rhythm, S1, S2 normal, no murmur, click, rub or gallop GI: soft, nondistended. none tender to palpation.  Extremities: extremities normal, atraumatic, no cyanosis or edema Neurologic: Mental status: Alert, oriented, thought content appropriate Motor: grossly normal Coordination: normal  Lab Results:  Recent Labs  07/29/12 0610 07/31/12 0510  WBC 11.4* 9.9  HGB 12.6* 11.0*  HCT 37.4* 32.6*  PLT 253 253   BMET  Recent Labs  07/29/12 0610 07/31/12 0510  NA 136 139  K 3.8 3.5  CL 100 105  CO2 26 21  GLUCOSE 87 72  BUN 7 4*  CREATININE 0.77 0.79  CALCIUM 8.9 8.6   Lipase     Component Value Date/Time   LIPASE 1110* 07/31/2012 0510     Studies/Results: No results found.  Medications:  Scheduled: . folic acid  1 mg Intravenous Daily  . heparin  5,000 Units Subcutaneous Q8H  .  LORazepam  0-4 mg Intravenous Q12H  . multivitamin with minerals  1 tablet Oral Daily  . pantoprazole (PROTONIX) IV  40 mg Intravenous QHS  . thiamine  100 mg Oral Daily   Or  . thiamine  100 mg Intravenous Daily   Continuous: . sodium chloride 150 mL/hr at 07/31/12 0600   BJY:[id number], diphenhydrAMINE, hydrALAZINE, LORazepam, LORazepam, morphine injection, nicotine, ondansetron (ZOFRAN) IV, ondansetron  Assessment/Plan: [name] is 52 y.o. male with known substance abuse/alcoholism admitted for acute on chronic pancreatitis.   Pancreatitis: Acute on chronic alcoholic pancreatitis, with lipase 1775 on admission, LDH 159 and a CT reporting pancreatitis with pseudocysts and calcifications.  A: improving. Pain free. Lipase trending down slowly.  P: General surgery following, appreciate recommendations. No planned interventions indicated at this time.  I spoke with Dr. Carolynne Edouard this am who recommends continuing to hold solids as a diet will stimulate the pancrease. Will continue to follow lipase.  - clear liquid diet,  IV hydration liberally at 150 ml/hr  - Morphine 4 mg Q3h for pain control; Benadryl for itching  - Lipids show TG 88. - Repeat Lipase in am. - Protonix IV   Substance abuse: Last drink prior to ED visit 5/16, UDS negative.  - CIWA protocol -no ativan required in the past 24 hrs.  - Nicotine patch 21 m-he refuses SW for assistive programs to cessation.  HTN: Patient reports he does not take his medication (Norvasc).  A: BP elevated this AM.  P: start norvasc 5 mg daily.   FEN/GI: add  sips per surgery.  NS at 150 cc/hr. Prophylaxis: Hep sq  Disposition: Home pending clinical improvement.  Code Status: DNR   LOS: 3 days   Ronald Vinsant PGY-3 07/31/2012, 9:14 AM

## 2012-08-01 LAB — COMPREHENSIVE METABOLIC PANEL
Alkaline Phosphatase: 79 U/L (ref 39–117)
BUN: 3 mg/dL — ABNORMAL LOW (ref 6–23)
Calcium: 9.3 mg/dL (ref 8.4–10.5)
Creatinine, Ser: 0.78 mg/dL (ref 0.50–1.35)
GFR calc Af Amer: >90 mL/min (ref >90)
Glucose, Bld: 92 mg/dL (ref 70–99)
Total Protein: 6.9 g/dL (ref 6.0–8.3)

## 2012-08-01 LAB — CBC
HCT: 35.3 % — ABNORMAL LOW (ref 39.0–52.0)
Hemoglobin: 11.8 g/dL — ABNORMAL LOW (ref 13.0–17.0)
MCH: 28.7 pg (ref 26.0–34.0)
MCHC: 33.4 g/dL (ref 30.0–36.0)
MCV: 85.9 fL (ref 78.0–100.0)
RDW: 13.2 % (ref 11.5–15.5)

## 2012-08-01 MED ORDER — POTASSIUM CHLORIDE 10 MEQ/100ML IV SOLN
10.0000 meq | INTRAVENOUS | Status: AC
  Administered 2012-08-01 (×4): 10 meq via INTRAVENOUS
  Filled 2012-08-01 (×2): qty 100
  Filled 2012-08-01: qty 200

## 2012-08-01 NOTE — Progress Notes (Signed)
Family medicine Teaching Service [phone] Subjective: Pt would like to eat today. Denies abdominal pain or nausea. Has tolerated clears.  Objective: Vital signs in last 24 hours: Temp:  [98 F (36.7 C)-98.8 F (37.1 C)] 98 F (36.7 C) (05/19 0538) Pulse Rate:  [78-84] 78 (05/19 0538) Resp:  [16-18] 18 (05/19 0538) BP: (148-158)/(73-82) 155/79 mmHg (05/19 0538) SpO2:  [97 %-100 %] 97 % (05/19 0538) Weight:  [159 lb 2.8 oz (72.2 kg)] 159 lb 2.8 oz (72.2 kg) (05/19 0538) Weight change: -5 lb 1.1 oz (-2.3 kg) Last BM Date: 07/31/12  Intake/Output from previous day: 05/18 0701 - 05/19 0700 In: 1350 [I.V.:1350] Out: 1500 [Urine:1500] Intake/Output this shift:   Exam: Gen: NAD, pleasant, cooperative, lying in bed HEENT: normocephalic, atraumatic Heart: RRR Lungs: some coarse crackles at bilateral bases but otherwise CTAB Abd: soft, nontender to palpation in all quadrants. No guarding. Neuro: grossly nonfocal, speech intact  Lab Results:  Recent Labs  07/31/12 0510 08/01/12 0530  WBC 9.9 9.8  HGB 11.0* 11.8*  HCT 32.6* 35.3*  PLT 253 285   BMET  Recent Labs  07/31/12 0510 08/01/12 0530  NA 139 141  K 3.5 3.4*  CL 105 104  CO2 21 23  GLUCOSE 72 92  BUN 4* 3*  CREATININE 0.79 0.78  CALCIUM 8.6 9.3   Lipase 1795 (previously 1110, 1581, 1775)  Studies/Results: No results found.  Medications:  Scheduled: . amLODipine  5 mg Oral Daily  . folic acid  1 mg Intravenous Daily  . heparin  5,000 Units Subcutaneous Q8H  . LORazepam  0-4 mg Intravenous Q12H  . multivitamin with minerals  1 tablet Oral Daily  . pantoprazole (PROTONIX) IV  40 mg Intravenous QHS  . thiamine  100 mg Oral Daily   Or  . thiamine  100 mg Intravenous Daily   Continuous: . dextrose 5 % and 0.45% NaCl 150 mL/hr at 07/31/12 1433   YNW:[id number], diphenhydrAMINE, hydrALAZINE, morphine injection, nicotine, ondansetron (ZOFRAN) IV, ondansetron  Assessment/Plan: [name] is 52 y.o.  male with known substance abuse/alcoholism admitted for acute on chronic pancreatitis.   Pancreatitis: Acute on chronic alcoholic pancreatitis, with lipase 1775 on admission, LDH 159 and a CT reporting pancreatitis with pseudocysts and calcifications.  A: improving. Pain free. Lipase had been trending down but has gone up today to 1795, above where it was upon admission. P: General surgery following, appreciate recommendations. No planned interventions indicated at this time.  - Will await surgery recommendations today about possibility of advancing diet versus going back to NPO in light of lipase spiking back up - clear liquid diet,  IV hydration liberally at 150 ml/hr. Monitor fluid status carefully - some crackles appreciated on exam today but pt denies shortness of breath - Morphine 4 mg Q3h for pain control; Benadryl for itching. Not requiring morphine in last 24 hours. - Lipids show TG 88. - Protonix IV   Substance abuse: Last drink prior to ED visit 5/16, UDS negative.  - CIWA protocol -no ativan required in the past 24 hrs.  - Nicotine patch 21 m-he refuses SW for assistive programs to cessation.  HTN: Patient reports he does not take his medication (Norvasc).  A: BP remains elevated despite starting norvasc 5mg  yesterday.  P: increase norvasc to 10mg  (pt's home dose, though he is noncompliant).  FEN/GI: on sips/clears.  NS at 150 cc/hr. K low at 3.4 - will replete with IV KCl x 4 runs Prophylaxis: Hep sq  Disposition: Home pending clinical improvement.  Code Status: DNR   LOS: 4 days   Levert Feinstein, MD [facility] Medicine PGY-1 Service Pager [phone]

## 2012-08-01 NOTE — Progress Notes (Signed)
FMTS Attending Daily Note:  Renold Don MD  [phone] pager  Family Practice pager:  [phone] I have seen and examined this patient and have reviewed their chart. I have discussed this patient with the resident. I agree with the resident's findings, assessment and care plan.  Additionally:  Tobey Grim, MD 08/01/2012 2:55 PM

## 2012-08-02 LAB — BASIC METABOLIC PANEL
CO2: 24 mEq/L (ref 19–32)
Calcium: 9.2 mg/dL (ref 8.4–10.5)
Chloride: 104 mEq/L (ref 96–112)
Potassium: 3.5 mEq/L (ref 3.5–5.1)
Sodium: 140 mEq/L (ref 135–145)

## 2012-08-02 MED ORDER — AMLODIPINE BESYLATE 10 MG PO TABS
10.0000 mg | ORAL_TABLET | Freq: Every day | ORAL | Status: DC
  Administered 2012-08-02 – 2012-08-04 (×3): 10 mg via ORAL
  Filled 2012-08-02 (×4): qty 1

## 2012-08-02 NOTE — Progress Notes (Signed)
Family Medicine Teaching Service Service Pager [phone]  Subjective: Wants to eat. Denies significant pain or nausea.  Objective: Vital signs in last 24 hours: Temp:  [98.1 F (36.7 C)-98.6 F (37 C)] 98.4 F (36.9 C) (05/20 0630) Pulse Rate:  [68-77] 68 (05/20 0630) Resp:  [16-18] 16 (05/20 0630) BP: (139-155)/(69-80) 139/75 mmHg (05/20 0630) SpO2:  [99 %-100 %] 100 % (05/20 0630) Weight change:  Last BM Date: 08/01/12  Intake/Output from previous day: 05/19 0701 - 05/20 0700 In: 6445 [P.O.:240; I.V.:5805; IV Piggyback:400] Out: 250 [Urine:250] Intake/Output this shift:   Exam: Gen: NAD, pleasant, cooperative, lying in bed HEENT: normocephalic, atraumatic Heart: RRR. No murmurs auscultated Lungs: CTAB, no rales Abd: soft, nontender to palpation in all quadrants. No guarding. + BS Ext: calves nontender to palpation Neuro: grossly nonfocal, speech intact  Lab Results:  Recent Labs  07/31/12 0510 08/01/12 0530  WBC 9.9 9.8  HGB 11.0* 11.8*  HCT 32.6* 35.3*  PLT 253 285   BMET  Recent Labs  07/31/12 0510 08/01/12 0530  NA 139 141  K 3.5 3.4*  CL 105 104  CO2 21 23  GLUCOSE 72 92  BUN 4* 3*  CREATININE 0.79 0.78  CALCIUM 8.6 9.3   Lipase trend 1775 > 1581 > 1110 > 1795  Studies/Results: No results found.  Medications:  Scheduled: . amLODipine  5 mg Oral Daily  . folic acid  1 mg Intravenous Daily  . heparin  5,000 Units Subcutaneous Q8H  . multivitamin with minerals  1 tablet Oral Daily  . pantoprazole (PROTONIX) IV  40 mg Intravenous QHS  . thiamine  100 mg Oral Daily   Or  . thiamine  100 mg Intravenous Daily   Continuous: . dextrose 5 % and 0.45% NaCl 150 mL/hr at 08/02/12 0424   QMV:[id number], diphenhydrAMINE, hydrALAZINE, morphine injection, nicotine, ondansetron (ZOFRAN) IV, ondansetron  Assessment/Plan: [name] is 52 y.o. male with known substance abuse/alcoholism admitted for acute on chronic pancreatitis.   Pancreatitis:  Acute on chronic alcoholic pancreatitis, with lipase 1775 on admission, LDH 159 and a CT reporting pancreatitis with pseudocysts and calcifications.  A: improving. Pain free. Lipase had been trending down but went up to 1795 on 5/19, above where it was upon admission. P: General surgery has signed off. Pt on ice chips overnight. Will slowly advance diet to full clears today. - IV hydration liberally at 150 ml/hr.  - Morphine 4 mg Q3h for pain control; Benadryl for itching. Not requiring morphine in last 24 hours. - Lipids show TG 88. - Protonix IV   Substance abuse: Last drink prior to ED visit 5/16, UDS negative.  - CIWA protocol -no ativan required in the past 24 hrs.  - Nicotine patch 21 m-he refuses SW for assistive programs to cessation.  HTN: Patient reports he does not take his medication (Norvasc).  A: BP remains elevated despite starting norvasc 5mg  yesterday.  P: increase norvasc to 10mg  (pt's home dose, though he is noncompliant). - will get first dose of amlod 10mg  today. Monitor BP's.  FEN/GI: on sips/clears.  D5 1/2NS at 150 cc/hr. Prophylaxis: Hep sq  Disposition: Home pending clinical improvement.  Code Status: DNR   LOS: 5 days   Levert Feinstein, MD [facility] Medicine PGY-1 Service Pager [phone]

## 2012-08-02 NOTE — Progress Notes (Signed)
FMTS Attending Daily Note:  Renold Don MD  [phone] pager  Family Practice pager:  [phone] I have seen and examined this patient and have reviewed their chart. I have discussed this patient with the resident. I agree with the resident's findings, assessment and care plan.  Additionally:  Patient pain free, even with palpation to epigastrum.  He is very hungry and wants to eat.  Will try gentle PO challenge today.  If tolerates, can increase to full liquids tonight.    Tobey Grim, MD 08/02/2012 3:33 PM

## 2012-08-02 NOTE — Clinical Social Work Note (Signed)
Clinical Social Worker received referral for current substance abuse. CSW reviewed chart and noticed that the patient is refusing to speak with SW regarding cessation programs. CSW will sign off for now, if new needs arise, please reconsult CSW.   Rozetta Nunnery MSW, [employer] [phone]

## 2012-08-03 DIAGNOSIS — F17200 Nicotine dependence, unspecified, uncomplicated: Secondary | ICD-10-CM

## 2012-08-03 LAB — BASIC METABOLIC PANEL
Chloride: 103 mEq/L (ref 96–112)
GFR calc non Af Amer: >90 mL/min (ref >90)
Glucose, Bld: 109 mg/dL — ABNORMAL HIGH (ref 70–99)
Potassium: 3.2 mEq/L — ABNORMAL LOW (ref 3.5–5.1)
Sodium: 141 mEq/L (ref 135–145)

## 2012-08-03 MED ORDER — PANTOPRAZOLE SODIUM 40 MG PO TBEC
40.0000 mg | DELAYED_RELEASE_TABLET | Freq: Every day | ORAL | Status: DC
  Administered 2012-08-03 – 2012-08-04 (×2): 40 mg via ORAL
  Filled 2012-08-03 (×2): qty 1

## 2012-08-03 MED ORDER — POTASSIUM CHLORIDE CRYS ER 20 MEQ PO TBCR
40.0000 meq | EXTENDED_RELEASE_TABLET | Freq: Once | ORAL | Status: AC
  Administered 2012-08-03: 40 meq via ORAL
  Filled 2012-08-03: qty 1

## 2012-08-03 NOTE — Progress Notes (Signed)
FMTS Attending Daily Note:  Renold Don MD  [phone] pager  Family Practice pager:  [phone] I have seen and examined this patient and have reviewed their chart. I have discussed this patient with the resident. I agree with the resident's findings, assessment and care plan.  Additionally:  Improving.  I saw him just after lunch, unclear whether he will tolerate transition to solid foods.  Pain minimal, will see if increases after lunch.  Will watch after dinner and can likely DC home in AM.    Tobey Grim, MD 08/03/2012 4:37 PM

## 2012-08-03 NOTE — Progress Notes (Signed)
Family Medicine Teaching Service Service Pager [phone]  Subjective: Wants to eat, asking when he can go home. Denies pain or nausea.  Objective: Vital signs in last 24 hours: Temp:  [98.1 F (36.7 C)-98.5 F (36.9 C)] 98.1 F (36.7 C) (05/21 0551) Pulse Rate:  [65-77] 77 (05/21 0551) Resp:  [16-18] 18 (05/21 0551) BP: (129-152)/(66-80) 129/66 mmHg (05/21 0551) SpO2:  [96 %-100 %] 96 % (05/21 0551) Weight:  [157 lb 13.6 oz (71.6 kg)] 157 lb 13.6 oz (71.6 kg) (05/21 0551) Weight change:  Last BM Date: 08/02/12  Intake/Output from previous day: 05/20 0701 - 05/21 0700 In: 3425 [I.V.:3425] Out: 1525 [Urine:1525] Intake/Output this shift:   Exam: Gen: NAD, lying in bed. Mildly irritable affect. HEENT: normocephalic, atraumatic Heart: RRR. No murmurs auscultated Lungs: CTAB, no rales heard. Normal respiratory effort. Abd: soft, nontender to palpation in all quadrants. No guarding. + BS Ext: calves nontender to palpation. Neuro: grossly nonfocal, speech intact  Lab Results:  Recent Labs  08/01/12 0530  WBC 9.8  HGB 11.8*  HCT 35.3*  PLT 285   BMET  Recent Labs  08/02/12 1413 08/03/12 0540  NA 140 141  K 3.5 3.2*  CL 104 103  CO2 24 23  GLUCOSE 106* 109*  BUN <3* <3*  CREATININE 0.81 0.82  CALCIUM 9.2 9.4   Lipase trend 1775 > 1581 > 1110 > 1795  Studies/Results: No results found.  Medications:  Scheduled: . amLODipine  10 mg Oral Daily  . folic acid  1 mg Intravenous Daily  . heparin  5,000 Units Subcutaneous Q8H  . multivitamin with minerals  1 tablet Oral Daily  . pantoprazole (PROTONIX) IV  40 mg Intravenous QHS  . potassium chloride  40 mEq Oral Once  . thiamine  100 mg Oral Daily   Or  . thiamine  100 mg Intravenous Daily   Continuous: . dextrose 5 % and 0.45% NaCl 150 mL/hr at 08/03/12 1478   GNF:[id number], diphenhydrAMINE, hydrALAZINE, morphine injection, nicotine, ondansetron (ZOFRAN) IV, ondansetron  Assessment/Plan: [name] is 52 y.o. male with known substance abuse/alcoholism admitted for acute on chronic pancreatitis.   Pancreatitis: Acute on chronic alcoholic pancreatitis, with lipase 1775 on admission, LDH 159 and a CT reporting pancreatitis with pseudocysts and calcifications.  A: improving. Pain free. Lipase had been trending down but went up to 1795 on 5/19, above where it was upon admission. P: General surgery has signed off.  - tolerating clear liquid diet so will change to heart healthy diet today, slowly advance - IV hydration liberally at 150 ml/hr.  - Morphine 4 mg Q3h for pain control; Benadryl for itching. Not requiring morphine in last 24 hours. - Lipids show TG 88. - Protonix IV   Substance abuse: Last drink prior to ED visit 5/16, UDS negative.  - CIWA protocol -no ativan required in the past 24 hrs.  - Nicotine patch 21 m-he refuses SW for assistive programs to cessation.  HTN: Patient reports he does not take his medication (Norvasc).  A: BP improved on home dose of norvasc (10mg )  P: continue amlodipine  FEN/GI: heart healthy diet.  D5 1/2NS at 150 cc/hr. Prophylaxis: Hep sq  Disposition: Home pending clinical improvement. Anticipate if he tolerates diet today, will d/c home early tomorrow morning. Code Status: DNR   LOS: 6 days   Levert Feinstein, MD [facility] Medicine PGY-1 Service Pager [phone]

## 2012-08-04 MED ORDER — POTASSIUM CHLORIDE CRYS ER 20 MEQ PO TBCR
40.0000 meq | EXTENDED_RELEASE_TABLET | Freq: Once | ORAL | Status: AC
  Administered 2012-08-04: 40 meq via ORAL
  Filled 2012-08-04: qty 2

## 2012-08-04 MED ORDER — FOLIC ACID 1 MG PO TABS
1.0000 mg | ORAL_TABLET | Freq: Every day | ORAL | Status: DC
  Administered 2012-08-04: 1 mg via ORAL
  Filled 2012-08-04: qty 1

## 2012-08-04 MED ORDER — AMLODIPINE BESYLATE 10 MG PO TABS: 10.0000 mg | ORAL_TABLET | Freq: Every day | ORAL | Status: DC

## 2012-08-04 NOTE — Progress Notes (Signed)
Pt discharged to home

## 2012-08-04 NOTE — Discharge Summary (Signed)
Family Medicine Teaching [facility] Discharge Summary  Patient name: [name] Medical record number: [id number] Date of birth: 30-May-1960 Age: 52 y.o. Gender: male Date of Admission: 07/28/2012  Date of Discharge: 08/04/12 Admitting Physician: Barbaraann Barthel, MD  Primary Care Provider: Demetria Pore, MD  Indication for Hospitalization: acute on chronic pancreatitis with pseudocysts  Discharge Diagnoses:  1. Acute on chronic pancreatitis 2. Alcohol abuse 3. Smoking 4. Hypertension   Brief Hospital Course:  [name] is a 52 y.o. male who was admitted to the hospital after presenting to the ER with abdominal pain, found to have a markedly elevated lipase.  # Pancreatitis: Acute on chronic alcoholic pancreatitis, with lipase 1775 on admission, LDH 159 and a CT reporting pancreatitis with pseudocysts and calcifications. Pt given IV morphine as needed for pain control. Lipase trended down then spiked back up to 1795 on 5/19. General surgery was consulted and did not feel that surgical intervention was warranted. Diet was slowly advanced and by day of discharge pt was tolerating intake of solid food. Lipids checked and showed triglycerides of 88.   # Substance abuse: Last drink prior to ED visit 5/16, UDS negative. Pt put on CIWA protocol and required only one dose of ativan during his stay here. Given nicotine patch for nicotine addiction. On day of discharge he stated that he was done with drinking alcohol and smoking cigarettes.  # HTN: Patient reported he does not take his medication (Norvasc). Had elevated BP's so home medicine was resumed, with improved control of BP. Pt instructed to take it upon discharge.  # Code Status: DNR per patient's request  Consultations: general surgery  Procedures: none  Significant Labs and Imaging:   BMET   08/02/12 1413  08/03/12 0540   NA  140  141   K  3.5  3.2*   CL  104  103   CO2  24  23   GLUCOSE  106*  109*   BUN   <3*  <3*   CREATININE  0.81  0.82   CALCIUM  9.2  9.4    Lipase trend 1775 > 1581 > 1110 > 1795  CT Abd/Pelvis with Contrast 5/15: -Findings are in keeping with acute on chronic pancreatitis.  -Ectatic main pancreatic duct to the level of punctate calcifications, some of which may reflect intraductal stones. There are multiple cystic foci along the course of the pancreas, largest of which measures 2.3 cm. These are suspicious for pseudocysts. No overt necrosis.  -Peripancreatic fat stranding abuts the posterior margin of the gastric body, with resultant wall thickening / inflammatory changes, in keeping with gastritis.  Discharge Medications:    Medication List    TAKE these medications       amLODipine 10 MG tablet  Commonly known as:  NORVASC  Take 1 tablet (10 mg total) by mouth daily.     pantoprazole 40 MG tablet  Commonly known as:  PROTONIX  Take 1 tablet (40 mg total) by mouth daily.        Issues for Follow Up:  - f/u on cessation of alcohol and tobacco - f/u on pain from pancreatitis - f/u BP control and adherence to amlodipine  Outstanding Results: none      Follow-up Information   Schedule an appointment as soon as possible for a visit with [facility], MD. (within one week to follow up)    Contact information:   [address] [place] [address] [phone]  Discharge Condition: stable  Discharge Instructions: Please refer to Patient Instructions section of EMR for full details.  Patient was counseled important signs and symptoms that should prompt return to medical care, changes in medications, dietary instructions, activity restrictions, and follow up appointments.    Willie Feinstein, MD Family Medicine PGY-1

## 2012-08-04 NOTE — Progress Notes (Signed)
I discussed with  Dr Pollie Meyer.  I agree with their plans documented in their progress note for today.

## 2012-08-04 NOTE — Progress Notes (Signed)
Family Medicine Teaching Service Service Pager [phone]  Subjective: Denies pain or nausea, has tolerated intake of solid foods. Wants to go home this morning. Pt reports that he has stopped drinking and smoking entirely and has no intention of resuming either activity when he returns home. Offered to have CSW come by and meet with him, but he says he has already quit.  Objective: Vital signs in last 24 hours: Temp:  [97.9 F (36.6 C)-98.3 F (36.8 C)] 97.9 F (36.6 C) (05/22 0631) Pulse Rate:  [73-79] 79 (05/22 0631) Resp:  [18] 18 (05/22 0631) BP: (114-140)/(67-74) 114/67 mmHg (05/22 0631) SpO2:  [97 %-100 %] 99 % (05/22 0631) Weight change:  Last BM Date: 08/03/12  Intake/Output from previous day: 05/21 0701 - 05/22 0700 In: 1100 [P.O.:1100] Out: -  Intake/Output this shift:   Exam: Gen: NAD. Sitting up ion couch with street clothes on, ready to go. HEENT: normocephalic, atraumatic Heart: RRR. No murmurs auscultated Lungs: CTAB, no rales heard. Normal respiratory effort. Abd: soft, nontender to palpation Neuro: grossly nonfocal, speech intact  Lab Results:  BMET  Recent Labs  08/02/12 1413 08/03/12 0540  NA 140 141  K 3.5 3.2*  CL 104 103  CO2 24 23  GLUCOSE 106* 109*  BUN <3* <3*  CREATININE 0.81 0.82  CALCIUM 9.2 9.4   Lipase trend 1775 > 1581 > 1110 > 1795  Studies/Results: No results found.  Medications:  Scheduled: . amLODipine  10 mg Oral Daily  . folic acid  1 mg Intravenous Daily  . heparin  5,000 Units Subcutaneous Q8H  . multivitamin with minerals  1 tablet Oral Daily  . pantoprazole  40 mg Oral Daily  . thiamine  100 mg Oral Daily   Or  . thiamine  100 mg Intravenous Daily   Continuous:   QIO:[id number], diphenhydrAMINE, hydrALAZINE, morphine injection, nicotine, ondansetron (ZOFRAN) IV, ondansetron  Assessment/Plan: LARANCE RATLEDGE is 52 y.o. male with known substance abuse/alcoholism admitted for acute on chronic pancreatitis.    Pancreatitis: Acute on chronic alcoholic pancreatitis, with lipase 1775 on admission, LDH 159 and a CT reporting pancreatitis with pseudocysts and calcifications.  A: improving. Pain free. Lipase had been trending down but went up to 1795 on 5/19, above where it was upon admission. P: General surgery has signed off.  - tolerating PO intake, not requiring IV pain medicine. Stable for discharge today. - Morphine 4 mg Q3h for pain control; Benadryl for itching. Not requiring morphine in last 24 hours. - Lipids show TG 88. - Protonix IV   Substance abuse: Last drink prior to ED visit 5/16, UDS negative.  - CIWA protocol -no ativan required in the past 24 hrs.  - Nicotine patch 21 m-he refuses SW for assistive programs to cessation. - Per patient, he has finished with drinking and smoking.  HTN: Patient reports he does not take his medication (Norvasc).  A: BP improved on home dose of norvasc (10mg )  P: continue amlodipine upon discharge  FEN/GI: heart healthy diet.  D/c IV. Prophylaxis: Hep sq  Disposition: Home today. Code Status: DNR   LOS: 7 days   Levert Feinstein, MD [facility] Medicine PGY-1 Service Pager [phone]

## 2012-08-08 NOTE — Discharge Summary (Signed)
I discussed with  Dr Pollie Meyer.  I agree with their plans documented in their discharge note for today.

## 2012-08-12 NOTE — ED Provider Notes (Deleted)
History     CSN: [id number]  Arrival date & time 07/28/12  2012   First MD Initiated Contact with Patient 07/28/12 2014      Chief Complaint  Patient presents with  . Abdominal Pain    (Consider location/radiation/quality/duration/timing/severity/associated sxs/prior treatment) HPI  Past Medical History  Diagnosis Date  . Hypertension   . Stroke   . Hyperlipidemia   . Depression   . Chronic pancreatitis   . Substance abuse   . Seizures   . Myocardial infarction     Past Surgical History  Procedure Laterality Date  . Back surgery    . Coronary stent placement      History reviewed. No pertinent family history.  History  Substance Use Topics  . Smoking status: Current Every Day Smoker -- 0.30 packs/day    Types: Cigarettes  . Smokeless tobacco: Never Used  . Alcohol Use: Yes     Comment: couple small glasses of liquor daily      Review of Systems  Allergies  Morphine and related  Home Medications   Current Outpatient Rx  Name  Route  Sig  Dispense  Refill  . EXPIRED: amLODipine (NORVASC) 10 MG tablet   Oral   Take 1 tablet (10 mg total) by mouth daily.   30 tablet   11   . amLODipine (NORVASC) 10 MG tablet   Oral   Take 1 tablet (10 mg total) by mouth daily.   30 tablet   0   . EXPIRED: pantoprazole (PROTONIX) 40 MG tablet   Oral   Take 1 tablet (40 mg total) by mouth daily.   30 tablet   1     BP 114/67  Pulse 79  Temp(Src) 97.9 F (36.6 C) (Oral)  Resp 18  Ht 5\' 10"  (1.778 m)  Wt 157 lb 13.6 oz (71.6 kg)  BMI 22.65 kg/m2  SpO2 99%  Physical Exam  ED Course  Procedures (including critical care time)  Labs Reviewed  CBC WITH DIFFERENTIAL - Abnormal; Notable for the following:    WBC 14.0 (*)    Neutro Abs 9.1 (*)    Monocytes Absolute 1.3 (*)    Eosinophils Relative 10 (*)    Eosinophils Absolute 1.4 (*)    All other components within normal limits  COMPREHENSIVE METABOLIC PANEL - Abnormal; Notable for the following:     Glucose, Bld 120 (*)    All other components within normal limits  LIPASE, BLOOD - Abnormal; Notable for the following:    Lipase 1775 (*)    All other components within normal limits  COMPREHENSIVE METABOLIC PANEL - Abnormal; Notable for the following:    Albumin 3.4 (*)    All other components within normal limits  CBC - Abnormal; Notable for the following:    WBC 11.4 (*)    Hemoglobin 12.6 (*)    HCT 37.4 (*)    All other components within normal limits  LIPASE, BLOOD - Abnormal; Notable for the following:    Lipase 1581 (*)    All other components within normal limits  LIPID PANEL - Abnormal; Notable for the following:    HDL 24 (*)    LDL Cholesterol 127 (*)    All other components within normal limits  COMPREHENSIVE METABOLIC PANEL - Abnormal; Notable for the following:    BUN 4 (*)    Albumin 2.8 (*)    All other components within normal limits  CBC - Abnormal; Notable for the following:  RBC 3.77 (*)    Hemoglobin 11.0 (*)    HCT 32.6 (*)    All other components within normal limits  LIPASE, BLOOD - Abnormal; Notable for the following:    Lipase 1110 (*)    All other components within normal limits  LIPASE, BLOOD - Abnormal; Notable for the following:    Lipase 1795 (*)    All other components within normal limits  CBC - Abnormal; Notable for the following:    RBC 4.11 (*)    Hemoglobin 11.8 (*)    HCT 35.3 (*)    All other components within normal limits  COMPREHENSIVE METABOLIC PANEL - Abnormal; Notable for the following:    Potassium 3.4 (*)    BUN 3 (*)    Albumin 3.2 (*)    All other components within normal limits  BASIC METABOLIC PANEL - Abnormal; Notable for the following:    Glucose, Bld 106 (*)    BUN <3 (*)    All other components within normal limits  BASIC METABOLIC PANEL - Abnormal; Notable for the following:    Potassium 3.2 (*)    Glucose, Bld 109 (*)    BUN <3 (*)    All other components within normal limits  ETHANOL  URINE RAPID  DRUG SCREEN (HOSP PERFORMED)  LACTATE DEHYDROGENASE  POTASSIUM  PROTIME-INR  LACTATE DEHYDROGENASE   No results found.   1. Pancreatitis   2. Alcohol abuse   3. Chronic alcoholic pancreatitis   4. Hypertension   5. Tobacco abuse       MDM         Arman Filter, NP 08/12/12 [phone]

## 2012-08-13 NOTE — ED Provider Notes (Signed)
Medical screening examination/treatment/procedure(s) were performed by non-physician practitioner and as supervising physician I was immediately available for consultation/collaboration.  Raeford Razor, MD 08/13/12 2139

## 2012-08-15 ENCOUNTER — Ambulatory Visit (INDEPENDENT_AMBULATORY_CARE_PROVIDER_SITE_OTHER): Payer: Medicare Other | Admitting: Family Medicine

## 2012-08-15 ENCOUNTER — Encounter: Payer: Self-pay | Admitting: Family Medicine

## 2012-08-15 VITALS — BP 140/79 | HR 87 | Temp 98.6°F | Ht 70.0 in | Wt 157.0 lb

## 2012-08-15 DIAGNOSIS — F1010 Alcohol abuse, uncomplicated: Secondary | ICD-10-CM

## 2012-08-15 DIAGNOSIS — K861 Other chronic pancreatitis: Secondary | ICD-10-CM

## 2012-08-15 DIAGNOSIS — Z720 Tobacco use: Secondary | ICD-10-CM

## 2012-08-15 DIAGNOSIS — I10 Essential (primary) hypertension: Secondary | ICD-10-CM

## 2012-08-15 DIAGNOSIS — F17200 Nicotine dependence, unspecified, uncomplicated: Secondary | ICD-10-CM

## 2012-08-15 DIAGNOSIS — K860 Alcohol-induced chronic pancreatitis: Secondary | ICD-10-CM

## 2012-08-15 NOTE — Patient Instructions (Addendum)
It was good to see you today- I'm glad you are feeling better.  Great job on not using any alcohol and on trying to quit smoking.  I strongly recommend the 1-800-QUIT-NOW line for all of my patients trying to quit. Please let me know if I can help in any way.  Let's plan to check back in at the end of the month before I leave-- I will give you an Rx for Cialis at that time.

## 2012-08-15 NOTE — Assessment & Plan Note (Signed)
Pt reports he is working on quitting-- only smoked 1 cig this weekend since d/c home 2 weeks ago.  Encouraged him to use quit line and other clinic resources.

## 2012-08-15 NOTE — Assessment & Plan Note (Signed)
Acceptable today on norvasc 10. F/u 3 weeks for recheck.  Pt would like Cialis but I encouraged him to make sure he is doing well with his BP prior to me Rx'ing this since he has h/o NSTEMI and is working on tobacco cessation.

## 2012-08-15 NOTE — Assessment & Plan Note (Signed)
Pt reports no EtOH since d/c home from the hospital 5/22.

## 2012-08-15 NOTE — Assessment & Plan Note (Signed)
Symptoms have resolved.

## 2012-08-15 NOTE — Progress Notes (Signed)
S: Pt comes in today for HFU.  He was admitted from 5/15-5/22 for AoC pancreatitis with pseudocysts.  He has known EtOH abuse and has continued to drink prior to admission.  Since d/c home, he reports NO alcohol use since d/c home.  He has an increased lipase, which worsened while in house, and general surgery was consulted-- no surgical intervention was needed.  He currently denies any pain, N/V/D.  No bloody emesis or melena.  Eating and drinking well, tolerating a normal diet.    He also has known HTN and had been noncompliant with his norvasc prior to admission.  Today, he reports that he has been taking his norvasc 10mg  at home daily.  No dizziness, CP, SOB, LEE, headaches.  He has not smoked since he got home from the hospital except 1 cigarette this past weekend.     ROS: Per HPI  History  Smoking status  . Current Every Day Smoker -- 0.30 packs/day  . Types: Cigarettes  Smokeless tobacco  . Never Used    O:  Filed Vitals:   08/15/12 0925  BP: 140/79  Pulse: 87  Temp: 98.6 F (37 C)    Gen: NAD CV: RRR, no murmur Pulm: CTA bilat, no wheezes or crackles Abd: soft, NT, ND Ext: Warm, no edema   A/P: 52 y.o. male p/w HFU -See problem list -f/u in 3 weeks

## 2012-09-03 NOTE — ED Provider Notes (Signed)
History     CSN: [id number]  Arrival date & time 07/28/12  2012   First MD Initiated Contact with Patient 07/28/12 2014      Chief Complaint  Patient presents with  . Abdominal Pain    (Consider location/radiation/quality/duration/timing/severity/associated sxs/prior treatment) HPI Comments: Pt stated that he cannot urinate.    Patient is a 52 y.o. male presenting with abdominal pain. The history is provided by the patient.  Abdominal Pain This is a new problem. Associated symptoms include abdominal pain.    Past Medical History  Diagnosis Date  . Hypertension   . Stroke   . Hyperlipidemia   . Depression   . Chronic pancreatitis   . Substance abuse   . Seizures   . Myocardial infarction     Past Surgical History  Procedure Laterality Date  . Back surgery    . Coronary stent placement      History reviewed. No pertinent family history.  History  Substance Use Topics  . Smoking status: Current Some Day Smoker -- 0.30 packs/day    Types: Cigarettes  . Smokeless tobacco: Never Used     Comment: working on quitting  . Alcohol Use: Yes     Comment: couple small glasses of liquor daily      Review of Systems  Gastrointestinal: Positive for abdominal pain.  Genitourinary: Positive for decreased urine volume.  All other systems reviewed and are negative.    Allergies  Morphine and related  Home Medications   Current Outpatient Rx  Name  Route  Sig  Dispense  Refill  . amLODipine (NORVASC) 10 MG tablet   Oral   Take 1 tablet (10 mg total) by mouth daily.   30 tablet   0     BP 114/67  Pulse 79  Temp(Src) 97.9 F (36.6 C) (Oral)  Resp 18  Ht 5\' 10"  (1.778 m)  Wt 157 lb 13.6 oz (71.6 kg)  BMI 22.65 kg/m2  SpO2 99%  Physical Exam  Vitals reviewed. Constitutional: He appears well-nourished.  HENT:  Head: Normocephalic.  Eyes: Pupils are equal, round, and reactive to light.  Cardiovascular: Regular rhythm.  Tachycardia present.    Pulmonary/Chest: Effort normal and breath sounds normal.  Abdominal: He exhibits distension.  Neurological: He is alert.  Skin: Skin is warm.    ED Course  Procedures (including critical care time)  Labs Reviewed  CBC WITH DIFFERENTIAL - Abnormal; Notable for the following:    WBC 14.0 (*)    Neutro Abs 9.1 (*)    Monocytes Absolute 1.3 (*)    Eosinophils Relative 10 (*)    Eosinophils Absolute 1.4 (*)    All other components within normal limits  COMPREHENSIVE METABOLIC PANEL - Abnormal; Notable for the following:    Glucose, Bld 120 (*)    All other components within normal limits  LIPASE, BLOOD - Abnormal; Notable for the following:    Lipase 1775 (*)    All other components within normal limits  COMPREHENSIVE METABOLIC PANEL - Abnormal; Notable for the following:    Albumin 3.4 (*)    All other components within normal limits  CBC - Abnormal; Notable for the following:    WBC 11.4 (*)    Hemoglobin 12.6 (*)    HCT 37.4 (*)    All other components within normal limits  LIPASE, BLOOD - Abnormal; Notable for the following:    Lipase 1581 (*)    All other components within normal limits  LIPID PANEL -  Abnormal; Notable for the following:    HDL 24 (*)    LDL Cholesterol 127 (*)    All other components within normal limits  COMPREHENSIVE METABOLIC PANEL - Abnormal; Notable for the following:    BUN 4 (*)    Albumin 2.8 (*)    All other components within normal limits  CBC - Abnormal; Notable for the following:    RBC 3.77 (*)    Hemoglobin 11.0 (*)    HCT 32.6 (*)    All other components within normal limits  LIPASE, BLOOD - Abnormal; Notable for the following:    Lipase 1110 (*)    All other components within normal limits  LIPASE, BLOOD - Abnormal; Notable for the following:    Lipase 1795 (*)    All other components within normal limits  CBC - Abnormal; Notable for the following:    RBC 4.11 (*)    Hemoglobin 11.8 (*)    HCT 35.3 (*)    All other  components within normal limits  COMPREHENSIVE METABOLIC PANEL - Abnormal; Notable for the following:    Potassium 3.4 (*)    BUN 3 (*)    Albumin 3.2 (*)    All other components within normal limits  BASIC METABOLIC PANEL - Abnormal; Notable for the following:    Glucose, Bld 106 (*)    BUN <3 (*)    All other components within normal limits  BASIC METABOLIC PANEL - Abnormal; Notable for the following:    Potassium 3.2 (*)    Glucose, Bld 109 (*)    BUN <3 (*)    All other components within normal limits  ETHANOL  URINE RAPID DRUG SCREEN (HOSP PERFORMED)  LACTATE DEHYDROGENASE  POTASSIUM  PROTIME-INR  LACTATE DEHYDROGENASE   No results found.   1. Pancreatitis   2. Alcohol abuse   3. Chronic alcoholic pancreatitis   4. Hypertension   5. Tobacco abuse       MDM  Will admit to [facility] under his PCP care         Arman Filter, NP 09/03/12 2010

## 2012-09-08 NOTE — ED Provider Notes (Signed)
Medical screening examination/treatment/procedure(s) were performed by non-physician practitioner and as supervising physician I was immediately available for consultation/collaboration.  Raeford Razor, MD 09/08/12 [phone]

## 2012-09-10 DIAGNOSIS — M5432 Sciatica, left side: Secondary | ICD-10-CM | POA: Insufficient documentation

## 2013-03-06 ENCOUNTER — Other Ambulatory Visit: Payer: Self-pay

## 2013-03-06 ENCOUNTER — Inpatient Hospital Stay (HOSPITAL_COMMUNITY)
Admission: EM | Admit: 2013-03-06 | Discharge: 2013-03-08 | DRG: 247 | Disposition: A | Discharge status: Home / Self Care | Payer: Medicare Other | Attending: Cardiology | Admitting: Cardiology

## 2013-03-06 ENCOUNTER — Encounter (HOSPITAL_COMMUNITY)
Admission: EM | Disposition: A | Discharge status: Home / Self Care | Payer: Medicare Other | Source: Home / Self Care | Attending: Cardiology

## 2013-03-06 ENCOUNTER — Encounter (HOSPITAL_COMMUNITY): Payer: Self-pay | Admitting: Emergency Medicine

## 2013-03-06 ENCOUNTER — Emergency Department (HOSPITAL_COMMUNITY): Payer: Medicare Other

## 2013-03-06 DIAGNOSIS — F329 Major depressive disorder, single episode, unspecified: Secondary | ICD-10-CM | POA: Diagnosis present

## 2013-03-06 DIAGNOSIS — F1010 Alcohol abuse, uncomplicated: Secondary | ICD-10-CM | POA: Diagnosis present

## 2013-03-06 DIAGNOSIS — M1990 Unspecified osteoarthritis, unspecified site: Secondary | ICD-10-CM | POA: Diagnosis present

## 2013-03-06 DIAGNOSIS — I10 Essential (primary) hypertension: Secondary | ICD-10-CM | POA: Diagnosis present

## 2013-03-06 DIAGNOSIS — F1910 Other psychoactive substance abuse, uncomplicated: Secondary | ICD-10-CM | POA: Diagnosis present

## 2013-03-06 DIAGNOSIS — Z8673 Personal history of transient ischemic attack (TIA), and cerebral infarction without residual deficits: Secondary | ICD-10-CM

## 2013-03-06 DIAGNOSIS — Z9861 Coronary angioplasty status: Secondary | ICD-10-CM

## 2013-03-06 DIAGNOSIS — I214 Non-ST elevation (NSTEMI) myocardial infarction: Principal | ICD-10-CM | POA: Diagnosis present

## 2013-03-06 DIAGNOSIS — E7800 Pure hypercholesterolemia, unspecified: Secondary | ICD-10-CM | POA: Diagnosis present

## 2013-03-06 DIAGNOSIS — F3289 Other specified depressive episodes: Secondary | ICD-10-CM | POA: Diagnosis present

## 2013-03-06 DIAGNOSIS — K861 Other chronic pancreatitis: Secondary | ICD-10-CM | POA: Diagnosis present

## 2013-03-06 DIAGNOSIS — F17200 Nicotine dependence, unspecified, uncomplicated: Secondary | ICD-10-CM | POA: Diagnosis present

## 2013-03-06 DIAGNOSIS — I2510 Atherosclerotic heart disease of native coronary artery without angina pectoris: Secondary | ICD-10-CM | POA: Diagnosis present

## 2013-03-06 DIAGNOSIS — E785 Hyperlipidemia, unspecified: Secondary | ICD-10-CM | POA: Diagnosis present

## 2013-03-06 DIAGNOSIS — I252 Old myocardial infarction: Secondary | ICD-10-CM

## 2013-03-06 HISTORY — PX: LEFT HEART CATHETERIZATION WITH CORONARY ANGIOGRAM: SHX5451

## 2013-03-06 HISTORY — DX: Atherosclerotic heart disease of native coronary artery without angina pectoris: I25.10

## 2013-03-06 LAB — BASIC METABOLIC PANEL
BUN: 9 mg/dL (ref 6–23)
CO2: 24 mEq/L (ref 19–32)
Calcium: 9.4 mg/dL (ref 8.4–10.5)
Creatinine, Ser: 0.68 mg/dL (ref 0.50–1.35)
GFR calc Af Amer: >90 mL/min (ref >90)
GFR calc non Af Amer: >90 mL/min (ref >90)
Glucose, Bld: 159 mg/dL — ABNORMAL HIGH (ref 70–99)
Potassium: 3.5 mEq/L (ref 3.5–5.1)

## 2013-03-06 LAB — CBC
HCT: 37.7 % — ABNORMAL LOW (ref 39.0–52.0)
Hemoglobin: 12.9 g/dL — ABNORMAL LOW (ref 13.0–17.0)
MCH: 28.9 pg (ref 26.0–34.0)
MCHC: 34.2 g/dL (ref 30.0–36.0)
MCV: 84.3 fL (ref 78.0–100.0)
Platelets: 254 10*3/uL (ref 150–400)
RDW: 13.9 % (ref 11.5–15.5)
WBC: 11 10*3/uL — ABNORMAL HIGH (ref 4.0–10.5)

## 2013-03-06 LAB — HEPARIN LEVEL (UNFRACTIONATED): Heparin Unfractionated: <0.1 IU/mL — ABNORMAL LOW (ref 0.30–0.70)

## 2013-03-06 LAB — COMPREHENSIVE METABOLIC PANEL
Alkaline Phosphatase: 69 U/L (ref 39–117)
BUN: 8 mg/dL (ref 6–23)
Chloride: 105 mEq/L (ref 96–112)
Creatinine, Ser: 0.65 mg/dL (ref 0.50–1.35)
GFR calc Af Amer: >90 mL/min (ref >90)
GFR calc non Af Amer: >90 mL/min (ref >90)
Glucose, Bld: 78 mg/dL (ref 70–99)
Potassium: 3.9 mEq/L (ref 3.5–5.1)
Sodium: 141 mEq/L (ref 135–145)
Total Bilirubin: 0.6 mg/dL (ref 0.3–1.2)
Total Protein: 6.7 g/dL (ref 6.0–8.3)

## 2013-03-06 LAB — CBC WITH DIFFERENTIAL/PLATELET
Eosinophils Absolute: 0.3 10*3/uL (ref 0.0–0.7)
HCT: 36.1 % — ABNORMAL LOW (ref 39.0–52.0)
Hemoglobin: 11.8 g/dL — ABNORMAL LOW (ref 13.0–17.0)
Lymphocytes Relative: 37 % (ref 12–46)
Lymphs Abs: 3.9 10*3/uL (ref 0.7–4.0)
MCH: 28.1 pg (ref 26.0–34.0)
MCHC: 32.7 g/dL (ref 30.0–36.0)
Monocytes Absolute: 0.7 10*3/uL (ref 0.1–1.0)
Monocytes Relative: 7 % (ref 3–12)
Neutro Abs: 5.6 10*3/uL (ref 1.7–7.7)
Neutrophils Relative %: 54 % (ref 43–77)
RBC: 4.2 MIL/uL — ABNORMAL LOW (ref 4.22–5.81)
WBC: 10.5 10*3/uL (ref 4.0–10.5)

## 2013-03-06 LAB — POCT ACTIVATED CLOTTING TIME: Activated Clotting Time: 138 seconds

## 2013-03-06 LAB — RAPID URINE DRUG SCREEN, HOSP PERFORMED
Amphetamines: NOT DETECTED
Benzodiazepines: NOT DETECTED
Opiates: NOT DETECTED

## 2013-03-06 LAB — CK TOTAL AND CKMB (NOT AT ARMC)
CK, MB: 108 ng/mL (ref 0.3–4.0)
Relative Index: 12.7 — ABNORMAL HIGH (ref 0.0–2.5)

## 2013-03-06 LAB — MRSA PCR SCREENING: MRSA by PCR: NEGATIVE

## 2013-03-06 LAB — PLATELET COUNT: Platelets: 198 10*3/uL (ref 150–400)

## 2013-03-06 LAB — PROTIME-INR: INR: 1.62 — ABNORMAL HIGH (ref 0.00–1.49)

## 2013-03-06 LAB — TROPONIN I
Troponin I: 10.59 ng/mL (ref <0.30)
Troponin I: >20 ng/mL (ref <0.30)

## 2013-03-06 LAB — HEMOGLOBIN A1C
Hgb A1c MFr Bld: 6.2 % — ABNORMAL HIGH (ref <5.7)
Mean Plasma Glucose: 131 mg/dL — ABNORMAL HIGH (ref <117)

## 2013-03-06 LAB — PRO B NATRIURETIC PEPTIDE: Pro B Natriuretic peptide (BNP): 137 pg/mL — ABNORMAL HIGH (ref 0–125)

## 2013-03-06 SURGERY — LEFT HEART CATHETERIZATION WITH CORONARY ANGIOGRAM
Anesthesia: LOCAL

## 2013-03-06 MED ORDER — HEPARIN BOLUS VIA INFUSION
4000.0000 [IU] | Freq: Once | INTRAVENOUS | Status: AC
  Administered 2013-03-06: 4000 [IU] via INTRAVENOUS
  Filled 2013-03-06: qty 4000

## 2013-03-06 MED ORDER — ACETAMINOPHEN 325 MG PO TABS: 650.0000 mg | ORAL_TABLET | ORAL | Status: DC | PRN

## 2013-03-06 MED ORDER — ASPIRIN 81 MG PO CHEW
324.0000 mg | CHEWABLE_TABLET | ORAL | Status: AC
  Administered 2013-03-06: 324 mg via ORAL
  Filled 2013-03-06: qty 4

## 2013-03-06 MED ORDER — MIDAZOLAM HCL 2 MG/2ML IJ SOLN
INTRAMUSCULAR | Status: AC
  Filled 2013-03-06: qty 2

## 2013-03-06 MED ORDER — FENTANYL CITRATE 0.05 MG/ML IJ SOLN: 25.0000 ug/kg | Freq: Once | INTRAMUSCULAR | Status: DC

## 2013-03-06 MED ORDER — TIROFIBAN HCL IV 5 MG/100ML
INTRAVENOUS | Status: AC
  Filled 2013-03-06: qty 100

## 2013-03-06 MED ORDER — NICOTINE 7 MG/24HR TD PT24
7.0000 mg | MEDICATED_PATCH | Freq: Every day | TRANSDERMAL | Status: DC
  Administered 2013-03-06 – 2013-03-08 (×3): 7 mg via TRANSDERMAL
  Filled 2013-03-06 (×4): qty 1

## 2013-03-06 MED ORDER — SODIUM CHLORIDE 0.9 % IJ SOLN: 3.0000 mL | INTRAMUSCULAR | Status: DC | PRN

## 2013-03-06 MED ORDER — NITROGLYCERIN 0.2 MG/ML ON CALL CATH LAB
INTRAVENOUS | Status: AC
  Filled 2013-03-06: qty 1

## 2013-03-06 MED ORDER — ASPIRIN 81 MG PO CHEW
81.0000 mg | CHEWABLE_TABLET | Freq: Every day | ORAL | Status: DC
  Administered 2013-03-07 – 2013-03-08 (×2): 81 mg via ORAL
  Filled 2013-03-06 (×2): qty 1

## 2013-03-06 MED ORDER — SODIUM CHLORIDE 0.9 % IV SOLN
0.2500 mg/kg/h | INTRAVENOUS | Status: AC
  Filled 2013-03-06: qty 250

## 2013-03-06 MED ORDER — TICAGRELOR 90 MG PO TABS
90.0000 mg | ORAL_TABLET | Freq: Two times a day (BID) | ORAL | Status: DC
  Administered 2013-03-06 – 2013-03-08 (×4): 90 mg via ORAL
  Filled 2013-03-06 (×6): qty 1

## 2013-03-06 MED ORDER — SODIUM CHLORIDE 0.9 % IV BOLUS (SEPSIS)
1000.0000 mL | Freq: Once | INTRAVENOUS | Status: AC
  Administered 2013-03-06: 1000 mL via INTRAVENOUS

## 2013-03-06 MED ORDER — PANTOPRAZOLE SODIUM 40 MG PO TBEC
40.0000 mg | DELAYED_RELEASE_TABLET | Freq: Every day | ORAL | Status: DC
  Administered 2013-03-06 – 2013-03-08 (×3): 40 mg via ORAL
  Filled 2013-03-06 (×3): qty 1

## 2013-03-06 MED ORDER — SODIUM CHLORIDE 0.9 % IV SOLN: 250.0000 mL | INTRAVENOUS | Status: DC | PRN

## 2013-03-06 MED ORDER — NITROGLYCERIN IN D5W 200-5 MCG/ML-% IV SOLN
10.0000 ug/min | INTRAVENOUS | Status: DC
  Administered 2013-03-06 (×2): 10 ug/min via INTRAVENOUS
  Filled 2013-03-06: qty 250

## 2013-03-06 MED ORDER — ALPRAZOLAM 0.5 MG PO TABS: 0.5000 mg | ORAL_TABLET | Freq: Three times a day (TID) | ORAL | Status: DC | PRN

## 2013-03-06 MED ORDER — SODIUM CHLORIDE 0.9 % IV SOLN
INTRAVENOUS | Status: DC
  Administered 2013-03-06: 150 mL/h via INTRAVENOUS

## 2013-03-06 MED ORDER — FENTANYL CITRATE 0.05 MG/ML IJ SOLN
25.0000 ug | Freq: Once | INTRAMUSCULAR | Status: AC
  Administered 2013-03-06: 50 ug via INTRAVENOUS
  Filled 2013-03-06: qty 2

## 2013-03-06 MED ORDER — HEPARIN (PORCINE) IN NACL 100-0.45 UNIT/ML-% IJ SOLN
900.0000 [IU]/h | INTRAMUSCULAR | Status: DC
  Filled 2013-03-06: qty 250

## 2013-03-06 MED ORDER — TICAGRELOR 90 MG PO TABS
180.0000 mg | ORAL_TABLET | Freq: Once | ORAL | Status: AC
  Administered 2013-03-06: 180 mg via ORAL
  Filled 2013-03-06: qty 2

## 2013-03-06 MED ORDER — FENTANYL CITRATE 0.05 MG/ML IJ SOLN: 12.5000 ug | Freq: Once | INTRAMUSCULAR | Status: DC

## 2013-03-06 MED ORDER — HYDROMORPHONE HCL PF 1 MG/ML IJ SOLN
1.0000 mg | Freq: Once | INTRAMUSCULAR | Status: AC
  Administered 2013-03-06: 1 mg via INTRAVENOUS
  Filled 2013-03-06: qty 1

## 2013-03-06 MED ORDER — BIVALIRUDIN 250 MG IV SOLR
INTRAVENOUS | Status: AC
  Filled 2013-03-06: qty 250

## 2013-03-06 MED ORDER — ASPIRIN 81 MG PO CHEW: 81.0000 mg | CHEWABLE_TABLET | ORAL | Status: DC

## 2013-03-06 MED ORDER — NITROGLYCERIN IN D5W 200-5 MCG/ML-% IV SOLN
5.0000 ug/min | INTRAVENOUS | Status: DC
  Administered 2013-03-06: 5 ug/min via INTRAVENOUS

## 2013-03-06 MED ORDER — NITROGLYCERIN 0.4 MG SL SUBL: 0.4000 mg | SUBLINGUAL_TABLET | SUBLINGUAL | Status: DC | PRN

## 2013-03-06 MED ORDER — SODIUM CHLORIDE 0.9 % IV SOLN
INTRAVENOUS | Status: AC
  Administered 2013-03-06: 10:00:00 via INTRAVENOUS

## 2013-03-06 MED ORDER — LIDOCAINE HCL (PF) 1 % IJ SOLN
INTRAMUSCULAR | Status: AC
  Filled 2013-03-06: qty 30

## 2013-03-06 MED ORDER — DIAZEPAM 5 MG PO TABS
5.0000 mg | ORAL_TABLET | ORAL | Status: AC
  Administered 2013-03-06: 5 mg via ORAL
  Filled 2013-03-06: qty 1

## 2013-03-06 MED ORDER — ASPIRIN 300 MG RE SUPP
300.0000 mg | RECTAL | Status: AC
  Filled 2013-03-06: qty 1

## 2013-03-06 MED ORDER — FENTANYL CITRATE 0.05 MG/ML IJ SOLN
INTRAMUSCULAR | Status: AC
  Filled 2013-03-06: qty 2

## 2013-03-06 MED ORDER — METOPROLOL TARTRATE 1 MG/ML IV SOLN
5.0000 mg | Freq: Once | INTRAVENOUS | Status: AC
  Administered 2013-03-06: 5 mg via INTRAVENOUS
  Filled 2013-03-06: qty 5

## 2013-03-06 MED ORDER — SODIUM CHLORIDE 0.9 % IJ SOLN: 3.0000 mL | Freq: Two times a day (BID) | INTRAMUSCULAR | Status: DC

## 2013-03-06 MED ORDER — ONDANSETRON HCL 4 MG/2ML IJ SOLN: 4.0000 mg | Freq: Four times a day (QID) | INTRAMUSCULAR | Status: DC | PRN

## 2013-03-06 MED ORDER — ASPIRIN EC 81 MG PO TBEC
81.0000 mg | DELAYED_RELEASE_TABLET | Freq: Every day | ORAL | Status: DC
  Filled 2013-03-06 (×2): qty 1

## 2013-03-06 MED ORDER — TIROFIBAN HCL IV 5 MG/100ML
0.1500 ug/kg/min | INTRAVENOUS | Status: AC
  Administered 2013-03-06: 10:00:00 0.15 ug/kg/min via INTRAVENOUS
  Filled 2013-03-06 (×3): qty 100

## 2013-03-06 MED ORDER — METOPROLOL TARTRATE 12.5 MG HALF TABLET
12.5000 mg | ORAL_TABLET | Freq: Two times a day (BID) | ORAL | Status: DC
  Administered 2013-03-06 – 2013-03-08 (×5): 12.5 mg via ORAL
  Filled 2013-03-06 (×8): qty 1

## 2013-03-06 MED ORDER — HEPARIN (PORCINE) IN NACL 2-0.9 UNIT/ML-% IJ SOLN
INTRAMUSCULAR | Status: AC
  Filled 2013-03-06: qty 1000

## 2013-03-06 MED ORDER — HEPARIN (PORCINE) IN NACL 100-0.45 UNIT/ML-% IJ SOLN
900.0000 [IU]/h | INTRAMUSCULAR | Status: DC
  Administered 2013-03-06: 900 [IU]/h via INTRAVENOUS
  Filled 2013-03-06: qty 250

## 2013-03-06 MED ORDER — ATORVASTATIN CALCIUM 80 MG PO TABS
80.0000 mg | ORAL_TABLET | Freq: Every day | ORAL | Status: DC
  Administered 2013-03-06 – 2013-03-07 (×2): 80 mg via ORAL
  Filled 2013-03-06 (×4): qty 1

## 2013-03-06 NOTE — ED Notes (Signed)
Saline Bolus initiated for hypotension

## 2013-03-06 NOTE — Progress Notes (Signed)
ANTICOAGULATION CONSULT NOTE - Initial Consult  Pharmacy Consult for heparin Indication: NSTEMI  Allergies  Allergen Reactions  . Morphine And Related Itching    Patient Measurements: Weight: ~70kg  Vital Signs: Temp: 98.3 F (36.8 C) (12/22 0156) Temp src: Oral (12/22 0156) BP: 106/68 mmHg (12/22 0515) Pulse Rate: 71 (12/22 0515)  Labs:  Recent Labs  03/06/13 0155 03/06/13 0308  HGB 12.9*  --   HCT 37.7*  --   PLT 254  --   CREATININE 0.68  --   CKTOTAL  --  850*  CKMB  --  108.0*  TROPONINI  --  10.59*    Medical History: Past Medical History  Diagnosis Date  . Hypertension   . Stroke   . Hyperlipidemia   . Depression   . Chronic pancreatitis   . Substance abuse   . Seizures   . Myocardial infarction   . Coronary artery disease     Medications:  No prescriptions prior to admission   Scheduled:  . aspirin  324 mg Oral NOW   Or  . aspirin  300 mg Rectal NOW  . [START ON 03/07/2013] aspirin  81 mg Oral Pre-Cath  . [START ON 03/07/2013] aspirin EC  81 mg Oral Daily  . atorvastatin  80 mg Oral q1800  . diazepam  5 mg Oral On Call  . metoprolol tartrate  12.5 mg Oral BID  . pantoprazole  40 mg Oral Q0600  . sodium chloride  3 mL Intravenous Q12H  . Ticagrelor  180 mg Oral Once   Infusions:  . sodium chloride    . heparin 900 Units/hr (03/06/13 0350)  . nitroGLYCERIN      Assessment: 52yo male c/o mid CP x2d associated w/ SOB, dry cough, and emesis, woke up this am at 0200 w/ 10/10 CP, states he stopped all meds 66mo ago, of note pt had PTCA stending in July 2013, to start heparin.  Goal of Therapy:  Heparin level 0.3-0.7 units/ml Monitor platelets by anticoagulation protocol: Yes   Plan:  Heparin 4000 unit bolus and gtt at 900 units/hr was started by EDMD; will continue for now and monitor heparin levels and CBC.  Vernard Gambles, PharmD, BCPS  03/06/2013,5:57 AM

## 2013-03-06 NOTE — Progress Notes (Signed)
Patient not able to eat at this time secondary to being npo until sheath pull.  He was brought a breakfast tray.  Patient does not want to be charged for this meal.  I called dietary, then the billing office.  I was advised to put this note in the chart and for patient to call billing office after discharge to have this taken off their bill.

## 2013-03-06 NOTE — ED Notes (Signed)
Dr Lavella Lemons informed by phone of pt Troponin results 3.71

## 2013-03-06 NOTE — ED Provider Notes (Addendum)
CSN: [id number]     Arrival date & time 03/06/13  0148 History   First MD Initiated Contact with Patient 03/06/13 0252     Chief Complaint  Patient presents with  . Chest Pain   (Consider location/radiation/quality/duration/timing/severity/associated sxs/prior Treatment) HPI Patient is a pleasant man in his 53s who has a history of CAD and MI. He presents with complaints of chest pain which began around 1600h yesterday. Pain began while he was at rest watching TV and was associated with diaphoresis and nausea. It was severe. Patient took a couple of 325mg  ASA tablets, his pain improved but did not subside. He decided to ignore it and go to sleep.   However, he awoke from sleep with pain and thus decided to be evaluated in the ED.  He notes having a similar episode of CP 2 days ago. At that time, the pain began after he ate a cheeseburger. It was followed by nausea and vomiting. His pain resolved without intervention.   Pain is currently 6/10. It is aching and non-radiating. Patient has not had any diaphoresis, nausea or vomiting in the ED. No SOB. Although, he endorses pain with inspiration.   Past Medical History  Diagnosis Date  . Hypertension   . Stroke   . Hyperlipidemia   . Depression   . Chronic pancreatitis   . Substance abuse   . Seizures   . Myocardial infarction   . Coronary artery disease    Past Surgical History  Procedure Laterality Date  . Back surgery    . Coronary stent placement     No family history on file. History  Substance Use Topics  . Smoking status: Current Some Day Smoker -- 0.30 packs/day    Types: Cigarettes  . Smokeless tobacco: Never Used     Comment: working on quitting  . Alcohol Use: Yes     Comment: couple small glasses of liquor daily    Review of Systems Ten point review of symptoms performed and is negative with the exception of symptoms noted above.   Allergies  Morphine and related  Home Medications  No current outpatient  prescriptions on file. BP 132/69  Pulse 88  Temp(Src) 98.3 F (36.8 C) (Oral)  Resp 18  SpO2 100% Physical Exam Gen: well developed and well nourished appearing Head: NCAT Eyes: PERL, EOMI Nose: no epistaixis or rhinorrhea Mouth/throat: mucosa is moist and pink Neck: supple, no stridor Lungs: CTA B, no wheezing, rhonchi or rales CV: regular rate and rythm, good distal pulses.  Abd: soft, notender, nondistended Back: no ttp, no cva ttp Skin: warm and dry Ext: no edema, normal to inspection Neuro: CN ii-xii grossly intact, no focal deficits Psyche; normal affect,  calm and cooperative.  ED Course  Procedures (including critical care time) Labs Review  Results for orders placed during the hospital encounter of 03/06/13 (from the past 24 hour(s))  CBC     Status: Abnormal   Collection Time    03/06/13  1:55 AM      Result Value Range   WBC 11.0 (*) 4.0 - 10.5 K/uL   RBC 4.47  4.22 - 5.81 MIL/uL   Hemoglobin 12.9 (*) 13.0 - 17.0 g/dL   HCT 40.9 (*) 81.1 - 91.4 %   MCV 84.3  78.0 - 100.0 fL   MCH 28.9  26.0 - 34.0 pg   MCHC 34.2  30.0 - 36.0 g/dL   RDW 78.2  95.6 - 21.3 %   Platelets 254  150 -  400 K/uL  BASIC METABOLIC PANEL     Status: Abnormal   Collection Time    03/06/13  1:55 AM      Result Value Range   Sodium 137  135 - 145 mEq/L   Potassium 3.5  3.5 - 5.1 mEq/L   Chloride 100  96 - 112 mEq/L   CO2 24  19 - 32 mEq/L   Glucose, Bld 159 (*) 70 - 99 mg/dL   BUN 9  6 - 23 mg/dL   Creatinine, Ser 4.54  0.50 - 1.35 mg/dL   Calcium 9.4  8.4 - 09.8 mg/dL   GFR calc non Af Amer >90  >90 mL/min   GFR calc Af Amer >90  >90 mL/min  PRO B NATRIURETIC PEPTIDE     Status: Abnormal   Collection Time    03/06/13  1:55 AM      Result Value Range   Pro B Natriuretic peptide (BNP) 137.0 (*) 0 - 125 pg/mL  POCT I-STAT TROPONIN I     Status: Abnormal   Collection Time    03/06/13  2:41 AM      Result Value Range   Troponin i, poc 3.71 (*) 0.00 - 0.08 ng/mL   Comment  NOTIFIED PHYSICIAN     Comment 3              Imaging Review Dg Chest 2 View  03/06/2013   CLINICAL DATA:  Mid chest pain.  EXAM: CHEST  2 VIEW  COMPARISON:  Chest radiograph performed 10/01/2011  FINDINGS: The lungs are well-aerated and clear. There is no evidence of focal opacification, pleural effusion or pneumothorax.  The heart is normal in size; the mediastinal contour is within normal limits. No acute osseous abnormalities are seen.  IMPRESSION: No acute cardiopulmonary process seen.   Electronically Signed   By: Roanna Raider M.D.   On: 03/06/2013 02:35   EKG: NSR, rate 99 bpm, normal axis, normal qrs, no STEMI, T wave depression in the lateral leads is an interval change from July 2014.   CRITICAL CARE Performed by: Brandt Loosen   Total critical care time: 23m  Critical care time was exclusive of separately billable procedures and treating other patients.  Critical care was necessary to treat or prevent imminent or life-threatening deterioration.  Critical care was time spent personally by me on the following activities: development of treatment plan with patient and/or surrogate as well as nursing, discussions with consultants, evaluation of patient's response to treatment, examination of patient, obtaining history from patient or surrogate, ordering and performing treatments and interventions, ordering and review of laboratory studies, ordering and review of radiographic studies, pulse oximetry and re-evaluation of patient's condition.   MDM  Patient with NSTEMI and iSTAT troponin of 3.7. I was alerted of this finding at 0301 by lab. He has had ASA. We will tx with NTG gtt, MS, heparin, BB.  Patient is not currently taking any medications.  His VS are wnl with BP of 108/66. We will consult cardiology for admission.   Dr. Sharyn Lull paged to request admission.     Brandt Loosen, MD 03/06/13 1191  Brandt Loosen, MD 03/06/13 [phone]

## 2013-03-06 NOTE — Interval H&P Note (Signed)
Cath Lab Visit (complete for each Cath Lab visit)  Clinical Evaluation Leading to the Procedure:   ACS: yes  Non-ACS:    Anginal Classification: CCS IV  Anti-ischemic medical therapy: No Therapy  Non-Invasive Test Results: No non-invasive testing performed  Prior CABG: No previous CABG      History and Physical Interval Note:  03/06/2013 7:29 AM  [name]  has presented today for surgery, with the diagnosis of cp  The various methods of treatment have been discussed with the patient and family. After consideration of risks, benefits and other options for treatment, the patient has consented to  Procedure(s): LEFT HEART CATHETERIZATION WITH CORONARY ANGIOGRAM (N/A) as a surgical intervention .  The patient's history has been reviewed, patient examined, no change in status, stable for surgery.  I have reviewed the patient's chart and labs.  Questions were answered to the patient's satisfaction.     Robynn Pane

## 2013-03-06 NOTE — Progress Notes (Signed)
Site area: right groin  Site Prior to Removal:  Level 0  Pressure Applied For 20 MINUTES    Minutes Beginning at 1450  Manual:   yes  Patient Status During Pull:  [id number]  Post Pull Groin Site:  Level 0  Post Pull Instructions Given:  yes  Post Pull Pulses Present:  yes  Dressing Applied:  yes  Comments: Tolerated procedure well

## 2013-03-06 NOTE — H&P (Signed)
[name] is an 52 y.o. male.   Chief Complaint: Recurrent chest pain HPI: Patient is 52 year old male with past medical history significant for coronary artery disease history of non-Q-wave myocardial infarction in July of 2013 status post PTCA stenting to RCA in July 2013, hypertension, history of CVA, hypercholesteremia, history of alcoholic pancreatitis, history of gastritis, history of focal abuse, tobacco abuse, and degenerative joint disease, stroke to the ER complaining of recurrent retrosternal chest pain associated with nausea vomiting and diaphoresis off and on since last 2 days did not seek medical attention earlier this morning woke up around 2 AM with chest pain grade 10 over 10 so decided to come to the ER. Denies any shortness of breath denies any palpitation lightheadedness or syncope. States he stopped all his medications approximately 6 months ago. Patient presently denies any chest pain. EKG done in the ER showed normal sinus rhythm with small Q waves in inferior lead with minimal ST elevation in lead 3 aVF with no reciprocal changes with baseline artifacts and nonspecific ST-T wave changes.  Past Medical History  Diagnosis Date  . Hypertension   . Stroke   . Hyperlipidemia   . Depression   . Chronic pancreatitis   . Substance abuse   . Seizures   . Myocardial infarction   . Coronary artery disease     Past Surgical History  Procedure Laterality Date  . Back surgery    . Coronary stent placement      No family history on file. Social History:  reports that he has been smoking Cigarettes.  He has been smoking about 0.30 packs per day. He has never used smokeless tobacco. He reports that he drinks alcohol. He reports that he does not use illicit drugs.  Allergies:  Allergies  Allergen Reactions  . Morphine And Related Itching     (Not in a hospital admission)  Results for orders placed during the hospital encounter of 03/06/13 (from the past 48 hour(s))   CBC     Status: Abnormal   Collection Time    03/06/13  1:55 AM      Result Value Range   WBC 11.0 (*) 4.0 - 10.5 K/uL   RBC 4.47  4.22 - 5.81 MIL/uL   Hemoglobin 12.9 (*) 13.0 - 17.0 g/dL   HCT 16.1 (*) 09.6 - 04.5 %   MCV 84.3  78.0 - 100.0 fL   MCH 28.9  26.0 - 34.0 pg   MCHC 34.2  30.0 - 36.0 g/dL   RDW 40.9  81.1 - 91.4 %   Platelets 254  150 - 400 K/uL  BASIC METABOLIC PANEL     Status: Abnormal   Collection Time    03/06/13  1:55 AM      Result Value Range   Sodium 137  135 - 145 mEq/L   Potassium 3.5  3.5 - 5.1 mEq/L   Chloride 100  96 - 112 mEq/L   CO2 24  19 - 32 mEq/L   Glucose, Bld 159 (*) 70 - 99 mg/dL   BUN 9  6 - 23 mg/dL   Creatinine, Ser 7.82  0.50 - 1.35 mg/dL   Calcium 9.4  8.4 - 95.6 mg/dL   GFR calc non Af Amer >90  >90 mL/min   GFR calc Af Amer >90  >90 mL/min   Comment: (NOTE)     The eGFR has been calculated using the CKD EPI equation.     This calculation has not been  validated in all clinical situations.     eGFR's persistently <90 mL/min signify possible Chronic Kidney     Disease.  PRO B NATRIURETIC PEPTIDE     Status: Abnormal   Collection Time    03/06/13  1:55 AM      Result Value Range   Pro B Natriuretic peptide (BNP) 137.0 (*) 0 - 125 pg/mL  POCT I-STAT TROPONIN I     Status: Abnormal   Collection Time    03/06/13  2:41 AM      Result Value Range   Troponin i, poc 3.71 (*) 0.00 - 0.08 ng/mL   Comment NOTIFIED PHYSICIAN     Comment 3            Comment: Due to the release kinetics of cTnI,     a negative result within the first hours     of the onset of symptoms does not rule out     myocardial infarction with certainty.     If myocardial infarction is still suspected,     repeat the test at appropriate intervals.   Dg Chest 2 View  03/06/2013   CLINICAL DATA:  Mid chest pain.  EXAM: CHEST  2 VIEW  COMPARISON:  Chest radiograph performed 10/01/2011  FINDINGS: The lungs are well-aerated and clear. There is no evidence of focal  opacification, pleural effusion or pneumothorax.  The heart is normal in size; the mediastinal contour is within normal limits. No acute osseous abnormalities are seen.  IMPRESSION: No acute cardiopulmonary process seen.   Electronically Signed   By: Roanna Raider M.D.   On: 03/06/2013 02:35    Review of Systems  Constitutional: Negative for fever, chills and weight loss.  Eyes: Negative for blurred vision, double vision and photophobia.  Cardiovascular: Positive for chest pain. Negative for palpitations, orthopnea, claudication and leg swelling.  Gastrointestinal: Positive for nausea and vomiting. Negative for abdominal pain, diarrhea and constipation.  Genitourinary: Negative for dysuria.  Musculoskeletal: Negative for myalgias.  Neurological: Negative for dizziness and headaches.    Blood pressure 80/43, pulse 69, temperature 98.3 F (36.8 C), temperature source Oral, resp. rate 14, SpO2 97.00%. Physical Exam  Constitutional: He is oriented to person, place, and time.  HENT:  Head: Normocephalic and atraumatic.  Eyes: Conjunctivae are normal. Left eye exhibits no discharge.  Neck: Neck supple. No JVD present. No tracheal deviation present. No thyromegaly present.  Cardiovascular: Normal rate and regular rhythm.   Murmur (Soft systolic murmur and S4 gallop noted) heard. Respiratory: Breath sounds normal. No respiratory distress. He has no wheezes. He has no rales.  GI: Soft. Bowel sounds are normal. He exhibits no distension. There is no tenderness. There is no rebound and no guarding.  Musculoskeletal: He exhibits no edema and no tenderness.  Neurological: He is alert and oriented to person, place, and time.     Assessment/Plan Acute non-ST elevation MI Coronary artery disease history of non-Q-wave MI in the past status post PCI to RCA Hypertension Hypercholesteremia History of CVA History of chronic pancreatitis And alcoholic gastritis History of EtOH abuse Tobacco  abuse Degenerative joint disease Plan As per orders Discussed with patient regarding left eye possible PTCA stenting its risk and benefits i.e. death MI stroke need for emergency CABG risk of restenosis local vascular complications etc. and consented for PCI  [facility] N 03/06/2013, 4:21 AM

## 2013-03-06 NOTE — Progress Notes (Addendum)
ANTICOAGULATION CONSULT NOTE - Initial Consult  Pharmacy Consult for aggrastat Indication: post PTCA stenting   Allergies  Allergen Reactions  . Morphine And Related Itching    Patient Measurements: Height: 5\' 11"  (180.3 cm) Weight: 144 lb 6.4 oz (65.5 kg) IBW/kg (Calculated) : 75.3 Heparin Dosing Weight:   Vital Signs: Temp: 97.9 F (36.6 C) (12/22 0615) Temp src: Oral (12/22 0615) BP: 129/49 mmHg (12/22 0700) Pulse Rate: 75 (12/22 0731)  Labs:  Recent Labs  03/06/13 0155 03/06/13 0308  HGB 12.9*  --   HCT 37.7*  --   PLT 254  --   CREATININE 0.68  --   CKTOTAL  --  850*  CKMB  --  108.0*  TROPONINI  --  10.59*    Estimated Creatinine Clearance: 102.3 ml/min (by C-G formula based on Cr of 0.68).   Medical History: Past Medical History  Diagnosis Date  . Hypertension   . Stroke   . Hyperlipidemia   . Depression   . Chronic pancreatitis   . Substance abuse   . Seizures   . Myocardial infarction   . Coronary artery disease     Medications:  Scheduled:  . [START ON 03/07/2013] aspirin EC  81 mg Oral Daily  . atorvastatin  80 mg Oral q1800  . metoprolol tartrate  12.5 mg Oral BID  . pantoprazole  40 mg Oral Q0600   Infusions:  . sodium chloride 150 mL/hr (03/06/13 0641)  . sodium chloride    . nitroGLYCERIN 5 mcg/min (03/06/13 0600)    Assessment: 52 yo male wtih PTCA stenting s/p PCI will be started on aggrastat x 18 hours.  Patient was on heparin therapy previously. CrCl >100.  On ASA and brilinta.  Goal of Therapy:   Monitor platelets by anticoagulation protocol: Yes   Plan:  1) Start aggrastat at 0.15 mcg/kg/min x 18 hours 2) Monitor CBC  Cartrell Bentsen, Tsz-Yin 03/06/2013,9:44 AM

## 2013-03-06 NOTE — Cardiovascular Report (Signed)
[name], [name]               ACCOUNT NO.:  [id number]  MEDICAL RECORD NO.:  [id number]  LOCATION:  6C05C                        FACILITY:  [facility]  PHYSICIAN:  Eduardo Osier. Sharyn Lull, M.D. DATE OF BIRTH:  16-Jun-1960  DATE OF PROCEDURE:  03/06/2013 DATE OF DISCHARGE:                           CARDIAC CATHETERIZATION   PROCEDURE: 1. Left cardiac cath with selective left and right coronary     angiography, left ventriculography via right groin using Judkins     technique. 2. Successful PTCA to proximal and mid RCA initially using 1.20 x 20     mm long mini Trek balloon and then 2.0 x 12 mm long Emerge balloon     followed by 2.75 x 20 mm long [place] Trek balloon. 3. Successful deployment of 2.75 x 38 mm long Xience Alpine drug-     eluting stent in mid and proximal RCA. 4. Successful postdilatation of this stent using 3.0 x 20 mm long [place]     Trek balloon.  INDICATION FOR THE PROCEDURE:  [name] is 52 year old male with past medical history significant for coronary artery disease, history of non- Q-wave myocardial infarction in July of 2013 status post PTCA stenting to mid RCA in July 2013, hypertension, history of CVA, hypercholesteremia, history of alcoholic pancreatitis, history of gastritis, history of alcohol abuse, tobacco abuse, degenerative joint disease, who came to the ER complaining of recurrent retrosternal chest pain associated with nausea, vomiting, diaphoresis off and on for last 2 days.  Did not seek any medical attention until earlier this morning, woke up around 2 a.m. with chest pain grade 10/10, so decided to come to ER.  Denies any shortness of breath.  Denies palpitation, lightheadedness, or syncope.  He states he stopped all his medication approximately 6 months ago.  The patient presently denies any chest pain.  EKG done in the ER showed normal sinus rhythm with small Q-waves in inferior leads with minimal ST elevation in leads 3, aVF with no reciprocal changes  with baseline artifact and nonspecific ST-T wave changes.  The patient was noted to have elevated troponin-I of 3.71. Repeat troponin I by lab was 10.59, CK was 850, MB of 108.  Due to typical anginal chest pain, significantly elevated cardiac enzymes, multiple risk factors, discussed with the patient regarding left cath, possible PTCA stenting, its risks and benefits, i.e., death, MI, stroke, need for emergency CABG, local vascular complications, etc., and consented for PCI.  DESCRIPTION OF PROCEDURE:  After obtaining the informed consent, patient was brought to the cath lab and was placed on fluoroscopy table.  Right groin was prepped and draped in usual fashion.  Xylocaine 1% was used for local anesthesia in the right groin.  With the help of thin wall needle, 6-French arterial sheath was placed.  The sheath was aspirated and flushed.  Next, 6-French left Judkins catheter was advanced over the wire under fluoroscopic guidance up to the ascending aorta.  Wire was pulled out.  The catheter was aspirated and connected to the Manifold. Catheter was further advanced and engaged into left coronary ostium. Multiple views of the left system were taken.  Next, catheter was disengaged and was pulled out over the  wire and was replaced with 6- French right Judkins catheter, which was advanced over the wire under fluoroscopic guidance up to the ascending aorta.  Wire was pulled out. The catheter was aspirated and connected to the Manifold.  Catheter was further advanced and engaged into right coronary ostium.  Multiple views of the right system were taken.  Next, the catheter was disengaged and was pulled out over the wire and was replaced with 6-French pigtail catheter which was advanced over the wire under fluoroscopic guidance up to the ascending aorta.  Wire was pulled out.  The catheter was aspirated and connected to the Manifold.  Catheter was further advanced across the aortic valve into  the LV.  LV pressures were recorded.  Next, LV graft was done in 30-degree RAO position.  Post-angiographic pressures were recorded from LV and then pullback pressures were recorded from the aorta.  There was no gradient across the aortic valve. Next, the pigtail catheter was pulled out over the wire.  Sheaths were aspirated and flushed.  FINDINGS:  LV showed infero, mid, and basal wall hypokinesia, EF of 50- 55%.  Left main was patent.  LAD has 20-30% proximal stenosis.  Diagonal 1 was small which has 20-25% sequential stenosis.  Diagonal 2 was very, very small.  Ramus was very, very small.  Left circumflex has 10-15% proximal stenosis.  OM 1 was moderate size, which has 50-60% proximal stenosis.  OM 2 was very, very small.  OM 3 was small which was patent. RCA has 90-95% proximal and 99% mid stenosis at the proximal edge of the stent with TIMI 2 distal flow.  INTERVENTIONAL PROCEDURE:  Successful PTCA to proximal and mid RCA was done, initially attempted to use 2.0 x 12 mm long Emerge balloon and then 1.5 x 15 mm long mini Trek balloon.  These balloons will not track through the mid lesion.  Subsequently 1.20 x 20 mm long mini Trek balloon was used for predilatation followed by 2.0 x 12 mm long Emerge Monorail balloon and then 2.75 x 20 mm long [place] Trek balloon for predilatation and then 2.75 x 38 mm long Xience Alpine drug-eluting stent was deployed in mid and proximal RCA at 10 atmospheric pressure. Subsequently the stent was post dilated using 3.0 x 20 mm long [place] Trek balloon going up to 15 and 18 atmospheric pressure.  Lesion dilated from 90-99% to 0% residual with excellent TIMI grade 3 distal flow without evidence of dissection or distal embolization.  The patient received weight-based Angiomax, aspirin, 180 mg of Brilinta prior to the procedure and also Aggrastat was started during the procedure.  The patient tolerated the procedure well.  There were no complications.   The patient was transferred to recovery room in stable condition.     Eduardo Osier. Sharyn Lull, M.D.     [facility]  D:  03/06/2013  T:  03/06/2013  Job:  [id number]

## 2013-03-06 NOTE — ED Notes (Signed)
Pt. reports mid chest pain onset yesterday afternoon with SOB , dry cough and emesis .

## 2013-03-06 NOTE — CV Procedure (Signed)
Left cardiac cath/PTCA stenting report dictated on 03/06/2013 dictation number is [phone]

## 2013-03-07 LAB — CBC
HCT: 31.4 % — ABNORMAL LOW (ref 39.0–52.0)
MCH: 27.9 pg (ref 26.0–34.0)
MCHC: 32.8 g/dL (ref 30.0–36.0)
MCV: 85.1 fL (ref 78.0–100.0)
Platelets: 195 10*3/uL (ref 150–400)
RDW: 14.2 % (ref 11.5–15.5)

## 2013-03-07 LAB — BASIC METABOLIC PANEL
BUN: 8 mg/dL (ref 6–23)
CO2: 23 mEq/L (ref 19–32)
Calcium: 8.4 mg/dL (ref 8.4–10.5)
Creatinine, Ser: 0.79 mg/dL (ref 0.50–1.35)
GFR calc Af Amer: >90 mL/min (ref >90)
GFR calc non Af Amer: >90 mL/min (ref >90)
Sodium: 141 mEq/L (ref 135–145)

## 2013-03-07 LAB — TROPONIN I: Troponin I: >20 ng/mL (ref <0.30)

## 2013-03-07 MED FILL — Sodium Chloride IV Soln 0.9%: INTRAVENOUS | Qty: 50 | Status: AC

## 2013-03-07 NOTE — Progress Notes (Signed)
[phone] Trop over 20 and pt not seen by cardiologist so did not walk. Pt's RN to walk later if order given. Education completed on MI,diet and ex. Pt did not quit smoking after last MI and does not know if he will be able to quit this time. Gave smoking cessation handouts and encouraged to call 1800quitnow. Pt states patches have not been helpful in past. Discussed triggers and changing setting. Pt did not change diet habits or start exercising last MI but states he knows he has to make changes now. Not interested in CRP2. Left brochure in case he changes his mind. Did not want MI booklet as he got one before.  Luetta Nutting RN BSN 03/07/2013 10:15 AM

## 2013-03-07 NOTE — Care Management Note (Signed)
    Page 1 of 1   03/07/2013     10:50:32 AM   CARE MANAGEMENT NOTE 03/07/2013  Patient:  [name], [name]   Account Number:  [id number]  Date Initiated:  03/07/2013  Documentation initiated by:  [facility]  Subjective/Objective Assessment:   52 yo male admitted with chest pain. //Home with spouse     Action/Plan:   LEFT HEART CATHETERIZATION WITH CORONARY ANGIOGRAM //Home with self care; benefits check for Brilinta.   Anticipated DC Date:  03/08/2013   Anticipated DC Plan:  HOME/SELF CARE      DC Planning Services  CM consult      Choice offered to / List presented to:             Status of service:  In process, will continue to follow Medicare Important Message given?   (If response is "NO", the following Medicare IM given date fields will be blank) Date Medicare IM given:   Date Additional Medicare IM given:    Discharge Disposition:    Per UR Regulation:    If discussed at Long Length of Stay Meetings, dates discussed:    Comments:  03/07/13 1000 Oletta Cohn, RN, BSN, [employer] [phone] Spoke with pt via telephone regarding benefits check for Brilinta.  Pt has brochure with 30 day free card and refill assistance card intact.  Pt utilizes [employer] in [place] [employer] [facility] [place]) for prescription needs.  NCM called pharmacy to confirm availability of medication. Information relayed to pt.  Pt verbalizes importance of filling medication upon discharge. Per rep at [facility]; brilinta is covered, tier 3, patient hasn't met their rx deductible of $310.00, patient will be responsible for 100% of total cost (estimated at $253.22).

## 2013-03-07 NOTE — Progress Notes (Signed)
HSubjective:  Patient denies any chest pain or shortness of breath. States feels better after PCI  Objective:  Vital Signs in the last 24 hours: Temp:  [98.2 F (36.8 C)-98.7 F (37.1 C)] 98.7 F (37.1 C) (12/23 0742) Pulse Rate:  [58-81] 79 (12/23 0742) Resp:  [16-20] 18 (12/23 0742) BP: (102-158)/(47-88) 141/73 mmHg (12/23 0742) SpO2:  [99 %-100 %] 100 % (12/23 0742) Weight:  [66 kg (145 lb 8.1 oz)] 66 kg (145 lb 8.1 oz) (12/23 0600)  Intake/Output from previous day: 12/22 0701 - 12/23 0700 In: 2010 [P.O.:360; I.V.:1650] Out: 325 [Urine:325] Intake/Output from this shift: Total I/O In: 360 [P.O.:360] Out: -   Physical Exam: Neck: no adenopathy, no carotid bruit, no JVD and supple, symmetrical, trachea midline Lungs: clear to auscultation bilaterally Heart: regular rate and rhythm, S1, S2 normal and Soft systolic murmur noted no S3 gallop Abdomen: soft, non-tender; bowel sounds normal; no masses,  no organomegaly Extremities: extremities normal, atraumatic, no cyanosis or edema and Right groin stable  Lab Results:  Recent Labs  03/06/13 1048 03/06/13 2112 03/07/13 0701  WBC 10.5  --  8.0  HGB 11.8*  --  10.3*  PLT 215 198 195    Recent Labs  03/06/13 1048 03/07/13 0701  NA 141 141  K 3.9 3.6  CL 105 108  CO2 23 23  GLUCOSE 78 87  BUN 8 8  CREATININE 0.65 0.79    Recent Labs  03/06/13 1810 03/06/13 2348  TROPONINI >20.00* >20.00*   Hepatic Function Panel  Recent Labs  03/06/13 1048  PROT 6.7  ALBUMIN 3.6  AST 141*  ALT 20  ALKPHOS 69  BILITOT 0.6   No results found for this basename: CHOL,  in the last 72 hours No results found for this basename: PROTIME,  in the last 72 hours  Imaging: Imaging results have been reviewed and Dg Chest 2 View  03/06/2013   CLINICAL DATA:  Mid chest pain.  EXAM: CHEST  2 VIEW  COMPARISON:  Chest radiograph performed 10/01/2011  FINDINGS: The lungs are well-aerated and clear. There is no evidence of focal  opacification, pleural effusion or pneumothorax.  The heart is normal in size; the mediastinal contour is within normal limits. No acute osseous abnormalities are seen.  IMPRESSION: No acute cardiopulmonary process seen.   Electronically Signed   By: Roanna Raider M.D.   On: 03/06/2013 02:35    Cardiac Studies:  Assessment/Plan:  Status post acute non-Q-wave myocardial infarction status post PCI to RCA with excellent results Coronary artery disease history of non-Q-wave MI in the past status post PCI to mid RCA in the past Hypertension Hypercholesteremia History of CVA History of chronic pancreatitis History of  gastritis History of EtOH abuse Tobacco abuse Degenerative joint disease Plan Continue present management Check labs in a.m. Possible discharge tomorrow stable and enzymes trending down  LOS: 1 day    [name] N 03/07/2013, 10:35 AM

## 2013-03-08 LAB — BASIC METABOLIC PANEL
CO2: 24 mEq/L (ref 19–32)
Calcium: 8.3 mg/dL — ABNORMAL LOW (ref 8.4–10.5)
Creatinine, Ser: 0.86 mg/dL (ref 0.50–1.35)
GFR calc non Af Amer: >90 mL/min (ref >90)
Potassium: 3.5 mEq/L (ref 3.5–5.1)
Sodium: 141 mEq/L (ref 135–145)

## 2013-03-08 LAB — TROPONIN I: Troponin I: 8.48 ng/mL (ref <0.30)

## 2013-03-08 LAB — CBC
MCH: 28.1 pg (ref 26.0–34.0)
MCHC: 33.1 g/dL (ref 30.0–36.0)
MCV: 84.8 fL (ref 78.0–100.0)
Platelets: 202 10*3/uL (ref 150–400)
RBC: 3.63 MIL/uL — ABNORMAL LOW (ref 4.22–5.81)
RDW: 13.9 % (ref 11.5–15.5)

## 2013-03-08 MED ORDER — METOPROLOL TARTRATE 12.5 MG HALF TABLET: 12.5000 mg | ORAL_TABLET | Freq: Two times a day (BID) | ORAL | Status: DC

## 2013-03-08 MED ORDER — ATORVASTATIN CALCIUM 80 MG PO TABS: 80.0000 mg | ORAL_TABLET | Freq: Every day | ORAL | Status: DC

## 2013-03-08 MED ORDER — ASPIRIN 81 MG PO TBEC: 81.0000 mg | DELAYED_RELEASE_TABLET | Freq: Every day | ORAL | Status: AC

## 2013-03-08 MED ORDER — NICOTINE 7 MG/24HR TD PT24: 7.0000 mg | MEDICATED_PATCH | Freq: Every day | TRANSDERMAL | Status: DC

## 2013-03-08 MED ORDER — TICAGRELOR 90 MG PO TABS: 90.0000 mg | ORAL_TABLET | Freq: Two times a day (BID) | ORAL | Status: DC

## 2013-03-08 MED ORDER — NITROGLYCERIN 0.4 MG SL SUBL: 0.4000 mg | SUBLINGUAL_TABLET | SUBLINGUAL | Status: DC | PRN

## 2013-03-08 NOTE — Discharge Summary (Signed)
  Discharge summary dictated on 03/08/2013 dictation number is [phone]

## 2013-03-08 NOTE — Discharge Summary (Signed)
[name], [name]               ACCOUNT NO.:  [id number]  MEDICAL RECORD NO.:  [id number]  LOCATION:  6C05C                        FACILITY:  [facility]  PHYSICIAN:  Eduardo Osier. Sharyn Lull, M.D. DATE OF BIRTH:  Sep 09, 1960  DATE OF ADMISSION:  03/06/2013 DATE OF DISCHARGE:  03/08/2013                              DISCHARGE SUMMARY   ADMITTING DIAGNOSES: 1. Acute non-ST elevation myocardial infarction. 2. Coronary artery disease, history of non-Q-wave myocardial     infarction in the past, status post PCI to RCA in July 2013. 3. Hypertension. 4. Hypercholesteremia. 5. History of cerebrovascular accident. 6. History of chronic pancreatitis. 7. History of alcoholic gastritis. 8. History of alcohol abuse. 9. Tobacco abuse. 10.Degenerative joint disease.  DISCHARGE DIAGNOSES: 1. Status post non-ST elevation myocardial infarction, status post PCI     to proximal and mid RCA with excellent results as per procedure     report. 2. Coronary artery disease, history of non-Q-wave myocardial     infarction in the past, status post PCI to mid RCA in the past. 3. Hypertension. 4. Hypercholesterolemia. 5. History of cerebrovascular accident. 6. History of pancreatitis. 7. History of alcoholic gastritis. 8. History of alcohol abuse. 9. Tobacco abuse. 10.Degenerative joint disease.  DISCHARGE HOME MEDICATIONS:  Aspirin 81 mg daily, atorvastatin 80 mg daily, metoprolol tartrate 12.5 mg twice daily, nicotine patch 7 mg daily, Nitrostat 0.4 mg sublingual use as directed, Brilinta 90 mg twice daily.  DIET:  Low-salt, low-cholesterol.  Post-PTCA stent instructions have been given.  Increase activity slowly as tolerated.  The patient will be scheduled for phase 2 cardiac rehab as outpatient.  The patient has been advised regarding lifestyle modification, compliance with medication, smoking cessation, etc., and diet.  CONDITION AT DISCHARGE:  Stable.  BRIEF HISTORY AND HOSPITAL COURSE:  Mr.  Hinderman is a 52 year old male with past medical history significant for coronary artery disease, history of non-Q-wave myocardial infarction in July of 2013, status post PTCA and stenting to mid RCA, hypertension, history of CVA, hypercholesterolemia , history of alcoholic pancreatitis, history of gastritis, history of alcohol abuse, tobacco abuse, degenerative joint disease.  He came to the ER complaining of recurrent retrosternal chest pain associated with nausea, vomiting, and diaphoresis off and on since last 2 days.  He did not seek any medical attention.  Earlier this morning, he woke up around 2 a.m. with chest pain grade 10/10, so he decided to come to the ER.  The patient denies any shortness of breath. He denies palpitation, lightheadedness, or syncope.  He states he stopped all his medication approximately 6 months ago.  He presently denies any chest pain.  EKG done in the ER showed normal sinus rhythm with small Q waves and inferior leads with minimal ST elevation in lead 3 and aVF with no reciprocal changes with baseline artifacts and nonspecific ST-T wave changes.  PAST MEDICAL HISTORY:  As above.  PAST SURGICAL HISTORY:  He had mid RCA in July 2013.  He had back surgery in the past.  PHYSICAL EXAMINATION:  GENERAL:  He was alert, awake, oriented x3. VITAL SIGNS:  Blood pressure was 80/43.  He was on nitro drip at that time.  Pulse was  59. HEENT:  Conjunctivae were pink. NECK:  Supple.  No JVD.  No bruit. LUNGS:  Clear to auscultation without rhonchi or rales. CARDIOVASCULAR:  S1, S2 was normal.  There was soft systolic murmur and S4 gallop. ABDOMEN:  Soft.  Bowel sounds were present.  Nontender. EXTREMITIES:  There was no clubbing, cyanosis, or edema.  LABORATORY DATA:  Sodium was 137, potassium 3.5, BUN 9, creatinine 0.68, glucose was 159.  Hemoglobin was 12.9, hematocrit 37.7, white count of 11.0.  First set of troponin I was 3.71, repeat troponin I was 10.59,  CK was 850, MB of 108, repeat fasting blood sugar was 78.  Troponin I post PCI was about 20 x2.  Today, troponin is trending down to 8.48.  Today his lab, sodium is 141, potassium 3.5, BUN 9, creatinine 0.86, glucose is 88.  Hemoglobin is 10.2, hematocrit 30.8, white count of 8.9, platelet count 202,000, which has been stable since yesterday.  EKG done yesterday showed normal sinus rhythm with poor R-wave progression in anterior leads.  BRIEF HOSPITAL COURSE:  The patient was admitted to CCU.  He was taken to the cardiac cath lab and underwent PTCA stenting to mid and proximal RCA as per procedure report with excellent results.  Patient did not have any chest pain post procedure.  His groin was stable with no evidence of hematoma or bruit.  Phase 1 cardiac rehab was called.  The patient has been ambulating in hallway and room without any problems. The patient will be discharged home on above medications and will be followed up in my office in 1 week.     Eduardo Osier. Sharyn Lull, M.D.     [facility]  D:  03/08/2013  T:  03/08/2013  Job:  [id number]

## 2013-03-08 NOTE — Progress Notes (Signed)
CARDIAC REHAB PHASE I   PRE:  Rate/Rhythm: 78SR  BP:  Supine:   Sitting: 144/68  Standing:    SaO2:   MODE:  Ambulation: 1000 ft   POST:  Rate/Rhythm: 90SR  BP:  Supine:   Sitting: 144/59  Standing:    SaO2:  [id number] Pt walked 1000 ft with steady gait. No CP. Tolerated well. No questions re education. Re enforced smoking cessation.   Luetta Nutting, RN BSN  03/08/2013 8:14 AM

## 2013-03-13 ENCOUNTER — Encounter (HOSPITAL_COMMUNITY): Payer: Self-pay | Admitting: Emergency Medicine

## 2013-03-13 ENCOUNTER — Emergency Department (INDEPENDENT_AMBULATORY_CARE_PROVIDER_SITE_OTHER)
Admission: EM | Admit: 2013-03-13 | Discharge: 2013-03-13 | Disposition: A | Discharge status: Home / Self Care | Payer: Medicare Other | Source: Home / Self Care | Attending: Family Medicine | Admitting: Family Medicine

## 2013-03-13 DIAGNOSIS — L250 Unspecified contact dermatitis due to cosmetics: Secondary | ICD-10-CM

## 2013-03-13 DIAGNOSIS — L243 Irritant contact dermatitis due to cosmetics: Secondary | ICD-10-CM

## 2013-03-13 MED ORDER — PREDNISONE 10 MG PO KIT: PACK | ORAL | Status: DC

## 2013-03-13 MED ORDER — CETIRIZINE HCL 10 MG PO TABS: 10.0000 mg | ORAL_TABLET | Freq: Every day | ORAL | Status: DC

## 2013-03-13 MED ORDER — FAMOTIDINE 20 MG PO TABS: 20.0000 mg | ORAL_TABLET | Freq: Two times a day (BID) | ORAL | Status: DC

## 2013-03-13 NOTE — ED Notes (Signed)
Pt c/o poss allergic reaction to hair dye he applied to face and head.... C/o facial swelling (bilateral cheeks) onset 2 days that's getting bigger Denies: dyspnea, SOB, difficulty swallowing He is alert signs of acute distress.

## 2013-03-13 NOTE — ED Provider Notes (Signed)
[name] is a 52 y.o. male who presents to Urgent Care today for Facial swelling.  Patient applied just for men beard dye on December 26. That evening he noted facial swelling. He has washed his face multiple times however the swelling has persisted. He notes swelling of his upper lip and cheeks. He has not worsened since the 26th. He denies any tongue swelling or difficulty breathing or swallowing. He has not tried any medications for his problem yet.   However complicating his history he is a recent NSTEMI; he was discharged from the hospital on December 24th. His medication list below is up-to-date and accurate. He is not taking any ACE inhibitors or ARB.    Past Medical History  Diagnosis Date  . Hypertension   . Stroke   . Hyperlipidemia   . Depression   . Chronic pancreatitis   . Substance abuse   . Seizures   . Myocardial infarction   . Coronary artery disease    History  Substance Use Topics  . Smoking status: Current Some Day Smoker -- 1.00 packs/day for 35 years    Types: Cigarettes  . Smokeless tobacco: Never Used     Comment: working on quitting  . Alcohol Use: Yes     Comment: beer every other day   ROS as above Medications reviewed. No current facility-administered medications for this encounter.   Current Outpatient Prescriptions  Medication Sig Dispense Refill  . Ticagrelor (BRILINTA) 90 MG TABS tablet Take 1 tablet (90 mg total) by mouth 2 (two) times daily.  60 tablet  11  . aspirin EC 81 MG EC tablet Take 1 tablet (81 mg total) by mouth daily.  30 tablet  3  . atorvastatin (LIPITOR) 80 MG tablet Take 1 tablet (80 mg total) by mouth daily at 6 PM.  30 tablet  3  . cetirizine (ZYRTEC) 10 MG tablet Take 1 tablet (10 mg total) by mouth daily.  30 tablet  0  . famotidine (PEPCID) 20 MG tablet Take 1 tablet (20 mg total) by mouth 2 (two) times daily.  60 tablet  0  . metoprolol tartrate (LOPRESSOR) 12.5 mg TABS tablet Take 0.5 tablets (12.5 mg total) by  mouth 2 (two) times daily.  30 tablet  3  . nicotine (NICODERM CQ - DOSED IN MG/24 HR) 7 mg/24hr patch Place 1 patch (7 mg total) onto the skin daily.  28 patch  1  . nitroGLYCERIN (NITROSTAT) 0.4 MG SL tablet Place 1 tablet (0.4 mg total) under the tongue every 5 (five) minutes x 3 doses as needed for chest pain.  25 tablet  12  . PredniSONE 10 MG KIT 12 day dose pack po  1 kit  0    Exam:  BP 114/66  Pulse 93  Temp(Src) 98.2 F (36.8 C) (Oral)  Resp 16  SpO2 97% Gen: Well NAD HEENT: EOMI,  MMM, no tongue swelling. Patient has cheek and upper lip swelling and skin irritation.  Lungs: Normal work of breathing. CTABL Heart: RRR no MRG Abd: NABS, Soft. NT, ND Exts:warm and well perfused.     Assessment and Plan: 52 y.o. male with contact irritant dermatitis. Patient essentially is allergic to hair dye.  Plan to use prednisone taper, and H1 and H2 blockers. Followup if not improved. Patient will present to the emergency room if worsening.  He will avoid hair dye in the future. Discussed warning signs or symptoms. Please see discharge instructions. Patient expresses understanding.  Rodolph Bong, MD 03/13/13 [id number]

## 2013-03-19 ENCOUNTER — Observation Stay (HOSPITAL_COMMUNITY)
Admission: EM | Admit: 2013-03-19 | Discharge: 2013-03-20 | Disposition: A | Discharge status: Home / Self Care | Payer: Medicare Other | Attending: Cardiology | Admitting: Cardiology

## 2013-03-19 ENCOUNTER — Emergency Department (HOSPITAL_COMMUNITY): Payer: Medicare Other

## 2013-03-19 ENCOUNTER — Encounter (HOSPITAL_COMMUNITY): Payer: Self-pay | Admitting: Emergency Medicine

## 2013-03-19 DIAGNOSIS — R112 Nausea with vomiting, unspecified: Secondary | ICD-10-CM | POA: Insufficient documentation

## 2013-03-19 DIAGNOSIS — L301 Dyshidrosis [pompholyx]: Secondary | ICD-10-CM | POA: Insufficient documentation

## 2013-03-19 DIAGNOSIS — Z8673 Personal history of transient ischemic attack (TIA), and cerebral infarction without residual deficits: Secondary | ICD-10-CM | POA: Insufficient documentation

## 2013-03-19 DIAGNOSIS — I10 Essential (primary) hypertension: Secondary | ICD-10-CM | POA: Insufficient documentation

## 2013-03-19 DIAGNOSIS — I252 Old myocardial infarction: Secondary | ICD-10-CM | POA: Insufficient documentation

## 2013-03-19 DIAGNOSIS — F17200 Nicotine dependence, unspecified, uncomplicated: Secondary | ICD-10-CM | POA: Insufficient documentation

## 2013-03-19 DIAGNOSIS — G40909 Epilepsy, unspecified, not intractable, without status epilepticus: Secondary | ICD-10-CM | POA: Insufficient documentation

## 2013-03-19 DIAGNOSIS — I2510 Atherosclerotic heart disease of native coronary artery without angina pectoris: Secondary | ICD-10-CM | POA: Insufficient documentation

## 2013-03-19 DIAGNOSIS — F3289 Other specified depressive episodes: Secondary | ICD-10-CM | POA: Insufficient documentation

## 2013-03-19 DIAGNOSIS — Z950 Presence of cardiac pacemaker: Secondary | ICD-10-CM | POA: Insufficient documentation

## 2013-03-19 DIAGNOSIS — K861 Other chronic pancreatitis: Secondary | ICD-10-CM | POA: Insufficient documentation

## 2013-03-19 DIAGNOSIS — R072 Precordial pain: Principal | ICD-10-CM | POA: Insufficient documentation

## 2013-03-19 DIAGNOSIS — F329 Major depressive disorder, single episode, unspecified: Secondary | ICD-10-CM | POA: Insufficient documentation

## 2013-03-19 DIAGNOSIS — E785 Hyperlipidemia, unspecified: Secondary | ICD-10-CM | POA: Insufficient documentation

## 2013-03-19 DIAGNOSIS — R079 Chest pain, unspecified: Secondary | ICD-10-CM | POA: Diagnosis present

## 2013-03-19 LAB — BASIC METABOLIC PANEL
BUN: 14 mg/dL (ref 6–23)
CO2: 24 mEq/L (ref 19–32)
CREATININE: 1.09 mg/dL (ref 0.50–1.35)
Calcium: 8.9 mg/dL (ref 8.4–10.5)
Chloride: 101 mEq/L (ref 96–112)
GFR, EST AFRICAN AMERICAN: 90 mL/min — AB (ref >90)
GFR, EST NON AFRICAN AMERICAN: 77 mL/min — AB (ref >90)
Glucose, Bld: 204 mg/dL — ABNORMAL HIGH (ref 70–99)
Potassium: 3.9 mEq/L (ref 3.7–5.3)
Sodium: 141 mEq/L (ref 137–147)

## 2013-03-19 LAB — CBC
HEMATOCRIT: 37.8 % — AB (ref 39.0–52.0)
Hemoglobin: 12.7 g/dL — ABNORMAL LOW (ref 13.0–17.0)
MCH: 28.3 pg (ref 26.0–34.0)
MCHC: 33.6 g/dL (ref 30.0–36.0)
MCV: 84.4 fL (ref 78.0–100.0)
PLATELETS: 311 10*3/uL (ref 150–400)
RBC: 4.48 MIL/uL (ref 4.22–5.81)
RDW: 14.6 % (ref 11.5–15.5)
WBC: 14.8 10*3/uL — AB (ref 4.0–10.5)

## 2013-03-19 LAB — PRO B NATRIURETIC PEPTIDE: PRO B NATRI PEPTIDE: 726.7 pg/mL — AB (ref 0–125)

## 2013-03-19 LAB — POCT I-STAT TROPONIN I: Troponin i, poc: 0 ng/mL (ref 0.00–0.08)

## 2013-03-19 LAB — TROPONIN I: Troponin I: <0.3 ng/mL (ref <0.30)

## 2013-03-19 MED ORDER — METOPROLOL TARTRATE 12.5 MG HALF TABLET
12.5000 mg | ORAL_TABLET | Freq: Two times a day (BID) | ORAL | Status: DC
  Administered 2013-03-19: 12.5 mg via ORAL
  Filled 2013-03-19 (×3): qty 1

## 2013-03-19 MED ORDER — NITROGLYCERIN 2 % TD OINT
1.0000 [in_us] | TOPICAL_OINTMENT | Freq: Once | TRANSDERMAL | Status: AC
  Administered 2013-03-19: 1 [in_us] via TOPICAL
  Filled 2013-03-19: qty 1

## 2013-03-19 MED ORDER — VITAMIN B-1 100 MG PO TABS
100.0000 mg | ORAL_TABLET | Freq: Every day | ORAL | Status: DC
  Filled 2013-03-19: qty 1

## 2013-03-19 MED ORDER — PANTOPRAZOLE SODIUM 40 MG PO TBEC: 40.0000 mg | DELAYED_RELEASE_TABLET | Freq: Every day | ORAL | Status: DC

## 2013-03-19 MED ORDER — ASPIRIN 81 MG PO CHEW: 324.0000 mg | CHEWABLE_TABLET | ORAL | Status: DC

## 2013-03-19 MED ORDER — NITROGLYCERIN 0.4 MG SL SUBL: 0.4000 mg | SUBLINGUAL_TABLET | SUBLINGUAL | Status: DC | PRN

## 2013-03-19 MED ORDER — ASPIRIN EC 81 MG PO TBEC
81.0000 mg | DELAYED_RELEASE_TABLET | Freq: Every day | ORAL | Status: DC
  Filled 2013-03-19: qty 1

## 2013-03-19 MED ORDER — TICAGRELOR 90 MG PO TABS
90.0000 mg | ORAL_TABLET | Freq: Two times a day (BID) | ORAL | Status: DC
  Filled 2013-03-19 (×2): qty 1

## 2013-03-19 MED ORDER — ASPIRIN 300 MG RE SUPP
300.0000 mg | RECTAL | Status: DC
  Filled 2013-03-19: qty 1

## 2013-03-19 MED ORDER — ONDANSETRON HCL 4 MG/2ML IJ SOLN
4.0000 mg | Freq: Four times a day (QID) | INTRAMUSCULAR | Status: DC | PRN
  Administered 2013-03-19: 4 mg via INTRAVENOUS
  Filled 2013-03-19: qty 2

## 2013-03-19 MED ORDER — ACETAMINOPHEN 325 MG PO TABS: 650.0000 mg | ORAL_TABLET | ORAL | Status: DC | PRN

## 2013-03-19 MED ORDER — LORATADINE 10 MG PO TABS
10.0000 mg | ORAL_TABLET | Freq: Every day | ORAL | Status: DC
  Filled 2013-03-19: qty 1

## 2013-03-19 MED ORDER — INSULIN ASPART 100 UNIT/ML ~~LOC~~ SOLN: 0.0000 [IU] | Freq: Three times a day (TID) | SUBCUTANEOUS | Status: DC

## 2013-03-19 MED ORDER — ONDANSETRON HCL 4 MG/2ML IJ SOLN
4.0000 mg | Freq: Once | INTRAMUSCULAR | Status: AC
  Administered 2013-03-19: 4 mg via INTRAVENOUS
  Filled 2013-03-19: qty 2

## 2013-03-19 MED ORDER — PREDNISONE 10 MG PO TABS
10.0000 mg | ORAL_TABLET | Freq: Every day | ORAL | Status: DC
  Filled 2013-03-19: qty 1

## 2013-03-19 MED ORDER — GABAPENTIN 100 MG PO CAPS
200.0000 mg | ORAL_CAPSULE | Freq: Three times a day (TID) | ORAL | Status: DC
  Administered 2013-03-19: 200 mg via ORAL
  Filled 2013-03-19 (×4): qty 2

## 2013-03-19 MED ORDER — FOLIC ACID 1 MG PO TABS
1.0000 mg | ORAL_TABLET | Freq: Every day | ORAL | Status: DC
  Filled 2013-03-19: qty 1

## 2013-03-19 MED ORDER — ATORVASTATIN CALCIUM 80 MG PO TABS
80.0000 mg | ORAL_TABLET | Freq: Every day | ORAL | Status: DC
  Filled 2013-03-19: qty 1

## 2013-03-19 MED ORDER — ASPIRIN 81 MG PO CHEW
324.0000 mg | CHEWABLE_TABLET | Freq: Once | ORAL | Status: AC
  Administered 2013-03-19: 324 mg via ORAL
  Filled 2013-03-19: qty 4

## 2013-03-19 MED ORDER — FAMOTIDINE 20 MG PO TABS
20.0000 mg | ORAL_TABLET | Freq: Two times a day (BID) | ORAL | Status: DC
  Administered 2013-03-19: 20 mg via ORAL
  Filled 2013-03-19 (×3): qty 1

## 2013-03-19 NOTE — ED Notes (Signed)
Pt self administered 324 ASA, and nitro x 1 SL. EMS administered Nitro x 2 SL

## 2013-03-19 NOTE — ED Notes (Signed)
Ward MD at bedside. 

## 2013-03-19 NOTE — ED Provider Notes (Signed)
I saw and evaluated the patient, reviewed the resident's note and I agree with the findings and plan.  EKG Interpretation    Date/Time:  Sunday March 19 2013 19:49:12 EST Ventricular Rate:  90 PR Interval:  111 QRS Duration: 79 QT Interval:  393 QTC Calculation: 481 R Axis:   56 Text Interpretation:  Sinus rhythm Borderline short PR interval Borderline T abnormalities, inferior leads Borderline prolonged QT interval Confirmed by Makhari Dovidio  DO, Mekiah Wahler ([id number]) on 03/19/2013 7:58:24 PM             [facility] 03/06/13: PROCEDURE:  1. Left cardiac cath with selective left and right coronary  angiography, left ventriculography via right groin using Judkins  technique.  2. Successful PTCA to proximal and mid RCA initially using 1.20 x 20  mm long mini Trek balloon and then 2.0 x 12 mm long Emerge balloon  followed by 2.75 x 20 mm long [place] Trek balloon.  3. Successful deployment of 2.75 x 38 mm long Xience Alpine drug-  eluting stent in mid and proximal RCA.  4. Successful postdilatation of this stent using 3.0 x 20 mm long [place]  Trek balloon.    Warren, DO 03/19/13 [id number]

## 2013-03-19 NOTE — ED Provider Notes (Signed)
CSN: [id number]     Arrival date & time 03/19/13  1939 History   First MD Initiated Contact with Patient 03/19/13 1940     Chief Complaint  Patient presents with  . Chest Pain   (Consider location/radiation/quality/duration/timing/severity/associated sxs/prior Treatment) Patient is a 53 y.o. male presenting with chest pain.  Chest Pain Pain location:  Substernal area Pain quality: pressure and sharp   Pain radiates to:  Does not radiate Pain radiates to the back: no   Pain severity:  Severe Onset quality:  Sudden Duration:  1 hour Timing:  Constant Chronicity:  Recurrent Relieved by:  Nitroglycerin and aspirin Associated symptoms: diaphoresis, nausea and vomiting   Associated symptoms: no abdominal pain, no altered mental status, no back pain, no cough, no fever, no headache and no shortness of breath   Risk factors: coronary artery disease (recent cath)     Past Medical History  Diagnosis Date  . Hypertension   . Stroke   . Hyperlipidemia   . Depression   . Chronic pancreatitis   . Substance abuse   . Seizures   . Myocardial infarction   . Coronary artery disease    Past Surgical History  Procedure Laterality Date  . Back surgery    . Coronary stent placement     History reviewed. No pertinent family history. History  Substance Use Topics  . Smoking status: Current Some Day Smoker -- 0.33 packs/day for 35 years    Types: Cigarettes  . Smokeless tobacco: Never Used     Comment: working on quitting  . Alcohol Use: Yes     Comment: beer every other day    Review of Systems  Constitutional: Positive for diaphoresis. Negative for fever and chills.  HENT: Negative for sore throat.   Eyes: Negative for pain.  Respiratory: Negative for cough and shortness of breath.   Cardiovascular: Positive for chest pain.  Gastrointestinal: Positive for nausea and vomiting. Negative for abdominal pain.  Genitourinary: Negative for dysuria and flank pain.  Musculoskeletal:  Negative for back pain and neck pain.  Skin: Negative for rash.  Neurological: Negative for seizures and headaches.    Allergies  Morphine and related  Home Medications   No current outpatient prescriptions on file. BP 161/84  Pulse 62  Temp(Src) 98 F (36.7 C) (Oral)  Resp 18  Ht 5\' 11"  (1.803 m)  Wt 151 lb 12.8 oz (68.856 kg)  BMI 21.18 kg/m2  SpO2 100% Physical Exam  Constitutional: He is oriented to person, place, and time. He appears well-developed and well-nourished. He appears ill.  HENT:  Head: Normocephalic and atraumatic.  Eyes: Pupils are equal, round, and reactive to light.  Neck: Normal range of motion.  Cardiovascular: Normal rate, regular rhythm and S1 normal.   Pulmonary/Chest: Effort normal and breath sounds normal.  Abdominal: Soft. He exhibits no distension. There is no tenderness.  Musculoskeletal: Normal range of motion.  Neurological: He is alert and oriented to person, place, and time.  Skin: Skin is warm. He is diaphoretic.    ED Course  Procedures (including critical care time) Labs Review Labs Reviewed  CBC - Abnormal; Notable for the following:    WBC 14.8 (*)    Hemoglobin 12.7 (*)    HCT 37.8 (*)    All other components within normal limits  BASIC METABOLIC PANEL - Abnormal; Notable for the following:    Glucose, Bld 204 (*)    GFR calc non Af Amer 77 (*)    GFR  calc Af Amer 90 (*)    All other components within normal limits  PRO B NATRIURETIC PEPTIDE - Abnormal; Notable for the following:    Pro B Natriuretic peptide (BNP) 726.7 (*)    All other components within normal limits  TROPONIN I  TROPONIN I  TROPONIN I  HEMOGLOBIN A1C  LIPID PANEL  CBC  BASIC METABOLIC PANEL  PROTIME-INR  POCT I-STAT TROPONIN I   Imaging Review Dg Chest 2 View  03/19/2013   CLINICAL DATA:  Centralized chest pain for 1 day. History of heart attack 2 weeks ago.  EXAM: CHEST  2 VIEW  COMPARISON:  DG CHEST 2 VIEW dated 03/06/2013; DG CHEST 2 VIEW dated  10/01/2011; DG CHEST 1V PORT dated 01/23/2009; DG CHEST 2 VIEW dated 09/24/2008; CT ANGIO CHEST W/CM &/OR WO/CM dated 09/24/2008  FINDINGS: The heart size and mediastinal contours are within normal limits. Both lungs are clear. The visualized skeletal structures are unremarkable.  IMPRESSION: No active cardiopulmonary disease.   Electronically Signed   By: Kathreen Devoid   On: 03/19/2013 20:42    EKG Interpretation    Date/Time:  Sunday March 19 2013 19:49:12 EST Ventricular Rate:  90 PR Interval:  111 QRS Duration: 79 QT Interval:  393 QTC Calculation: 481 R Axis:   56 Text Interpretation:  Sinus rhythm Borderline short PR interval Borderline T abnormalities, inferior leads Borderline prolonged QT interval Confirmed by WARD  DO, KRISTEN ([id number]) on 03/19/2013 7:58:24 PM            MDM   1. Chest pain at rest     52  yo M with sig hx of recent heart cath, multiple MIs, who presents with acute onset chest pain while at rest. Sharp, pressure-like with associated n/v, diaphoresis.   Given recent heart cath, will treat with nitro paste, will check basic labs. Likely admit to cardiology given recent catheterization.   Basic labs demonstrate leukocytosis. EKG demonstrates T wave changes, no ST changes. No troponin elevation. Cardiologist consulted, will admit to cardiology. Transferred to floor in stable condition. Patient seen and evaluated by myself and my attending, Dr. Leonides Schanz.      Freddi Che, MD 03/20/13 0001

## 2013-03-19 NOTE — ED Notes (Signed)
Pt states CP is gone but now he feels like he has indigestion

## 2013-03-19 NOTE — ED Notes (Signed)
Pt had MI on 21st, with cath. His MD is doctor Harwani. 30 minutes experienced sharp pain in center chest while sitting on couch. Diaphoretic, with nausea/vomiting

## 2013-03-19 NOTE — ED Provider Notes (Signed)
I saw and evaluated the patient, reviewed the resident's note and I agree with the findings and plan.  EKG Interpretation    Date/Time:  Sunday March 19 2013 19:49:12 EST Ventricular Rate:  90 PR Interval:  111 QRS Duration: 79 QT Interval:  393 QTC Calculation: 481 R Axis:   56 Text Interpretation:  Sinus rhythm Borderline short PR interval Borderline T abnormalities, inferior leads Borderline prolonged QT interval Confirmed by Jacci Ruberg  DO, Mycal Conde [id number]) on 03/19/2013 7:58:24 PM            Patient is a 53 year old male with history of cardiac disease, hypertension, hyperlipidemia, stroke who was recently admitted to the hospital on 03/06/13 for in STEMI and had stents placed in his mid and proximal RCA by Dr. Terrence Dupont who presents emergency department with sudden onset substernal chest pain with shortness of breath, nausea and vomiting and diaphoresis that started at rest. Reports this feels similar to his prior cardiac pain. He is currently hemodynamically stable. His pain has improved with nitroglycerin. Will check cardiac labs, chest x-ray and discuss with cardiology. EKG shows no new ischemic changes.   Troponin negative. Patient does have leukocytosis which may be reactive. Chest x-ray clear. Discussed with cardiology for admission.  [place], DO 03/19/13 2251

## 2013-03-19 NOTE — ED Notes (Addendum)
Doylene Canard MD at bedside.

## 2013-03-19 NOTE — H&P (Signed)
[name] is an 54 y.o. male.   Chief Complaint: Chest pain HPI: 53 year old male with recent right coronary artery proximal stent 2 weeks ago after non-Q wave MI, has 30 minutes of sharp retrosternal chest pain improved with 3 SL NTG. +ve nausea and sweating. No fever or cough.  Past Medical History  Diagnosis Date  . Hypertension   . Stroke   . Hyperlipidemia   . Depression   . Chronic pancreatitis   . Substance abuse   . Seizures   . Myocardial infarction   . Coronary artery disease       Past Surgical History  Procedure Laterality Date  . Back surgery    . Coronary stent placement      No family history on file. Social History:  reports that he has been smoking Cigarettes.  He has a 35 pack-year smoking history. He has never used smokeless tobacco. He reports that he drinks alcohol. He reports that he does not use illicit drugs.  Allergies:  Allergies  Allergen Reactions  . Morphine And Related Itching     (Not in a hospital admission)  Results for orders placed during the hospital encounter of 03/19/13 (from the past 48 hour(s))  CBC     Status: Abnormal   Collection Time    03/19/13  7:52 PM      Result Value Range   WBC 14.8 (*) 4.0 - 10.5 K/uL   RBC 4.48  4.22 - 5.81 MIL/uL   Hemoglobin 12.7 (*) 13.0 - 17.0 g/dL   HCT 37.8 (*) 39.0 - 52.0 %   MCV 84.4  78.0 - 100.0 fL   MCH 28.3  26.0 - 34.0 pg   MCHC 33.6  30.0 - 36.0 g/dL   RDW 14.6  11.5 - 15.5 %   Platelets 311  150 - 400 K/uL  BASIC METABOLIC PANEL     Status: Abnormal   Collection Time    03/19/13  7:52 PM      Result Value Range   Sodium 141  137 - 147 mEq/L   Comment: Please note change in reference range.   Potassium 3.9  3.7 - 5.3 mEq/L   Comment: Please note change in reference range.   Chloride 101  96 - 112 mEq/L   CO2 24  19 - 32 mEq/L   Glucose, Bld 204 (*) 70 - 99 mg/dL   BUN 14  6 - 23 mg/dL   Creatinine, Ser 1.09  0.50 - 1.35 mg/dL   Calcium 8.9  8.4 - 10.5 mg/dL   GFR calc non Af Amer 77 (*) >90 mL/min   GFR calc Af Amer 90 (*) >90 mL/min   Comment: (NOTE)     The eGFR has been calculated using the CKD EPI equation.     This calculation has not been validated in all clinical situations.     eGFR's persistently <90 mL/min signify possible Chronic Kidney     Disease.  PRO B NATRIURETIC PEPTIDE     Status: Abnormal   Collection Time    03/19/13  7:52 PM      Result Value Range   Pro B Natriuretic peptide (BNP) 726.7 (*) 0 - 125 pg/mL  POCT I-STAT TROPONIN I     Status: None   Collection Time    03/19/13  8:06 PM      Result Value Range   Troponin i, poc 0.00  0.00 - 0.08 ng/mL   Comment 3  Comment: Due to the release kinetics of cTnI,     a negative result within the first hours     of the onset of symptoms does not rule out     myocardial infarction with certainty.     If myocardial infarction is still suspected,     repeat the test at appropriate intervals.   Dg Chest 2 View  03/19/2013   CLINICAL DATA:  Centralized chest pain for 1 day. History of heart attack 2 weeks ago.  EXAM: CHEST  2 VIEW  COMPARISON:  DG CHEST 2 VIEW dated 03/06/2013; DG CHEST 2 VIEW dated 10/01/2011; DG CHEST 1V PORT dated 01/23/2009; DG CHEST 2 VIEW dated 09/24/2008; CT ANGIO CHEST W/CM &/OR WO/CM dated 09/24/2008  FINDINGS: The heart size and mediastinal contours are within normal limits. Both lungs are clear. The visualized skeletal structures are unremarkable.  IMPRESSION: No active cardiopulmonary disease.   Electronically Signed   By: Kathreen Devoid   On: 03/19/2013 20:42   EKG-SR, IWMI. ROS Constitutional: Negative for fever, chills and weight loss.  Eyes: Negative for blurred vision, double vision and photophobia.  Cardiovascular: Positive for chest pain. Negative for palpitations, orthopnea, claudication and leg swelling.  Gastrointestinal: Positive for nausea and vomiting. Negative for abdominal pain, diarrhea and constipation.  Genitourinary: Negative  for dysuria.  Musculoskeletal: Negative for myalgias.  Neurological: Negative for dizziness and headaches.   Blood pressure 167/76, pulse 77, temperature 97.8 F (36.6 C), temperature source Oral, resp. rate 15, SpO2 100.00%.  Constitutional: He is oriented to person, place, and time.  HENT: Head: Normocephalic and atraumatic. Brown eyes, conjunctivae are normal.   Neck: supple. No JVD present. No tracheal deviation present. No thyromegaly present.  Cardiovascular: Normal rate and regular rhythm. II/VI systolic murmur.  Respiratory: Breath sounds normal. No respiratory distress. He has no wheezes. He has no rales.  GI: Soft. Bowel sounds are normal. He exhibits no distension. There is no tenderness.  Musculoskeletal: He exhibits no edema and no tenderness.  Neurological: He is alert and oriented to person, place, and time. Moves all 4 extremities.  Assessment/Plan Chest pain Recent acute non-ST elevation MI  Coronary artery disease  Status post PCI to RCA - Proximal in 02/2013 and mid vessel in 09/2011  Hypertension  Hypercholesteremia  History of CVA  History of chronic pancreatitis  And alcoholic gastritis  History of EtOH abuse  Tobacco abuse  Degenerative joint disease  Place in observation Nuclear stress test v/s repeat cardiac cath if enzymes are abnormal.   Kourtney Terriquez S 03/19/2013, 10:03 PM

## 2013-03-20 ENCOUNTER — Encounter (HOSPITAL_COMMUNITY): Payer: Self-pay | Admitting: *Deleted

## 2013-03-20 LAB — LIPID PANEL
Cholesterol: 134 mg/dL (ref 0–200)
HDL: 49 mg/dL (ref >39)
LDL CALC: 75 mg/dL (ref 0–99)
TRIGLYCERIDES: 51 mg/dL (ref <150)
Total CHOL/HDL Ratio: 2.7 RATIO
VLDL: 10 mg/dL (ref 0–40)

## 2013-03-20 LAB — CBC
HEMATOCRIT: 34.4 % — AB (ref 39.0–52.0)
Hemoglobin: 11.4 g/dL — ABNORMAL LOW (ref 13.0–17.0)
MCH: 28.2 pg (ref 26.0–34.0)
MCHC: 33.1 g/dL (ref 30.0–36.0)
MCV: 85.1 fL (ref 78.0–100.0)
PLATELETS: 270 10*3/uL (ref 150–400)
RBC: 4.04 MIL/uL — AB (ref 4.22–5.81)
RDW: 14.5 % (ref 11.5–15.5)
WBC: 16.6 10*3/uL — AB (ref 4.0–10.5)

## 2013-03-20 LAB — TROPONIN I
Troponin I: <0.3 ng/mL (ref <0.30)
Troponin I: <0.3 ng/mL (ref <0.30)

## 2013-03-20 LAB — BASIC METABOLIC PANEL
BUN: 13 mg/dL (ref 6–23)
CHLORIDE: 103 meq/L (ref 96–112)
CO2: 26 mEq/L (ref 19–32)
Calcium: 8.3 mg/dL — ABNORMAL LOW (ref 8.4–10.5)
Creatinine, Ser: 0.79 mg/dL (ref 0.50–1.35)
GFR calc non Af Amer: >90 mL/min (ref >90)
Glucose, Bld: 97 mg/dL (ref 70–99)
POTASSIUM: 3.6 meq/L — AB (ref 3.7–5.3)
Sodium: 143 mEq/L (ref 137–147)

## 2013-03-20 LAB — HEMOGLOBIN A1C
Hgb A1c MFr Bld: 6.4 % — ABNORMAL HIGH (ref <5.7)
Mean Plasma Glucose: 137 mg/dL — ABNORMAL HIGH (ref <117)

## 2013-03-20 LAB — GLUCOSE, CAPILLARY: Glucose-Capillary: 81 mg/dL (ref 70–99)

## 2013-03-20 LAB — PROTIME-INR
INR: 1.1 (ref 0.00–1.49)
PROTHROMBIN TIME: 14 s (ref 11.6–15.2)

## 2013-03-20 LAB — HEPARIN LEVEL (UNFRACTIONATED)
HEPARIN UNFRACTIONATED: 0.28 [IU]/mL — AB (ref 0.30–0.70)
Heparin Unfractionated: 0.4 IU/mL (ref 0.30–0.70)

## 2013-03-20 MED ORDER — HEPARIN BOLUS VIA INFUSION
3000.0000 [IU] | Freq: Once | INTRAVENOUS | Status: AC
  Administered 2013-03-20: 3000 [IU] via INTRAVENOUS
  Filled 2013-03-20: qty 3000

## 2013-03-20 MED ORDER — HEPARIN (PORCINE) IN NACL 100-0.45 UNIT/ML-% IJ SOLN
1000.0000 [IU]/h | INTRAMUSCULAR | Status: DC
  Administered 2013-03-20: 1000 [IU]/h via INTRAVENOUS
  Filled 2013-03-20: qty 250

## 2013-03-20 NOTE — Progress Notes (Signed)
Spoke with patient in depth concerning stress test, he is still requesting that it not be done.  Patient states he thinks he is fine to go home.  Dr. Terrence Dupont paged and notified, new orders received that patient can eat.  Will continue to monitor.  Sanda Linger

## 2013-03-20 NOTE — Discharge Instructions (Signed)
Chest Pain (Nonspecific) °It is often hard to give a specific diagnosis for the cause of chest pain. There is always a chance that your pain could be related to something serious, such as a heart attack or a blood clot in the lungs. You need to follow up with your caregiver for further evaluation. °CAUSES  °· Heartburn. °· Pneumonia or bronchitis. °· Anxiety or stress. °· Inflammation around your heart (pericarditis) or lung (pleuritis or pleurisy). °· A blood clot in the lung. °· A collapsed lung (pneumothorax). It can develop suddenly on its own (spontaneous pneumothorax) or from injury (trauma) to the chest. °· Shingles infection (herpes zoster virus). °The chest wall is composed of bones, muscles, and cartilage. Any of these can be the source of the pain. °· The bones can be bruised by injury. °· The muscles or cartilage can be strained by coughing or overwork. °· The cartilage can be affected by inflammation and become sore (costochondritis). °DIAGNOSIS  °Lab tests or other studies, such as X-rays, electrocardiography, stress testing, or cardiac imaging, may be needed to find the cause of your pain.  °TREATMENT  °· Treatment depends on what may be causing your chest pain. Treatment may include: °· Acid blockers for heartburn. °· Anti-inflammatory medicine. °· Pain medicine for inflammatory conditions. °· Antibiotics if an infection is present. °· You may be advised to change lifestyle habits. This includes stopping smoking and avoiding alcohol, caffeine, and chocolate. °· You may be advised to keep your head raised (elevated) when sleeping. This reduces the chance of acid going backward from your stomach into your esophagus. °· Most of the time, nonspecific chest pain will improve within 2 to 3 days with rest and mild pain medicine. °HOME CARE INSTRUCTIONS  °· If antibiotics were prescribed, take your antibiotics as directed. Finish them even if you start to feel better. °· For the next few days, avoid physical  activities that bring on chest pain. Continue physical activities as directed. °· Do not smoke. °· Avoid drinking alcohol. °· Only take over-the-counter or prescription medicine for pain, discomfort, or fever as directed by your caregiver. °· Follow your caregiver's suggestions for further testing if your chest pain does not go away. °· Keep any follow-up appointments you made. If you do not go to an appointment, you could develop lasting (chronic) problems with pain. If there is any problem keeping an appointment, you must call to reschedule. °SEEK MEDICAL CARE IF:  °· You think you are having problems from the medicine you are taking. Read your medicine instructions carefully. °· Your chest pain does not go away, even after treatment. °· You develop a rash with blisters on your chest. °SEEK IMMEDIATE MEDICAL CARE IF:  °· You have increased chest pain or pain that spreads to your arm, neck, jaw, back, or abdomen. °· You develop shortness of breath, an increasing cough, or you are coughing up blood. °· You have severe back or abdominal pain, feel nauseous, or vomit. °· You develop severe weakness, fainting, or chills. °· You have a fever. °THIS IS AN EMERGENCY. Do not wait to see if the pain will go away. Get medical help at once. Call your local emergency services (911 in [place] Do not drive yourself to the hospital. °MAKE SURE YOU:  °· Understand these instructions. °· Will watch your condition. °· Will get help right away if you are not doing well or get worse. °Document Released: 12/10/2004 Document Revised: 05/25/2011 Document Reviewed: 10/06/2007 °Exit[employer]® Patient Information ©2014 [employer]   [employer]. ° °

## 2013-03-20 NOTE — Progress Notes (Signed)
Patient anxious to go home and does not want to wait on MD to come speak with him; he states he is ready to go home and ready to eat.  Explained to patient I had just ordered his diet, we have given him coffee per his request and the physician is on his way up.   Patient adamant he is ready to leave.  He removed telemetry box on his own, removed his own IV.  Explained to patient this is against medical advice, AMA form signed.  Patient walked out.  Sanda Linger

## 2013-03-20 NOTE — Progress Notes (Signed)
Received order for myoview and explained to patient. At this time, he is unwilling to proceed with investigative nuclear medicine. He states that if he is not having another acute heart attack, "just send me home."  Stable since admission just before midnight. No active complaints of chest pain or other associative symptoms.  VS this AM:  T 98 *F  P 70, normal sinus rhythm with PACs per EKG, known inferior t-wave abnormality.  R 18  BP 145/73  SPO2 97% on room air  Paged Dr. Doylene Canard, on-call for Dr. Terrence Dupont to make aware of patient desire for early discharge.  Awaiting return call.

## 2013-03-20 NOTE — Progress Notes (Signed)
ANTICOAGULATION CONSULT NOTE - Initial Consult  Pharmacy Consult for heparin Indication: CP 2wk after NSTEMI  Allergies  Allergen Reactions  . Morphine And Related Itching    Patient Measurements: Height: 5\' 11"  (180.3 cm) Weight: 151 lb 12.8 oz (68.856 kg) IBW/kg (Calculated) : 75.3  Vital Signs: Temp: 98 F (36.7 C) (01/04 2349) Temp src: Oral (01/04 2349) BP: 161/84 mmHg (01/04 2349) Pulse Rate: 62 (01/04 2349)  Labs:  Recent Labs  03/19/13 1952 03/19/13 2251  HGB 12.7*  --   HCT 37.8*  --   PLT 311  --   CREATININE 1.09  --   TROPONINI  --  <0.30    Estimated Creatinine Clearance: 79 ml/min (by C-G formula based on Cr of 1.09).   Medical History: Past Medical History  Diagnosis Date  . Hypertension   . Stroke   . Hyperlipidemia   . Depression   . Chronic pancreatitis   . Substance abuse   . Seizures   . Myocardial infarction   . Coronary artery disease     Medications:  Prescriptions prior to admission  Medication Sig Dispense Refill  . aspirin EC 81 MG EC tablet Take 1 tablet (81 mg total) by mouth daily.  30 tablet  3  . atorvastatin (LIPITOR) 80 MG tablet Take 80 mg by mouth daily.      . famotidine (PEPCID) 20 MG tablet Take 1 tablet (20 mg total) by mouth 2 (two) times daily.  60 tablet  0  . metoprolol tartrate (LOPRESSOR) 25 MG tablet Take 12.5 mg by mouth 2 (two) times daily.      . nitroGLYCERIN (NITROSTAT) 0.4 MG SL tablet Place 1 tablet (0.4 mg total) under the tongue every 5 (five) minutes x 3 doses as needed for chest pain.  25 tablet  12  . predniSONE (DELTASONE) 10 MG tablet Take 10 mg by mouth See admin instructions. 12 day tapered course filled 03/13/13      . Ticagrelor (BRILINTA) 90 MG TABS tablet Take 1 tablet (90 mg total) by mouth 2 (two) times daily.  60 tablet  11  . amLODipine (NORVASC) 10 MG tablet 10 mg.      . cetirizine (ZYRTEC) 10 MG tablet Take 1 tablet (10 mg total) by mouth daily.  30 tablet  0  . folic acid  (FOLVITE) 1 MG tablet 1 mg.      . gabapentin (NEURONTIN) 100 MG capsule Take 1 tab (100mg ) TID for 1 week, then 2 tabs (200mg ) TID for 1 week, then increase to 3 tabs (300mg ) TID after that      . pantoprazole (PROTONIX) 40 MG tablet 40 mg.      . thiamine 100 MG tablet 100 mg.       Scheduled:  . aspirin  324 mg Oral NOW   Or  . aspirin  300 mg Rectal NOW  . aspirin EC  81 mg Oral Daily  . atorvastatin  80 mg Oral Daily  . famotidine  20 mg Oral BID  . folic acid  1 mg Oral Daily  . gabapentin  200 mg Oral TID  . insulin aspart  0-9 Units Subcutaneous TID WC  . loratadine  10 mg Oral Daily  . metoprolol tartrate  12.5 mg Oral BID  . pantoprazole  40 mg Oral Daily  . predniSONE  10 mg Oral Daily  . thiamine  100 mg Oral Daily  . Ticagrelor  90 mg Oral BID    Assessment: 52yo  male was stented 2wk ago after NQWMI, now c/o sharp retrosternal CP improved w/ NTG x3, to begin heparin.  Goal of Therapy:  Heparin level 0.3-0.7 units/ml Monitor platelets by anticoagulation protocol: Yes   Plan:  Will give heparin 3000 units IV bolus x1 followed by gtt at 1000 units/hr (was previously undetectable when started at 900 units/hr but was not continued on heparin for further lab results) and monitor heparin levels and CBC.  Wynona Neat, PharmD, BCPS  03/20/2013,12:00 AM

## 2013-03-20 NOTE — Progress Notes (Signed)
Dr. Doylene Canard came to floor to speak to patient about testing and had convinced him to undergo treadmill testing with lexiscan myoview. Returned to room to discuss with patient. He is again refusing testing; seems very determined to push for discharge.  Not currently seeking to leave AMA.  Patient has financial difficulties related to recent hospitalizations and he does not believe that this test is necessary. He prefers to "go home and rest all day."  Paged MD to make aware.  Continuing to monitor.

## 2013-03-20 NOTE — Discharge Summary (Signed)
  Discharge summary dictated on 03/20/2013 dictation number is [phone]

## 2013-03-21 NOTE — Discharge Summary (Signed)
[name], [name]               ACCOUNT NO.:  [id number]  MEDICAL RECORD NO.:  [id number]  LOCATION:  3W34C                        FACILITY:  [facility]  PHYSICIAN:  Karista Aispuro N. Terrence Dupont, M.D. DATE OF BIRTH:  Dec 12, 1960  DATE OF ADMISSION:  03/19/2013 DATE OF DISCHARGE:  03/20/2013                              DISCHARGE SUMMARY   DATE OF DISCHARGE AGAINST MEDICAL ADVICE:  03/20/2013  ADMITTING DIAGNOSES: 1. Chest pain, rule out myocardial infarction.  Recent non-Q-wave     myocardial infarction. 2. Coronary artery disease. 3. Status post percutaneous coronary intervention to right coronary     artery in December 2014 and also percutaneous coronary intervention     to mid right coronary artery in July 2013. 4. Hypertension. 5. Hypercholesteremia. 6. History of cerebrovascular accident. 7. History of pancreatitis. 8. History of alcohol abuse. 9. History of tobacco abuse. 32.[name] of alcoholic gastritis. 11.Degenerative joint disease.  FINAL DIAGNOSES: 1. Status post chest pain, myocardial infarction ruled out by serial     enzymes. 2. Status post recent non-Q-wave myocardial infarction, status post     PCI to proximal RCA in December 2014, also had non-Q-wave     myocardial infarction in July 2013, requiring PTCA stenting to mid     RCA. 3. Hypertension. 4. Hypercholesteremia. 5. History of cerebrovascular accident. 6. History of pancreatitis. 7. History of alcoholic gastritis. 8. History of alcohol and tobacco abuse. 9. Degenerative joint disease.  DISCHARGE HOME MEDICATIONS:  The patient has been advised to continue his home medications, i.e. amlodipine 10 mg 1 tablet daily, aspirin 81 mg 1 tablet daily, atorvastatin 80 mg 1 tablet daily, Zyrtec 10 mg as needed, Pepcid 20 mg twice daily, gabapentin 300 mg 3 times daily as before, metoprolol tartrate 25 mg twice daily, Nitrostat 0.4 mg sublingually as directed, prednisone 10 mg daily taper as before, Brilinta 90 mg 1  tablet twice daily.  DIET:  Low salt, low cholesterol.  ACTIVITY:  As tolerated.  FOLLOWUP:  Follow up with me in 1 week.  CONDITION AT DISCHARGE:  Stable.  The patient signed out against medical advice.  Refused for any further evaluation and workup.  BRIEF HISTORY AND HOSPITAL COURSE:  Mr.  [name] is a 53 year old male with past medical history significant for coronary artery disease, history of non-Q-wave myocardial infarction x2, status post percutaneous coronary intervention to proximal and mid junction of RCA last month, had PTCA stenting to mid RCA in July 2013, hypertension, hypercholesteremia, history of pancreatitis, history of tobacco abuse, alcohol abuse, was admitted by Dr. Doylene Canard because of retrosternal sharp chest pain.  Chest pain improved after taking 3 sublingual nitro.  Chest pain was associated with nausea and diaphoresis.  No fever, chills, cough.  PHYSICAL EXAMINATION:  GENERAL:  He was alert, awake, oriented x3. VITAL SIGNS:  Blood pressure was 167/76, pulse was 77.  He was afebrile. HEENT:  Conjunctivae pink. NECK:  Supple.  No JVD.  No bruit. LUNGS:  Clear to auscultation without rhonchi or rales. CARDIOVASCULAR:  S1, S2 were normal.  There was 2/6 systolic murmur. ABDOMEN:  Soft.  Bowel sounds were present.  Nontender. EXTREMITIES:  There is no clubbing, cyanosis, or edema.  LABORATORY  DATA:  Sodium was 143, potassium 3.6, BUN 13, creatinine 0.79, glucose fasting was 97.  His 3 sets of troponin I were negative. Hemoglobin was 11.4, hematocrit 34.4, white count was 16.6, hemoglobin A1c was 6.4.  His EKG showed normal sinus rhythm with Q-wave in inferior leads.  There were no new acute ischemic changes.  BRIEF HOSPITAL COURSE:  The patient was admitted to telemetry unit.  MI was ruled out by serial enzymes and EKG.  The patient did not have any further episodes of chest pain during the hospital stay.  The patient initially was scheduled for nuclear  stress test, but refused for further testing and wanted to leave.  The patient removed his telemetry box on his own, removed his IV and signed out against medical advice.  The patient was advised to continue his home medications, and will follow up with me in 1 week.     Allegra Lai. Terrence Dupont, M.D.     MNH/MEDQ  D:  03/20/2013  T:  03/21/2013  Job:  [id number]

## 2014-02-22 ENCOUNTER — Encounter (HOSPITAL_COMMUNITY): Payer: Self-pay | Admitting: Cardiology

## 2014-06-15 ENCOUNTER — Emergency Department (HOSPITAL_COMMUNITY)
Admission: EM | Admit: 2014-06-15 | Discharge: 2014-06-16 | Disposition: A | Discharge status: Home / Self Care | Payer: Medicare Other | Attending: Emergency Medicine | Admitting: Emergency Medicine

## 2014-06-15 ENCOUNTER — Emergency Department (HOSPITAL_COMMUNITY): Payer: Medicare Other

## 2014-06-15 ENCOUNTER — Encounter (HOSPITAL_COMMUNITY): Payer: Self-pay | Admitting: Emergency Medicine

## 2014-06-15 DIAGNOSIS — Z8673 Personal history of transient ischemic attack (TIA), and cerebral infarction without residual deficits: Secondary | ICD-10-CM | POA: Diagnosis not present

## 2014-06-15 DIAGNOSIS — R45850 Homicidal ideations: Secondary | ICD-10-CM | POA: Diagnosis not present

## 2014-06-15 DIAGNOSIS — Z8719 Personal history of other diseases of the digestive system: Secondary | ICD-10-CM | POA: Diagnosis not present

## 2014-06-15 DIAGNOSIS — F329 Major depressive disorder, single episode, unspecified: Secondary | ICD-10-CM | POA: Insufficient documentation

## 2014-06-15 DIAGNOSIS — R509 Fever, unspecified: Secondary | ICD-10-CM | POA: Diagnosis not present

## 2014-06-15 DIAGNOSIS — R079 Chest pain, unspecified: Secondary | ICD-10-CM | POA: Insufficient documentation

## 2014-06-15 DIAGNOSIS — I252 Old myocardial infarction: Secondary | ICD-10-CM | POA: Insufficient documentation

## 2014-06-15 DIAGNOSIS — Z720 Tobacco use: Secondary | ICD-10-CM | POA: Insufficient documentation

## 2014-06-15 DIAGNOSIS — F32A Depression, unspecified: Secondary | ICD-10-CM

## 2014-06-15 DIAGNOSIS — M5417 Radiculopathy, lumbosacral region: Secondary | ICD-10-CM | POA: Insufficient documentation

## 2014-06-15 DIAGNOSIS — I10 Essential (primary) hypertension: Secondary | ICD-10-CM | POA: Insufficient documentation

## 2014-06-15 DIAGNOSIS — R059 Cough, unspecified: Secondary | ICD-10-CM

## 2014-06-15 DIAGNOSIS — Z7982 Long term (current) use of aspirin: Secondary | ICD-10-CM | POA: Insufficient documentation

## 2014-06-15 DIAGNOSIS — I2510 Atherosclerotic heart disease of native coronary artery without angina pectoris: Secondary | ICD-10-CM | POA: Diagnosis not present

## 2014-06-15 DIAGNOSIS — R45851 Suicidal ideations: Secondary | ICD-10-CM | POA: Diagnosis not present

## 2014-06-15 DIAGNOSIS — M545 Low back pain: Secondary | ICD-10-CM | POA: Diagnosis present

## 2014-06-15 DIAGNOSIS — R05 Cough: Secondary | ICD-10-CM

## 2014-06-15 DIAGNOSIS — Z79899 Other long term (current) drug therapy: Secondary | ICD-10-CM | POA: Diagnosis not present

## 2014-06-15 DIAGNOSIS — M549 Dorsalgia, unspecified: Secondary | ICD-10-CM | POA: Diagnosis not present

## 2014-06-15 LAB — CBC WITH DIFFERENTIAL/PLATELET
Basophils Absolute: 0 10*3/uL (ref 0.0–0.1)
Basophils Relative: 0 % (ref 0–1)
EOS ABS: 0.2 10*3/uL (ref 0.0–0.7)
Eosinophils Relative: 3 % (ref 0–5)
HCT: 39.5 % (ref 39.0–52.0)
HEMOGLOBIN: 13.2 g/dL (ref 13.0–17.0)
Lymphocytes Relative: 15 % (ref 12–46)
Lymphs Abs: 0.9 10*3/uL (ref 0.7–4.0)
MCH: 28.4 pg (ref 26.0–34.0)
MCHC: 33.4 g/dL (ref 30.0–36.0)
MCV: 84.9 fL (ref 78.0–100.0)
MONOS PCT: 5 % (ref 3–12)
Monocytes Absolute: 0.3 10*3/uL (ref 0.1–1.0)
NEUTROS ABS: 4.9 10*3/uL (ref 1.7–7.7)
NEUTROS PCT: 77 % (ref 43–77)
Platelets: 162 10*3/uL (ref 150–400)
RBC: 4.65 MIL/uL (ref 4.22–5.81)
RDW: 14.4 % (ref 11.5–15.5)
WBC: 6.3 10*3/uL (ref 4.0–10.5)

## 2014-06-15 LAB — COMPREHENSIVE METABOLIC PANEL
ALT: 15 U/L (ref 0–53)
AST: 26 U/L (ref 0–37)
Albumin: 4 g/dL (ref 3.5–5.2)
Alkaline Phosphatase: 86 U/L (ref 39–117)
Anion gap: 10 (ref 5–15)
BUN: 10 mg/dL (ref 6–23)
CALCIUM: 8.6 mg/dL (ref 8.4–10.5)
CO2: 24 mmol/L (ref 19–32)
Chloride: 103 mmol/L (ref 96–112)
Creatinine, Ser: 1.09 mg/dL (ref 0.50–1.35)
GFR calc Af Amer: 89 mL/min — ABNORMAL LOW (ref >90)
GFR calc non Af Amer: 77 mL/min — ABNORMAL LOW (ref >90)
Glucose, Bld: 115 mg/dL — ABNORMAL HIGH (ref 70–99)
Potassium: 3.7 mmol/L (ref 3.5–5.1)
Sodium: 137 mmol/L (ref 135–145)
TOTAL PROTEIN: 7 g/dL (ref 6.0–8.3)
Total Bilirubin: 0.6 mg/dL (ref 0.3–1.2)

## 2014-06-15 LAB — URINALYSIS, ROUTINE W REFLEX MICROSCOPIC
Bilirubin Urine: NEGATIVE
GLUCOSE, UA: NEGATIVE mg/dL
Ketones, ur: 15 mg/dL — AB
LEUKOCYTES UA: NEGATIVE
Nitrite: NEGATIVE
PH: 6.5 (ref 5.0–8.0)
Protein, ur: NEGATIVE mg/dL
SPECIFIC GRAVITY, URINE: 1.024 (ref 1.005–1.030)
Urobilinogen, UA: 1 mg/dL (ref 0.0–1.0)

## 2014-06-15 LAB — I-STAT CG4 LACTIC ACID, ED: LACTIC ACID, VENOUS: 1.48 mmol/L (ref 0.5–2.0)

## 2014-06-15 LAB — URINE MICROSCOPIC-ADD ON

## 2014-06-15 LAB — I-STAT TROPONIN, ED: TROPONIN I, POC: 0.02 ng/mL (ref 0.00–0.08)

## 2014-06-15 MED ORDER — SODIUM CHLORIDE 0.9 % IV BOLUS (SEPSIS)
1000.0000 mL | Freq: Once | INTRAVENOUS | Status: AC
  Administered 2014-06-15: 1000 mL via INTRAVENOUS

## 2014-06-15 MED ORDER — GUAIFENESIN 100 MG/5ML PO SOLN
10.0000 mL | Freq: Once | ORAL | Status: AC
  Administered 2014-06-15: 200 mg via ORAL
  Filled 2014-06-15 (×3): qty 10

## 2014-06-15 MED ORDER — METOPROLOL TARTRATE 25 MG PO TABS
25.0000 mg | ORAL_TABLET | Freq: Two times a day (BID) | ORAL | Status: DC
  Administered 2014-06-15 (×2): 25 mg via ORAL
  Filled 2014-06-15 (×3): qty 1

## 2014-06-15 MED ORDER — KETOROLAC TROMETHAMINE 30 MG/ML IJ SOLN
30.0000 mg | Freq: Once | INTRAMUSCULAR | Status: AC
  Administered 2014-06-15: 30 mg via INTRAVENOUS
  Filled 2014-06-15: qty 1

## 2014-06-15 MED ORDER — FAMOTIDINE 20 MG PO TABS
20.0000 mg | ORAL_TABLET | Freq: Two times a day (BID) | ORAL | Status: DC | PRN
  Administered 2014-06-15: 20 mg via ORAL
  Filled 2014-06-15: qty 1

## 2014-06-15 MED ORDER — IBUPROFEN 400 MG PO TABS
400.0000 mg | ORAL_TABLET | Freq: Once | ORAL | Status: AC
  Administered 2014-06-15: 400 mg via ORAL
  Filled 2014-06-15: qty 1

## 2014-06-15 MED ORDER — TRAMADOL HCL 50 MG PO TABS
50.0000 mg | ORAL_TABLET | Freq: Four times a day (QID) | ORAL | Status: DC | PRN
  Administered 2014-06-15 – 2014-06-16 (×3): 50 mg via ORAL
  Filled 2014-06-15 (×3): qty 1

## 2014-06-15 MED ORDER — AMLODIPINE BESYLATE 5 MG PO TABS
10.0000 mg | ORAL_TABLET | Freq: Every day | ORAL | Status: DC
  Administered 2014-06-15: 10 mg via ORAL
  Filled 2014-06-15 (×2): qty 2

## 2014-06-15 MED ORDER — GABAPENTIN 300 MG PO CAPS
300.0000 mg | ORAL_CAPSULE | Freq: Three times a day (TID) | ORAL | Status: DC
  Administered 2014-06-15 (×3): 300 mg via ORAL
  Filled 2014-06-15 (×4): qty 1

## 2014-06-15 MED ORDER — ATORVASTATIN CALCIUM 80 MG PO TABS
80.0000 mg | ORAL_TABLET | Freq: Every day | ORAL | Status: DC
  Administered 2014-06-15: 80 mg via ORAL
  Filled 2014-06-15 (×2): qty 1

## 2014-06-15 MED ORDER — ASPIRIN EC 81 MG PO TBEC
81.0000 mg | DELAYED_RELEASE_TABLET | Freq: Every day | ORAL | Status: DC
  Administered 2014-06-15: 81 mg via ORAL
  Filled 2014-06-15 (×2): qty 1

## 2014-06-15 MED ORDER — GADOBENATE DIMEGLUMINE 529 MG/ML IV SOLN
15.0000 mL | Freq: Once | INTRAVENOUS | Status: AC | PRN
  Administered 2014-06-15: 15 mL via INTRAVENOUS

## 2014-06-15 MED ORDER — TICAGRELOR 90 MG PO TABS
90.0000 mg | ORAL_TABLET | Freq: Two times a day (BID) | ORAL | Status: DC
  Administered 2014-06-15 (×2): 90 mg via ORAL
  Filled 2014-06-15 (×7): qty 1

## 2014-06-15 NOTE — ED Notes (Signed)
Pt appears angry cussing and threatening MD per sitter.  Pt is being wanded by security.

## 2014-06-15 NOTE — ED Notes (Signed)
Pt placed in paper scrubs, belongings given to POD C RN, sitter at bedside, security notified for wanding of pt

## 2014-06-15 NOTE — ED Notes (Signed)
Pt cursing at sitter about staff.

## 2014-06-15 NOTE — BH Assessment (Addendum)
Tele Assessment Note   [name] is an 55 y.o. male. The Pt arrived to [facility] reporting severe back pain. Pt reported SI to the EDP. Pt currently denies SI. Pt states that severe back pain caused him to inform the EDP that he was SI. Pt confirms HI. Pt states that he wants to kill the man his girlfriend is having an affair with. Pt has access to weapons. Pt admitted to 1 previous SI attempt 10 years ago. Pt denied previous inpatient and outpatient treatment. Medical history reports previous inpatient treatment in 2012. Pt denied medication use. Pt denied SA and alcohol use. Pt denied a previous diagnosis. According to the Pt, his severe back pain began when he sneezed yesterday. Pt reports that since yesterday his back has been hurting. Pt states that his physician informed him that scare tissue is causing his back to ache.   Writer consulted with Heloise Purpura, NP. Per Heloise Purpura Pt should remain in obs status at [facility] because of inconsistency in SI statements. Pt pending am psych evaluation. Per Heloise Purpura possible D/C in the am.  Axis I: Depressive Disorder NOS Axis II: Deferred Axis III:  Past Medical History  Diagnosis Date  . Hypertension   . Stroke   . Hyperlipidemia   . Depression   . Chronic pancreatitis   . Substance abuse   . Seizures   . Myocardial infarction   . Coronary artery disease    Axis IV: other psychosocial or environmental problems and problems with primary support group Axis V: 31-40 impairment in reality testing  Past Medical History:  Past Medical History  Diagnosis Date  . Hypertension   . Stroke   . Hyperlipidemia   . Depression   . Chronic pancreatitis   . Substance abuse   . Seizures   . Myocardial infarction   . Coronary artery disease     Past Surgical History  Procedure Laterality Date  . Back surgery    . Coronary stent placement    . Left heart catheterization with coronary angiogram N/A 10/01/2011    Procedure: LEFT HEART CATHETERIZATION WITH  CORONARY ANGIOGRAM;  Surgeon: Clent Demark, MD;  Location: [facility] CATH LAB;  Service: Cardiovascular;  Laterality: N/A;  . Percutaneous coronary stent intervention (pci-s) N/A 10/02/2011    Procedure: PERCUTANEOUS CORONARY STENT INTERVENTION (PCI-S);  Surgeon: Clent Demark, MD;  Location: [facility] CATH LAB;  Service: Cardiovascular;  Laterality: N/A;  . Left heart catheterization with coronary angiogram N/A 03/06/2013    Procedure: LEFT HEART CATHETERIZATION WITH CORONARY ANGIOGRAM;  Surgeon: Clent Demark, MD;  Location: [facility] CATH LAB;  Service: Cardiovascular;  Laterality: N/A;    Family History: History reviewed. No pertinent family history.  Social History:  reports that he has been smoking Cigarettes.  He has a 11.55 pack-year smoking history. He has never used smokeless tobacco. He reports that he drinks alcohol. He reports that he does not use illicit drugs.  Additional Social History:  Alcohol / Drug Use Pain Medications: Pt denies Prescriptions: Pt denies Over the Counter: Pt denies History of alcohol / drug use?: No history of alcohol / drug abuse Longest period of sobriety (when/how long): NA  CIWA: CIWA-Ar BP: 130/64 mmHg Pulse Rate: 77 COWS:    PATIENT STRENGTHS: (choose at least two) Average or above average intelligence Communication skills  Allergies:  Allergies  Allergen Reactions  . Morphine And Related Itching    Home Medications:  (Not in a hospital admission)  OB/GYN Status:  No LMP  for male patient.  General Assessment Data Location of Assessment: [facility] ED Is this a Tele or Face-to-Face Assessment?: Tele Assessment Is this an Initial Assessment or a Re-assessment for this encounter?: Initial Assessment Living Arrangements: Other relatives Can pt return to current living arrangement?: Yes Admission Status: Voluntary Is patient capable of signing voluntary admission?: Yes Transfer from: Home Referral Source: Self/Family/Friend     [place] Living Arrangements: Other relatives Name of Psychiatrist: NA Name of Therapist: NA  Education Status Is patient currently in school?: No Current Grade: NA Highest grade of school patient has completed: 12 Name of school: NA Contact person: NA  Risk to self with the past 6 months Suicidal Ideation: No Suicidal Intent: No Is patient at risk for suicide?: No Suicidal Plan?: No Access to Means: No What has been your use of drugs/alcohol within the last 12 months?: NA Previous Attempts/Gestures: Yes How many times?: 1 Other Self Harm Risks: NA Triggers for Past Attempts: None known Intentional Self Injurious Behavior: None Family Suicide History: No Recent stressful life event(s): Conflict (Comment) (with girlfriend) Persecutory voices/beliefs?: No Depression: Yes Depression Symptoms: Loss of interest in usual pleasures, Feeling worthless/self pity, Feeling angry/irritable Substance abuse history and/or treatment for substance abuse?: No Suicide prevention information given to non-admitted patients: Not applicable  Risk to Others within the past 6 months Homicidal Ideation: Yes-Currently Present Thoughts of Harm to Others: Yes-Currently Present Comment - Thoughts of Harm to Others: States wants to harm man his girlfriend is cheating with Current Homicidal Intent: Yes-Currently Present Current Homicidal Plan: No Access to Homicidal Means: Yes Describe Access to Homicidal Means: Access to weapons Identified Victim: The man his girlfriend is cheating with History of harm to others?: No Assessment of Violence: None Noted Violent Behavior Description: NA Does patient have access to weapons?: Yes (Comment) Criminal Charges Pending?: No Does patient have a court date: No  Psychosis Hallucinations: None noted Delusions: None noted  Mental Status Report Appearance/Hygiene: Unremarkable, In hospital gown Eye Contact: Good Motor Activity: Freedom of movement Speech:  Logical/coherent Level of Consciousness: Alert Mood: Depressed, Sad Affect: Depressed, Sad Anxiety Level: Minimal Thought Processes: Coherent, Relevant Judgement: Unimpaired Orientation: Person, Place, Time, Situation, Appropriate for developmental age Obsessive Compulsive Thoughts/Behaviors: None  Cognitive Functioning Concentration: Normal Memory: Recent Intact, Remote Intact IQ: Average Insight: Fair Impulse Control: Fair Appetite: Fair Weight Loss: 0 Weight Gain: 0 Sleep: Decreased Total Hours of Sleep: 5 Vegetative Symptoms: None  ADLScreening [facility]) Patient's cognitive ability adequate to safely complete daily activities?: Yes Patient able to express need for assistance with ADLs?: No Independently performs ADLs?: Yes (appropriate for developmental age)  Prior Inpatient Therapy Prior Inpatient Therapy: No Prior Therapy Dates: NA Prior Therapy Facilty/Provider(s): NA Reason for Treatment: NA  Prior Outpatient Therapy Prior Outpatient Therapy: No Prior Therapy Dates: NA Prior Therapy Facilty/Provider(s): NA Reason for Treatment: NA  ADL Screening (condition at time of admission) Patient's cognitive ability adequate to safely complete daily activities?: Yes Is the patient deaf or have difficulty hearing?: No Does the patient have difficulty seeing, even when wearing glasses/contacts?: No Does the patient have difficulty concentrating, remembering, or making decisions?: No Patient able to express need for assistance with ADLs?: No Does the patient have difficulty dressing or bathing?: Yes Independently performs ADLs?: Yes (appropriate for developmental age) Does the patient have difficulty walking or climbing stairs?: No       Abuse/Neglect Assessment (Assessment to be complete while patient is alone) Physical Abuse: Denies Verbal  Abuse: Denies Sexual Abuse: Denies Exploitation of patient/patient's resources: Denies Self-Neglect: Denies      Regulatory affairs officer (For Healthcare) Does patient have an advance directive?: No Would patient like information on creating an advanced directive?: No - patient declined information    Additional Information 1:1 In Past 12 Months?: No CIRT Risk: No Elopement Risk: No Does patient have medical clearance?: No     Disposition:  Disposition Initial Assessment Completed for this Encounter: Yes Disposition of Patient: Other dispositions (Pending disposition) Other disposition(s): Other (Comment) (Pending disposition)  Teondra Newburg D 06/15/2014 7:48 AM

## 2014-06-15 NOTE — ED Notes (Signed)
Pt visiting with family.

## 2014-06-15 NOTE — ED Provider Notes (Signed)
Pt alert, content, conversing w staff/family. Pt appears comfortable, nad.  Filed Vitals:   06/15/14 1240  BP: 150/76  Pulse: 82  Temp: 99.1 F (37.3 C)  Resp: 20    Discussed pt with psych team - awaiting placement.  pts home meds, and pain med ordered.     Lajean Saver, MD 06/15/14 [phone]

## 2014-06-15 NOTE — ED Notes (Signed)
TTS at bedside. 

## 2014-06-15 NOTE — ED Provider Notes (Signed)
CSN: [id number]     Arrival date & time 06/15/14  0230 History  This chart was scribed for Willie Rice, MD by [name], ED Scribe. This patient was seen in room D30C/D30C and the patient's care was started at 3:17 AM.    Chief Complaint  Patient presents with  . Back Pain  . Neck Pain  . Suicidal   The history is provided by the patient. No language interpreter was used.     HPI Comments: [name] is a 54 y.o. male with past medical history of HTN, HLD, stroke, chronic pancreatitis, MI, CAD, seizures, depression, substance abuse who presents to the Emergency Department complaining of 3 days of lower back pain, induced by cough (nonproductive). He reports 3 days of subjective fever with diaphoresis and chills. He also notes chest pain yesterday which was exacerbated with deep breathing. His back pain has begun to radiate down his legs, prompting him to come to the ED tonight; his pain increases with straight leg raise. He reports that he has taken Advil, aspirin, and SL NTG for his symptoms. He denies abdominal pain, nausea, vomiting, diarrhea, rashes.   Last back surgery two years PTA - "they put a disc in there." He states he does not have back pain at baseline. He denies IV drug use.   Past Medical History  Diagnosis Date  . Hypertension   . Stroke   . Hyperlipidemia   . Depression   . Chronic pancreatitis   . Substance abuse   . Seizures   . Myocardial infarction   . Coronary artery disease    Past Surgical History  Procedure Laterality Date  . Back surgery    . Coronary stent placement    . Left heart catheterization with coronary angiogram N/A 10/01/2011    Procedure: LEFT HEART CATHETERIZATION WITH CORONARY ANGIOGRAM;  Surgeon: Clent Demark, MD;  Location: [facility] CATH LAB;  Service: Cardiovascular;  Laterality: N/A;  . Percutaneous coronary stent intervention (pci-s) N/A 10/02/2011    Procedure: PERCUTANEOUS CORONARY STENT INTERVENTION (PCI-S);  Surgeon: Clent Demark, MD;  Location: [facility] CATH LAB;  Service: Cardiovascular;  Laterality: N/A;  . Left heart catheterization with coronary angiogram N/A 03/06/2013    Procedure: LEFT HEART CATHETERIZATION WITH CORONARY ANGIOGRAM;  Surgeon: Clent Demark, MD;  Location: [facility] CATH LAB;  Service: Cardiovascular;  Laterality: N/A;   History reviewed. No pertinent family history. History  Substance Use Topics  . Smoking status: Current Some Day Smoker -- 0.33 packs/day for 35 years    Types: Cigarettes  . Smokeless tobacco: Never Used     Comment: working on quitting  . Alcohol Use: Yes     Comment: beer every other day    Review of Systems  Constitutional: Positive for fever and chills.  HENT: Negative for congestion and sore throat.   Respiratory: Positive for cough. Negative for wheezing.   Cardiovascular: Positive for chest pain. Negative for palpitations and leg swelling.  Gastrointestinal: Negative for nausea, vomiting, abdominal pain and diarrhea.  Genitourinary: Negative for dysuria, frequency, hematuria and flank pain.  Musculoskeletal: Positive for back pain. Negative for neck pain and neck stiffness.  Skin: Negative for rash and wound.  Neurological: Negative for dizziness, weakness, light-headedness, numbness and headaches.  All other systems reviewed and are negative.     Allergies  Morphine and related  Home Medications   Prior to Admission medications   Medication Sig Start Date End Date Taking? Authorizing Provider  amLODipine (NORVASC)  10 MG tablet Take 10 mg by mouth daily.    Historical Provider, MD  aspirin EC 81 MG EC tablet Take 1 tablet (81 mg total) by mouth daily. 03/08/13   Charolette Forward, MD  atorvastatin (LIPITOR) 80 MG tablet Take 80 mg by mouth daily.    Historical Provider, MD  cetirizine (ZYRTEC) 10 MG tablet Take 1 tablet (10 mg total) by mouth daily. 03/13/13   Gregor Hams, MD  famotidine (PEPCID) 20 MG tablet Take 20 mg by mouth 2 (two) times daily as needed  for heartburn or indigestion.    Historical Provider, MD  gabapentin (NEURONTIN) 100 MG capsule Take 300 mg by mouth 3 (three) times daily.    Historical Provider, MD  metoprolol tartrate (LOPRESSOR) 25 MG tablet Take 25 mg by mouth 2 (two) times daily.    Historical Provider, MD  nitroGLYCERIN (NITROSTAT) 0.4 MG SL tablet Place 1 tablet (0.4 mg total) under the tongue every 5 (five) minutes x 3 doses as needed for chest pain. 03/08/13   Charolette Forward, MD  predniSONE (DELTASONE) 10 MG tablet Take 10 mg by mouth See admin instructions. 12 day tapered course filled 03/13/13    Historical Provider, MD  Ticagrelor (BRILINTA) 90 MG TABS tablet Take 1 tablet (90 mg total) by mouth 2 (two) times daily. 03/08/13   Charolette Forward, MD   BP 130/64 mmHg  Pulse 77  Temp(Src) 101.8 F (38.8 C) (Oral)  Resp 19  Ht 5\' 10"  (1.778 m)  Wt 160 lb (72.576 kg)  BMI 22.96 kg/m2  SpO2 96% Physical Exam  Constitutional: He is oriented to person, place, and time. He appears well-developed and well-nourished. No distress.  HENT:  Head: Normocephalic and atraumatic.  Mouth/Throat: Oropharynx is clear and moist. No oropharyngeal exudate.  No sinus tenderness with percussion  Eyes: EOM are normal. Pupils are equal, round, and reactive to light. Right eye exhibits no discharge. Left eye exhibits no discharge.  Neck: Normal range of motion. Neck supple.  No meningismus  Cardiovascular: Normal rate and regular rhythm.  Exam reveals no gallop and no friction rub.   No murmur heard. Pulmonary/Chest: Effort normal and breath sounds normal. No respiratory distress. He has no wheezes. He has no rales. He exhibits no tenderness.  Abdominal: Soft. Bowel sounds are normal. He exhibits no distension and no mass. There is no tenderness. There is no rebound and no guarding.  Musculoskeletal: Normal range of motion. He exhibits tenderness (bilateral paraspinal lumbar tenderness with palpation. No definite midline tenderness.). He  exhibits no edema.  Neurological: He is alert and oriented to person, place, and time.  5/5 motor in all extremities. Sensation is fully intact.  Skin: Skin is warm and dry. No rash noted. No erythema.  Psychiatric: He has a normal mood and affect. His behavior is normal.  Nursing note and vitals reviewed.   ED Course  Procedures   DIAGNOSTIC STUDIES: Oxygen Saturation is 97% on RA, adequate by my interpretation.    COORDINATION OF CARE: 3:24 AM Discussed treatment plan with pt at bedside and pt agreed to plan.   Labs Review Labs Reviewed  COMPREHENSIVE METABOLIC PANEL - Abnormal; Notable for the following:    Glucose, Bld 115 (*)    GFR calc non Af Amer 77 (*)    GFR calc Af Amer 89 (*)    All other components within normal limits  URINALYSIS, ROUTINE W REFLEX MICROSCOPIC - Abnormal; Notable for the following:    Hgb urine dipstick  MODERATE (*)    Ketones, ur 15 (*)    All other components within normal limits  CULTURE, BLOOD (ROUTINE X 2)  CULTURE, BLOOD (ROUTINE X 2)  CBC WITH DIFFERENTIAL/PLATELET  URINE MICROSCOPIC-ADD ON  I-STAT CG4 LACTIC ACID, ED  Randolm Idol, ED    Imaging Review Dg Chest 2 View  06/15/2014   CLINICAL DATA:  Cough and diaphoresis for 3 days with shortness of breath. Neck pain for 3 days. History of low back surgery.  EXAM: CHEST  2 VIEW  COMPARISON:  Chest radiograph March 19, 2013  FINDINGS: Cardiomediastinal silhouette is unremarkable. The lungs are clear without pleural effusions or focal consolidations. Trachea projects midline and there is no pneumothorax. Soft tissue planes and included osseous structures are non-suspicious.  IMPRESSION: Normal chest.   Electronically Signed   By: Elon Alas   On: 06/15/2014 04:03   Mr Lumbar Spine W Wo Contrast  06/15/2014   CLINICAL DATA:  Three day history of low back pain, worse with cough. History of chronic pancreatitis, substance abuse.  EXAM: MRI LUMBAR SPINE WITHOUT AND WITH CONTRAST   TECHNIQUE: Multiplanar and multiecho pulse sequences of the lumbar spine were obtained without and with intravenous contrast.  CONTRAST:  13mL MULTIHANCE GADOBENATE DIMEGLUMINE 529 MG/ML IV SOLN  COMPARISON:  CT of the abdomen and pelvis Jul 28, 2012  FINDINGS: Lumbar vertebral bodies intact and aligned, straightened lumbar lordosis. Using the reference level of the last well-formed intervertebral disc as L5-S1, minimal grade 1 L5-S1 retrolisthesis. Status post L4-5 PLIF, hardware results in some susceptibility artifact. Moderate L5-S1 disc height loss, decreased T2 signal within the L3-4 and L5-S1 disc consistent with mild desiccation. No suspicious osseous or intradiscal enhancement.  Conus medullaris terminates at T12-L1 and appears normal morphology and signal characteristics. Multiple T2 bright partially imaged in the pancreas, difficult to discretely identify from the stomach. Paraspinal soft tissues are nonsuspicious.  Level by level evaluation:  L1-2: No disc bulge, canal stenosis nor neural foraminal narrowing.  L2-3: Annular bulging, no canal stenosis or neural foraminal narrowing.  L3-4: 2 mm broad-based disc bulge asymmetric to the RIGHT, tiny RIGHT subarticular annular fissure. Moderate facet arthropathy and ligamentum flavum redundancy. Mild canal stenosis including partial effacement of the RIGHT lateral recess which may affect the traversing RIGHT L4 nerve. Mild bilateral neural foraminal narrowing.  L4-5: PLIF. Small to moderate LEFT disc osteophyte complexes seen on prior imaging. Mild canal stenosis. Suspected mild LEFT neural foraminal narrowing may be overestimated by hardware artifact.  L5-S1: 7 x 6 mm (AP by transverse) LEFT central disc extrusion posteriorly displaces the traversing LEFT S1 nerve. Small amount of hematoma about the extrusion consistent with more acute process. Moderate canal stenosis due to the extrusion. Mild facet arthropathy and ligamentum flavum redundancy. Mild LEFT  greater than RIGHT neural foraminal narrowing.  IMPRESSION: Moderate contiguous LEFT central L5-S1 disc extrusion displaces the traversing LEFT S1 nerve. Moderate canal stenosis at L5-S1.  Status post L4-5 PLIF.  Mild canal stenosis at L3-4 and L4-5. Mild L3-4 through L5-S1 neural foraminal narrowing.  Partially imaged pancreatic pseudocysts, consistent with provided history of chronic pancreatitis.   Electronically Signed   By: Elon Alas   On: 06/15/2014 05:49     EKG Interpretation None      MDM   Final diagnoses:  Cough  Fever    I personally performed the services described in this documentation, which was scribed in my presence. The recorded information has been reviewed and is accurate.  Patient presents with fever 2-3 days and exacerbation of his chronic lumbar pain. He also has a cough. No new neurologic findings. Chest x-ray without any evidence of pneumonia. Concern for possible spinal infection of the lumbar region. We'll get MRI spine.  MRI with left L5/S1 disc extrusion. No evidence of infection. Discuss results of MRI with radiologist. Patient has a normal white blood cell count. Chest x-ray without any acute findings. Patient does admit to nasal congestion and cough. He states the coughing exacerbated his lower back pain.  Patient states he's been having increased depressive thoughts over the past week. States there is "nothing left to live for". Denies any active suicidal plan. States he has tried to commit suicide in the past with overdose. Denies pain followed by any mental health professional. Patient is medically cleared and will have TTS consult.   Willie Rice, MD 06/23/14 [phone]

## 2014-06-15 NOTE — ED Notes (Signed)
Requested pain medication order from POD E MD.

## 2014-06-15 NOTE — ED Notes (Addendum)
Pt reports lower back pain and neck pain x 3 days induced by cough; pt reports hx of lower back surgery x 2 years ago; pt also reports diaphoresis x 3 days; pt denies falls or known injury to lower back

## 2014-06-15 NOTE — Progress Notes (Signed)
Per TTS, patient needs to remain overnight to be seen in the AM. If patient attempts to leave, patient will need to be committed. At this time, patient is not committed   Lane Hacker, MSW Clinical Social Work: Emergency Room [phone]

## 2014-06-15 NOTE — ED Notes (Signed)
This RN reviewed the pts Xray & MRI results with him, pt agitated & requesting pain medication for his back, pt states, "I am going to get into something bad because of that black bitch." pt cooperative at this time

## 2014-06-16 DIAGNOSIS — R45850 Homicidal ideations: Secondary | ICD-10-CM

## 2014-06-16 DIAGNOSIS — R45851 Suicidal ideations: Secondary | ICD-10-CM | POA: Diagnosis not present

## 2014-06-16 DIAGNOSIS — M549 Dorsalgia, unspecified: Secondary | ICD-10-CM

## 2014-06-16 MED ORDER — IBUPROFEN 400 MG PO TABS
600.0000 mg | ORAL_TABLET | Freq: Three times a day (TID) | ORAL | Status: DC | PRN
  Filled 2014-06-16 (×2): qty 1

## 2014-06-16 MED ORDER — ACETAMINOPHEN 325 MG PO TABS: 650.0000 mg | ORAL_TABLET | ORAL | Status: DC | PRN

## 2014-06-16 MED ORDER — ONDANSETRON HCL 4 MG PO TABS: 4.0000 mg | ORAL_TABLET | Freq: Three times a day (TID) | ORAL | Status: DC | PRN

## 2014-06-16 MED ORDER — LORAZEPAM 1 MG PO TABS: 1.0000 mg | ORAL_TABLET | Freq: Three times a day (TID) | ORAL | Status: DC | PRN

## 2014-06-16 MED ORDER — ALUM & MAG HYDROXIDE-SIMETH 200-200-20 MG/5ML PO SUSP: 30.0000 mL | ORAL | Status: DC | PRN

## 2014-06-16 NOTE — ED Notes (Signed)
Dr Doy Mince in w/pt.

## 2014-06-16 NOTE — ED Notes (Signed)
Offered to assist pt w/dressing - pt advised he was OK to do it himself. Pt then dressed self.

## 2014-06-16 NOTE — ED Notes (Signed)
PT'S SPOUSE VISITING W/PT. RN OVERHEARD SPOUSE TALKING LOUDLY TO STAFF (EMT AND SITTER) ASKING WHY PT WAS NOT AUTOMATICALLY GIVEN SUPPLIES SO HE MAY BATHE AND IS ASKING FOR SOMEONE TO ASSIST HIM IN SHOWER D/T C/O BACK PAIN. SPOUSE EATING PT'S BREAKFAST. PT NOTED TO BE CURSING. SECURITY STANDING BY - RN ASKED PT AND SPOUSE IF THEY HAD QUESTIONS. BOTH ADVISED NO THEN SPOUSE ASKED TO SPEAK W/RN PRIVATELY. PT GAVE VERBAL AUTHORIZATION FOR RN TO SPEAK W/SPOUSE AND GIVE ANY INFORMATION REQUESTED. RN ADVISED SPOUSE OF TX PLAN - NP, [place], TO SPEAK W/PT FOR POSSIBLE D/C. ALSO ADVISED HER OF [facility] C POLICIES. VOICED UNDERSTANDING.

## 2014-06-16 NOTE — ED Notes (Signed)
Telepsych completed.  

## 2014-06-16 NOTE — ED Notes (Signed)
PT REFUSED ALL 1000 HOME MEDS, IBUPROFEN OFFERED AND ICE BACK FOR C/O PAIN. STATES "I DON'T WANT NOTHING."

## 2014-06-16 NOTE — Discharge Instructions (Signed)
Suicidal Feelings, How to Help Yourself Everyone feels sad or unhappy at times, but depressing thoughts and feelings of hopelessness can lead to thoughts of suicide. It can seem as if life is too tough to handle. If you feel as though you have reached the point where suicide is the only answer, it is time to let someone know immediately.  HOW TO COPE AND PREVENT SUICIDE  Let family, friends, teachers, or counselors know. Get help. Try not to isolate yourself from those who care about you. Even though you may not feel sociable, talk with someone every day. It is best if it is face-to-face. Remember, they will want to help you.  Eat a regularly spaced and well-balanced diet.  Get plenty of rest.  Avoid alcohol and drugs because they will only make you feel worse and may also lower your inhibitions. Remove them from the home. If you are thinking of taking an overdose of your prescribed medicines, give your medicines to someone who can give them to you one day at a time. If you are on antidepressants, let your caregiver know of your feelings so he or she can provide a safer medicine, if that is a concern.  Remove weapons or poisons from your home.  Try to stick to routines. Follow a schedule and remind yourself that you have to keep that schedule every day.  Set some realistic goals and achieve them. Make a list and cross things off as you go. Accomplishments give a sense of worth. Wait until you are feeling better before doing things you find difficult or unpleasant to do.  If you are able, try to start exercising. Even half-hour periods of exercise each day will make you feel better. Getting out in the sun or into nature helps you recover from depression faster. If you have a favorite place to walk, take advantage of that.  Increase safe activities that have always given you pleasure. This may include playing your favorite music, reading a good book, painting a picture, or playing your favorite  instrument. Do whatever takes your mind off your depression.  Keep your living space well-lighted. GET HELP Contact a suicide hotline, crisis center, or local suicide prevention center for help right away. Local centers may include a hospital, clinic, community service organization, social service provider, or health department.  Call your local emergency services (911 in the [place]).  Call a suicide hotline:  1-800-273-TALK (1-279-465-6554) in the [place].  1-800-SUICIDE 229 555 1046) in the [place].  213-242-8525 in the [place] for Spanish-speaking counselors.  5-621-308-6VHQ (818) 644-8783) in the [place] for TTY users.  Visit the following websites for information and help:  National Suicide Prevention Lifeline: www.suicidepreventionlifeline.org  Hopeline: www.hopeline.[place] for Suicide Prevention: PromotionalLoans.co.za  For lesbian, gay, bisexual, transgender, or questioning youth, contact [employer]:  3-244-0-N-UUVOZD 936-719-2056) in the [place].  www.thetrevorproject.org  In [place], treatment resources are listed in each [place] with listings available under [employer] for [employer] or similar titles. Another source for Crisis Centres by [place] is located at http://www.suicideprevention.ca/in-crisis-now/find-a-crisis-centre-now/crisis-centres Document Released: 09/06/2002 Document Revised: 05/25/2011 Document Reviewed: 06/27/2013 [facility] Patient Information 2015 [place], [place]. This information is not intended to replace advice given to you by your health care provider. Make sure you discuss any questions you have with your health care provider.  Back Pain, Adult Back pain is very common. The pain often gets better over time. The cause of back pain is usually not dangerous. Most people can  learn to manage their back pain on their own.  HOME CARE   Stay active. Start with short walks on flat  ground if you can. Try to walk farther each day.  Do not sit, drive, or stand in one place for more than 30 minutes. Do not stay in bed.  Do not avoid exercise or work. Activity can help your back heal faster.  Be careful when you bend or lift an object. Bend at your knees, keep the object close to you, and do not twist.  Sleep on a firm mattress. Lie on your side, and bend your knees. If you lie on your back, put a pillow under your knees.  Only take medicines as told by your doctor.  Put ice on the injured area.  Put ice in a plastic bag.  Place a towel between your skin and the bag.  Leave the ice on for 15-20 minutes, 03-04 times a day for the first 2 to 3 days. After that, you can switch between ice and heat packs.  Ask your doctor about back exercises or massage.  Avoid feeling anxious or stressed. Find good ways to deal with stress, such as exercise. GET HELP RIGHT AWAY IF:   Your pain does not go away with rest or medicine.  Your pain does not go away in 1 week.  You have new problems.  You do not feel well.  The pain spreads into your legs.  You cannot control when you poop (bowel movement) or pee (urinate).  Your arms or legs feel weak or lose feeling (numbness).  You feel sick to your stomach (nauseous) or throw up (vomit).  You have belly (abdominal) pain.  You feel like you may pass out (faint). MAKE SURE YOU:   Understand these instructions.  Will watch your condition.  Will get help right away if you are not doing well or get worse. Document Released: 08/19/2007 Document Revised: 05/25/2011 Document Reviewed: 07/04/2013 [facility] Patient Information 2015 [place], [place]. This information is not intended to replace advice given to you by your health care provider. Make sure you discuss any questions you have with your health care provider.

## 2014-06-16 NOTE — ED Provider Notes (Signed)
1:40 PM Psychiatry evaluated and recommends discharge.  On exam, pt standing up in room, no distress, normal respiratory effort.  He denies SI.  Has outpatient resources.  He states he is ready to leave.   Clinical Impression: 1. Lumbosacral radiculopathy   2. Cough   3. Fever   4. Depression       Serita Grit, MD 06/16/14 1341

## 2014-06-16 NOTE — Consult Note (Signed)
Telepsych Consultation   Reason for Consult:  Suicidal/Homicidal Statements (inconsistent) Referring Physician:  EDP Patient Identification: [name] MRN:  [id number] Principal Diagnosis: <principal problem not specified> Diagnosis:   Patient Active Problem List   Diagnosis Date Noted  . Chest pain at rest [R07.9] 03/19/2013  . Acute non Q wave MI (myocardial infarction), initial episode of care [I21.4] 03/06/2013  . Personality characteristics that affect function [F60.9] 11/10/2011  . Mood disorder [F39] 10/28/2011  . Alcohol abuse [F10.10] 10/01/2011  . NSTEMI (non-ST elevated myocardial infarction) [I21.4] 10/01/2011  . Erectile dysfunction [N52.9] 08/01/2010  . Urge incontinence of urine [N39.41] 08/01/2010  . Chronic alcoholic pancreatitis [P49.8] 07/24/2010  . Tobacco abuse [Z72.0] 07/24/2010  . Hypertension [I10] 07/24/2010  . Hyperlipidemia [E78.5] 07/24/2010  . CVA (cerebral vascular accident) [I63.9] 07/24/2010  . Chronic back pain [M54.9, G89.29] 07/24/2010    Total Time spent with patient: 25 minutes  Subjective:   [name] is a 54 y.o. male patient admitted with reports of initial presentation with back pain and cough, later reporting suicidal and homicidal ideation when he did not receive narcotic pain medications as he requested. Pt spent the night in the ED and is now reporting that he was never suicidal nor homicidal but that he was "upset about them not giving me medicine for my chest pain." This NP spoke to pt's live-in girlfriend, who reports that there are actually no guns or weapons in the house and that she is able to take him to follow-up appointments. She reports no concerns of self-endangerment nor recent suicidal nor homicidal ideation. She does state that he has been complaining of chest pain with a cough lately and that he wanted help with this.  HPI:  [name] is an 54 y.o. male. The Pt arrived to [facility] reporting severe  back pain. Pt reported SI to the EDP. Pt currently denies SI. Pt states that severe back pain caused him to inform the EDP that he was SI. Pt confirms HI. Pt states that he wants to kill the man his girlfriend is having an affair with. Pt has access to weapons. Pt admitted to 1 previous SI attempt 10 years ago. Pt denied previous inpatient and outpatient treatment. Medical history reports previous inpatient treatment in 2012. Pt denied medication use. Pt denied SA and alcohol use. Pt denied a previous diagnosis. According to the Pt, his severe back pain began when he sneezed yesterday. Pt reports that since yesterday his back has been hurting. Pt states that his physician informed him that scare tissue is causing his back to ache.   HPI Elements:   Location:  Psychiatric. Quality:  Improving. Severity:  Moderate. Timing:  Intermittent. Duration:  Transient. Context:  Exacerbation of underlying depression and anxiety secondary to inability to receive desired medication management for his pain.  Past Medical History:  Past Medical History  Diagnosis Date  . Hypertension   . Stroke   . Hyperlipidemia   . Depression   . Chronic pancreatitis   . Substance abuse   . Seizures   . Myocardial infarction   . Coronary artery disease     Past Surgical History  Procedure Laterality Date  . Back surgery    . Coronary stent placement    . Left heart catheterization with coronary angiogram N/A 10/01/2011    Procedure: LEFT HEART CATHETERIZATION WITH CORONARY ANGIOGRAM;  Surgeon: Clent Demark, MD;  Location: [facility] CATH LAB;  Service: Cardiovascular;  Laterality: N/A;  .  Percutaneous coronary stent intervention (pci-s) N/A 10/02/2011    Procedure: PERCUTANEOUS CORONARY STENT INTERVENTION (PCI-S);  Surgeon: Clent Demark, MD;  Location: [facility] CATH LAB;  Service: Cardiovascular;  Laterality: N/A;  . Left heart catheterization with coronary angiogram N/A 03/06/2013    Procedure: LEFT HEART CATHETERIZATION  WITH CORONARY ANGIOGRAM;  Surgeon: Clent Demark, MD;  Location: [facility] CATH LAB;  Service: Cardiovascular;  Laterality: N/A;   Family History: History reviewed. No pertinent family history. Social History:  History  Alcohol Use  . Yes    Comment: beer every other day     History  Drug Use No    History   Social History  . Marital Status: Married    Spouse Name: N/A  . Number of Children: N/A  . Years of Education: N/A   Social History Main Topics  . Smoking status: Current Some Day Smoker -- 0.33 packs/day for 35 years    Types: Cigarettes  . Smokeless tobacco: Never Used     Comment: working on quitting  . Alcohol Use: Yes     Comment: beer every other day  . Drug Use: No  . Sexual Activity: Not on file   Other Topics Concern  . None   Social History Narrative   The patient lives alone with his significant other, Jonnie Finner.  The patient has a 84 year old daughter.  He is currently unemployed, and  has a high school degree.        He has a history of 1- 1.5 pack per day smoking of many years.  He states he has recently cut down to half pack per day (06/2010).      He has a history of heavy alcohol use, states typically drinking a fifth to a pint of gin daily, after stroke in 06/2010, decreased to one pint of gin per week, and after hospitalization for acute on chronic pancreatitis, was abstinent for at least 1-2 weeks (from 5/7 to 5/17)        He denies any illicit drug use.    Additional Social History:    Pain Medications: Pt denies Prescriptions: Pt denies Over the Counter: Pt denies History of alcohol / drug use?: No history of alcohol / drug abuse Longest period of sobriety (when/how long): NA                     Allergies:   Allergies  Allergen Reactions  . Morphine And Related Itching    Labs:  Results for orders placed or performed during the hospital encounter of 06/15/14 (from the past 48 hour(s))  CBC with Differential/Platelet     Status:  None   Collection Time: 06/15/14  3:25 AM  Result Value Ref Range   WBC 6.3 4.0 - 10.5 K/uL   RBC 4.65 4.22 - 5.81 MIL/uL   Hemoglobin 13.2 13.0 - 17.0 g/dL   HCT 39.5 39.0 - 52.0 %   MCV 84.9 78.0 - 100.0 fL   MCH 28.4 26.0 - 34.0 pg   MCHC 33.4 30.0 - 36.0 g/dL   RDW 14.4 11.5 - 15.5 %   Platelets 162 150 - 400 K/uL   Neutrophils Relative % 77 43 - 77 %   Neutro Abs 4.9 1.7 - 7.7 K/uL   Lymphocytes Relative 15 12 - 46 %   Lymphs Abs 0.9 0.7 - 4.0 K/uL   Monocytes Relative 5 3 - 12 %   Monocytes Absolute 0.3 0.1 - 1.0 K/uL  Eosinophils Relative 3 0 - 5 %   Eosinophils Absolute 0.2 0.0 - 0.7 K/uL   Basophils Relative 0 0 - 1 %   Basophils Absolute 0.0 0.0 - 0.1 K/uL  Comprehensive metabolic panel     Status: Abnormal   Collection Time: 06/15/14  3:25 AM  Result Value Ref Range   Sodium 137 135 - 145 mmol/L   Potassium 3.7 3.5 - 5.1 mmol/L   Chloride 103 96 - 112 mmol/L   CO2 24 19 - 32 mmol/L   Glucose, Bld 115 (H) 70 - 99 mg/dL   BUN 10 6 - 23 mg/dL   Creatinine, Ser 1.09 0.50 - 1.35 mg/dL   Calcium 8.6 8.4 - 10.5 mg/dL   Total Protein 7.0 6.0 - 8.3 g/dL   Albumin 4.0 3.5 - 5.2 g/dL   AST 26 0 - 37 U/L   ALT 15 0 - 53 U/L   Alkaline Phosphatase 86 39 - 117 U/L   Total Bilirubin 0.6 0.3 - 1.2 mg/dL   GFR calc non Af Amer 77 (L) >90 mL/min   GFR calc Af Amer 89 (L) >90 mL/min    Comment: (NOTE) The eGFR has been calculated using the CKD EPI equation. This calculation has not been validated in all clinical situations. eGFR's persistently <90 mL/min signify possible Chronic Kidney Disease.    Anion gap 10 5 - 15  Culture, blood (routine x 2)     Status: None (Preliminary result)   Collection Time: 06/15/14  3:25 AM  Result Value Ref Range   Specimen Description BLOOD LEFT FOREARM    Special Requests BOTTLES DRAWN AEROBIC AND ANAEROBIC 5CC    Culture             BLOOD CULTURE RECEIVED NO GROWTH TO DATE CULTURE WILL BE HELD FOR 5 DAYS BEFORE ISSUING A FINAL NEGATIVE  REPORT Performed at [facility]    Report Status PENDING   I-Stat CG4 Lactic Acid, ED     Status: None   Collection Time: 06/15/14  3:34 AM  Result Value Ref Range   Lactic Acid, Venous 1.48 0.5 - 2.0 mmol/L  Culture, blood (routine x 2)     Status: None (Preliminary result)   Collection Time: 06/15/14  4:17 AM  Result Value Ref Range   Specimen Description BLOOD RIGHT ARM    Special Requests BOTTLES DRAWN AEROBIC AND ANAEROBIC 5CC    Culture             BLOOD CULTURE RECEIVED NO GROWTH TO DATE CULTURE WILL BE HELD FOR 5 DAYS BEFORE ISSUING A FINAL NEGATIVE REPORT Performed at [facility]    Report Status PENDING   Urinalysis, Routine w reflex microscopic     Status: Abnormal   Collection Time: 06/15/14  4:22 AM  Result Value Ref Range   Color, Urine YELLOW YELLOW   APPearance CLEAR CLEAR   Specific Gravity, Urine 1.024 1.005 - 1.030   pH 6.5 5.0 - 8.0   Glucose, UA NEGATIVE NEGATIVE mg/dL   Hgb urine dipstick MODERATE (A) NEGATIVE   Bilirubin Urine NEGATIVE NEGATIVE   Ketones, ur 15 (A) NEGATIVE mg/dL   Protein, ur NEGATIVE NEGATIVE mg/dL   Urobilinogen, UA 1.0 0.0 - 1.0 mg/dL   Nitrite NEGATIVE NEGATIVE   Leukocytes, UA NEGATIVE NEGATIVE  Urine microscopic-add on     Status: None   Collection Time: 06/15/14  4:22 AM  Result Value Ref Range   Squamous Epithelial / LPF RARE RARE  RBC / HPF 7-10 <3 RBC/hpf   Bacteria, UA RARE RARE  I-stat troponin, ED     Status: None   Collection Time: 06/15/14  5:57 AM  Result Value Ref Range   Troponin i, poc 0.02 0.00 - 0.08 ng/mL   Comment 3            Comment: Due to the release kinetics of cTnI, a negative result within the first hours of the onset of symptoms does not rule out myocardial infarction with certainty. If myocardial infarction is still suspected, repeat the test at appropriate intervals.     Vitals: Blood pressure 128/67, pulse 76, temperature 99 F (37.2 C), temperature source Oral, resp.  rate 18, height $RemoveBe'5\' 10"'tikzTobaV$  (1.778 m), weight 72.576 kg (160 lb), SpO2 100 %.  Risk to Self: Suicidal Ideation: No Suicidal Intent: No Is patient at risk for suicide?: No Suicidal Plan?: No Access to Means: No What has been your use of drugs/alcohol within the last 12 months?: NA How many times?: 1 Other Self Harm Risks: NA Triggers for Past Attempts: None known Intentional Self Injurious Behavior: None Risk to Others: Homicidal Ideation: Yes-Currently Present Thoughts of Harm to Others: Yes-Currently Present Comment - Thoughts of Harm to Others: States wants to harm man his girlfriend is cheating with Current Homicidal Intent: Yes-Currently Present Current Homicidal Plan: No Access to Homicidal Means: Yes Describe Access to Homicidal Means: Access to weapons Identified Victim: The man his girlfriend is cheating with History of harm to others?: No Assessment of Violence: None Noted Violent Behavior Description: NA Does patient have access to weapons?: Yes (Comment) Criminal Charges Pending?: No Does patient have a court date: No Prior Inpatient Therapy: Prior Inpatient Therapy: No Prior Therapy Dates: NA Prior Therapy Facilty/Provider(s): NA Reason for Treatment: NA Prior Outpatient Therapy: Prior Outpatient Therapy: No Prior Therapy Dates: NA Prior Therapy Facilty/Provider(s): NA Reason for Treatment: NA  Current Facility-Administered Medications  Medication Dose Route Frequency Provider Last Rate Last Dose  . acetaminophen (TYLENOL) tablet 650 mg  650 mg Oral Q4H PRN Serita Grit, MD      . alum & mag hydroxide-simeth (MAALOX/MYLANTA) 200-200-20 MG/5ML suspension 30 mL  30 mL Oral PRN Serita Grit, MD      . amLODipine (NORVASC) tablet 10 mg  10 mg Oral Daily Lajean Saver, MD   10 mg at 06/15/14 1118  . aspirin EC tablet 81 mg  81 mg Oral Daily Lajean Saver, MD   81 mg at 06/15/14 1129  . atorvastatin (LIPITOR) tablet 80 mg  80 mg Oral q1800 Lajean Saver, MD   80 mg at 06/15/14  1745  . famotidine (PEPCID) tablet 20 mg  20 mg Oral BID PRN Lajean Saver, MD   20 mg at 06/15/14 1118  . gabapentin (NEURONTIN) capsule 300 mg  300 mg Oral TID Lajean Saver, MD   300 mg at 06/15/14 2300  . ibuprofen (ADVIL,MOTRIN) tablet 600 mg  600 mg Oral Q8H PRN Serita Grit, MD      . LORazepam (ATIVAN) tablet 1 mg  1 mg Oral Q8H PRN Serita Grit, MD      . metoprolol tartrate (LOPRESSOR) tablet 25 mg  25 mg Oral BID Lajean Saver, MD   25 mg at 06/15/14 2300  . ondansetron (ZOFRAN) tablet 4 mg  4 mg Oral Q8H PRN Serita Grit, MD      . ticagrelor [facility]) tablet 90 mg  90 mg Oral BID Lajean Saver, MD   90 mg at 06/15/14  2300  . traMADol (ULTRAM) tablet 50 mg  50 mg Oral Q6H PRN Lajean Saver, MD   50 mg at 06/16/14 0750   Current Outpatient Prescriptions  Medication Sig Dispense Refill  . aspirin EC 81 MG EC tablet Take 1 tablet (81 mg total) by mouth daily. 30 tablet 3  . cetirizine (ZYRTEC) 10 MG tablet Take 1 tablet (10 mg total) by mouth daily. 30 tablet 0  . ibuprofen (ADVIL,MOTRIN) 200 MG tablet Take 200 mg by mouth every 6 (six) hours as needed (severe pain).    . nitroGLYCERIN (NITROSTAT) 0.4 MG SL tablet Place 1 tablet (0.4 mg total) under the tongue every 5 (five) minutes x 3 doses as needed for chest pain. 25 tablet 12    Musculoskeletal: UTO, camera  Psychiatric Specialty Exam:     Blood pressure 128/67, pulse 76, temperature 99 F (37.2 C), temperature source Oral, resp. rate 18, height _0  (1.778 m), weight 72.576 kg (160 lb), SpO2 100 %.Body mass index is 22.96 kg/(m^2).  General Appearance: Fairly Groomed  Engineer, water::  Good  Speech:  Clear and Coherent and Normal Rate  Volume:  Normal  Mood:  Anxious  Affect:  Appropriate and Congruent  Thought Process:  Coherent and Goal Directed  Orientation:  Full (Time, Place, and Person)  Thought Content:  WDL  Suicidal Thoughts:  No  Homicidal Thoughts:  No  Memory:  Immediate;   Fair Recent;   Fair Remote;    Fair  Judgement:  Fair  Insight:  Good  Psychomotor Activity:  Normal  Concentration:  Good  Recall:  Good  Fund of Knowledge:Fair  Language: Good  Akathisia:  No  Handed:    AIMS (if indicated):     Assets:  Communication Skills Desire for Improvement Resilience Social Support  ADL's:  Intact  Cognition: WNL  Sleep:      Medical Decision Making: Established Problem, Stable/Improving (1), Self-Limited or Minor (1), Review of Psycho-Social Stressors (1) and Review or order clinical lab tests (1)   Treatment Plan Summary: See below  Plan:  No evidence of imminent risk to self or others at present.   Patient does not meet criteria for psychiatric inpatient admission. Supportive therapy provided about ongoing stressors. Refer to IOP. Discussed crisis plan, support from social network, calling 911, coming to the Emergency Department, and calling Suicide Hotline.  Disposition:  -Have pt sign no-harm contract -Give outpatient referrals for psychiatry/counseling -Discharge home   Benjamine Mola, FNP-BC 06/16/2014 10:23 AM

## 2014-06-16 NOTE — ED Notes (Signed)
RN called [name] and advised pt is ready for d/c per pt's request.

## 2014-06-16 NOTE — ED Notes (Signed)
PT SIGNED "NO HARM CONTRACT" - COPY FAXED TO [facility] TO PT.

## 2014-06-21 LAB — CULTURE, BLOOD (ROUTINE X 2)
Culture: NO GROWTH
Culture: NO GROWTH

## 2015-06-10 ENCOUNTER — Emergency Department (HOSPITAL_COMMUNITY)
Admission: EM | Admit: 2015-06-10 | Discharge: 2015-06-10 | Disposition: A | Discharge status: Home / Self Care | Payer: Medicare Other | Attending: Emergency Medicine | Admitting: Emergency Medicine

## 2015-06-10 NOTE — ED Notes (Signed)
Patient called for triage, not in lobby.

## 2015-06-10 NOTE — ED Notes (Signed)
Patient called for triage, no answer

## 2015-09-19 ENCOUNTER — Emergency Department (HOSPITAL_COMMUNITY)
Admission: EM | Admit: 2015-09-19 | Discharge: 2015-09-20 | Disposition: A | Discharge status: Other Acute Inpatient Hospital | Payer: Medicare HMO | Attending: Emergency Medicine | Admitting: Emergency Medicine

## 2015-09-19 ENCOUNTER — Encounter (HOSPITAL_COMMUNITY): Payer: Self-pay | Admitting: Emergency Medicine

## 2015-09-19 DIAGNOSIS — F10230 Alcohol dependence with withdrawal, uncomplicated: Secondary | ICD-10-CM

## 2015-09-19 DIAGNOSIS — F329 Major depressive disorder, single episode, unspecified: Secondary | ICD-10-CM | POA: Diagnosis not present

## 2015-09-19 DIAGNOSIS — E785 Hyperlipidemia, unspecified: Secondary | ICD-10-CM | POA: Diagnosis not present

## 2015-09-19 DIAGNOSIS — Z7982 Long term (current) use of aspirin: Secondary | ICD-10-CM | POA: Diagnosis not present

## 2015-09-19 DIAGNOSIS — Z79899 Other long term (current) drug therapy: Secondary | ICD-10-CM | POA: Insufficient documentation

## 2015-09-19 DIAGNOSIS — F10920 Alcohol use, unspecified with intoxication, uncomplicated: Secondary | ICD-10-CM

## 2015-09-19 DIAGNOSIS — R45850 Homicidal ideations: Secondary | ICD-10-CM | POA: Diagnosis not present

## 2015-09-19 DIAGNOSIS — F6381 Intermittent explosive disorder: Secondary | ICD-10-CM | POA: Diagnosis present

## 2015-09-19 DIAGNOSIS — F322 Major depressive disorder, single episode, severe without psychotic features: Secondary | ICD-10-CM | POA: Diagnosis not present

## 2015-09-19 DIAGNOSIS — I2510 Atherosclerotic heart disease of native coronary artery without angina pectoris: Secondary | ICD-10-CM | POA: Diagnosis not present

## 2015-09-19 DIAGNOSIS — I252 Old myocardial infarction: Secondary | ICD-10-CM | POA: Diagnosis not present

## 2015-09-19 DIAGNOSIS — I10 Essential (primary) hypertension: Secondary | ICD-10-CM | POA: Diagnosis not present

## 2015-09-19 DIAGNOSIS — R45851 Suicidal ideations: Secondary | ICD-10-CM | POA: Diagnosis not present

## 2015-09-19 DIAGNOSIS — F17210 Nicotine dependence, cigarettes, uncomplicated: Secondary | ICD-10-CM | POA: Insufficient documentation

## 2015-09-19 DIAGNOSIS — F10220 Alcohol dependence with intoxication, uncomplicated: Secondary | ICD-10-CM | POA: Insufficient documentation

## 2015-09-19 LAB — CBC
HCT: 41.6 % (ref 39.0–52.0)
HEMOGLOBIN: 14.3 g/dL (ref 13.0–17.0)
MCH: 29.3 pg (ref 26.0–34.0)
MCHC: 34.4 g/dL (ref 30.0–36.0)
MCV: 85.2 fL (ref 78.0–100.0)
Platelets: 290 10*3/uL (ref 150–400)
RBC: 4.88 MIL/uL (ref 4.22–5.81)
RDW: 13.2 % (ref 11.5–15.5)
WBC: 9.8 10*3/uL (ref 4.0–10.5)

## 2015-09-19 LAB — ETHANOL: Alcohol, Ethyl (B): 163 mg/dL — ABNORMAL HIGH (ref <5)

## 2015-09-19 LAB — COMPREHENSIVE METABOLIC PANEL
ALBUMIN: 5.1 g/dL — AB (ref 3.5–5.0)
ALT: 26 U/L (ref 17–63)
AST: 22 U/L (ref 15–41)
Alkaline Phosphatase: 101 U/L (ref 38–126)
Anion gap: 11 (ref 5–15)
BUN: 13 mg/dL (ref 6–20)
CHLORIDE: 102 mmol/L (ref 101–111)
CO2: 24 mmol/L (ref 22–32)
Calcium: 9.5 mg/dL (ref 8.9–10.3)
Creatinine, Ser: 1 mg/dL (ref 0.61–1.24)
GFR calc non Af Amer: >60 mL/min (ref >60)
GLUCOSE: 186 mg/dL — AB (ref 65–99)
POTASSIUM: 3.8 mmol/L (ref 3.5–5.1)
Sodium: 137 mmol/L (ref 135–145)
Total Bilirubin: 1 mg/dL (ref 0.3–1.2)
Total Protein: 8.4 g/dL — ABNORMAL HIGH (ref 6.5–8.1)

## 2015-09-19 LAB — RAPID URINE DRUG SCREEN, HOSP PERFORMED
AMPHETAMINES: NOT DETECTED
BENZODIAZEPINES: NOT DETECTED
Barbiturates: NOT DETECTED
COCAINE: NOT DETECTED
OPIATES: NOT DETECTED
TETRAHYDROCANNABINOL: NOT DETECTED

## 2015-09-19 LAB — ACETAMINOPHEN LEVEL

## 2015-09-19 LAB — SALICYLATE LEVEL

## 2015-09-19 MED ORDER — ACETAMINOPHEN 325 MG PO TABS: 650.0000 mg | ORAL_TABLET | ORAL | Status: DC | PRN

## 2015-09-19 MED ORDER — TRAMADOL HCL 50 MG PO TABS: 50.0000 mg | ORAL_TABLET | Freq: Every day | ORAL | Status: DC

## 2015-09-19 MED ORDER — ZOLPIDEM TARTRATE 5 MG PO TABS: 5.0000 mg | ORAL_TABLET | Freq: Every evening | ORAL | Status: DC | PRN

## 2015-09-19 MED ORDER — ATORVASTATIN CALCIUM 80 MG PO TABS
80.0000 mg | ORAL_TABLET | Freq: Every day | ORAL | Status: DC
  Filled 2015-09-19: qty 1

## 2015-09-19 MED ORDER — ALUM & MAG HYDROXIDE-SIMETH 200-200-20 MG/5ML PO SUSP: 30.0000 mL | ORAL | Status: DC | PRN

## 2015-09-19 MED ORDER — NICOTINE 21 MG/24HR TD PT24
21.0000 mg | MEDICATED_PATCH | Freq: Every day | TRANSDERMAL | Status: DC | PRN
Start: 2015-09-19 — End: 2015-09-20

## 2015-09-19 MED ORDER — ASPIRIN EC 81 MG PO TBEC
81.0000 mg | DELAYED_RELEASE_TABLET | Freq: Every day | ORAL | Status: DC
  Administered 2015-09-20: 81 mg via ORAL
  Filled 2015-09-19: qty 1

## 2015-09-19 MED ORDER — DICLOFENAC SODIUM 75 MG PO TBEC
75.0000 mg | DELAYED_RELEASE_TABLET | Freq: Every day | ORAL | Status: DC
  Administered 2015-09-20: 75 mg via ORAL
  Filled 2015-09-19: qty 1

## 2015-09-19 MED ORDER — IBUPROFEN 200 MG PO TABS: 600.0000 mg | ORAL_TABLET | Freq: Three times a day (TID) | ORAL | Status: DC | PRN

## 2015-09-19 MED ORDER — CLOPIDOGREL BISULFATE 75 MG PO TABS
75.0000 mg | ORAL_TABLET | Freq: Every day | ORAL | Status: DC
  Administered 2015-09-20: 75 mg via ORAL
  Filled 2015-09-19: qty 1

## 2015-09-19 MED ORDER — ONDANSETRON HCL 4 MG PO TABS: 4.0000 mg | ORAL_TABLET | Freq: Three times a day (TID) | ORAL | Status: DC | PRN

## 2015-09-19 MED ORDER — GABAPENTIN 300 MG PO CAPS
300.0000 mg | ORAL_CAPSULE | Freq: Every day | ORAL | Status: DC
  Administered 2015-09-20: 300 mg via ORAL
  Filled 2015-09-19: qty 1

## 2015-09-19 NOTE — ED Notes (Signed)
Patient has been wanded by security.  

## 2015-09-19 NOTE — ED Notes (Signed)
Patient presents with [employer] for SI/HI. IVC'd by spouse.

## 2015-09-19 NOTE — ED Provider Notes (Signed)
CSN: [id number]     Arrival date & time 09/19/15  1934 History   First MD Initiated Contact with Patient 09/19/15 2051     Chief Complaint  Patient presents with  . Suicidal  . Homicidal     (Consider location/radiation/quality/duration/timing/severity/associated sxs/prior Treatment) HPI   [name] is a 55 y.o. male presents for evaluation of homicidal ideation, with a plan to kill wife with a gun, and have the police kill himself. He is suspicious of people in his house when no one is there. He has walked around his house carrying a gun in a threatening manner. He states that he is not currently seeing a psychiatrist. He denies other problems this time. He is hostile and angry during the evaluation, and gives very minimal history.   Past Medical History  Diagnosis Date  . Hypertension   . Stroke ([place])   . Hyperlipidemia   . Depression   . Chronic pancreatitis ([place])   . Substance abuse   . Seizures ([place])   . Myocardial infarction ([place])   . Coronary artery disease    Past Surgical History  Procedure Laterality Date  . Back surgery    . Coronary stent placement    . Left heart catheterization with coronary angiogram N/A 10/01/2011    Procedure: LEFT HEART CATHETERIZATION WITH CORONARY ANGIOGRAM;  Surgeon: Clent Demark, MD;  Location: [facility] CATH LAB;  Service: Cardiovascular;  Laterality: N/A;  . Percutaneous coronary stent intervention (pci-s) N/A 10/02/2011    Procedure: PERCUTANEOUS CORONARY STENT INTERVENTION (PCI-S);  Surgeon: Clent Demark, MD;  Location: [facility] CATH LAB;  Service: Cardiovascular;  Laterality: N/A;  . Left heart catheterization with coronary angiogram N/A 03/06/2013    Procedure: LEFT HEART CATHETERIZATION WITH CORONARY ANGIOGRAM;  Surgeon: Clent Demark, MD;  Location: [facility] CATH LAB;  Service: Cardiovascular;  Laterality: N/A;   No family history on file. Social History  Substance Use Topics  . Smoking status: Current Some Day Smoker -- 0.33  packs/day for 35 years    Types: Cigarettes  . Smokeless tobacco: Never Used     Comment: working on quitting  . Alcohol Use: Yes     Comment: beer every other day    Review of Systems  Unable to perform ROS: Mental status change      Allergies  Morphine and related  Home Medications   Prior to Admission medications   Medication Sig Start Date End Date Taking? Authorizing Provider  aspirin EC 81 MG EC tablet Take 1 tablet (81 mg total) by mouth daily. 03/08/13  Yes Charolette Forward, MD  atorvastatin (LIPITOR) 80 MG tablet Take 80 mg by mouth daily.   Yes Historical Provider, MD  clopidogrel (PLAVIX) 75 MG tablet Take 75 mg by mouth daily.   Yes Historical Provider, MD  diclofenac (VOLTAREN) 75 MG EC tablet Take 75 mg by mouth daily.    Yes Historical Provider, MD  gabapentin (NEURONTIN) 300 MG capsule Take 300 mg by mouth daily.    Yes Historical Provider, MD  traMADol (ULTRAM) 50 MG tablet Take 50 mg by mouth daily.    Yes Historical Provider, MD   BP 126/99 mmHg  Pulse 97  Temp(Src) 97.9 F (36.6 C) (Oral)  Resp 18  Ht 5\' 11"  (1.803 m)  Wt 160 lb (72.576 kg)  BMI 22.33 kg/m2  SpO2 100% Physical Exam  Constitutional: He is oriented to person, place, and time. He appears well-developed and well-nourished.  HENT:  Head: Normocephalic and  atraumatic.  Right Ear: External ear normal.  Left Ear: External ear normal.  Eyes: Conjunctivae and EOM are normal. Pupils are equal, round, and reactive to light.  Neck: Normal range of motion and phonation normal. Neck supple.  Cardiovascular: Normal rate and regular rhythm.   Pulmonary/Chest: Effort normal. He exhibits no bony tenderness.  Musculoskeletal: Normal range of motion. He exhibits no tenderness.  Neurological: He is alert and oriented to person, place, and time. No cranial nerve deficit or sensory deficit. He exhibits normal muscle tone. Coordination normal.  Skin: Skin is warm, dry and intact.  Psychiatric:  Hostile,  angry, yelling.  Nursing note and vitals reviewed.   ED Course  Procedures (including critical care time)  Medications - No data to display  Patient Vitals for the past 24 hrs:  BP Temp Temp src Pulse Resp SpO2 Height Weight  09/19/15 2013 126/99 mmHg 97.9 F (36.6 C) Oral 97 18 100 % 5\' 11"  (1.803 m) 160 lb (72.576 kg)    IVC, reviewed, and examination and recommendation to determine necessity for involuntary commitment, was completed, by me.  10:17 PM Reevaluation with update and discussion. After initial assessment and treatment, an updated evaluation reveals No change in clinical status. At this time, he is medically cleared for treatment by psychiatry. Angellee [name] L    Labs Review Labs Reviewed  COMPREHENSIVE METABOLIC PANEL - Abnormal; Notable for the following:    Glucose, Bld 186 (*)    Total Protein 8.4 (*)    Albumin 5.1 (*)    All other components within normal limits  ETHANOL - Abnormal; Notable for the following:    Alcohol, Ethyl (B) 163 (*)    All other components within normal limits  ACETAMINOPHEN LEVEL - Abnormal; Notable for the following:    Acetaminophen (Tylenol), Serum <10 (*)    All other components within normal limits  SALICYLATE LEVEL  CBC  URINE RAPID DRUG SCREEN, HOSP PERFORMED    Imaging Review No results found. I have personally reviewed and evaluated these images and lab results as part of my medical decision-making.   EKG Interpretation None      MDM   Final diagnoses:  Homicidal ideation  Suicidal ideation  Alcohol intoxication, uncomplicated ([place])    Suicidal and homicidal ideation, with current alcohol intoxication. Patient under involuntary commitment.  Nursing Notes Reviewed/ Care Coordinated, and agree without changes. Applicable Imaging Reviewed.  Interpretation of Laboratory Data incorporated into ED treatment  Plan- as per TTS in conjunction with ongoing provider team    Daleen Bo, MD 09/19/15 2220

## 2015-09-20 ENCOUNTER — Inpatient Hospital Stay
Admission: EM | Admit: 2015-09-20 | Discharge: 2015-09-26 | DRG: 885 | Disposition: A | Discharge status: Home / Self Care | Payer: Medicare PPO | Source: Intra-hospital | Attending: Psychiatry | Admitting: Psychiatry

## 2015-09-20 DIAGNOSIS — Z915 Personal history of self-harm: Secondary | ICD-10-CM

## 2015-09-20 DIAGNOSIS — Z7902 Long term (current) use of antithrombotics/antiplatelets: Secondary | ICD-10-CM | POA: Diagnosis not present

## 2015-09-20 DIAGNOSIS — I10 Essential (primary) hypertension: Secondary | ICD-10-CM | POA: Diagnosis not present

## 2015-09-20 DIAGNOSIS — I252 Old myocardial infarction: Secondary | ICD-10-CM

## 2015-09-20 DIAGNOSIS — Z8673 Personal history of transient ischemic attack (TIA), and cerebral infarction without residual deficits: Secondary | ICD-10-CM

## 2015-09-20 DIAGNOSIS — Z885 Allergy status to narcotic agent status: Secondary | ICD-10-CM | POA: Diagnosis not present

## 2015-09-20 DIAGNOSIS — F2089 Other schizophrenia: Secondary | ICD-10-CM | POA: Diagnosis not present

## 2015-09-20 DIAGNOSIS — F10220 Alcohol dependence with intoxication, uncomplicated: Secondary | ICD-10-CM | POA: Diagnosis not present

## 2015-09-20 DIAGNOSIS — I2510 Atherosclerotic heart disease of native coronary artery without angina pectoris: Secondary | ICD-10-CM | POA: Diagnosis present

## 2015-09-20 DIAGNOSIS — Z79899 Other long term (current) drug therapy: Secondary | ICD-10-CM

## 2015-09-20 DIAGNOSIS — F329 Major depressive disorder, single episode, unspecified: Secondary | ICD-10-CM | POA: Diagnosis not present

## 2015-09-20 DIAGNOSIS — F332 Major depressive disorder, recurrent severe without psychotic features: Secondary | ICD-10-CM | POA: Diagnosis not present

## 2015-09-20 DIAGNOSIS — F1010 Alcohol abuse, uncomplicated: Secondary | ICD-10-CM | POA: Diagnosis present

## 2015-09-20 DIAGNOSIS — F17210 Nicotine dependence, cigarettes, uncomplicated: Secondary | ICD-10-CM | POA: Diagnosis present

## 2015-09-20 DIAGNOSIS — R45851 Suicidal ideations: Secondary | ICD-10-CM

## 2015-09-20 DIAGNOSIS — F6381 Intermittent explosive disorder: Secondary | ICD-10-CM | POA: Diagnosis present

## 2015-09-20 DIAGNOSIS — Z7982 Long term (current) use of aspirin: Secondary | ICD-10-CM

## 2015-09-20 DIAGNOSIS — M549 Dorsalgia, unspecified: Secondary | ICD-10-CM | POA: Diagnosis present

## 2015-09-20 DIAGNOSIS — G8929 Other chronic pain: Secondary | ICD-10-CM | POA: Diagnosis present

## 2015-09-20 DIAGNOSIS — F333 Major depressive disorder, recurrent, severe with psychotic symptoms: Secondary | ICD-10-CM | POA: Diagnosis present

## 2015-09-20 DIAGNOSIS — G4700 Insomnia, unspecified: Secondary | ICD-10-CM | POA: Diagnosis present

## 2015-09-20 DIAGNOSIS — Z955 Presence of coronary angioplasty implant and graft: Secondary | ICD-10-CM | POA: Diagnosis not present

## 2015-09-20 DIAGNOSIS — F17200 Nicotine dependence, unspecified, uncomplicated: Secondary | ICD-10-CM | POA: Diagnosis present

## 2015-09-20 DIAGNOSIS — F29 Unspecified psychosis not due to a substance or known physiological condition: Secondary | ICD-10-CM | POA: Diagnosis present

## 2015-09-20 DIAGNOSIS — F10230 Alcohol dependence with withdrawal, uncomplicated: Secondary | ICD-10-CM | POA: Diagnosis not present

## 2015-09-20 DIAGNOSIS — R45850 Homicidal ideations: Secondary | ICD-10-CM

## 2015-09-20 DIAGNOSIS — E785 Hyperlipidemia, unspecified: Secondary | ICD-10-CM | POA: Diagnosis present

## 2015-09-20 MED ORDER — ASPIRIN EC 81 MG PO TBEC
81.0000 mg | DELAYED_RELEASE_TABLET | Freq: Every day | ORAL | Status: DC
  Administered 2015-09-21 – 2015-09-26 (×6): 81 mg via ORAL
  Filled 2015-09-20 (×6): qty 1

## 2015-09-20 MED ORDER — ATORVASTATIN CALCIUM 20 MG PO TABS
80.0000 mg | ORAL_TABLET | Freq: Every day | ORAL | Status: DC
  Administered 2015-09-20 – 2015-09-25 (×5): 80 mg via ORAL
  Filled 2015-09-20 (×5): qty 4

## 2015-09-20 MED ORDER — MAGNESIUM HYDROXIDE 400 MG/5ML PO SUSP: 30.0000 mL | Freq: Every day | ORAL | Status: DC | PRN

## 2015-09-20 MED ORDER — ACETAMINOPHEN 325 MG PO TABS: 650.0000 mg | ORAL_TABLET | Freq: Four times a day (QID) | ORAL | Status: DC | PRN

## 2015-09-20 MED ORDER — ONDANSETRON HCL 4 MG PO TABS: 4.0000 mg | ORAL_TABLET | Freq: Three times a day (TID) | ORAL | Status: DC | PRN

## 2015-09-20 MED ORDER — ACETAMINOPHEN 325 MG PO TABS
650.0000 mg | ORAL_TABLET | ORAL | Status: DC | PRN
Start: 2015-09-20 — End: 2015-09-26

## 2015-09-20 MED ORDER — NICOTINE 21 MG/24HR TD PT24
21.0000 mg | MEDICATED_PATCH | Freq: Every day | TRANSDERMAL | Status: DC | PRN
  Administered 2015-09-20 – 2015-09-24 (×3): 21 mg via TRANSDERMAL
  Filled 2015-09-20 (×2): qty 1

## 2015-09-20 MED ORDER — DICLOFENAC SODIUM 25 MG PO TBEC
75.0000 mg | DELAYED_RELEASE_TABLET | Freq: Every day | ORAL | Status: DC
  Administered 2015-09-21 – 2015-09-26 (×6): 75 mg via ORAL
  Filled 2015-09-20 (×4): qty 3
  Filled 2015-09-20: qty 1
  Filled 2015-09-20: qty 3

## 2015-09-20 MED ORDER — ZOLPIDEM TARTRATE 5 MG PO TABS
5.0000 mg | ORAL_TABLET | Freq: Every evening | ORAL | Status: DC | PRN
  Administered 2015-09-20: 5 mg via ORAL
  Filled 2015-09-20 (×2): qty 1

## 2015-09-20 MED ORDER — GABAPENTIN 300 MG PO CAPS
300.0000 mg | ORAL_CAPSULE | Freq: Every day | ORAL | Status: DC
  Administered 2015-09-21 – 2015-09-26 (×6): 300 mg via ORAL
  Filled 2015-09-20 (×6): qty 1

## 2015-09-20 MED ORDER — ALUM & MAG HYDROXIDE-SIMETH 200-200-20 MG/5ML PO SUSP: 30.0000 mL | ORAL | Status: DC | PRN

## 2015-09-20 MED ORDER — IBUPROFEN 600 MG PO TABS: 600.0000 mg | ORAL_TABLET | Freq: Three times a day (TID) | ORAL | Status: DC | PRN

## 2015-09-20 MED ORDER — CLOPIDOGREL BISULFATE 75 MG PO TABS
75.0000 mg | ORAL_TABLET | Freq: Every day | ORAL | Status: DC
  Administered 2015-09-21 – 2015-09-26 (×6): 75 mg via ORAL
  Filled 2015-09-20 (×6): qty 1

## 2015-09-20 MED ORDER — MIRTAZAPINE 15 MG PO TABS
7.5000 mg | ORAL_TABLET | Freq: Every day | ORAL | Status: DC
  Administered 2015-09-20: 7.5 mg via ORAL
  Filled 2015-09-20: qty 1

## 2015-09-20 NOTE — BH Assessment (Signed)
[place] Assessment Progress Note  Per Corena Pilgrim, MD, this pt requires psychiatric hospitalization at this time. Letitia Libra, RN, [facility] reports that pt has been accepted to [facility] by Hampton Abbot, MD; Ian Malkin corroborates this. Pt is under IVC, and IVC documents have been faxed to [phone], per Calvin's request. Pt's nurse has, Olivette, been notified, and agrees to call report to [phone].  Pt is to be transported via [facility].   Jalene Mullet, [place]  Triage Specialist  [phone]

## 2015-09-20 NOTE — [facility] Group Notes (Signed)
[facility] Group Notes:  (Nursing/MHT/Case Management/Adjunct)  Date:  09/20/2015  Time:  11:26 PM  Type of Therapy:  Evening Wrap-up Group  Participation Level:  Did Not Attend  Participation Quality:  N/A  Affect:  N/A  Cognitive:  N/A  Insight:  None  Engagement in Group:  N/A  Modes of Intervention:  Discussion  Summary of Progress/Problems:  Levonne Spiller 09/20/2015, 11:26 PM

## 2015-09-20 NOTE — BH Assessment (Signed)
Tele Assessment Note   [name] is an 55 y.o. male.  -Clinician reviewed note by Dr. Eulis Foster.  Patient has been brought in on IVC.  He reportedly has plan to kill his wife with a gun then have the police kill him.  He believes that people may be in the home when he is not there.  Paitent has been walking around his house carrying a gun in a threatening manner.    Patient was irritable and gave short answers, usually "no."  Patient denies SI, HI or A/V hallucinations.  He denies drinking ETOH although his BAL was 163 at 21:42.  Patient acts impatient with questions.  Patient at a couple of points tenses up and says "I'm trying to stay calm."  When it was explained that he was on IVC (and not under arrest), patient got mad and called this clinician a "white motherfucker" and asked him to leave the room.  Patient has no current outpatient provider and has no previous inpatient care.  -Patient is on IVC and will need to be seen by psychiatry in AM.  Patient currently meets inpatient care criteria per Darlyne Russian, PA  Diagnosis: MDD recurren, severe; ETOH use d/o severe  Past Medical History:  Past Medical History  Diagnosis Date  . Hypertension   . Stroke ([place])   . Hyperlipidemia   . Depression   . Chronic pancreatitis ([place])   . Substance abuse   . Seizures ([place])   . Myocardial infarction ([place])   . Coronary artery disease     Past Surgical History  Procedure Laterality Date  . Back surgery    . Coronary stent placement    . Left heart catheterization with coronary angiogram N/A 10/01/2011    Procedure: LEFT HEART CATHETERIZATION WITH CORONARY ANGIOGRAM;  Surgeon: Clent Demark, MD;  Location[facility] C[facility] LAB;  Service: Cardiovascular;  Laterality: N/A;  . Percutaneous coronary stent intervention (pci-s) N/A 10/02/2011    Procedure: PERCUTANEOUS CORONARY STENT INTERVENTION (PCI-S);  Surgeon: Clent Demark, MD;  Location: [facility] CATH LAB;  Service: Cardiovascular;  Laterality:  N/A;  . Left heart catheterization with coronary angiogram N/A 03/06/2013    Procedure: LEFT HEART CATHETERIZATION WITH CORONARY ANGIOGRAM;  Surgeon: Clent Demark, MD;  Location: [facility] CATH LAB;  Service: Cardiovascular;  Laterality: N/A;    Family History: No family history on file.  Social History:  reports that he has been smoking Cigarettes.  He has a 11.55 pack-year smoking history. He has never used smokeless tobacco. He reports that he drinks alcohol. He reports that he does not use illicit drugs.  Additional Social History:  Alcohol / Drug Use Pain Medications: See PTA medication list. Prescriptions: See PTA medication list Over the Counter: See PTA medication list History of alcohol / drug use?: Yes Substance #1 Name of Substance 1: ETOH 1 - Age of First Use: Unknown 1 - Amount (size/oz): Unknown 1 - Frequency: Unknown 1 - Duration: Patient denies SA issues but does have a BAL of 163 at 21:42. 1 - Last Use / Amount: 07/06  CIWA: CIWA-Ar BP: 126/99 mmHg Pulse Rate: 97 COWS:    PATIENT STRENGTHS: (choose at least two) Average or above average intelligence Capable of independent living Communication skills  Allergies:  Allergies  Allergen Reactions  . Morphine And Related Itching    Home Medications:  (Not in a hospital admission)  OB/GYN Status:  No LMP for male patient.  General Assessment Data Location of Assessment: [facility] ED  TTS Assessment: In system Is this a Tele or Face-to-Face Assessment?: Face-to-Face Is this an Initial Assessment or a Re-assessment for this encounter?: Initial Assessment Marital status: Married Is patient pregnant?: No Pregnancy Status: No Living Arrangements: Spouse/significant other Can pt return to current living arrangement?:  (Unknown) Admission Status: Involuntary Is patient capable of signing voluntary admission?: No Referral Source: Self/Family/Friend Insurance type: [facility]     Crisis Care Plan Living Arrangements:  Spouse/significant other Name of Psychiatrist: None Name of Therapist: None  Education Status Is patient currently in school?: No  Risk to self with the past 6 months Suicidal Ideation: No Has patient been a risk to self within the past 6 months prior to admission? : Yes (According to IVC papers patient had threatened death by cop.) Suicidal Intent: Yes-Currently Present Has patient had any suicidal intent within the past 6 months prior to admission? : Yes Is patient at risk for suicide?: Yes Suicidal Plan?: Yes-Currently Present Has patient had any suicidal plan within the past 6 months prior to admission? : No Specify Current Suicidal Plan: "Suicide by cop" Access to Means: Yes Specify Access to Suicidal Means: Making threatening gestures to police What has been your use of drugs/alcohol within the last 12 months?: ETOH in BAL Previous Attempts/Gestures: Yes How many times?: 1 Other Self Harm Risks: Carrying around a gun. Triggers for Past Attempts: Unknown Intentional Self Injurious Behavior: None Family Suicide History: Unknown Recent stressful life event(s): Conflict (Comment) (Thinks wife is cheating on him & people coming in home.) Persecutory voices/beliefs?: Yes Depression: Yes Depression Symptoms: Isolating, Feeling angry/irritable, Loss of interest in usual pleasures Substance abuse history and/or treatment for substance abuse?: Yes Suicide prevention information given to non-admitted patients: Not applicable  Risk to Others within the past 6 months Homicidal Ideation: Yes-Currently Present Does patient have any lifetime risk of violence toward others beyond the six months prior to admission? : Unknown Thoughts of Harm to Others: Yes-Currently Present Comment - Thoughts of Harm to Others: Threatened to kill wife Current Homicidal Intent: Yes-Currently Present Current Homicidal Plan: Yes-Currently Present Describe Current Homicidal Plan: Shoot wife Access to  Homicidal Means: Yes Describe Access to Homicidal Means: Reportedly has a gun. Identified Victim: Wife History of harm to others?: No Assessment of Violence:  (Unknown) Violent Behavior Description: Unknown Does patient have access to weapons?: Yes (Comment) (Pt has a gun) Criminal Charges Pending?:  (Unknown) Does patient have a court date:  (Unknown) Is patient on probation?:  (Unknown)  Psychosis Hallucinations: None noted Delusions: None noted  Mental Status Report Appearance/Hygiene: Disheveled, In scrubs Eye Contact: Poor Motor Activity: Freedom of movement, Unremarkable Speech: Rapid, Abusive (Called clinician "white mofo") Level of Consciousness: Alert, Irritable Mood: Anxious, Suspicious, Angry, Apprehensive, Irritable, Threatening Affect: Anxious, Apprehensive, Irritable, Angry, Threatening Anxiety Level: Severe Thought Processes: Coherent Judgement: Impaired Orientation: Appropriate for developmental age Obsessive Compulsive Thoughts/Behaviors: None  Cognitive Functioning Concentration: Unable to Assess Memory: Unable to Assess IQ: Average Insight: Poor Impulse Control: Poor Appetite:  (UTA) Weight Loss: 0 Weight Gain: 0 Sleep: Unable to Assess Total Hours of Sleep:  (?) Vegetative Symptoms: Unable to Assess  ADLScreening [facility] Assessment Services) Patient's cognitive ability adequate to safely complete daily activities?: Yes Patient able to express need for assistance with ADLs?: Yes Independently performs ADLs?: Yes (appropriate for developmental age)  Prior Inpatient Therapy Prior Inpatient Therapy: No Prior Therapy Dates: N/A Prior Therapy Facilty/Provider(s): N/A Reason for Treatment: N/A  Prior Outpatient Therapy Prior Outpatient Therapy: No Prior Therapy Dates: N/A  Prior Therapy Facilty/Provider(s): Pt denies Reason for Treatment: Pt denies Does patient have an ACCT team?: No Does patient have Intensive In-House Services?  : No Does patient  have [facility] services? : No Does patient have P4CC services?: No  ADL Screening (condition at time of admission) Patient's cognitive ability adequate to safely complete daily activities?: Yes Is the patient deaf or have difficulty hearing?: No Does the patient have difficulty seeing, even when wearing glasses/contacts?: No Does the patient have difficulty concentrating, remembering, or making decisions?: No Patient able to express need for assistance with ADLs?: Yes Does the patient have difficulty dressing or bathing?: No Independently performs ADLs?: Yes (appropriate for developmental age) Does the patient have difficulty walking or climbing stairs?: No Weakness of Legs: None Weakness of Arms/Hands: None       Abuse/Neglect Assessment (Assessment to be complete while patient is alone) Physical Abuse: Denies Verbal Abuse: Denies Sexual Abuse: Denies Exploitation of patient/patient's resources: Denies Self-Neglect: Denies     Regulatory affairs officer (For Healthcare) Does patient have an advance directive?: No Would patient like information on creating an advanced directive?: No - patient declined information    Additional Information 1:1 In Past 12 Months?: No CIRT Risk: Yes Elopement Risk: Yes Does patient have medical clearance?: Yes     Disposition:  Disposition Initial Assessment Completed for this Encounter: Yes Disposition of Patient: Other dispositions Other disposition(s): Other (Comment) (Pt to be seen by psychiatry in AM on 07/07)  Curlene Dolphin Ray 09/20/2015 12:29 AM

## 2015-09-20 NOTE — ED Notes (Addendum)
Veteran under IVC, presents with hostile and aggressive behavior towards spouse.  Pt wanted to kill spouse and then have [employer] shoot him.  Pt calm & cooperative at present,  Pt AAO x 3, no distress noted,  Monitoring for safety, Q 15 min checks in effect.

## 2015-09-20 NOTE — H&P (Addendum)
Psychiatric Admission Assessment Adult  Patient Identification: [name] MRN:  [id number] Date of Evaluation:  09/20/2015 Chief Complaint:  Depression Principal Diagnosis: Major depressive disorder, recurrent severe without psychotic features ([place]) Diagnosis:   Patient Active Problem List   Diagnosis Date Noted  . Intermittent explosive disorder [F63.81] 09/20/2015  . Major depressive disorder, recurrent severe without psychotic features ([place]) [F33.2] 09/20/2015  . Suicidal ideation [R45.851] 09/20/2015  . Homicidal ideation [R45.850] 09/20/2015  . Chest pain at rest [R07.9] 03/19/2013  . Acute non Q wave MI (myocardial infarction), initial episode of care ([place]) [I21.4] 03/06/2013  . Alcohol use disorder, mild, abuse [F10.10] 10/01/2011  . NSTEMI (non-ST elevated myocardial infarction) ([place]) [I21.4] 10/01/2011  . Erectile dysfunction [N52.9] 08/01/2010  . Urge incontinence of urine [N39.41] 08/01/2010  . Chronic alcoholic pancreatitis ([place]) [K86.0] 07/24/2010  . Tobacco use disorder [F17.200] 07/24/2010  . Hypertension [I10] 07/24/2010  . Hyperlipidemia [E78.5] 07/24/2010  . CVA (cerebral vascular accident) ([place]) [I63.9] 07/24/2010  . Chronic back pain [M54.9, G89.29] 07/24/2010   History of Present Illness:   Identifying data. [name] is a 55 year old male with a history of depression.  Chief complaint. "I want a divorce."  History of present illness. Information was obtained from the patient and the chart. The patient reports mild symptoms of depression that are mostly related to his deteriorating relationship with his wife. She has been unfaithful to him for years and they do not have a relationship for the past year and a half. He decided not to move out of the house as it would constitute an apartment. They did not sign separation papers. On the night of admission to [facility] emergency room the patient was drinking all night long. He believes that he made some  homicidal threats towards his wife but claims that his wife was at work. He does not know how he ended up in the emergency room. According to his records the patient has been threatening his wife with a gun and wanted to be killed by police. He denies having a gun. He reports mild symptoms of depression with poor sleep, decreased appetite and anhedonia, feeling of guilt and hopelessness worthlessness, low energy and concentration, some irritability. He denies feeling suicidal or homicidal and sober. He denies any psychotic symptoms or signs suggestive of bipolar mania. He denies heightened anxiety. He adamantly denies that he drinks excessively. He does not use illicit substances. There is no history of prescription pill abuse.  His wife reported that for several years the atient has been jealous and suspicious. He has been staling her at work and there is a restraining order at her workplace. There was a stand off with the police before admission. The gun tht he was reportedly threatening his wife with has never been found. The wife is supportive and not at all afraid of the patient. She will welcome him back.   Past psychiatric history. The patient was hospitalized once many years ago after suicide attempt by overdose. She reports that it was in a similar scenario when his wife was unfaithful. He has not been taking any medications. He does not see a psychiatrist.  Family psychiatric history. Nonreported.  Social history. He is disabled for multiple medical problems. He lives with his wife and his daughter. He has every intention to see a lawyer as soon as he gets out of the hospital. He does not want to move out of the house but has a friend whom he could stay for a  few days following discharge.  Total Time spent with patient: 1 hour  Is the patient at risk to self? Yes.    Has the patient been a risk to self in the past 6 months? No.  Has the patient been a risk to self within the distant past?  Yes.    Is the patient a risk to others? Yes.    Has the patient been a risk to others in the past 6 months? No.  Has the patient been a risk to others within the distant past? No.   Prior Inpatient Therapy:   Prior Outpatient Therapy:    Alcohol Screening: Patient refused Alcohol Screening Tool: Yes 1. How often do you have a drink containing alcohol?: 4 or more times a week (Patient states he drinks one beer per day ) 2. How many drinks containing alcohol do you have on a typical day when you are drinking?: 1 or 2 3. How often do you have six or more drinks on one occasion?: Never Preliminary Score: 0 9. Have you or someone else been injured as a result of your drinking?: No 10. Has a relative or friend or a doctor or another health worker been concerned about your drinking or suggested you cut down?: No Alcohol Use Disorder Identification Test Final Score (AUDIT): 4 Brief Intervention: Patient declined brief intervention Substance Abuse History in the last 12 months:  Yes.   Consequences of Substance Abuse: Negative Previous Psychotropic Medications: No  Psychological Evaluations: No  Past Medical History:  Past Medical History  Diagnosis Date  . Hypertension   . Stroke ([place])   . Hyperlipidemia   . Depression   . Chronic pancreatitis ([place])   . Substance abuse   . Seizures ([place])   . Myocardial infarction ([place])   . Coronary artery disease     Past Surgical History  Procedure Laterality Date  . Back surgery    . Coronary stent placement    . Left heart catheterization with coronary angiogram N/A 10/01/2011    Procedure: LEFT HEART CATHETERIZATION WITH CORONARY ANGIOGRAM;  Surgeon: Clent Demark, MD;  Location: [facility] CATH LAB;  Service: Cardiovascular;  Laterality: N/A;  . Percutaneous coronary stent intervention (pci-s) N/A 10/02/2011    Procedure: PERCUTANEOUS CORONARY STENT INTERVENTION (PCI-S);  Surgeon: Clent Demark, MD;  Location: [facility] CATH LAB;  Service: Cardiovascular;   Laterality: N/A;  . Left heart catheterization with coronary angiogram N/A 03/06/2013    Procedure: LEFT HEART CATHETERIZATION WITH CORONARY ANGIOGRAM;  Surgeon: Clent Demark, MD;  Location: [facility] CATH LAB;  Service: Cardiovascular;  Laterality: N/A;   Family History: History reviewed. No pertinent family history.  Tobacco Screening: _0 [phone])::1)@ Social History:  History  Alcohol Use  . Yes    Comment: beer every other day     History  Drug Use No    Additional Social History:                           Allergies:   Allergies  Allergen Reactions  . Morphine And Related Itching   Lab Results:  Results for orders placed or performed during the hospital encounter of 09/19/15 (from the past 48 hour(s))  Rapid urine drug screen (hospital performed)     Status: None   Collection Time: 09/19/15  9:24 PM  Result Value Ref Range   Opiates NONE DETECTED NONE DETECTED   Cocaine NONE DETECTED NONE DETECTED   Benzodiazepines NONE DETECTED NONE  DETECTED   Amphetamines NONE DETECTED NONE DETECTED   Tetrahydrocannabinol NONE DETECTED NONE DETECTED   Barbiturates NONE DETECTED NONE DETECTED    Comment:        DRUG SCREEN FOR MEDICAL PURPOSES ONLY.  IF CONFIRMATION IS NEEDED FOR ANY PURPOSE, NOTIFY LAB WITHIN 5 DAYS.        LOWEST DETECTABLE LIMITS FOR URINE DRUG SCREEN Drug Class       Cutoff (ng/mL) Amphetamine      1000 Barbiturate      200 Benzodiazepine   716 Tricyclics       967 Opiates          300 Cocaine          300 THC              50   Comprehensive metabolic panel     Status: Abnormal   Collection Time: 09/19/15  9:42 PM  Result Value Ref Range   Sodium 137 135 - 145 mmol/L   Potassium 3.8 3.5 - 5.1 mmol/L   Chloride 102 101 - 111 mmol/L   CO2 24 22 - 32 mmol/L   Glucose, Bld 186 (H) 65 - 99 mg/dL   BUN 13 6 - 20 mg/dL   Creatinine, Ser 1.00 0.61 - 1.24 mg/dL   Calcium 9.5 8.9 - 10.3 mg/dL   Total Protein 8.4 (H) 6.5 - 8.1 g/dL   Albumin  5.1 (H) 3.5 - 5.0 g/dL   AST 22 15 - 41 U/L   ALT 26 17 - 63 U/L   Alkaline Phosphatase 101 38 - 126 U/L   Total Bilirubin 1.0 0.3 - 1.2 mg/dL   GFR calc non Af Amer >60 >60 mL/min   GFR calc Af Amer >60 >60 mL/min    Comment: (NOTE) The eGFR has been calculated using the CKD EPI equation. This calculation has not been validated in all clinical situations. eGFR's persistently <60 mL/min signify possible Chronic Kidney Disease.    Anion gap 11 5 - 15  Ethanol     Status: Abnormal   Collection Time: 09/19/15  9:42 PM  Result Value Ref Range   Alcohol, Ethyl (B) 163 (H) <5 mg/dL    Comment:        LOWEST DETECTABLE LIMIT FOR SERUM ALCOHOL IS 5 mg/dL FOR MEDICAL PURPOSES ONLY   Salicylate level     Status: None   Collection Time: 09/19/15  9:42 PM  Result Value Ref Range   Salicylate Lvl <8.9 2.8 - 30.0 mg/dL  Acetaminophen level     Status: Abnormal   Collection Time: 09/19/15  9:42 PM  Result Value Ref Range   Acetaminophen (Tylenol), Serum <10 (L) 10 - 30 ug/mL    Comment:        THERAPEUTIC CONCENTRATIONS VARY SIGNIFICANTLY. A RANGE OF 10-30 ug/mL MAY BE AN EFFECTIVE CONCENTRATION FOR MANY PATIENTS. HOWEVER, SOME ARE BEST TREATED AT CONCENTRATIONS OUTSIDE THIS RANGE. ACETAMINOPHEN CONCENTRATIONS >150 ug/mL AT 4 HOURS AFTER INGESTION AND >50 ug/mL AT 12 HOURS AFTER INGESTION ARE OFTEN ASSOCIATED WITH TOXIC REACTIONS.   cbc     Status: None   Collection Time: 09/19/15  9:42 PM  Result Value Ref Range   WBC 9.8 4.0 - 10.5 K/uL   RBC 4.88 4.22 - 5.81 MIL/uL   Hemoglobin 14.3 13.0 - 17.0 g/dL   HCT 41.6 39.0 - 52.0 %   MCV 85.2 78.0 - 100.0 fL   MCH 29.3 26.0 - 34.0 pg   MCHC 34.4  30.0 - 36.0 g/dL   RDW 13.2 11.5 - 15.5 %   Platelets 290 150 - 400 K/uL    Blood Alcohol level:  Lab Results  Component Value Date   [facility] 163* 09/19/2015   ETH <11 73/42/8768    Metabolic Disorder Labs:  Lab Results  Component Value Date   HGBA1C 6.4* 03/19/2013   MPG 137*  03/19/2013   MPG 131* 03/06/2013   No results found for: PROLACTIN Lab Results  Component Value Date   CHOL 134 03/20/2013   TRIG 51 03/20/2013   HDL 49 03/20/2013   CHOLHDL 2.7 03/20/2013   VLDL 10 03/20/2013   LDLCALC 75 03/20/2013   LDLCALC 127* 07/29/2012    Current Medications: Current Facility-Administered Medications  Medication Dose Route Frequency Provider Last Rate Last Dose  . acetaminophen (TYLENOL) tablet 650 mg  650 mg Oral Q4H PRN Patrecia Pour, NP      . alum & mag hydroxide-simeth (MAALOX/MYLANTA) 200-200-20 MG/5ML suspension 30 mL  30 mL Oral PRN Patrecia Pour, NP      . Derrill Memo ON 09/21/2015] aspirin EC tablet 81 mg  81 mg Oral Daily Patrecia Pour, NP      . atorvastatin (LIPITOR) tablet 80 mg  80 mg Oral q1800 Patrecia Pour, NP      . Derrill Memo ON 09/21/2015] clopidogrel (PLAVIX) tablet 75 mg  75 mg Oral Daily Patrecia Pour, NP      . Derrill Memo ON 09/21/2015] diclofenac (VOLTAREN) EC tablet 75 mg  75 mg Oral Daily Patrecia Pour, NP      . Derrill Memo ON 09/21/2015] gabapentin (NEURONTIN) capsule 300 mg  300 mg Oral Daily Patrecia Pour, NP      . ibuprofen (ADVIL,MOTRIN) tablet 600 mg  600 mg Oral Q8H PRN Patrecia Pour, NP      . magnesium hydroxide (MILK OF MAGNESIA) suspension 30 mL  30 mL Oral Daily PRN Patrecia Pour, NP      . nicotine (NICODERM CQ - dosed in mg/24 hours) patch 21 mg  21 mg Transdermal Daily PRN Patrecia Pour, NP      . zolpidem (AMBIEN) tablet 5 mg  5 mg Oral QHS PRN Patrecia Pour, NP       PTA Medications: Prescriptions prior to admission  Medication Sig Dispense Refill Last Dose  . aspirin EC 81 MG EC tablet Take 1 tablet (81 mg total) by mouth daily. 30 tablet 3 09/19/2015 at Unknown time  . atorvastatin (LIPITOR) 80 MG tablet Take 80 mg by mouth daily.   09/19/2015 at Unknown time  . clopidogrel (PLAVIX) 75 MG tablet Take 75 mg by mouth daily.   09/19/2015 at morning  . diclofenac (VOLTAREN) 75 MG EC tablet Take 75 mg by mouth daily.    09/19/2015 at  Unknown time  . gabapentin (NEURONTIN) 300 MG capsule Take 300 mg by mouth daily.    09/19/2015 at Unknown time    Musculoskeletal: Strength & Muscle Tone: within normal limits Gait & Station: normal Patient leans: N/A  Psychiatric Specialty Exam: I reviewed physical exam performed in the emergency room and agree with the findings. Physical Exam  Nursing note and vitals reviewed.   Review of Systems  Musculoskeletal: Positive for back pain.  Psychiatric/Behavioral: Positive for depression and suicidal ideas. The patient is nervous/anxious.   All other systems reviewed and are negative.   Blood pressure 142/80, pulse 85, temperature 98.4 F (36.9 C), temperature source Oral, resp.  rate 16, height _0  (1.803 m), weight 71.215 kg (157 lb), SpO2 100 %.Body mass index is 21.91 kg/(m^2).  See SRA.                                                         Treatment Plan Summary: Daily contact with patient to assess and evaluate symptoms and progress in treatment and Medication management   [name] is a 55 year old male with a history of depression admitted for making suicidal and homicidal threats while drunk in the context of relationship problems.  1. Suicidal and homicidal ideation. The patient is able to contract for safety in the hospital.  2. Mood. He is not interested in pharmacotherapy. He believes that he takes too many medications for his medical problems already. We will try to offer Remeron for depression.  3. Insomnia. He takes Ambien for sleep.  4. Alcohol abuse. The patient denies drinking on a regular basis. There are no symptoms of withdrawal. Vital signs are stable.  5. History of CVA. He is on aspirin and Plavix.  6. Chronic back pain. He is on call when necessary and Neurontin.  7. Dyslipidemia. He is on Lipitor.  8. Smoking. Nicotine patch is available.  9. Disposition. He will be discharged to home. He will follow up with a  psychiatrist in [place].   Observation Level/Precautions:  15 minute checks  Laboratory:  CBC Chemistry Profile UDS UA  Psychotherapy:    Medications:    Consultations:    Discharge Concerns:    Estimated LOS:  Other:     I certify that inpatient services furnished can reasonably be expected to improve the patient's condition.    Orson Slick, MD 7/7/20174:17 PM

## 2015-09-20 NOTE — Progress Notes (Signed)
Patient alert and oriented x 3 and is cooperative with admission interview and assessment. No SI/HI/AVH at this time. Patient is irritable that his wife had him committed to hospital. States that yesterday he was drinking at his house and was having thoughts to kill his wife. Patient reports he drinks one beer per day. BAL on admit was 163. Patient guarded. Lunch tray ordered. Toiletries provided. Orient to therapeutic milieu, safety maintained.

## 2015-09-20 NOTE — Tx Team (Signed)
Initial Interdisciplinary Treatment Plan   PATIENT STRESSORS: Marital or family conflict Substance abuse   PATIENT STRENGTHS: Capable of independent living Communication skills   PROBLEM LIST: Problem List/Patient Goals Date to be addressed Date deferred Reason deferred Estimated date of resolution  Substance abuse  7/7           psychosis 7/7                                          DISCHARGE CRITERIA:  Ability to meet basic life and health needs Adequate post-discharge living arrangements Improved stabilization in mood, thinking, and/or behavior  PRELIMINARY DISCHARGE PLAN: Attend aftercare/continuing care group Placement in alternative living arrangements  PATIENT/FAMIILY INVOLVEMENT: This treatment plan has been presented to and reviewed with the patient, [name], and/or family member, .  The patient and family have been given the opportunity to ask questions and make suggestions.  Raul Del 09/20/2015, 3:04 PM

## 2015-09-20 NOTE — BHH Suicide Risk Assessment (Signed)
[facility] Admission Suicide Risk Assessment   Nursing information obtained from:    Demographic factors:    Current Mental Status:    Loss Factors:    Historical Factors:    Risk Reduction Factors:     Total Time spent with patient: 1 hour Principal Problem: Major depressive disorder, recurrent severe without psychotic features ([place]) Diagnosis:   Patient Active Problem List   Diagnosis Date Noted  . Intermittent explosive disorder [F63.81] 09/20/2015  . Major depressive disorder, recurrent severe without psychotic features ([place]) [F33.2] 09/20/2015  . Suicidal ideation [R45.851] 09/20/2015  . Homicidal ideation [R45.850] 09/20/2015  . Chest pain at rest [R07.9] 03/19/2013  . Acute non Q wave MI (myocardial infarction), initial episode of care ([place]) [I21.4] 03/06/2013  . Alcohol use disorder, mild, abuse [F10.10] 10/01/2011  . NSTEMI (non-ST elevated myocardial infarction) ([place]) [I21.4] 10/01/2011  . Erectile dysfunction [N52.9] 08/01/2010  . Urge incontinence of urine [N39.41] 08/01/2010  . Chronic alcoholic pancreatitis ([place]) [K86.0] 07/24/2010  . Tobacco use disorder [F17.200] 07/24/2010  . Hypertension [I10] 07/24/2010  . Hyperlipidemia [E78.5] 07/24/2010  . CVA (cerebral vascular accident) ([place]) [I63.9] 07/24/2010  . Chronic back pain [M54.9, G89.29] 07/24/2010   Subjective Data: Depression, suicidal ideation, homicidal ideation, substance abuse.  Continued Clinical Symptoms:  Alcohol Use Disorder Identification Test Final Score (AUDIT): 4 The "Alcohol Use Disorders Identification Test", Guidelines for Use in Primary Care, Second Edition.  World [facility]). Score between 0-7:  no or low risk or alcohol related problems. Score between 8-15:  moderate risk of alcohol related problems. Score between 16-19:  high risk of alcohol related problems. Score 20 or above:  warrants further diagnostic evaluation for alcohol dependence and treatment.   CLINICAL FACTORS:   Severe Anxiety and/or Agitation Depression:   Comorbid alcohol abuse/dependence Impulsivity Alcohol/Substance Abuse/Dependencies   Musculoskeletal: Strength & Muscle Tone: within normal limits Gait & Station: normal Patient leans: N/A  Psychiatric Specialty Exam: Physical Exam  Nursing note and vitals reviewed.   Review of Systems  Psychiatric/Behavioral: Positive for depression, suicidal ideas and substance abuse. The patient is nervous/anxious.   All other systems reviewed and are negative.   Blood pressure 142/80, pulse 85, temperature 98.4 F (36.9 C), temperature source Oral, resp. rate 16, height 5\' 11"  (1.803 m), weight 71.215 kg (157 lb), SpO2 100 %.Body mass index is 21.91 kg/(m^2).  General Appearance: Casual  Eye Contact:  Good  Speech:  Clear and Coherent  Volume:  Normal  Mood:  Depressed, Hopeless and Worthless  Affect:  Tearful  Thought Process:  Goal Directed  Orientation:  Full (Time, Place, and Person)  Thought Content:  WDL  Suicidal Thoughts:  Yes.  with intent/plan  Homicidal Thoughts:  Yes.  with intent/plan  Memory:  Immediate;   Fair Recent;   Fair Remote;   Fair  Judgement:  Impaired  Insight:  Present  Psychomotor Activity:  Normal  Concentration:  Concentration: Fair and Attention Span: Fair  Recall:  [employer] of Knowledge:  Fair  Language:  Fair  Akathisia:  No  Handed:  Right  AIMS (if indicated):     Assets:  Communication Skills Desire for Improvement Financial Resources/Insurance Housing Resilience Social Support Transportation  ADL's:  Intact  Cognition:  WNL  Sleep:         COGNITIVE FEATURES THAT CONTRIBUTE TO RISK:  None    SUICIDE RISK:   Moderate:  Frequent suicidal ideation with limited intensity, and duration, some specificity in terms of plans,  no associated intent, good self-control, limited dysphoria/symptomatology, some risk factors present, and identifiable protective factors, including available and  accessible social support.  PLAN OF CARE: Hospital admission, medication management, substance counseling, discharge planning.  [name] is a 55 year old male with a history of depression admitted for making suicidal and homicidal threats while drunk in the context of relationship problems.  1. Suicidal and homicidal ideation. The patient is able to contract for safety in the hospital.  2. Mood. He is not interested in pharmacotherapy. He believes that he takes too many medications for his medical problems already. We will try to offer Remeron for depression.  3. Insomnia. He takes Ambien for sleep.  4. Alcohol abuse. The patient denies drinking on a regular basis. There are no symptoms of withdrawal. Vital signs are stable.  5. History of CVA. He is on aspirin and Plavix.  6. Chronic back pain. He is on call when necessary and Neurontin.  7. Dyslipidemia. He is on Lipitor.  8. Smoking. Nicotine patch is available.  9. Disposition. He will be discharged to home. He will follow up with a psychiatrist in [place].  I certify that inpatient services furnished can reasonably be expected to improve the patient's condition.   Orson Slick, MD 09/20/2015, 4:10 PM

## 2015-09-20 NOTE — BHH Group Notes (Signed)
[facility] Group Notes:  (Nursing/MHT/Case Management/Adjunct)  Date:  09/20/2015  Time:  3:57 PM  Type of Therapy:  Psychoeducational Skills  Participation Level:  Minimal  Participation Quality:  Appropriate  Affect:  Appropriate  Cognitive:  Appropriate  Insight:  Appropriate  Engagement in Group:  None  Modes of Intervention:  Discussion and Education  Summary of Progress/Problems:  [name] 09/20/2015, 3:57 PM

## 2015-09-21 MED ORDER — MIRTAZAPINE 15 MG PO TABS
15.0000 mg | ORAL_TABLET | Freq: Every day | ORAL | Status: DC
  Administered 2015-09-21 – 2015-09-25 (×5): 15 mg via ORAL
  Filled 2015-09-21 (×5): qty 1

## 2015-09-21 NOTE — BHH Group Notes (Signed)
  09/21/2015 2:32 PM  Type of Therapy:  Group Therapy  Participation Level:  Active   Participation Quality:  Attentive  Affect:  Appropriate  Cognitive:  Alert  Insight:  Limited  Engagement in Therapy:  Limited  Modes of Intervention:  Discussion, Education, Socialization and Support  Summary of Progress/Problems: Balance in life: Patients will discuss the concept of balance and how it looks and feels to be unbalanced. Pt will identify areas in their life that is unbalanced and ways to become more balanced. Pt attended group and stayed the entire time. Pt states "I let things go. My mom and grandma died and I just let it go."   [place] MSW, [place]  09/21/2015, 2:32 PM

## 2015-09-21 NOTE — Progress Notes (Signed)
D: Observed pt in dayroom watching TV. Patient alert and oriented x4. Patient denies SI/HI/AVH. Pt affect is irritable and angry.  Pt indicated he's here because "I was going to kill my old lady." Pt denied every wanting to kill himself. Pt still believes his wife is cheating on him, and know's because of "the phone." When asked pt what changed in regards him no longer wanting to kill his wife, pt stated "I just want to get out of here." Pt rated depression 5/10 and anxiety 0/10. Pt interacted minimally and forwarded little.  A: Offered active listening and support. Provided therapeutic communication. Administered scheduled medications. Encouraged pt to attend groups and actively participate in care.  R: Pt cooperative. Pt medication compliant. Will continue Q15 min. checks. Safety maintained.

## 2015-09-21 NOTE — Plan of Care (Signed)
Problem: Health Behavior/Discharge Planning: Goal: Compliance with prescribed medication regimen will improve Outcome: Progressing Pt compliant with medication regiment this shift

## 2015-09-21 NOTE — Progress Notes (Addendum)
[facility] MD Progress Note  09/21/2015 5:21 AM [name]  MRN:  [id number]  Subjective:  [name] was rather tearful and sad yesterday but today he is angry at his wife and unpredictable. The wife has been calling but he is not ready to talk to her. He was uncertain to keep the social worker permission to speak to the family. He is not excited about pharmacotherapy but did accept Remeron night. He is mostly secluded to his room. No group participation. He complains of back pain.  Principal Problem: Major depressive disorder, recurrent severe without psychotic features ([place]) Diagnosis:   Patient Active Problem List   Diagnosis Date Noted  . Intermittent explosive disorder [F63.81] 09/20/2015  . Major depressive disorder, recurrent severe without psychotic features ([place]) [F33.2] 09/20/2015  . Suicidal ideation [R45.851] 09/20/2015  . Homicidal ideation [R45.850] 09/20/2015  . Chest pain at rest [R07.9] 03/19/2013  . Acute non Q wave MI (myocardial infarction), initial episode of care ([place]) [I21.4] 03/06/2013  . Alcohol use disorder, mild, abuse [F10.10] 10/01/2011  . NSTEMI (non-ST elevated myocardial infarction) ([place]) [I21.4] 10/01/2011  . Erectile dysfunction [N52.9] 08/01/2010  . Urge incontinence of urine [N39.41] 08/01/2010  . Chronic alcoholic pancreatitis ([place]) [K86.0] 07/24/2010  . Tobacco use disorder [F17.200] 07/24/2010  . Hypertension [I10] 07/24/2010  . Hyperlipidemia [E78.5] 07/24/2010  . CVA (cerebral vascular accident) ([place]) [I63.9] 07/24/2010  . Chronic back pain [M54.9, G89.29] 07/24/2010   Total Time spent with patient: 20 minutes  Past Psychiatric History: Depression.  Past Medical History:  Past Medical History  Diagnosis Date  . Hypertension   . Stroke ([place])   . Hyperlipidemia   . Depression   . Chronic pancreatitis ([place])   . Substance abuse   . Seizures ([place])   . Myocardial infarction ([place])   . Coronary artery disease     Past Surgical History   Procedure Laterality Date  . Back surgery    . Coronary stent placement    . Left heart catheterization with coronary angiogram N/A 10/01/2011    Procedure: LEFT HEART CATHETERIZATION WITH CORONARY ANGIOGRAM;  Surgeon: Clent Demark, MD;  Location: [facility] CATH LAB;  Service: Cardiovascular;  Laterality: N/A;  . Percutaneous coronary stent intervention (pci-s) N/A 10/02/2011    Procedure: PERCUTANEOUS CORONARY STENT INTERVENTION (PCI-S);  Surgeon: Clent Demark, MD;  Location: [facility] CATH LAB;  Service: Cardiovascular;  Laterality: N/A;  . Left heart catheterization with coronary angiogram N/A 03/06/2013    Procedure: LEFT HEART CATHETERIZATION WITH CORONARY ANGIOGRAM;  Surgeon: Clent Demark, MD;  Location: [facility] CATH LAB;  Service: Cardiovascular;  Laterality: N/A;   Family History: History reviewed. No pertinent family history. Family Psychiatric  History: See H&P. Social History:  History  Alcohol Use  . Yes    Comment: beer every other day     History  Drug Use No    Social History   Social History  . Marital Status: Married    Spouse Name: N/A  . Number of Children: N/A  . Years of Education: N/A   Social History Main Topics  . Smoking status: Current Some Day Smoker -- 0.33 packs/day for 35 years    Types: Cigarettes  . Smokeless tobacco: Never Used     Comment: working on quitting  . Alcohol Use: Yes     Comment: beer every other day  . Drug Use: No  . Sexual Activity: Not Asked   Other Topics Concern  . None   Social History Narrative  The patient lives alone with his significant other, Jonnie Finner.  The patient has a 30 year old daughter.  He is currently unemployed, and  has a high school degree.        He has a history of 1- 1.5 pack per day smoking of many years.  He states he has recently cut down to half pack per day (06/2010).      He has a history of heavy alcohol use, states typically drinking a fifth to a pint of gin daily, after stroke in 06/2010, decreased  to one pint of gin per week, and after hospitalization for acute on chronic pancreatitis, was abstinent for at least 1-2 weeks (from 5/7 to 5/17)        He denies any illicit drug use.    Additional Social History:                         Sleep: Fair  Appetite:  Fair  Current Medications: Current Facility-Administered Medications  Medication Dose Route Frequency Provider Last Rate Last Dose  . acetaminophen (TYLENOL) tablet 650 mg  650 mg Oral Q4H PRN Patrecia Pour, NP      . alum & mag hydroxide-simeth (MAALOX/MYLANTA) 200-200-20 MG/5ML suspension 30 mL  30 mL Oral PRN Patrecia Pour, NP      . aspirin EC tablet 81 mg  81 mg Oral Daily Patrecia Pour, NP      . atorvastatin (LIPITOR) tablet 80 mg  80 mg Oral q1800 Patrecia Pour, NP   80 mg at 09/20/15 1655  . clopidogrel (PLAVIX) tablet 75 mg  75 mg Oral Daily Patrecia Pour, NP      . diclofenac (VOLTAREN) EC tablet 75 mg  75 mg Oral Daily Patrecia Pour, NP      . gabapentin (NEURONTIN) capsule 300 mg  300 mg Oral Daily Patrecia Pour, NP      . ibuprofen (ADVIL,MOTRIN) tablet 600 mg  600 mg Oral Q8H PRN Patrecia Pour, NP      . magnesium hydroxide (MILK OF MAGNESIA) suspension 30 mL  30 mL Oral Daily PRN Patrecia Pour, NP      . mirtazapine (REMERON) tablet 7.5 mg  7.5 mg Oral QHS Ulla Mckiernan B Brandis Wixted, MD   7.5 mg at 09/20/15 2229  . nicotine (NICODERM CQ - dosed in mg/24 hours) patch 21 mg  21 mg Transdermal Daily PRN Patrecia Pour, NP   21 mg at 09/20/15 1622  . zolpidem (AMBIEN) tablet 5 mg  5 mg Oral QHS PRN Patrecia Pour, NP   5 mg at 09/20/15 2229    Lab Results:  Results for orders placed or performed during the hospital encounter of 09/19/15 (from the past 48 hour(s))  Rapid urine drug screen (hospital performed)     Status: None   Collection Time: 09/19/15  9:24 PM  Result Value Ref Range   Opiates NONE DETECTED NONE DETECTED   Cocaine NONE DETECTED NONE DETECTED   Benzodiazepines NONE DETECTED NONE  DETECTED   Amphetamines NONE DETECTED NONE DETECTED   Tetrahydrocannabinol NONE DETECTED NONE DETECTED   Barbiturates NONE DETECTED NONE DETECTED    Comment:        DRUG SCREEN FOR MEDICAL PURPOSES ONLY.  IF CONFIRMATION IS NEEDED FOR ANY PURPOSE, NOTIFY LAB WITHIN 5 DAYS.        LOWEST DETECTABLE LIMITS FOR URINE DRUG SCREEN Drug Class  Cutoff (ng/mL) Amphetamine      1000 Barbiturate      200 Benzodiazepine   329 Tricyclics       191 Opiates          300 Cocaine          300 THC              50   Comprehensive metabolic panel     Status: Abnormal   Collection Time: 09/19/15  9:42 PM  Result Value Ref Range   Sodium 137 135 - 145 mmol/L   Potassium 3.8 3.5 - 5.1 mmol/L   Chloride 102 101 - 111 mmol/L   CO2 24 22 - 32 mmol/L   Glucose, Bld 186 (H) 65 - 99 mg/dL   BUN 13 6 - 20 mg/dL   Creatinine, Ser 1.00 0.61 - 1.24 mg/dL   Calcium 9.5 8.9 - 10.3 mg/dL   Total Protein 8.4 (H) 6.5 - 8.1 g/dL   Albumin 5.1 (H) 3.5 - 5.0 g/dL   AST 22 15 - 41 U/L   ALT 26 17 - 63 U/L   Alkaline Phosphatase 101 38 - 126 U/L   Total Bilirubin 1.0 0.3 - 1.2 mg/dL   GFR calc non Af Amer >60 >60 mL/min   GFR calc Af Amer >60 >60 mL/min    Comment: (NOTE) The eGFR has been calculated using the CKD EPI equation. This calculation has not been validated in all clinical situations. eGFR's persistently <60 mL/min signify possible Chronic Kidney Disease.    Anion gap 11 5 - 15  Ethanol     Status: Abnormal   Collection Time: 09/19/15  9:42 PM  Result Value Ref Range   Alcohol, Ethyl (B) 163 (H) <5 mg/dL    Comment:        LOWEST DETECTABLE LIMIT FOR SERUM ALCOHOL IS 5 mg/dL FOR MEDICAL PURPOSES ONLY   Salicylate level     Status: None   Collection Time: 09/19/15  9:42 PM  Result Value Ref Range   Salicylate Lvl <6.6 2.8 - 30.0 mg/dL  Acetaminophen level     Status: Abnormal   Collection Time: 09/19/15  9:42 PM  Result Value Ref Range   Acetaminophen (Tylenol), Serum <10 (L) 10 -  30 ug/mL    Comment:        THERAPEUTIC CONCENTRATIONS VARY SIGNIFICANTLY. A RANGE OF 10-30 ug/mL MAY BE AN EFFECTIVE CONCENTRATION FOR MANY PATIENTS. HOWEVER, SOME ARE BEST TREATED AT CONCENTRATIONS OUTSIDE THIS RANGE. ACETAMINOPHEN CONCENTRATIONS >150 ug/mL AT 4 HOURS AFTER INGESTION AND >50 ug/mL AT 12 HOURS AFTER INGESTION ARE OFTEN ASSOCIATED WITH TOXIC REACTIONS.   cbc     Status: None   Collection Time: 09/19/15  9:42 PM  Result Value Ref Range   WBC 9.8 4.0 - 10.5 K/uL   RBC 4.88 4.22 - 5.81 MIL/uL   Hemoglobin 14.3 13.0 - 17.0 g/dL   HCT 41.6 39.0 - 52.0 %   MCV 85.2 78.0 - 100.0 fL   MCH 29.3 26.0 - 34.0 pg   MCHC 34.4 30.0 - 36.0 g/dL   RDW 13.2 11.5 - 15.5 %   Platelets 290 150 - 400 K/uL    Blood Alcohol level:  Lab Results  Component Value Date   [facility] 163* 09/19/2015   ETH <11 06/00/4599    Metabolic Disorder Labs: Lab Results  Component Value Date   HGBA1C 6.4* 03/19/2013   MPG 137* 03/19/2013   MPG 131* 03/06/2013   No results  found for: PROLACTIN Lab Results  Component Value Date   CHOL 134 03/20/2013   TRIG 51 03/20/2013   HDL 49 03/20/2013   CHOLHDL 2.7 03/20/2013   VLDL 10 03/20/2013   LDLCALC 75 03/20/2013   LDLCALC 127* 07/29/2012    Physical Findings: AIMS:  , ,  ,  ,    CIWA:    COWS:     Musculoskeletal: Strength & Muscle Tone: within normal limits Gait & Station: normal Patient leans: N/A  Psychiatric Specialty Exam: Physical Exam  Nursing note and vitals reviewed.   Review of Systems  Musculoskeletal: Positive for back pain.  Psychiatric/Behavioral: Positive for depression, suicidal ideas and substance abuse.  All other systems reviewed and are negative.   Blood pressure 142/80, pulse 85, temperature 98.4 F (36.9 C), temperature source Oral, resp. rate 16, height 5' 11"  (1.803 m), weight 71.215 kg (157 lb), SpO2 100 %.Body mass index is 21.91 kg/(m^2).  General Appearance: Casual  Eye Contact:  Good  Speech:   Clear and Coherent  Volume:  Normal  Mood:  Angry, Dysphoric and Irritable  Affect:  Congruent  Thought Process:  Goal Directed  Orientation:  Full (Time, Place, and Person)  Thought Content:  Illogical  Suicidal Thoughts:  Yes.  with intent/plan  Homicidal Thoughts:  No  Memory:  Immediate;   Fair Recent;   Fair Remote;   Fair  Judgement:  Poor  Insight:  Lacking  Psychomotor Activity:  Psychomotor Retardation  Concentration:  Concentration: Fair and Attention Span: Fair  Recall:  [employer] of Knowledge:  Fair  Language:  Fair  Akathisia:  No  Handed:  Right  AIMS (if indicated):     Assets:  Communication Skills Desire for Improvement Financial Resources/Insurance Housing Physical Health Resilience  ADL's:  Intact  Cognition:  WNL  Sleep:        Treatment Plan Summary: Daily contact with patient to assess and evaluate symptoms and progress in treatment and Medication management   [name] is a 55 year old male with a history of depression admitted for making suicidal and homicidal threats while drunk in the context of relationship problems.  1. Suicidal and homicidal ideation. The patient is able to contract for safety in the hospital.  2. Mood. He is not interested in pharmacotherapy. He believes that he takes too many medications for his medical problems already. We will increase Remeron to 15 mg for depression.  3. Insomnia. He takes Ambien for sleep.  4. Alcohol abuse. The patient denies drinking on a regular basis. There are no symptoms of withdrawal. Vital signs are stable.  5. History of CVA. He is on aspirin and Plavix.  6. Chronic back pain. He is on call when necessary and Neurontin.  7. Dyslipidemia. He is on Lipitor.  8. Smoking. Nicotine patch is available.  9. Disposition. He will be discharged to home. He will follow up with a psychiatrist in [place].  Orson Slick, MD 09/21/2015, 5:21 AM

## 2015-09-21 NOTE — BHH Counselor (Signed)
Adult Comprehensive Assessment  Patient ID: [name], male   DOB: [birth year], 55 y.o.   MRN: [id number]  Information Source: Information source: Patient  Current Stressors:  Educational / Learning stressors: Pt did not finish high school.  Employment / Job issues: Engineer, agricultural.  Family Relationships: Strained relationship with wife and daughter.  Financial / Lack of resources (include bankruptcy): Limited income.  Housing / Lack of housing: Pt lives with wife and daughter  Physical health (include injuries & life threatening diseases): Pt has back and heart problems  Social relationships: None reported  Substance abuse: Pt reports drinking 1-2 beers per day.  Bereavement / Loss: None reported   Living/Environment/Situation:  Living Arrangements: Spouse/significant other Living conditions (as described by patient or guardian): Pt reports wife is cheating on him.  How long has patient lived in current situation?: "few years" What is atmosphere in current home: Chaotic  Family History:  Marital status: Married Number of Years Married: 29 What types of issues is patient dealing with in the relationship?: "She is a Occupational psychologist"  Additional relationship information: He plans to file for separation upon discharge.  Are you sexually active?: Yes What is your sexual orientation?: Heterosexual  Has your sexual activity been affected by drugs, alcohol, medication, or emotional stress?: None reported  Does patient have children?: Yes How many children?: 1 How is patient's relationship with their children?: 29 year odl daughter; strained relationship   Childhood History:  By whom was/is the patient raised?: Mother, Grandparents Description of patient's relationship with caregiver when they were a child: "alright" relationship.  Patient's description of current relationship with people who raised him/her: Mother and grandmother have passed away.  How were you disciplined when you  got in trouble as a child/adolescent?: None reported  Does patient have siblings?: No Did patient suffer any verbal/emotional/physical/sexual abuse as a child?: No Did patient suffer from severe childhood neglect?: No Has patient ever been sexually abused/assaulted/raped as an adolescent or adult?: No Was the patient ever a victim of a crime or a disaster?: No Witnessed domestic violence?: No Has patient been effected by domestic violence as an adult?: No  Education:  Highest grade of school patient has completed: 11th Currently a student?: No Learning disability?: No  Employment/Work Situation:   Employment situation: On disability Why is patient on disability: back and heart problems  How long has patient been on disability: 9 years Patient's job has been impacted by current illness: No What is the longest time patient has a held a job?: 11 years Where was the patient employed at that time?: [employer]  Has patient ever been in the [employer]?: Yes (Describe in comment) Education officer, community ) Has patient ever served in combat?: Yes Patient description of combat service: [place] Did You Receive Any Psychiatric Treatment/Services While in the [employer]?: No Are There Guns or Other Weapons in [place]?: Yes Types of Guns/Weapons: hand gun  Are These Weapons Safely Secured?: No Who Could Verify You Are Able To Have These Secured:: the gun cannot be located.   Financial Resources:   Financial resources: [employer] Does patient have a Programmer, applications or guardian?: No  Alcohol/Substance Abuse:   What has been your use of drugs/alcohol within the last 12 months?: 1-2 beers per day.  If attempted suicide, did drugs/alcohol play a role in this?: No Alcohol/Substance Abuse Treatment Hx: Denies past history Has alcohol/substance abuse ever caused legal problems?: No  Social Support System:   Patient's Community Support System: Poor Describe  Community Support System: family, [place] Type of  faith/religion: NA How does patient's faith help to cope with current illness?: NA  Leisure/Recreation:   Leisure and Hobbies: "nothing"   Strengths/Needs:   What things does the patient do well?: refused to answer  In what areas does patient struggle / problems for patient: relationship with wife.   Discharge Plan:   Does patient have access to transportation?: Yes Will patient be returning to same living situation after discharge?: Yes Currently receiving community mental health services: No (Connected to [place] for medical services) If no, would patient like referral for services when discharged?: No Does patient have financial barriers related to discharge medications?: No  Summary/Recommendations:    Patient is a 55 year old male admitted  with a diagnosis of Major Depression. Patient presented to the hospital with SI, HI and substance use. Patient reports primary triggers for admission were relationship with wife and alcohol use. Patient will benefit from crisis stabilization, medication evaluation, group therapy and psycho education in addition to case management for discharge. At discharge, it is recommended that patient remain compliant with established discharge plan and continued treatment.   [place]. MSW, [facility]  09/21/2015

## 2015-09-21 NOTE — Plan of Care (Signed)
Problem: Safety: Goal: Periods of time without injury will increase Outcome: Progressing Patient is ambulating, Patient alert and oriented, no s/s of distress.

## 2015-09-21 NOTE — BHH Suicide Risk Assessment (Addendum)
[place] INPATIENT:  Family/Significant Other Suicide Prevention Education  Suicide Prevention Education:  Education Completed;[name] (wife) has been identified by the patient as the family member/significant other with whom the patient will be residing, and identified as the person(s) who will aid the patient in the event of a mental health crisis (suicidal ideations/suicide attempt).  With written consent from the patient, the family member/significant other has been provided the following suicide prevention education, prior to the and/or following the discharge of the patient.  The suicide prevention education provided includes the following:  Suicide risk factors  Suicide prevention and interventions  National Suicide Hotline telephone number  [facility] assessment telephone number  [facility] Emergency Assistance [place] and/or Residential Mobile Crisis Unit telephone number  Request made of family/significant other to:  Remove weapons (e.g., guns, rifles, knives), all items previously/currently identified as safety concern.    Remove drugs/medications (over-the-counter, prescriptions, illicit drugs), all items previously/currently identified as a safety concern.  The family member/significant other verbalizes understanding of the suicide prevention education information provided.  The family member/significant other agrees to remove the items of safety concern listed above. Pt's wife states pt owns a gun but she cannot find it. The police have searched for the gun as well. She states she feels safe with patient returning home.   [place] MSW, [place]  09/21/2015, 3:05 PM

## 2015-09-22 MED ORDER — OXCARBAZEPINE 150 MG PO TABS
300.0000 mg | ORAL_TABLET | Freq: Two times a day (BID) | ORAL | Status: DC
  Administered 2015-09-22 – 2015-09-26 (×9): 300 mg via ORAL
  Filled 2015-09-22 (×9): qty 2

## 2015-09-22 MED ORDER — RISPERIDONE 1 MG PO TABS
2.0000 mg | ORAL_TABLET | Freq: Two times a day (BID) | ORAL | Status: DC
  Administered 2015-09-22 – 2015-09-23 (×3): 2 mg via ORAL
  Filled 2015-09-22 (×3): qty 2

## 2015-09-22 NOTE — BHH Group Notes (Signed)
BHH LCSW Group Therapy  09/22/2015 2:04 PM  Type of Therapy:  Group Therapy  Participation Level:  Did Not Attend  Modes of Intervention:  Discussion, Education, Socialization and Support  Summary of Progress/Problems: Pt will identify unhealthy thoughts and how they impact their emotions and behavior. Pt will be encouraged to discuss these thoughts, emotions and behaviors with the group.   Anjeli Casad L Mariadelaluz [place] MSW, LCSWA  09/22/2015, 2:04 PM  

## 2015-09-22 NOTE — Progress Notes (Signed)
Rally spent most of the shift resting in bed, he had minimal interaction with peers and staff. He denied any hallucinations on shift and he was medication compliant. He appears to be resting in bed quietly at this time.

## 2015-09-22 NOTE — BHH Group Notes (Signed)
[place] Group Notes:  (Nursing/MHT/Case Management/Adjunct)  Date:  09/22/2015  Time:  2:45 AM  Type of Therapy:  Psychoeducational Skills  Participation Level:  Minimal  Participation Quality:  Attentive  Affect:  Appropriate  Cognitive:  Alert  Insight:  None  Engagement in Group:  None  Modes of Intervention:  Discussion, Socialization and Support  Summary of Progress/Problems: Patient was asked to join the group discussion but he declined. He just sat in group quietly and listened.  Reece Agar 09/22/2015, 2:45 AM

## 2015-09-22 NOTE — Progress Notes (Signed)
D:  Per pt self inventory pt reports sleeping good, appetite good, energy level normal, ability to pay attention good, rates depression at a 6 out of 10, hopelessness at a 0 out of 10, anxiety at a 0 out of 10, denies SI/HI/AVH, goal today: "nothing", irritable during interaction, "I am a fuck that type person", pt reports that he has been dealing with anger and irritable mood.     A:  Emotional support provided, Encouraged pt to continue with treatment plan and attend all group activities, q15 min checks maintained for safety.  R:  Pt is not receptive, not going to groups, isolates in room, med compliant, educated pt on trileptal and risperdal--pt verbalized understanding.

## 2015-09-22 NOTE — Progress Notes (Signed)
[facility] MD Progress Note  09/22/2015 11:14 AM [name]  MRN:  [id number]  Subjective:  [name] is laud and agitated today. He continues to threaten his wife. We learned that the patient has been paranoid for several years now. He has been accusing his wife over unfaithfulness, watching her, and harassing her at work. There is a restraining order for him at her workplace. On the night of admission that was a standoff with the police. His gun has never been found. Social worker spoke with the wife who is not afraid of the patient and is happy for him to return home and believing that "God takes care of her". The patient initially presented depressed, tearful, remorseful. Yesterday he threatened to kill his wife when talking to the nurse. Today he is agitated, unreasonable, cursing loudly.   Principal Problem: Major depressive disorder, recurrent severe without psychotic features ([place]) Diagnosis:   Patient Active Problem List   Diagnosis Date Noted  . Intermittent explosive disorder [F63.81] 09/20/2015  . Major depressive disorder, recurrent severe without psychotic features ([place]) [F33.2] 09/20/2015  . Suicidal ideation [R45.851] 09/20/2015  . Homicidal ideation [R45.850] 09/20/2015  . Chest pain at rest [R07.9] 03/19/2013  . Acute non Q wave MI (myocardial infarction), initial episode of care ([place]) [I21.4] 03/06/2013  . Alcohol use disorder, mild, abuse [F10.10] 10/01/2011  . NSTEMI (non-ST elevated myocardial infarction) ([place]) [I21.4] 10/01/2011  . Erectile dysfunction [N52.9] 08/01/2010  . Urge incontinence of urine [N39.41] 08/01/2010  . Chronic alcoholic pancreatitis ([place]) [K86.0] 07/24/2010  . Tobacco use disorder [F17.200] 07/24/2010  . Hypertension [I10] 07/24/2010  . Hyperlipidemia [E78.5] 07/24/2010  . CVA (cerebral vascular accident) ([place]) [I63.9] 07/24/2010  . Chronic back pain [M54.9, G89.29] 07/24/2010   Total Time spent with patient: 20 minutes  Past Psychiatric  History: Depression and psychosis.  Past Medical History:  Past Medical History  Diagnosis Date  . Hypertension   . Stroke ([place])   . Hyperlipidemia   . Depression   . Chronic pancreatitis ([place])   . Substance abuse   . Seizures ([place])   . Myocardial infarction ([place])   . Coronary artery disease     Past Surgical History  Procedure Laterality Date  . Back surgery    . Coronary stent placement    . Left heart catheterization with coronary angiogram N/A 10/01/2011    Procedure: LEFT HEART CATHETERIZATION WITH CORONARY ANGIOGRAM;  Surgeon: Clent Demark, MD;  Location: [facility] CATH LAB;  Service: Cardiovascular;  Laterality: N/A;  . Percutaneous coronary stent intervention (pci-s) N/A 10/02/2011    Procedure: PERCUTANEOUS CORONARY STENT INTERVENTION (PCI-S);  Surgeon: Clent Demark, MD;  Location: [facility] CATH LAB;  Service: Cardiovascular;  Laterality: N/A;  . Left heart catheterization with coronary angiogram N/A 03/06/2013    Procedure: LEFT HEART CATHETERIZATION WITH CORONARY ANGIOGRAM;  Surgeon: Clent Demark, MD;  Location: [facility] CATH LAB;  Service: Cardiovascular;  Laterality: N/A;   Family History: History reviewed. No pertinent family history. Family Psychiatric  History: See H&P. Social History:  History  Alcohol Use  . Yes    Comment: beer every other day     History  Drug Use No    Social History   Social History  . Marital Status: Married    Spouse Name: N/A  . Number of Children: N/A  . Years of Education: N/A   Social History Main Topics  . Smoking status: Current Some Day Smoker -- 0.33 packs/day for 35 years  Types: Cigarettes  . Smokeless tobacco: Never Used     Comment: working on quitting  . Alcohol Use: Yes     Comment: beer every other day  . Drug Use: No  . Sexual Activity: Not Asked   Other Topics Concern  . None   Social History Narrative   The patient lives alone with his significant other, [name].  The patient has a 77 year old daughter.   He is currently unemployed, and  has a high school degree.        He has a history of 1- 1.5 pack per day smoking of many years.  He states he has recently cut down to half pack per day (06/2010).      He has a history of heavy alcohol use, states typically drinking a fifth to a pint of gin daily, after stroke in 06/2010, decreased to one pint of gin per week, and after hospitalization for acute on chronic pancreatitis, was abstinent for at least 1-2 weeks (from 5/7 to 5/17)        He denies any illicit drug use.    Additional Social History:                         Sleep: Fair  Appetite:  Fair  Current Medications: Current Facility-Administered Medications  Medication Dose Route Frequency Provider Last Rate Last Dose  . acetaminophen (TYLENOL) tablet 650 mg  650 mg Oral Q4H PRN Patrecia Pour, NP      . alum & mag hydroxide-simeth (MAALOX/MYLANTA) 200-200-20 MG/5ML suspension 30 mL  30 mL Oral PRN Patrecia Pour, NP      . aspirin EC tablet 81 mg  81 mg Oral Daily Patrecia Pour, NP   81 mg at 09/22/15 0825  . atorvastatin (LIPITOR) tablet 80 mg  80 mg Oral q1800 Patrecia Pour, NP   80 mg at 09/21/15 1706  . clopidogrel (PLAVIX) tablet 75 mg  75 mg Oral Daily Patrecia Pour, NP   75 mg at 09/22/15 0825  . diclofenac (VOLTAREN) EC tablet 75 mg  75 mg Oral Daily Patrecia Pour, NP   75 mg at 09/22/15 0825  . gabapentin (NEURONTIN) capsule 300 mg  300 mg Oral Daily Patrecia Pour, NP   300 mg at 09/22/15 0826  . ibuprofen (ADVIL,MOTRIN) tablet 600 mg  600 mg Oral Q8H PRN Patrecia Pour, NP      . magnesium hydroxide (MILK OF MAGNESIA) suspension 30 mL  30 mL Oral Daily PRN Patrecia Pour, NP      . mirtazapine (REMERON) tablet 15 mg  15 mg Oral QHS Clovis Fredrickson, MD   15 mg at 09/21/15 2138  . nicotine (NICODERM CQ - dosed in mg/24 hours) patch 21 mg  21 mg Transdermal Daily PRN Patrecia Pour, NP   21 mg at 09/20/15 1622  . zolpidem (AMBIEN) tablet 5 mg  5 mg Oral QHS PRN  Patrecia Pour, NP   5 mg at 09/20/15 2229    Lab Results: No results found for this or any previous visit (from the past 62 hour(s)).  Blood Alcohol level:  Lab Results  Component Value Date   [facility] 163* 09/19/2015   ETH <11 [id number]    Metabolic Disorder Labs: Lab Results  Component Value Date   HGBA1C 6.4* 03/19/2013   MPG 137* 03/19/2013   MPG 131* 03/06/2013   No results found for: PROLACTIN  Lab Results  Component Value Date   CHOL 134 03/20/2013   TRIG 51 03/20/2013   HDL 49 03/20/2013   CHOLHDL 2.7 03/20/2013   VLDL 10 03/20/2013   LDLCALC 75 03/20/2013   LDLCALC 127* 07/29/2012    Physical Findings: AIMS:  , ,  ,  ,    CIWA:    COWS:     Musculoskeletal: Strength & Muscle Tone: within normal limits Gait & Station: normal Patient leans: N/A  Psychiatric Specialty Exam: Physical Exam  Nursing note and vitals reviewed.   Review of Systems  Psychiatric/Behavioral: Positive for depression, suicidal ideas and hallucinations.  All other systems reviewed and are negative.   Blood pressure 126/79, pulse 74, temperature 98.1 F (36.7 C), temperature source Oral, resp. rate 18, height 5\' 11"  (1.803 m), weight 71.215 kg (157 lb), SpO2 100 %.Body mass index is 21.91 kg/(m^2).  General Appearance: Casual  Eye Contact:  Fair  Speech:  Pressured  Volume:  Increased  Mood:  Angry, Dysphoric and Irritable  Affect:  Congruent and Labile  Thought Process:  Disorganized  Orientation:  Full (Time, Place, and Person)  Thought Content:  Delusions and Paranoid Ideation  Suicidal Thoughts:  No  Homicidal Thoughts:  Yes.  with intent/plan  Memory:  Immediate;   Fair Recent;   Fair Remote;   Fair  Judgement:  Poor  Insight:  Lacking  Psychomotor Activity:  Increased  Concentration:  Concentration: Fair and Attention Span: Fair  Recall:  [employer] of Knowledge:  Fair  Language:  Fair  Akathisia:  Yes  Handed:  Right  AIMS (if indicated):     Assets:   Communication Skills Desire for Improvement Financial Resources/Insurance Housing Physical Health Resilience Social Support  ADL's:  Intact  Cognition:  WNL  Sleep:  Number of Hours: 8.5     Treatment Plan Summary: Daily contact with patient to assess and evaluate symptoms and progress in treatment and Medication management   [name] is a 55 year old male with a history of depression admitted for making suicidal and homicidal threats while drunk in the context of relationship problems.  1. Suicidal and homicidal ideation. The patient still voices homicidal threats but is able to contract for safety in the hospital.  2. Mood. We started Remeron for depression and it is clear that he needs antipsychotic and a mood stabilizer. We'll start Risperdal for psychosis and Trileptal for mood stabilization.  3. Insomnia. He takes Ambien for sleep.  4. Alcohol abuse. The patient denies drinking on a regular basis. There are no symptoms of withdrawal. Vital signs are stable.  5. History of CVA. He is on aspirin and Plavix.  6. Chronic back pain. He is on call when necessary and Neurontin.  7. Dyslipidemia. He is on Lipitor.  8. Smoking. Nicotine patch is available.  9. Disposition. He will be discharged to home. He will follow up with a psychiatrist in [place]. The patient refuses any follow-up.   Orson Slick, MD 09/22/2015, 11:14 AM

## 2015-09-23 MED ORDER — RISPERIDONE 3 MG PO TABS
3.0000 mg | ORAL_TABLET | Freq: Two times a day (BID) | ORAL | Status: DC
  Administered 2015-09-23 – 2015-09-26 (×6): 3 mg via ORAL
  Filled 2015-09-23 (×7): qty 1

## 2015-09-23 NOTE — BHH Group Notes (Signed)
[place] Group Notes:  (Nursing/MHT/Case Management/Adjunct)  Date:  09/23/2015  Time:  9:19 PM  Type of Therapy:  Group Therapy  Participation Level:  Active  Participation Quality:  Appropriate  Affect:  Appropriate  Cognitive:  Appropriate  Insight:  Appropriate  Engagement in Group:  Engaged  Modes of Intervention:  Discussion  Summary of Progress/Problems:  [name] 09/23/2015, 9:19 PM

## 2015-09-23 NOTE — Progress Notes (Addendum)
Patient with depressed affect, sullen. Withdrawn and guarded behavior when staff initiates interaction. Guarded, denies SI/HI at this time. Completes daily audit sheet with get ahead as his daily goal. Remains hostile, completing daily audit sheet stating he would like to tell staff, "F### you". Therapy groups encouraged to voice thoughts and feelings and MD and SW aware and  Notified. Night shift reports to writer patient is suspicious of them during safety rounds at night.  Safety maintained.

## 2015-09-23 NOTE — Plan of Care (Signed)
Problem: Coping: Goal: Ability to verbalize feelings will improve Outcome: Not Progressing Patient is guarded with information and unwilling to speak with staff.

## 2015-09-23 NOTE — BHH Group Notes (Signed)
[place] Group Notes:  (Nursing/MHT/Case Management/Adjunct)  Date:  09/23/2015  Time:  5:34 PM  Type of Therapy:  Psychoeducational Skills  Participation Level:  Minimal  Participation Quality:  Attentive  Affect:  Irritable  Cognitive:  Appropriate  Insight:  Lacking  Engagement in Group:  Poor  Modes of Intervention:  Activity, Discussion and Education  Summary of Progress/Problems:  [name] 09/23/2015, 5:34 PM

## 2015-09-23 NOTE — Progress Notes (Signed)
D: Pt denies SI/HI/AVH. Pt is angry,irritable,hostile towards staff and unwilling to participate in treatment plan. Pt is not  interacting with peers and staff appropriately.  A: Pt was offered support and encouragement. Pt was given scheduled medications. Pt was encouraged to attend groups. Q 15 minute checks were done for safety.  R:Pt did not attend evening group.Marland Kitchen Pt is complaint with medication. Pt has no complaints.Pt receptive to treatment and safety maintained on unit.

## 2015-09-23 NOTE — BHH Group Notes (Signed)
[place] LCSW Group Therapy   09/23/2015 9:30am Type of Therapy: Group Therapy   Participation Level: Active   Participation Quality: Attentive, Sharing and Supportive   Affect: Depressed and Flat   Cognitive: Alert and Oriented   Insight: Developing/Improving and Engaged   Engagement in Therapy: Developing/Improving and Engaged   Modes of Intervention: Clarification, Confrontation, Discussion, Education, Exploration,  Limit-setting, Orientation, Problem-solving, Rapport Building, Art therapist, Socialization and Support   Summary of Progress/Problems: Pt identified obstacles faced currently and processed barriers involved in overcoming these obstacles. Pt identified steps necessary for overcoming these obstacles and explored motivation (internal and external) for facing these difficulties head on. Pt further identified one area of concern in their lives and chose a goal to focus on for today. Pt presented as hostile and initially only shared in one-word responses.  Pt shared the pt felt the pt had no obstacles to the pt's goal.  Pt shared the pt intended to get in his car, and drive away.  Pt shared he just wants to "get away".  Pt shared the pt was looking forward to discharge in order to mitigate the rising cost of his hospital stay.  Pt was polite and cooperative with the CSW and other group members and focused and attentive to the topics discussed and the sharing of others.  [name]. Camille Dragan, LCSWA, LCAS

## 2015-09-23 NOTE — Progress Notes (Addendum)
[facility] MD Progress Note  09/23/2015 4:07 AM [name]  MRN:  [id number]  Subjective:  [name] still voices homicidal threats, is irritable, easily agitated, and uses foul language. He did take his medications as prescribed. He does not interact with peers and interacts inappropriately with staff.   Principal Problem: Major depressive disorder, recurrent severe without psychotic features ([place]) Diagnosis:   Patient Active Problem List   Diagnosis Date Noted  . Intermittent explosive disorder [F63.81] 09/20/2015  . Major depressive disorder, recurrent severe without psychotic features ([place]) [F33.2] 09/20/2015  . Suicidal ideation [R45.851] 09/20/2015  . Homicidal ideation [R45.850] 09/20/2015  . Chest pain at rest [R07.9] 03/19/2013  . Acute non Q wave MI (myocardial infarction), initial episode of care ([place]) [I21.4] 03/06/2013  . Alcohol use disorder, mild, abuse [F10.10] 10/01/2011  . NSTEMI (non-ST elevated myocardial infarction) ([place]) [I21.4] 10/01/2011  . Erectile dysfunction [N52.9] 08/01/2010  . Urge incontinence of urine [N39.41] 08/01/2010  . Chronic alcoholic pancreatitis ([place]) [K86.0] 07/24/2010  . Tobacco use disorder [F17.200] 07/24/2010  . Hypertension [I10] 07/24/2010  . Hyperlipidemia [E78.5] 07/24/2010  . CVA (cerebral vascular accident) ([place]) [I63.9] 07/24/2010  . Chronic back pain [M54.9, G89.29] 07/24/2010   Total Time spent with patient: 20 minutes  Past Psychiatric History: Depression  Past Medical History:  Past Medical History  Diagnosis Date  . Hypertension   . Stroke ([place])   . Hyperlipidemia   . Depression   . Chronic pancreatitis ([place])   . Substance abuse   . Seizures ([place])   . Myocardial infarction ([place])   . Coronary artery disease     Past Surgical History  Procedure Laterality Date  . Back surgery    . Coronary stent placement    . Left heart catheterization with coronary angiogram N/A 10/01/2011    Procedure: LEFT HEART  CATHETERIZATION WITH CORONARY ANGIOGRAM;  Surgeon: Clent Demark, MD;  Location: [facility] CATH LAB;  Service: Cardiovascular;  Laterality: N/A;  . Percutaneous coronary stent intervention (pci-s) N/A 10/02/2011    Procedure: PERCUTANEOUS CORONARY STENT INTERVENTION (PCI-S);  Surgeon: Clent Demark, MD;  Location: [facility] CATH LAB;  Service: Cardiovascular;  Laterality: N/A;  . Left heart catheterization with coronary angiogram N/A 03/06/2013    Procedure: LEFT HEART CATHETERIZATION WITH CORONARY ANGIOGRAM;  Surgeon: Clent Demark, MD;  Location[facility] CATH LAB;  Service: Cardiovascular;  Laterality: N/A;   Family History: History reviewed. No pertinent family history. Family Psychiatric  History: See H&P. Social History:  History  Alcohol Use  . Yes    Comment: beer every other day     History  Drug Use No    Social History   Social History  . Marital Status: Married    Spouse Name: N/A  . Number of Children: N/A  . Years of Education: N/A   Social History Main Topics  . Smoking status: Current Some Day Smoker -- 0.33 packs/day for 35 years    Types: Cigarettes  . Smokeless tobacco: Never Used     Comment: working on quitting  . Alcohol Use: Yes     Comment: beer every other day  . Drug Use: No  . Sexual Activity: Not Asked   Other Topics Concern  . None   Social History Narrative   The patient lives alone with his significant other, [name].  The patient has a 41 year old daughter.  He is currently unemployed, and  has a high school degree.        He  has a history of 1- 1.5 pack per day smoking of many years.  He states he has recently cut down to half pack per day (06/2010).      He has a history of heavy alcohol use, states typically drinking a fifth to a pint of gin daily, after stroke in 06/2010, decreased to one pint of gin per week, and after hospitalization for acute on chronic pancreatitis, was abstinent for at least 1-2 weeks (from 5/7 to 5/17)        He denies any  illicit drug use.    Additional Social History:                         Sleep: Fair  Appetite:  Fair  Current Medications: Current Facility-Administered Medications  Medication Dose Route Frequency Provider Last Rate Last Dose  . acetaminophen (TYLENOL) tablet 650 mg  650 mg Oral Q4H PRN Patrecia Pour, NP      . alum & mag hydroxide-simeth (MAALOX/MYLANTA) 200-200-20 MG/5ML suspension 30 mL  30 mL Oral PRN Patrecia Pour, NP      . aspirin EC tablet 81 mg  81 mg Oral Daily Patrecia Pour, NP   81 mg at 09/22/15 0825  . atorvastatin (LIPITOR) tablet 80 mg  80 mg Oral q1800 Patrecia Pour, NP   80 mg at 09/21/15 1706  . clopidogrel (PLAVIX) tablet 75 mg  75 mg Oral Daily Patrecia Pour, NP   75 mg at 09/22/15 0825  . diclofenac (VOLTAREN) EC tablet 75 mg  75 mg Oral Daily Patrecia Pour, NP   75 mg at 09/22/15 0825  . gabapentin (NEURONTIN) capsule 300 mg  300 mg Oral Daily Patrecia Pour, NP   300 mg at 09/22/15 0826  . ibuprofen (ADVIL,MOTRIN) tablet 600 mg  600 mg Oral Q8H PRN Patrecia Pour, NP      . magnesium hydroxide (MILK OF MAGNESIA) suspension 30 mL  30 mL Oral Daily PRN Patrecia Pour, NP      . mirtazapine (REMERON) tablet 15 mg  15 mg Oral QHS Sagan Wurzel B Sierra Bissonette, MD   15 mg at 09/22/15 2300  . nicotine (NICODERM CQ - dosed in mg/24 hours) patch 21 mg  21 mg Transdermal Daily PRN Patrecia Pour, NP   21 mg at 09/20/15 1622  . OXcarbazepine (TRILEPTAL) tablet 300 mg  300 mg Oral BID Aime Meloche B Jaliya Siegmann, MD   300 mg at 09/22/15 2300  . risperiDONE (RISPERDAL) tablet 2 mg  2 mg Oral BID Morris Markham B Riyan Gavina, MD   2 mg at 09/22/15 2300  . zolpidem (AMBIEN) tablet 5 mg  5 mg Oral QHS PRN Patrecia Pour, NP   5 mg at 09/20/15 2229    Lab Results: No results found for this or any previous visit (from the past 36 hour(s)).  Blood Alcohol level:  Lab Results  Component Value Date   [facility] 163* 09/19/2015   ETH <11 [id number]    Metabolic Disorder Labs: Lab Results   Component Value Date   HGBA1C 6.4* 03/19/2013   MPG 137* 03/19/2013   MPG 131* 03/06/2013   No results found for: PROLACTIN Lab Results  Component Value Date   CHOL 134 03/20/2013   TRIG 51 03/20/2013   HDL 49 03/20/2013   CHOLHDL 2.7 03/20/2013   VLDL 10 03/20/2013   LDLCALC 75 03/20/2013   LDLCALC 127* 07/29/2012    Physical Findings: AIMS:  , ,  ,  ,  CIWA:    COWS:     Musculoskeletal: Strength & Muscle Tone: within normal limits Gait & Station: normal Patient leans: N/A  Psychiatric Specialty Exam: Physical Exam  Nursing note and vitals reviewed. Skin: Rash noted.    Review of Systems  Psychiatric/Behavioral: Positive for depression, suicidal ideas and hallucinations.  All other systems reviewed and are negative.   Blood pressure 126/79, pulse 74, temperature 98.1 F (36.7 C), temperature source Oral, resp. rate 18, height 5\' 11"  (1.803 m), weight 71.215 kg (157 lb), SpO2 100 %.Body mass index is 21.91 kg/(m^2).  General Appearance: Casual  Eye Contact:  None  Speech:  Pressured  Volume:  Increased  Mood:  Angry, Dysphoric and Irritable  Affect:  Congruent  Thought Process:  Disorganized  Orientation:  Full (Time, Place, and Person)  Thought Content:  Delusions and Paranoid Ideation  Suicidal Thoughts:  Yes.  with intent/plan  Homicidal Thoughts:  Yes.  with intent/plan  Memory:  Immediate;   Fair Recent;   Fair Remote;   Fair  Judgement:  Poor  Insight:  Lacking  Psychomotor Activity:  Decreased  Concentration:  Concentration: Fair and Attention Span: Fair  Recall:  [employer] of Knowledge:  Fair  Language:  Fair  Akathisia:  No  Handed:  Right  AIMS (if indicated):     Assets:  Communication Skills Desire for Improvement Financial Resources/Insurance Housing Resilience Social Support  ADL's:  Intact  Cognition:  WNL  Sleep:  Number of Hours: 8.5     Treatment Plan Summary: Daily contact with patient to assess and evaluate symptoms  and progress in treatment and Medication management   [name] is a 55 year old male with a history of depression admitted for making suicidal and homicidal threats while drunk in the context of relationship problems.  1. Suicidal and homicidal ideation. The patient still voices homicidal threats but is able to contract for safety in the hospital.  2. Mood. We started Remeron for depression, Risperdal for psychosis and Trileptal for mood stabilization. Will increase Risperdal to 3 mg bid.  3. Insomnia. He takes Ambien for sleep.  4. Alcohol abuse. The patient denies drinking on a regular basis. There are no symptoms of withdrawal. Vital signs are stable.  5. History of CVA. He is on aspirin and Plavix.  6. Chronic back pain. He is on call when necessary and Neurontin.  7. Dyslipidemia. He is on Lipitor.  8. Smoking. Nicotine patch is available.  9. Disposition. He will be discharged to home. He will follow up with a psychiatrist in [place]. The patient refuses any follow-up.   Orson Slick, MD 09/23/2015, 4:07 AM

## 2015-09-23 NOTE — Plan of Care (Signed)
Problem: Safety: Goal: Ability to redirect hostility and anger into socially appropriate behaviors will improve Outcome: Not Progressing Patient remains with sullen affect, guarded when staff initiates interactions. States his goal is to get ahead.

## 2015-09-23 NOTE — BHH Group Notes (Signed)
[place] LCSW Group Therapy   09/23/2015 1pm Type of Therapy: Group Therapy   Participation Level: Active   Participation Quality: Attentive, Sharing and Supportive   Affect: Depressed and Flat   Cognitive: Alert and Oriented   Insight: Developing/Improving and Engaged   Engagement in Therapy: Developing/Improving and Engaged   Modes of Intervention: Clarification, Confrontation, Discussion, Education, Exploration,  Limit-setting, Orientation, Problem-solving, Rapport Building, Art therapist, Socialization and Support   Summary of Progress/Problems: Pt identified obstacles faced currently and processed barriers involved in overcoming these obstacles. Pt identified steps necessary for overcoming these obstacles and explored motivation (internal and external) for facing these difficulties head on. Pt further identified one area of concern in their lives and chose a goal to focus on for today. Pt shared the pt hopes to leave the hospital as soon as possible and that the pt considers everyone, including the CSW, that works for the hospital is an obstacle for the pt's recovery. Pt shared the steps that the pt would need to take in order to avoid being hospitalized again and identified that getting in his car and leaving the state is key for the pt to avoid being re-admitted. Pt presents as irritated and angry.   Pt reported he is choosing to focus on avoiding others completely and that the pt believes this will assist the pt in the future. Pt was cooperative with the CSW and other group members and focused and attentive to the topics discussed and the sharing of others.  Pt did smile briefly when the CSW prompted another group member to share what that group member would like to see for the pt in the future.   Alphonse Guild. Moni Rothrock, LCSWA, LCAS

## 2015-09-24 DIAGNOSIS — F29 Unspecified psychosis not due to a substance or known physiological condition: Secondary | ICD-10-CM | POA: Diagnosis present

## 2015-09-24 MED ORDER — NICOTINE 21 MG/24HR TD PT24
21.0000 mg | MEDICATED_PATCH | Freq: Every day | TRANSDERMAL | Status: DC
  Administered 2015-09-24 – 2015-09-26 (×3): 21 mg via TRANSDERMAL
  Filled 2015-09-24 (×3): qty 1

## 2015-09-24 NOTE — Progress Notes (Signed)
[facility] MD Progress Note  09/24/2015 1:45 PM [name]  MRN:  [id number]  Subjective:  Mr. Boggs is not as irritable and contrary as yesterday. He was able to meet with his treatment team without cursing or being upset. He takes medications as prescribed. He told that he spoke with his daughter who encouraged him to cooperate with treatment program so he can be discharged. He apparently spoke with his wife on the phone and she intends to visit him tonight. We learned from the wife that the patient had been paranoid and suspicious with the past several years pointing to psychotic disorder rather than depression with psychotic features. This led to a restraining order that her employer took out to prevent spousal harassment at the workplace.  There are no somatic complaints. Sleep and appetite are fair. He started attending groups and requested information on medication currently given  Principal Problem: Schizophrenia spectrum disorder with psychotic disorder type not yet determined [facility]) Diagnosis:   Patient Active Problem List   Diagnosis Date Noted  . Schizophrenia spectrum disorder with psychotic disorder type not yet determined ([place]) [F20.89, F29] 09/24/2015  . Intermittent explosive disorder [F63.81] 09/20/2015  . Severe recurrent major depressive disorder with psychotic features ([place]) [F33.3] 09/20/2015  . Suicidal ideation [R45.851] 09/20/2015  . Homicidal ideation [R45.850] 09/20/2015  . Chest pain at rest [R07.9] 03/19/2013  . Acute non Q wave MI (myocardial infarction), initial episode of care ([place]) [I21.4] 03/06/2013  . Alcohol use disorder, mild, abuse [F10.10] 10/01/2011  . NSTEMI (non-ST elevated myocardial infarction) ([place]) [I21.4] 10/01/2011  . Erectile dysfunction [N52.9] 08/01/2010  . Urge incontinence of urine [N39.41] 08/01/2010  . Chronic alcoholic pancreatitis ([place]) [K86.0] 07/24/2010  . Tobacco use disorder [F17.200] 07/24/2010  . Hypertension [I10] 07/24/2010   . Hyperlipidemia [E78.5] 07/24/2010  . CVA (cerebral vascular accident) ([place]) [I63.9] 07/24/2010  . Chronic back pain [M54.9, G89.29] 07/24/2010   Total Time spent with patient: 20 minutes  Past Psychiatric History:  Depression.  Past Medical History:  Past Medical History  Diagnosis Date  . Hypertension   . Stroke ([place])   . Hyperlipidemia   . Depression   . Chronic pancreatitis ([place])   . Substance abuse   . Seizures ([place])   . Myocardial infarction ([place])   . Coronary artery disease     Past Surgical History  Procedure Laterality Date  . Back surgery    . Coronary stent placement    . Left heart catheterization with coronary angiogram N/A 10/01/2011    Procedure: LEFT HEART CATHETERIZATION WITH CORONARY ANGIOGRAM;  Surgeon: Clent Demark, MD;  Location: [facility] CATH LAB;  Service: Cardiovascular;  Laterality: N/A;  . Percutaneous coronary stent intervention (pci-s) N/A 10/02/2011    Procedure: PERCUTANEOUS CORONARY STENT INTERVENTION (PCI-S);  Surgeon: Clent Demark, MD;  Location: [facility] CATH LAB;  Service: Cardiovascular;  Laterality: N/A;  . Left heart catheterization with coronary angiogram N/A 03/06/2013    Procedure: LEFT HEART CATHETERIZATION WITH CORONARY ANGIOGRAM;  Surgeon: Clent Demark, MD;  Location: [facility] CATH LAB;  Service: Cardiovascular;  Laterality: N/A;   Family History: History reviewed. No pertinent family history. Family Psychiatric  HistSee H&P.l History:  History  Alcohol Use  . Yes    Comment: beer every other day     History  Drug Use No    Social History   Social History  . Marital Status: Married    Spouse Name: N/A  . Number of Children: N/A  . Years of  Education: N/A   Social History Main Topics  . Smoking status: Current Some Day Smoker -- 0.33 packs/day for 35 years    Types: Cigarettes  . Smokeless tobacco: Never Used     Comment: working on quitting  . Alcohol Use: Yes     Comment: beer every other day  . Drug Use: No  . Sexual  Activity: Not Asked   Other Topics Concern  . None   Social History Narrative   The patient lives alone with his significant other, Jonnie Finner.  The patient has a 54 year old daughter.  He is currently unemployed, and  has a high school degree.        He has a history of 1- 1.5 pack per day smoking of many years.  He states he has recently cut down to half pack per day (06/2010).      He has a history of heavy alcohol use, states typically drinking a fifth to a pint of gin daily, after stroke in 06/2010, decreased to one pint of gin per week, and after hospitalization for acute on chronic pancreatitis, was abstinent for at least 1-2 weeks (from 5/7 to 5/17)        He denies any illicit drug use.    Additional Social History:                         Sleep: Fair  Appetite:  Fair  Current Medications: Current Facility-Administered Medications  Medication Dose Route Frequency Provider Last Rate Last Dose  . acetaminophen (TYLENOL) tablet 650 mg  650 mg Oral Q4H PRN Patrecia Pour, NP      . alum & mag hydroxide-simeth (MAALOX/MYLANTA) 200-200-20 MG/5ML suspension 30 mL  30 mL Oral PRN Patrecia Pour, NP      . aspirin EC tablet 81 mg  81 mg Oral Daily Patrecia Pour, NP   81 mg at 09/24/15 0923  . atorvastatin (LIPITOR) tablet 80 mg  80 mg Oral q1800 Patrecia Pour, NP   80 mg at 09/23/15 1808  . clopidogrel (PLAVIX) tablet 75 mg  75 mg Oral Daily Patrecia Pour, NP   75 mg at 09/24/15 [id number]  . diclofenac (VOLTAREN) EC tablet 75 mg  75 mg Oral Daily Patrecia Pour, NP   75 mg at 09/24/15 [id number]  . gabapentin (NEURONTIN) capsule 300 mg  300 mg Oral Daily Patrecia Pour, NP   300 mg at 09/24/15 [id number]  . ibuprofen (ADVIL,MOTRIN) tablet 600 mg  600 mg Oral Q8H PRN Patrecia Pour, NP      . magnesium hydroxide (MILK OF MAGNESIA) suspension 30 mL  30 mL Oral Daily PRN Patrecia Pour, NP      . mirtazapine (REMERON) tablet 15 mg  15 mg Oral QHS Clovis Fredrickson, MD   15 mg at 09/23/15  2218  . nicotine (NICODERM CQ - dosed in mg/24 hours) patch 21 mg  21 mg Transdermal Daily Starling Jessie B Ezri Landers, MD   21 mg at 09/24/15 1126  . OXcarbazepine (TRILEPTAL) tablet 300 mg  300 mg Oral BID Clovis Fredrickson, MD   300 mg at 09/24/15 0923  . risperiDONE (RISPERDAL) tablet 3 mg  3 mg Oral BID Clovis Fredrickson, MD   3 mg at 09/24/15 0926  . zolpidem (AMBIEN) tablet 5 mg  5 mg Oral QHS PRN Patrecia Pour, NP   5 mg at 09/20/15 2229    Lab  Results: No results found for this or any previous visit (from the past 48 hour(s)).  Blood Alcohol level:  Lab Results  Component Value Date   [facility] 163* 09/19/2015   ETH <11 [id number]    Metabolic Disorder Labs: Lab Results  Component Value Date   HGBA1C 6.4* 03/19/2013   MPG 137* 03/19/2013   MPG 131* 03/06/2013   No results found for: PROLACTIN Lab Results  Component Value Date   CHOL 134 03/20/2013   TRIG 51 03/20/2013   HDL 49 03/20/2013   CHOLHDL 2.7 03/20/2013   VLDL 10 03/20/2013   LDLCALC 75 03/20/2013   LDLCALC 127* 07/29/2012    Physical Findings: AIMS:  , ,  ,  ,    CIWA:    COWS:     Musculoskeletal: Strength & Muscle Tone: within normal limits Gait & Station: normal Patient leans: N/A  Psychiatric Specialty Exam: Physical Exam  Nursing note and vitals reviewed.   Review of Systems  Psychiatric/Behavioral: Positive for depression and suicidal ideas. The patient is nervous/anxious.   All other systems reviewed and are negative.   Blood pressure 129/73, pulse 103, temperature 98.2 F (36.8 C), temperature source Oral, resp. rate 18, height 5\' 11"  (1.803 m), weight 71.215 kg (157 lb), SpO2 100 %.Body mass index is 21.91 kg/(m^2).  General Appearance: Casual  Eye Contact:  Good  Speech:  Pressured  Volume:  Normal  Mood:  Dysphoric and Irritable  Affect:  Congruent  Thought Process:  Goal Directed  Orientation:  Full (Time, Place, and Person)  Thought Content:  Illogical, Delusions and Paranoid  Ideation  Suicidal Thoughts:  Yes.  with intent/plan  Homicidal Thoughts:  Yes.  with intent/plan  Memory:  Immediate;   Fair Recent;   Fair Remote;   Fair  Judgement:  Poor  Insight:  Lacking  Psychomotor Activity:  Decreased  Concentration:  Concentration: Fair and Attention Span: Fair  Recall:  [employer] of Knowledge:  Fair  Language:  Fair  Akathisia:  No  Handed:  Right  AIMS (if indicated):     Assets:  Communication Skills Desire for Improvement Financial Resources/Insurance Housing Physical Health Resilience Social Support  ADL's:  Intact  Cognition:  WNL  Sleep:  Number of Hours: 5     Treatment Plan Summary: Daily contact with patient to assess and evaluate symptoms and progress in treatment and Medication management   Mr. Colello is a 55 year old male with a history of depression admitted for making suicidal and homicidal threats while drunk in the context of relationship problems.  1. Suicidal and homicidal ideation. The patient still voices homicidal threats but is able to contract for safety in the hospital.  2. Mood. We started Remeron for depression, Risperdal for psychosis and Trileptal for mood stabilization. We increased Risperdal to 3 mg bid.  3. Insomnia. He takes Ambien for sleep.  4. Alcohol abuse. The patient denies drinking on a regular basis. There are no symptoms of withdrawal. Vital signs are stable.  5. History of CVA. He is on aspirin and Plavix.  6. Chronic back pain. He is on call when necessary and Neurontin.  7. Dyslipidemia. He is on Lipitor.  8. Smoking. Nicotine patch is available.  9. Disposition. He will be discharged to home. He will follow up with a psychiatrist in [place]. The patient refuses any follow-up.  Orson Slick, MD 09/24/2015, 1:45 PM

## 2015-09-24 NOTE — Plan of Care (Signed)
Problem: Coping: Goal: Ability to cope will improve Outcome: Progressing Patient attending  Unit programing  Working on coping skills.

## 2015-09-24 NOTE — BHH Group Notes (Signed)
Goals Group Date/Time: 09/24/15 9am Type of Therapy and Topic: Group Therapy: Goals Group: SMART Goals   Participation Level: Moderate  Description of Group:    The purpose of a daily goals group is to assist and guide patients in setting recovery/wellness-related goals. The objective is to set goals as they relate to the crisis in which they were admitted. Patients will be using SMART goal modalities to set measurable goals. Characteristics of realistic goals will be discussed and patients will be assisted in setting and processing how one will reach their goal. Facilitator will also assist patients in applying interventions and coping skills learned in psycho-education groups to the SMART goal and process how one will achieve defined goal.   Therapeutic Goals:   -Patients will develop and document one goal related to or their crisis in which brought them into treatment.  -Patients will be guided by LCSW using SMART goal setting modality in how to set a measurable, attainable, realistic and time sensitive goal.  -Patients will process barriers in reaching goal.  -Patients will process interventions in how to overcome and successful in reaching goal.   Patient's Goal: Pt did not share and did not respond to prompts from the CSW, but did present as having improved AEB the pt not presenting as hostile towards the CSW.     Therapeutic Modalities:  Motivational Interviewing  Art gallery manager  SMART goals setting   Alphonse Guild. Rheya Minogue, LCSWA, LCAS

## 2015-09-24 NOTE — Progress Notes (Signed)
D: Pt denies SI/HI/AVH. Pt is angry and irritable towards nursing staff, very guarded with information Pt  appears less anxious thoughts are organized interacting with peers appropriately.  A: Pt was offered support and encouragement. Pt was given scheduled medications. Pt was encouraged to attend groups. Q 15 minute checks were done for safety.  R:Pt attends groups and interacts well with peers. Pt is taking medication. Pt is not receptive to treatment. Safety maintained on unit.

## 2015-09-24 NOTE — Tx Team (Signed)
Interdisciplinary Treatment Plan Update (Adult)  Date:  09/24/2015 Time Reviewed:  3:16 PM  Progress in Treatment: Attending groups: Yes. Participating in groups:  Yes. Taking medication as prescribed:  Yes. Tolerating medication:  Yes. Family/Significant othe contact made:  Yes, wife contacted Patient understands diagnosis:  No. Discussing patient identified problems/goals with staff:  Yes. Medical problems stabilized or resolved:  Yes. Denies suicidal/homicidal ideation: Yes. Issues/concerns per patient self-inventory:  No. Other:  New problem(s) identified: No, Describe:     Discharge Plan or Barriers: Home with wife, Follow up TBD  Reason for Continuation of Hospitalization: Aggression Depression Medication stabilization  Comments:[name] is not as irritable and contrary as yesterday. He was able to meet with his treatment team without cursing or being upset. He takes medications as prescribed. He told that he spoke with his daughter who encouraged him to cooperate with treatment program so he can be discharged. He apparently spoke with his wife on the phone and she intends to visit him tonight. We learned from the wife that the patient had been paranoid and suspicious with the past several years pointing to psychotic disorder rather than depression with psychotic features. This led to a restraining order that her employer took out to prevent spousal harassment at the workplace. There are no somatic complaints. Sleep and appetite are fair. He started attending groups and requested information on medication currently given  Estimated length of stay:2-3 days  New goal(s):  Review of initial/current patient goals per problem list:   1.  Goal(s): Patient will participate in aftercare plan * Met: NO * Target date: at discharge * As evidenced by: Patient will participate within aftercare plan AEB aftercare provider and housing plan at discharge being identified.   2.  Goal  (s): Patient will exhibit decreased depressive symptoms and suicidal ideations. * Met: No *  Target date: at discharge * As evidenced by: Patient will utilize self-rating of depression at 3 or below and demonstrate decreased signs of depression or be deemed stable for discharge by MD. 3.  Goal (s): Patient will demonstrate decreased symptoms of psychosis. * Met: No  *  Target date: at discharge As evidenced by: Patient will not endorse signs of psychosis or be deemed stable for discharge by MD.  Attendees: Patient:  [name] 7/11/20173:16 PM  Family:   7/11/20173:16 PM  Physician:  Orson Slick 7/11/20173:16 PM  Nursing:   Carolynn Sayers, RN 7/11/20173:16 PM  Case Manager:   7/11/20173:16 PM  Counselor:  Dossie Arbour, LCSW 7/11/20173:16 PM  Other:  Everitt Amber, [place] 7/11/20173:16 PM  Other:   7/11/20173:16 PM  Other:   7/11/20173:16 PM  Other:  7/11/20173:16 PM  Other:  7/11/20173:16 PM  Other:  7/11/20173:16 PM  Other:  7/11/20173:16 PM  Other:  7/11/20173:16 PM  Other:  7/11/20173:16 PM  Other:   7/11/20173:16 PM   Scribe for Treatment Team:   August Saucer, 09/24/2015, 3:16 PM, MSW, LCSW

## 2015-09-24 NOTE — BHH Group Notes (Signed)
[place] Group Notes:  (Nursing/MHT/Case Management/Adjunct)  Date:  09/24/2015  Time:  2:12 PM  Type of Therapy:  Psychoeducational Skills  Participation Level:  Minimal  Participation Quality:  Appropriate and Attentive  Affect:  Flat  Cognitive:  Appropriate  Insight:  Appropriate  Engagement in Group:  Engaged  Modes of Intervention:  Discussion and Education  Summary of Progress/Problems:  [name] 09/24/2015, 2:12 PM

## 2015-09-24 NOTE — BHH Group Notes (Signed)
[place] LCSW Group Therapy   09/24/2015 3pm  Type of Therapy: Group Therapy   Participation Level: Active   Participation Quality: Attentive, Sharing and Supportive   Affect: Appropriate  Cognitive: Alert and Oriented   Insight: Developing/Improving and Engaged   Engagement in Therapy: Developing/Improving and Engaged   Modes of Intervention: Clarification, Confrontation, Discussion, Education, Exploration,  Limit-setting, Orientation, Problem-solving, Rapport Building, Art therapist, Socialization and Support  Summary of Progress/Problems: The topic for group therapy was feelings about diagnosis. Pt actively participated in group discussion on their past and current diagnosis and how they feel towards this. Pt also identified how society and family members judge them, based on their diagnosis as well as stereotypes and stigmas. Pt shared many of his relatives were diagnosed similarly to the pt and indentified that the pt was mystified that he was diagnosed so late in life.  CSW educated the pt about onset of mental health diagnoses at later stages in life and pt identified that this might" be possible with the pt.  Pt was polite and cooperative with the CSW and other group members and focused and attentive to the topics discussed and the sharing of others.  Pt seems to have made great improvement during group, as evidenced by increased sharing, sharing at length when prompted and pt presents a happy and increasingly energetic, as compared to previous sessions.       Alphonse Guild. Esly Selvage, LCSWA, LCAS

## 2015-09-24 NOTE — Progress Notes (Signed)
Recreation Therapy Notes  Date: 07.11.17 Time: 9:30 am Location: Craft Room  Group Topic: Self-expression  Goal Area(s) Addresses:  Patient will identify one color per emotion listed on wheel. Patient will verbalize one emotion experienced during session. Patient will be educated on other forms of self-expression.  Behavioral Response: Attentive, Off Topic  Intervention: Emotion Wheel  Activity: Patients were given an Emotion Wheel worksheet and instructed to pick a color for each emotion listed on the wheel.  Education: LRT educated patients on different forms of self-expression.  Education Outcome: In group clarification offered  Clinical Observations/Feedback: Patient completed activity by picking a color for each emotion. Patient did not contribute to group discussion. Patient talked about cursing and asked who God is.  Leonette Monarch, LRT/CTRS 09/24/2015 10:22 AM

## 2015-09-25 NOTE — BHH Group Notes (Signed)
[place] Group Notes:  (Nursing/MHT/Case Management/Adjunct)  Date:  09/25/2015  Time:  5:52 PM  Type of Therapy:  Psychoeducational Skills  Participation Level:  Active  Participation Quality:  Appropriate, Attentive and Sharing  Affect:  Appropriate  Cognitive:  Alert and Appropriate  Insight:  Appropriate  Engagement in Group:  Engaged  Modes of Intervention:  Discussion, Education and Support  Summary of Progress/Problems:  Adela Lank [facility] 09/25/2015, 5:52 PM

## 2015-09-25 NOTE — BHH Group Notes (Signed)
[place] LCSW Group Therapy   09/25/2015  1pm   Type of Therapy: Group Therapy   Participation Level: Active   Participation Quality: Attentive, Sharing and Supportive   Affect: Depressed and Flat   Cognitive: Alert and Oriented   Insight: Developing/Improving and Engaged   Engagement in Therapy: Developing/Improving and Engaged   Modes of Intervention: Clarification, Confrontation, Discussion, Education, Exploration, Limit-setting, Orientation, Problem-solving, Rapport Building, Art therapist, Socialization and Support   Summary of Progress/Problems: The topic for group today was emotional regulation. This group focused on both positive and negative emotion identification and allowed  group members to process ways to identify feelings, regulate negative emotions, and find healthy ways to manage internal/external emotions. Group members were asked to reflect on a time when their reaction to an emotion led to a negative outcome and explored how alternative responses using emotion regulation would have benefited them. Group members were also asked to discuss a time when emotion regulation was utilized when a negative emotion was experienced. Pt shared at length about how the pt had difficulty regulating the pt's emotions due to the pt being held in the hospital against his will.  Pt used profanity and cursed initially when sharing.  Pt responded to prompts from the CSw to stop using profanity but no longer shared.  Pt presented as hostile.        Willie Guild. Galina Haddox, MSW, LCSWA, LCAS

## 2015-09-25 NOTE — Progress Notes (Signed)
D: Patient stated slept good last night .Stated appetite is good and energy level  Is normal. Stated concentration is good . Stated on Depression scale 0, hopeless 0 and anxiety 1 .( low 0-10 high) Denies suicidal  homicidal ideations  .  No auditory hallucinations  No pain concerns . Appropriate ADL'S. Interacting with peers and staff. Continue to voice of wanting to go home  A: Encourage patient participation with unit programming . Instruction  Given on  Medication , verbalize understanding. R: Voice no other concerns. Staff continue to monitor

## 2015-09-25 NOTE — Progress Notes (Signed)
Calm and cooperative. States he's ready to go home. Shaved. Med compliant. Attends group. Denies SI/HI. No c/o pain/discomfort noted. Slept 6 hours

## 2015-09-25 NOTE — Progress Notes (Signed)
Recreation Therapy Notes  Date: 07.12.17 Time: 9:30 am Location: Craft Room  Group Topic: Self-esteem  Goal Area(s) Addresses:  Patient will write at least one positive trait about self. Patient will verbalize benefit of having healthy self-esteem.  Behavioral Response: Attentive  Intervention: I Am  Activity: Patients were given a worksheet with the letter I on it and instructed to write as many positive traits about themselves inside the letter.  Education: LRT educated patients on ways they can increase their self-esteem.  Education Outcome: Acknowledges education/In group clarification offered  Clinical Observations/Feedback: Patient wrote positive traits. Patient did not contribute to group discussion.  Leonette Monarch, LRT/CTRS 09/25/2015 10:12 AM

## 2015-09-25 NOTE — Plan of Care (Signed)
Problem: Safety: Goal: Periods of time without injury will increase Outcome: Not Met (add Reason) Calm and cooperative. No injuries noted. Falls bundle in place. No c/o pain/discomfort noted. No c/o pain/discomfort noted.

## 2015-09-25 NOTE — Plan of Care (Signed)
Problem: Coping: Goal: Ability to verbalize frustrations and anger appropriately will improve Outcome: Progressing Receiving listing of coping  Skills , encourage to work on them .      

## 2015-09-25 NOTE — Progress Notes (Signed)
[facility] MD Progress Note  09/25/2015 12:57 PM Antoinne Samarripa  MRN:  [id number]  Subjective:  Mr. Evins denies any symptoms of depression, anxiety, or psychosis. He denies suicidal or homicidal thoughts. He accepts medications and tolerates them well. He participates in programming. Yesterday in treatment team he told us that his wife will be visiting. We were hoping to test their interaction that the patient had been homicidal towards his wife. She could not come. According to the patient he is ready to pick him up.   Principal Problem: Schizophrenia spectrum disorder with psychotic disorder type not yet determined [facility]) Diagnosis:   Patient Active Problem List   Diagnosis Date Noted  . Schizophrenia spectrum disorder with psychotic disorder type not yet determined ([place]) [F20.89, F29] 09/24/2015  . Major depressive disorder, recurrent severe without psychotic features ([place]) [F33.2]   . Intermittent explosive disorder [F63.81] 09/20/2015  . Severe recurrent major depressive disorder with psychotic features ([place]) [F33.3] 09/20/2015  . Suicidal ideation [R45.851] 09/20/2015  . Homicidal ideation [R45.850] 09/20/2015  . Chest pain at rest [R07.9] 03/19/2013  . Acute non Q wave MI (myocardial infarction), initial episode of care ([place]) [I21.4] 03/06/2013  . Alcohol use disorder, mild, abuse [F10.10] 10/01/2011  . NSTEMI (non-ST elevated myocardial infarction) ([place]) [I21.4] 10/01/2011  . Erectile dysfunction [N52.9] 08/01/2010  . Urge incontinence of urine [N39.41] 08/01/2010  . Chronic alcoholic pancreatitis ([place]) [K86.0] 07/24/2010  . Tobacco use disorder [F17.200] 07/24/2010  . Hypertension [I10] 07/24/2010  . Hyperlipidemia [E78.5] 07/24/2010  . CVA (cerebral vascular accident) ([place]) [I63.9] 07/24/2010  . Chronic back pain [M54.9, G89.29] 07/24/2010   Total Time spent with patient: 20 minutes  Past Psychiatric History: Depression.ast Medical History:  Past Medical History  Diagnosis  Date  . Hypertension   . Stroke ([place])   . Hyperlipidemia   . Depression   . Chronic pancreatitis ([place])   . Substance abuse   . Seizures ([place])   . Myocardial infarction ([place])   . Coronary artery disease     Past Surgical History  Procedure Laterality Date  . Back surgery    . Coronary stent placement    . Left heart catheterization with coronary angiogram N/A 10/01/2011    Procedure: LEFT HEART CATHETERIZATION WITH CORONARY ANGIOGRAM;  Surgeon: Clent Demark, MD;  Location: [facility] CATH LAB;  Service: Cardiovascular;  Laterality: N/A;  . Percutaneous coronary stent intervention (pci-s) N/A 10/02/2011    Procedure: PERCUTANEOUS CORONARY STENT INTERVENTION (PCI-S);  Surgeon: Clent Demark, MD;  Location: [facility] CATH LAB;  Service: Cardiovascular;  Laterality: N/A;  . Left heart catheterization with coronary angiogram N/A 03/06/2013    Procedure: LEFT HEART CATHETERIZATION WITH CORONARY ANGIOGRAM;  Surgeon: Clent Demark, MD;  Location: [facility] CATH LAB;  Service: Cardiovascular;  Laterality: N/A;   Family History: History reviewed. No pertinent family history. Family Psychiatric  History: See H&P.ial History:  History  Alcohol Use  . Yes    Comment: beer every other day     History  Drug Use No    Social History   Social History  . Marital Status: Married    Spouse Name: N/A  . Number of Children: N/A  . Years of Education: N/A   Social History Main Topics  . Smoking status: Current Some Day Smoker -- 0.33 packs/day for 35 years    Types: Cigarettes  . Smokeless tobacco: Never Used     Comment: working on quitting  . Alcohol Use: Yes     Comment:  beer every other day  . Drug Use: No  . Sexual Activity: Not Asked   Other Topics Concern  . None   Social History Narrative   The patient lives alone with his significant other, Jonnie Finner.  The patient has a 29 year old daughter.  He is currently unemployed, and  has a high school degree.        He has a history of 1- 1.5 pack  per day smoking of many years.  He states he has recently cut down to half pack per day (06/2010).      He has a history of heavy alcohol use, states typically drinking a fifth to a pint of gin daily, after stroke in 06/2010, decreased to one pint of gin per week, and after hospitalization for acute on chronic pancreatitis, was abstinent for at least 1-2 weeks (from 5/7 to 5/17)        He denies any illicit drug use.    Additional Social History:                         Sleep: Fair  Appetite:  Fair  Current Medications: Current Facility-Administered Medications  Medication Dose Route Frequency Provider Last Rate Last Dose  . acetaminophen (TYLENOL) tablet 650 mg  650 mg Oral Q4H PRN Patrecia Pour, NP      . alum & mag hydroxide-simeth (MAALOX/MYLANTA) 200-200-20 MG/5ML suspension 30 mL  30 mL Oral PRN Patrecia Pour, NP      . aspirin EC tablet 81 mg  81 mg Oral Daily Patrecia Pour, NP   81 mg at 09/25/15 0825  . atorvastatin (LIPITOR) tablet 80 mg  80 mg Oral q1800 Patrecia Pour, NP   80 mg at 09/24/15 1719  . clopidogrel (PLAVIX) tablet 75 mg  75 mg Oral Daily Patrecia Pour, NP   75 mg at 09/25/15 0825  . diclofenac (VOLTAREN) EC tablet 75 mg  75 mg Oral Daily Patrecia Pour, NP   75 mg at 09/25/15 0824  . gabapentin (NEURONTIN) capsule 300 mg  300 mg Oral Daily Patrecia Pour, NP   300 mg at 09/25/15 0824  . ibuprofen (ADVIL,MOTRIN) tablet 600 mg  600 mg Oral Q8H PRN Patrecia Pour, NP      . magnesium hydroxide (MILK OF MAGNESIA) suspension 30 mL  30 mL Oral Daily PRN Patrecia Pour, NP      . mirtazapine (REMERON) tablet 15 mg  15 mg Oral QHS Rubert Frediani B Mat Stuard, MD   15 mg at 09/24/15 2157  . nicotine (NICODERM CQ - dosed in mg/24 hours) patch 21 mg  21 mg Transdermal Daily Kumiko Fishman B Korra Christine, MD   21 mg at 09/25/15 1009  . OXcarbazepine (TRILEPTAL) tablet 300 mg  300 mg Oral BID Clovis Fredrickson, MD   300 mg at 09/25/15 0825  . risperiDONE (RISPERDAL) tablet 3 mg   3 mg Oral BID Debar Plate B Adisson Deak, MD   3 mg at 09/25/15 0825  . zolpidem (AMBIEN) tablet 5 mg  5 mg Oral QHS PRN Patrecia Pour, NP   5 mg at 09/20/15 2229    Lab Results: No results found for this or any previous visit (from the past 53 hour(s)).  Blood Alcohol level:  Lab Results  Component Value Date   [facility] 163* 09/19/2015   ETH <11 [id number]    Metabolic Disorder Labs: Lab Results  Component Value Date   HGBA1C  6.4* 03/19/2013   MPG 137* 03/19/2013   MPG 131* 03/06/2013   No results found for: PROLACTIN Lab Results  Component Value Date   CHOL 134 03/20/2013   TRIG 51 03/20/2013   HDL 49 03/20/2013   CHOLHDL 2.7 03/20/2013   VLDL 10 03/20/2013   LDLCALC 75 03/20/2013   LDLCALC 127* 07/29/2012    Physical Findings: AIMS:  , ,  ,  ,    CIWA:    COWS:     Musculoskeletal: Strength & Muscle Tone: within normal limits Gait & Station: normal Patient leans: N/A  Psychiatric Specialty Exam: Physical Exam  Nursing note and vitals reviewed.   Review of Systems  Psychiatric/Behavioral: Positive for depression.  All other systems reviewed and are negative.   Blood pressure 139/72, pulse 106, temperature 97.7 F (36.5 C), temperature source Oral, resp. rate 18, height 5\' 11"  (1.803 m), weight 71.215 kg (157 lb), SpO2 100 %.Body mass index is 21.91 kg/(m^2).  General Appearance: Casual  Eye Contact:  Good  Speech:  Clear and Coherent  Volume:  Normal  Mood:  Anxious  Affect:  Appropriate  Thought Process:  Goal Directed  Orientation:  Full (Time, Place, and Person)  Thought Content:  WDL  Suicidal Thoughts:  No  Homicidal Thoughts:  No  Memory:  Immediate;   Fair Recent;   Fair Remote;   Fair  Judgement:  Impaired  Insight:  Shallow  Psychomotor Activity:  Normal  Concentration:  Concentration: Fair and Attention Span: Fair  Recall:  [employer] of Knowledge:  Fair  Language:  Fair  Akathisia:  No  Handed:  Right  AIMS (if indicated):     Assets:   Communication Skills Desire for Improvement Financial Resources/Insurance Housing Physical Health Resilience Social Support  ADL's:  Intact  Cognition:  WNL  Sleep:  Number of Hours: 6     Treatment Plan Summary: Daily contact with patient to assess and evaluate symptoms and progress in treatment and Medication management   Mr. Lonardo is a 55 year old male with a history of depression admitted for making suicidal and homicidal threats while drunk in the context of relationship problems.  1. Suicidal and homicidal ideation. The patient still voices homicidal threats but is able to contract for safety in the hospital.  2. Mood. We started Remeron for depression, Risperdal for psychosis and Trileptal for mood stabilization. We increased Risperdal to 3 mg bid.  3. Insomnia. He takes Ambien for sleep.  4. Alcohol abuse. The patient denies drinking on a regular basis. There are no symptoms of withdrawal. Vital signs are stable.  5. History of CVA. He is on aspirin and Plavix.  6. Chronic back pain. He is on call when necessary and Neurontin.  7. Dyslipidemia. He is on Lipitor.  8. Smoking. Nicotine patch is available.  9. Disposition. He will be discharged to home. He will follow up with a psychiatrist in [place]. The patient refuses any follow-up.  Orson Slick, MD 09/25/2015, 12:57 PM

## 2015-09-26 MED ORDER — OXCARBAZEPINE 300 MG PO TABS: 300.0000 mg | ORAL_TABLET | Freq: Two times a day (BID) | ORAL | Status: DC

## 2015-09-26 MED ORDER — RISPERIDONE 3 MG PO TABS: 3.0000 mg | ORAL_TABLET | Freq: Two times a day (BID) | ORAL | Status: DC

## 2015-09-26 MED ORDER — MIRTAZAPINE 15 MG PO TABS: 15.0000 mg | ORAL_TABLET | Freq: Every day | ORAL | Status: DC

## 2015-09-26 MED ORDER — ZOLPIDEM TARTRATE 5 MG PO TABS: 5.0000 mg | ORAL_TABLET | Freq: Every evening | ORAL | Status: DC | PRN

## 2015-09-26 NOTE — Progress Notes (Signed)
  [facility] Adult Case Management Discharge Plan :  Will you be returning to the same living situation after discharge:  Yes,  home At discharge, do you have transportation home?: Yes,  daughter  Do you have the ability to pay for your medications: Yes,  insurance, income   Release of information consent forms completed and in the chart;  Patient's signature needed at discharge.  Patient to Follow up at: Follow-up Information    Follow up with [facility] On 09/26/2015.   Why:  A nurse will call you today to scehdule a hospital follow up appointment.    Contact information:   [address] [place], [place] [address] Phone: [phone]       Next level of care provider has access to [place] and Suicide Prevention discussed: Yes,  with patient and wife  Have you used any form of tobacco in the last 30 days? (Cigarettes, Smokeless Tobacco, Cigars, and/or Pipes): Yes  Has patient been referred to the Quitline?: Patient refused referral  Patient has been referred for addiction treatment: Pt. refused referral  Wray Kearns MSW, [place]  09/26/2015, 1:34 PM

## 2015-09-26 NOTE — Progress Notes (Signed)
Recreation Therapy Notes  Date: 07.13.17 Time: 9:30 am Location: Craft Room  Group Topic: Leisure Education  Goal Area(s) Addresses:  Patient will identify activities for each letter of the alphabet. Patient will verbalize ability to use leisure as a Technical sales engineer.  Behavioral Response: Attentive, Interactive  Intervention: Leisure Alphabet  Activity: Patients were given a Leisure Air traffic controller and instructed to identify a leisure activity for each letter of the alphabet.   Education: LRT educated patients on what they need to participate in leisure.  Education Outcome: Acknowledges education/In group clarification offered  Clinical Observations/Feedback: Patient completed activity by writing healthy leisure activities. Patient contributed to group discussion by stating healthy leisure activities, why leisure is a good coping skill, and what he needs to participate in leisure.  Leonette Monarch, LRT/CTRS 09/26/2015 10:21 AM

## 2015-09-26 NOTE — Plan of Care (Signed)
Problem: Coping: Goal: Ability to verbalize frustrations and anger appropriately will improve Outcome: Not Met (add Reason) Calm and cooperative. Flat affect. States he was angry earlier with his wife but feeling much better now. Med compliant. No PRNs given. S/e and adverse reactions discussed. Questions encouraged. Attends group. Limited interaction with peers. No behavior issues noted. Denies SI/HI. q 15 min checks maintained for safety.

## 2015-09-26 NOTE — Discharge Summary (Signed)
Physician Discharge Summary Note  Patient:  [name] is an 55 y.o., male MRN:  [id number] DOB:  1960/07/07 Patient phone:  [phone] (home)  Patient address:   [place] [address],  Total Time spent with patient: 30 minutes  Date of Admission:  09/20/2015 Date of Discharge: 09/26/2015  Reason for Admission:  Suicidal and homicidal ideation.  Identifying data. [name] is a 55 year old male with a history of depression.  Chief complaint. "I want a divorce."  History of present illness. Information was obtained from the patient and the chart. The patient reports mild symptoms of depression that are mostly related to his deteriorating relationship with his wife. She has been unfaithful to him for years and they do not have a relationship for the past year and a half. He decided not to move out of the house as it would constitute an apartment. They did not sign separation papers. On the night of admission to [facility] emergency room the patient was drinking all night long. He believes that he made some homicidal threats towards his wife but claims that his wife was at work. He does not know how he ended up in the emergency room. According to his records the patient has been threatening his wife with a gun and wanted to be killed by police. He denies having a gun. He reports mild symptoms of depression with poor sleep, decreased appetite and anhedonia, feeling of guilt and hopelessness worthlessness, low energy and concentration, some irritability. He denies feeling suicidal or homicidal and sober. He denies any psychotic symptoms or signs suggestive of bipolar mania. He denies heightened anxiety. He adamantly denies that he drinks excessively. He does not use illicit substances. There is no history of prescription pill abuse.  His wife reported that for several years the atient has been jealous and suspicious. He has been staling her at work and there is a  restraining order at her workplace. There was a stand off with the police before admission. The gun tht he was reportedly threatening his wife with has never been found. The wife is supportive and not at all afraid of the patient. She will welcome him back.   Past psychiatric history. The patient was hospitalized once many years ago after suicide attempt by overdose. She reports that it was in a similar scenario when his wife was unfaithful. He has not been taking any medications. He does not see a psychiatrist.  Family psychiatric history. Nonreported.  Social history. He is disabled for multiple medical problems. He lives with his wife and his daughter. He has every intention to see a lawyer as soon as he gets out of the hospital. He does not want to move out of the house but has a friend whom he could stay for a few days following discharge.  Principal Problem: Schizophrenia spectrum disorder with psychotic disorder type not yet determined [facility]) Discharge Diagnoses: Patient Active Problem List   Diagnosis Date Noted  . Schizophrenia spectrum disorder with psychotic disorder type not yet determined ([place]) [F20.89, F29] 09/24/2015  . Intermittent explosive disorder [F63.81] 09/20/2015  . Severe recurrent major depressive disorder with psychotic features ([place]) [F33.3] 09/20/2015  . Suicidal ideation [R45.851] 09/20/2015  . Homicidal ideation [R45.850] 09/20/2015  . Chest pain at rest [R07.9] 03/19/2013  . Acute non Q wave MI (myocardial infarction), initial episode of care ([place]) [I21.4] 03/06/2013  . Alcohol use disorder, mild, abuse [F10.10] 10/01/2011  . NSTEMI (non-ST elevated myocardial infarction) ([place]) [  I21.4] 10/01/2011  . Erectile dysfunction [N52.9] 08/01/2010  . Urge incontinence of urine [N39.41] 08/01/2010  . Chronic alcoholic pancreatitis ([place]) [K86.0] 07/24/2010  . Tobacco use disorder [F17.200] 07/24/2010  . Hypertension [I10] 07/24/2010  . Hyperlipidemia [E78.5] 07/24/2010   . CVA (cerebral vascular accident) ([place]) [I63.9] 07/24/2010  . Chronic back pain [M54.9, G89.29] 07/24/2010    Past Medical History:  Past Medical History  Diagnosis Date  . Hypertension   . Stroke ([place])   . Hyperlipidemia   . Depression   . Chronic pancreatitis ([place])   . Substance abuse   . Seizures ([place])   . Myocardial infarction ([place])   . Coronary artery disease     Past Surgical History  Procedure Laterality Date  . Back surgery    . Coronary stent placement    . Left heart catheterization with coronary angiogram N/A 10/01/2011    Procedure: LEFT HEART CATHETERIZATION WITH CORONARY ANGIOGRAM;  Surgeon: Clent Demark, MD;  Location: [facility] CATH LAB;  Service: Cardiovascular;  Laterality: N/A;  . Percutaneous coronary stent intervention (pci-s) N/A 10/02/2011    Procedure: PERCUTANEOUS CORONARY STENT INTERVENTION (PCI-S);  Surgeon: Clent Demark, MD;  Location: [facility] CATH LAB;  Service: Cardiovascular;  Laterality: N/A;  . Left heart catheterization with coronary angiogram N/A 03/06/2013    Procedure: LEFT HEART CATHETERIZATION WITH CORONARY ANGIOGRAM;  Surgeon: Clent Demark, MD;  Location: [facility] CATH LAB;  Service: Cardiovascular;  Laterality: N/A;   Family History: History reviewed. No pertinent family history.  Social History:  History  Alcohol Use  . Yes    Comment: beer every other day     History  Drug Use No    Social History   Social History  . Marital Status: Married    Spouse Name: N/A  . Number of Children: N/A  . Years of Education: N/A   Social History Main Topics  . Smoking status: Current Some Day Smoker -- 0.33 packs/day for 35 years    Types: Cigarettes  . Smokeless tobacco: Never Used     Comment: working on quitting  . Alcohol Use: Yes     Comment: beer every other day  . Drug Use: No  . Sexual Activity: Not Asked   Other Topics Concern  . None   Social History Narrative   The patient lives alone with his significant other, [name].   The patient has a 67 year old daughter.  He is currently unemployed, and  has a high school degree.        He has a history of 1- 1.5 pack per day smoking of many years.  He states he has recently cut down to half pack per day (06/2010).      He has a history of heavy alcohol use, states typically drinking a fifth to a pint of gin daily, after stroke in 06/2010, decreased to one pint of gin per week, and after hospitalization for acute on chronic pancreatitis, was abstinent for at least 1-2 weeks (from 5/7 to 5/17)        He denies any illicit drug use.     Hospital Course:    Mr. Mcginley is a 55 year old male with a history of depression and psychosis admitted for making suicidal and homicidal threats while drunk in the context of relationship problems.  1. Suicidal and homicidal ideation. This has resolved. The patient denies any thoughts, intentions or plans to hurt himself or others. He is able to contract for sfety. His wife has  been aware of initial homicidal threats and wants her husband to return home. The patient is able to contract for safety. He is forward thinking and more optimistic about the future   2. Mood. We started Remeron for depression, Risperdal for psychosis and Trileptal for mood stabilization.   3. Insomnia. He takes Ambien for sleep.  4. Alcohol abuse. The patient denies drinking on a regular basis. There were no symptoms of withdrawal. Vital signs were stable.  5. History of CVA. He is on aspirin and Plavix.  6. Chronic back pain. He is on Voltaren and Neurontin.  7. Dyslipidemia. He is on Lipitor.  8. Smoking. Nicotine patch was available.  9. Disposition. He was discharged to home with his daughter. He will follow up with the [place] psychiatrist.   Physical Findings: AIMS:  , ,  ,  ,    CIWA:    COWS:     Musculoskeletal: Strength & Muscle Tone: within normal limits Gait & Station: normal Patient leans: N/A  Psychiatric Specialty Exam: Physical Exam   Nursing note and vitals reviewed.   Review of Systems  All other systems reviewed and are negative.   Blood pressure 130/101, pulse 117, temperature 98.2 F (36.8 C), temperature source Oral, resp. rate 18, height 5\' 11"  (1.803 m), weight 71.215 kg (157 lb), SpO2 100 %.Body mass index is 21.91 kg/(m^2).  See SRA.                                                  Sleep:  Number of Hours: 7     Have you used any form of tobacco in the last 30 days? (Cigarettes, Smokeless Tobacco, Cigars, and/or Pipes): Yes  Has this patient used any form of tobacco in the last 30 days? (Cigarettes, Smokeless Tobacco, Cigars, and/or Pipes) Yes, Yes, A prescription for an FDA-approved tobacco cessation medication was offered at discharge and the patient refused  Blood Alcohol level:  Lab Results  Component Value Date   [facility] 163* 09/19/2015   ETH <11 [id number]    Metabolic Disorder Labs:  Lab Results  Component Value Date   HGBA1C 6.4* 03/19/2013   MPG 137* 03/19/2013   MPG 131* 03/06/2013   No results found for: PROLACTIN Lab Results  Component Value Date   CHOL 134 03/20/2013   TRIG 51 03/20/2013   HDL 49 03/20/2013   CHOLHDL 2.7 03/20/2013   VLDL 10 03/20/2013   LDLCALC 75 03/20/2013   LDLCALC 127* 07/29/2012    See Psychiatric Specialty Exam and Suicide Risk Assessment completed by Attending Physician prior to discharge.  Discharge destination:  Home  Is patient on multiple antipsychotic therapies at discharge:  No   Has Patient had three or more failed trials of antipsychotic monotherapy by history:  No  Recommended Plan for Multiple Antipsychotic Therapies: NA  Discharge Instructions    Diet - low sodium heart healthy    Complete by:  As directed      Increase activity slowly    Complete by:  As directed             Medication List    TAKE these medications      Indication   aspirin 81 MG EC tablet  Take 1 tablet (81 mg total) by mouth daily.       atorvastatin 80 MG tablet  Commonly known as:  LIPITOR  Take 80 mg by mouth daily.      clopidogrel 75 MG tablet  Commonly known as:  PLAVIX  Take 75 mg by mouth daily.      diclofenac 75 MG EC tablet  Commonly known as:  VOLTAREN  Take 75 mg by mouth daily.      gabapentin 300 MG capsule  Commonly known as:  NEURONTIN  Take 300 mg by mouth daily.      mirtazapine 15 MG tablet  Commonly known as:  REMERON  Take 1 tablet (15 mg total) by mouth at bedtime.   Indication:  Major Depressive Disorder     Oxcarbazepine 300 MG tablet  Commonly known as:  TRILEPTAL  Take 1 tablet (300 mg total) by mouth 2 (two) times daily.   Indication:  Manic-Depression     risperiDONE 3 MG tablet  Commonly known as:  RISPERDAL  Take 1 tablet (3 mg total) by mouth 2 (two) times daily.   Indication:  Psychosis     zolpidem 5 MG tablet  Commonly known as:  AMBIEN  Take 1 tablet (5 mg total) by mouth at bedtime as needed for sleep.   Indication:  Trouble Sleeping           Follow-up Information    Follow up with [facility] On 09/26/2015.   Why:  A nurse will call you today to scehdule a hospital follow up appointment.    Contact information:   [address] [place], [place] [address] Phone: [phone]       Follow-up recommendations:  Activity:  as tolerated. Diet:  low sodium heart healthy. Other:  keep follow up appointment.  Comments:     Signed: Orson Slick, MD 09/26/2015, 3:10 PM

## 2015-09-26 NOTE — BHH Suicide Risk Assessment (Signed)
[facility] Discharge Suicide Risk Assessment   Principal Problem: Schizophrenia spectrum disorder with psychotic disorder type not yet determined [facility]) Discharge Diagnoses:  Patient Active Problem List   Diagnosis Date Noted  . Schizophrenia spectrum disorder with psychotic disorder type not yet determined ([place]) [F20.89, F29] 09/24/2015  . Intermittent explosive disorder [F63.81] 09/20/2015  . Severe recurrent major depressive disorder with psychotic features ([place]) [F33.3] 09/20/2015  . Suicidal ideation [R45.851] 09/20/2015  . Homicidal ideation [R45.850] 09/20/2015  . Chest pain at rest [R07.9] 03/19/2013  . Acute non Q wave MI (myocardial infarction), initial episode of care ([place]) [I21.4] 03/06/2013  . Alcohol use disorder, mild, abuse [F10.10] 10/01/2011  . NSTEMI (non-ST elevated myocardial infarction) ([place]) [I21.4] 10/01/2011  . Erectile dysfunction [N52.9] 08/01/2010  . Urge incontinence of urine [N39.41] 08/01/2010  . Chronic alcoholic pancreatitis ([place]) [K86.0] 07/24/2010  . Tobacco use disorder [F17.200] 07/24/2010  . Hypertension [I10] 07/24/2010  . Hyperlipidemia [E78.5] 07/24/2010  . CVA (cerebral vascular accident) ([place]) [I63.9] 07/24/2010  . Chronic back pain [M54.9, G89.29] 07/24/2010    Total Time spent with patient: 30 minutes  Musculoskeletal: Strength & Muscle Tone: within normal limits Gait & Station: normal Patient leans: N/A  Psychiatric Specialty Exam: Review of Systems  All other systems reviewed and are negative.   Blood pressure 130/101, pulse 117, temperature 98.2 F (36.8 C), temperature source Oral, resp. rate 18, height 5\' 11"  (1.803 m), weight 71.215 kg (157 lb), SpO2 100 %.Body mass index is 21.91 kg/(m^2).  General Appearance: Casual  Eye Contact::  Good  Speech:  Clear and [id number]  Volume:  Normal  Mood:  Euthymic  Affect:  Appropriate  Thought Process:  Goal Directed  Orientation:  Full (Time, Place, and Person)  Thought Content:  WDL   Suicidal Thoughts:  No  Homicidal Thoughts:  No  Memory:  Immediate;   Fair Recent;   Fair Remote;   Fair  Judgement:  Impaired  Insight:  Shallow  Psychomotor Activity:  Normal  Concentration:  Fair  Recall:  [place]  Language: Fair  Akathisia:  No  Handed:  Right  AIMS (if indicated):     Assets:  Communication Skills Desire for Improvement Financial Resources/Insurance Housing Intimacy Physical Health Resilience Social Support Transportation  Sleep:  Number of Hours: 7  Cognition: WNL  ADL's:  Intact   Mental Status Per Nursing Assessment::   On Admission:     Demographic Factors:  Male  Loss Factors: Loss of significant relationship and Legal issues  Historical Factors: Impulsivity  Risk Reduction Factors:   Sense of responsibility to family, Living with another person, especially a relative and Positive social support  Continued Clinical Symptoms:  Schizophrenia:   Depressive state  Cognitive Features That Contribute To Risk:  None    Suicide Risk:  Minimal: No identifiable suicidal ideation.  Patients presenting with no risk factors but with morbid ruminations; may be classified as minimal risk based on the severity of the depressive symptoms    Plan Of Care/Follow-up recommendations:  Activity:  as tolerated. Diet:  low sodium heart healthy. Other:  keep follow up appointments.  Orson Slick, MD 09/26/2015, 10:30 AM

## 2015-09-26 NOTE — Tx Team (Signed)
Interdisciplinary Treatment Plan Update (Adult)  Date:  09/26/2015 Time Reviewed:  11:08 AM  Progress in Treatment: Attending groups: Yes. Participating in groups:  Yes. Taking medication as prescribed:  Yes. Tolerating medication:  Yes. Family/Significant othe contact made:  Yes, individual(s) contacted:  wife Patient understands diagnosis:  No. and As evidenced by:  limited insight  Discussing patient identified problems/goals with staff:  Yes. Medical problems stabilized or resolved:  Yes. Denies suicidal/homicidal ideation: Yes. Issues/concerns per patient self-inventory:  Yes. Other:  New problem(s) identified: No, Describe:  NA  Discharge Plan or Barriers: Pt plans to return home and follow up with outpatient.    Reason for Continuation of Hospitalization: Aggression Depression Homicidal ideation Medication stabilization  Comments:[name] denies any symptoms of depression, anxiety, or psychosis. He denies suicidal or homicidal thoughts. He accepts medications and tolerates them well. He participates in programming. Yesterday in treatment team he told us that his wife will be visiting. We were hoping to test their interaction that the patient had been homicidal towards his wife. She could not come. According to the patient he is ready to pick him up.   Estimated length of stay: Pt will likely d/c today.   New goal(s): NA  Review of initial/current patient goals per problem list:   1.  Goal(s): Patient will participate in aftercare plan * Met: yes * Target date: at discharge * As evidenced by: Patient will participate within aftercare plan AEB aftercare provider and housing plan at discharge being identified.   2.  Goal (s): Patient will exhibit decreased depressive symptoms and suicidal ideations. * Met: Yes *  Target date: at discharge * As evidenced by: Patient will utilize self rating of depression at 3 or below and demonstrate decreased signs of depression or be  deemed stable for discharge by MD. *   Attendees: Patient:  [name] 7/13/201711:08 AM  Family:   7/13/201711:08 AM  Physician:   Dr. Bary Leriche  7/13/201711:08 AM  Nursing:   Polly Cobia, RN  7/13/201711:08 AM  Case Manager:   7/13/201711:08 AM  Counselor:   7/13/201711:08 AM  Other:  Wray Kearns, [place] 7/13/201711:08 AM  Other:  Everitt Amber, [place]  7/13/201711:08 AM  Other:   7/13/201711:08 AM  Other:  7/13/201711:08 AM  Other:  7/13/201711:08 AM  Other:  7/13/201711:08 AM  Other:  7/13/201711:08 AM  Other:  7/13/201711:08 AM  Other:  7/13/201711:08 AM  Other:   7/13/201711:08 AM   Scribe for Treatment Team:   Wray Kearns, MSW, [place]  09/26/2015, 11:08 AM

## 2015-09-26 NOTE — BHH Group Notes (Signed)
[facility] Group Notes:  (Nursing/MHT/Case Management/Adjunct)  Date:  09/26/2015  Time:  3:21 AM  Type of Therapy:  Psychoeducational Skills  Participation Level:  Active  Participation Quality:  Appropriate  Affect:  Appropriate  Cognitive:  Appropriate  Insight:  Appropriate and Good  Engagement in Group:  Engaged  Modes of Intervention:  Discussion, Socialization and Support  Summary of Progress/Problems:  Reece Agar 09/26/2015, 3:21 AM

## 2015-09-26 NOTE — Progress Notes (Addendum)
D:Patient aware of discharge this shift . Patient returning home . Patient received all belonging locked up . Patient denies  Suicidal  And homicidal ideations  .  A: Writer instructed on discharge criteria  . Informed and received  Discharge Summary  AVS Form , Suicidal Risk  and prescriptions  given to patient . Aware  Of follow up appointment . R: Patient left unit with no questions  Or concerns

## 2015-10-16 DIAGNOSIS — F209 Schizophrenia, unspecified: Secondary | ICD-10-CM | POA: Diagnosis not present

## 2015-10-16 DIAGNOSIS — Z6822 Body mass index (BMI) 22.0-22.9, adult: Secondary | ICD-10-CM | POA: Diagnosis not present

## 2015-10-16 DIAGNOSIS — G4700 Insomnia, unspecified: Secondary | ICD-10-CM | POA: Diagnosis not present

## 2015-10-16 DIAGNOSIS — G894 Chronic pain syndrome: Secondary | ICD-10-CM | POA: Diagnosis not present

## 2015-10-16 DIAGNOSIS — I2510 Atherosclerotic heart disease of native coronary artery without angina pectoris: Secondary | ICD-10-CM | POA: Diagnosis not present

## 2015-10-16 DIAGNOSIS — Z720 Tobacco use: Secondary | ICD-10-CM | POA: Diagnosis not present

## 2015-10-16 DIAGNOSIS — M545 Low back pain: Secondary | ICD-10-CM | POA: Diagnosis not present

## 2015-10-16 DIAGNOSIS — E785 Hyperlipidemia, unspecified: Secondary | ICD-10-CM | POA: Diagnosis not present

## 2015-10-16 DIAGNOSIS — F319 Bipolar disorder, unspecified: Secondary | ICD-10-CM | POA: Diagnosis not present

## 2016-05-18 ENCOUNTER — Encounter (HOSPITAL_COMMUNITY): Payer: Self-pay | Admitting: Emergency Medicine

## 2016-05-18 ENCOUNTER — Ambulatory Visit (HOSPITAL_COMMUNITY)
Admission: EM | Admit: 2016-05-18 | Discharge: 2016-05-18 | Disposition: A | Discharge status: Home / Self Care | Payer: Medicare PPO | Attending: Family Medicine | Admitting: Family Medicine

## 2016-05-18 DIAGNOSIS — E755 Other lipid storage disorders: Secondary | ICD-10-CM | POA: Diagnosis not present

## 2016-05-18 DIAGNOSIS — H0260 Xanthelasma of unspecified eye, unspecified eyelid: Secondary | ICD-10-CM

## 2016-05-18 NOTE — ED Provider Notes (Signed)
CSN: [id number]     Arrival date & time 05/18/16  1254 History   None    Chief Complaint  Patient presents with  . Eye Problem   (Consider location/radiation/quality/duration/timing/severity/associated sxs/prior Treatment) [place] states he has had lesions on the skin surrounding his eyes. Right eyelid lesion which started about 1 year ago. Left eyelid  lesion which started about a week ago. A couple other lesions noted around the the eyes. Not associated with pain or pruritus.      Past Medical History:  Diagnosis Date  . Chronic pancreatitis ([place])   . Coronary artery disease   . Depression   . Hyperlipidemia   . Hypertension   . Myocardial infarction   . Seizures ([place])   . Stroke ([place])   . Substance abuse    Past Surgical History:  Procedure Laterality Date  . BACK SURGERY    . CORONARY STENT PLACEMENT    . LEFT HEART CATHETERIZATION WITH CORONARY ANGIOGRAM N/A 10/01/2011   Procedure: LEFT HEART CATHETERIZATION WITH CORONARY ANGIOGRAM;  Surgeon: Clent Demark, MD;  Location: [facility] CATH LAB;  Service: Cardiovascular;  Laterality: N/A;  . LEFT HEART CATHETERIZATION WITH CORONARY ANGIOGRAM N/A 03/06/2013   Procedure: LEFT HEART CATHETERIZATION WITH CORONARY ANGIOGRAM;  Surgeon: Clent Demark, MD;  Location: [place] CATH LAB;  Service: Cardiovascular;  Laterality: N/A;  . PERCUTANEOUS CORONARY STENT INTERVENTION (PCI-S) N/A 10/02/2011   Procedure: PERCUTANEOUS CORONARY STENT INTERVENTION (PCI-S);  Surgeon: Clent Demark, MD;  Location: [facility] CATH LAB;  Service: Cardiovascular;  Laterality: N/A;   No family history on file. Social History  Substance Use Topics  . Smoking status: Current Some Day Smoker    Packs/day: 0.33    Years: 35.00    Types: Cigarettes  . Smokeless tobacco: Never Used     Comment: working on quitting  . Alcohol use Yes     Comment: beer every other day    Review of Systems  All other systems reviewed and are negative.   Allergies  Morphine and  related  Home Medications   Prior to Admission medications   Medication Sig Start Date End Date Taking? Authorizing Provider  aspirin EC 81 MG EC tablet Take 1 tablet (81 mg total) by mouth daily. 03/08/13   Charolette Forward, MD  atorvastatin (LIPITOR) 80 MG tablet Take 80 mg by mouth daily.    Historical Provider, MD  clopidogrel (PLAVIX) 75 MG tablet Take 75 mg by mouth daily.    Historical Provider, MD  diclofenac (VOLTAREN) 75 MG EC tablet Take 75 mg by mouth daily.     Historical Provider, MD  gabapentin (NEURONTIN) 300 MG capsule Take 300 mg by mouth daily.     Historical Provider, MD  mirtazapine (REMERON) 15 MG tablet Take 1 tablet (15 mg total) by mouth at bedtime. 09/26/15   Clovis Fredrickson, MD  OXcarbazepine (TRILEPTAL) 300 MG tablet Take 1 tablet (300 mg total) by mouth 2 (two) times daily. 09/26/15   Clovis Fredrickson, MD  risperiDONE (RISPERDAL) 3 MG tablet Take 1 tablet (3 mg total) by mouth 2 (two) times daily. 09/26/15   Clovis Fredrickson, MD  zolpidem (AMBIEN) 5 MG tablet Take 1 tablet (5 mg total) by mouth at bedtime as needed for sleep. 09/26/15   Clovis Fredrickson, MD   Meds Ordered and Administered this Visit  Medications - No data to display  BP 153/82 (BP Location: Right Arm)   Pulse 84   Temp 99 F (37.2  C) (Oral)   Resp 18   SpO2 98%  No data found.   Physical Exam  Constitutional: He is oriented to person, place, and time. He appears well-developed and well-nourished.  HENT:  Head: Normocephalic and atraumatic.  Eyes: EOM are normal. Pupils are equal, round, and reactive to light.  Neck: Normal range of motion. Neck supple.  Cardiovascular: Normal rate, regular rhythm and normal heart sounds.   Pulmonary/Chest: Effort normal and breath sounds normal.  Abdominal: Soft. Bowel sounds are normal.  Neurological: He is alert and oriented to person, place, and time.  Skin:  Yellow plaques on the inner right side of the skin surrounding the eye.   Right eyelid papule  Left eyelid papule  Yellow plaque noted near lower eyelid  No pain on palpation noted with either eyelid plaque    Urgent Care Course     Procedures (including critical care time)  Labs Review Labs Reviewed - No data to display  Imaging Review No results found.    MDM   1. Xanthoma of eyelid    Discussed with patient that these xanthoma's are likely due to elevated cholesterol. [facility]    Tonette Bihari, MD 05/18/16 [phone]

## 2016-05-18 NOTE — ED Triage Notes (Signed)
Patient has bumps on both eyelids that have developed over time, the first one was noticed over 6 months ago.  No change in vision

## 2016-05-30 DIAGNOSIS — J410 Simple chronic bronchitis: Secondary | ICD-10-CM | POA: Diagnosis not present

## 2016-05-30 DIAGNOSIS — E1142 Type 2 diabetes mellitus with diabetic polyneuropathy: Secondary | ICD-10-CM | POA: Diagnosis not present

## 2016-05-30 DIAGNOSIS — F3131 Bipolar disorder, current episode depressed, mild: Secondary | ICD-10-CM | POA: Diagnosis not present

## 2016-05-30 DIAGNOSIS — G4700 Insomnia, unspecified: Secondary | ICD-10-CM | POA: Diagnosis not present

## 2016-05-30 DIAGNOSIS — G40909 Epilepsy, unspecified, not intractable, without status epilepticus: Secondary | ICD-10-CM | POA: Diagnosis not present

## 2016-05-30 DIAGNOSIS — F209 Schizophrenia, unspecified: Secondary | ICD-10-CM | POA: Diagnosis not present

## 2016-05-30 DIAGNOSIS — E785 Hyperlipidemia, unspecified: Secondary | ICD-10-CM | POA: Diagnosis not present

## 2016-05-30 DIAGNOSIS — F17200 Nicotine dependence, unspecified, uncomplicated: Secondary | ICD-10-CM | POA: Diagnosis not present

## 2016-05-30 DIAGNOSIS — I252 Old myocardial infarction: Secondary | ICD-10-CM | POA: Diagnosis not present

## 2017-03-26 DIAGNOSIS — F418 Other specified anxiety disorders: Secondary | ICD-10-CM | POA: Diagnosis not present

## 2017-03-26 DIAGNOSIS — E1142 Type 2 diabetes mellitus with diabetic polyneuropathy: Secondary | ICD-10-CM | POA: Diagnosis not present

## 2017-03-26 DIAGNOSIS — E1136 Type 2 diabetes mellitus with diabetic cataract: Secondary | ICD-10-CM | POA: Diagnosis not present

## 2017-03-26 DIAGNOSIS — E1165 Type 2 diabetes mellitus with hyperglycemia: Secondary | ICD-10-CM | POA: Diagnosis not present

## 2017-03-26 DIAGNOSIS — J449 Chronic obstructive pulmonary disease, unspecified: Secondary | ICD-10-CM | POA: Diagnosis not present

## 2017-03-26 DIAGNOSIS — I10 Essential (primary) hypertension: Secondary | ICD-10-CM | POA: Diagnosis not present

## 2017-03-26 DIAGNOSIS — I252 Old myocardial infarction: Secondary | ICD-10-CM | POA: Diagnosis not present

## 2017-03-26 DIAGNOSIS — E785 Hyperlipidemia, unspecified: Secondary | ICD-10-CM | POA: Diagnosis not present

## 2017-03-26 DIAGNOSIS — I2510 Atherosclerotic heart disease of native coronary artery without angina pectoris: Secondary | ICD-10-CM | POA: Diagnosis not present

## 2017-07-12 ENCOUNTER — Encounter: Payer: Self-pay | Admitting: Family Medicine

## 2017-07-12 ENCOUNTER — Ambulatory Visit (INDEPENDENT_AMBULATORY_CARE_PROVIDER_SITE_OTHER): Payer: Medicare PPO | Admitting: Family Medicine

## 2017-07-12 ENCOUNTER — Other Ambulatory Visit: Payer: Self-pay

## 2017-07-12 VITALS — BP 104/60 | HR 133 | Temp 98.6°F | Resp 18 | Ht 71.0 in | Wt 158.6 lb

## 2017-07-12 DIAGNOSIS — K122 Cellulitis and abscess of mouth: Secondary | ICD-10-CM | POA: Diagnosis not present

## 2017-07-12 DIAGNOSIS — F17200 Nicotine dependence, unspecified, uncomplicated: Secondary | ICD-10-CM | POA: Diagnosis not present

## 2017-07-12 DIAGNOSIS — Z8639 Personal history of other endocrine, nutritional and metabolic disease: Secondary | ICD-10-CM | POA: Diagnosis not present

## 2017-07-12 DIAGNOSIS — J029 Acute pharyngitis, unspecified: Secondary | ICD-10-CM

## 2017-07-12 DIAGNOSIS — F1010 Alcohol abuse, uncomplicated: Secondary | ICD-10-CM | POA: Diagnosis not present

## 2017-07-12 LAB — POCT CBC
Granulocyte percent: 81.4 %G — AB (ref 37–80)
HCT, POC: 43.2 % — AB (ref 43.5–53.7)
HEMOGLOBIN: 13.9 g/dL — AB (ref 14.1–18.1)
LYMPH, POC: 1.7 (ref 0.6–3.4)
MCH, POC: 29.1 pg (ref 27–31.2)
MCHC: 32.1 g/dL (ref 31.8–35.4)
MCV: 90.8 fL (ref 80–97)
MID (cbc): 0.7 (ref 0–0.9)
MPV: 8.4 fL (ref 0–99.8)
POC Granulocyte: 10.4 — AB (ref 2–6.9)
POC LYMPH %: 13.1 % (ref 10–50)
POC MID %: 5.5 %M (ref 0–12)
Platelet Count, POC: 225 10*3/uL (ref 142–424)
RBC: 4.76 M/uL (ref 4.69–6.13)
RDW, POC: 14.6 %
WBC: 12.8 10*3/uL — AB (ref 4.6–10.2)

## 2017-07-12 LAB — POCT RAPID STREP A (OFFICE): RAPID STREP A SCREEN: NEGATIVE

## 2017-07-12 LAB — POCT SKIN KOH: Skin KOH, POC: NEGATIVE

## 2017-07-12 MED ORDER — CEFTRIAXONE SODIUM 1 G IJ SOLR
1.0000 g | Freq: Once | INTRAMUSCULAR | Status: AC
  Administered 2017-07-12: 1 g via INTRAMUSCULAR

## 2017-07-12 MED ORDER — AMOXICILLIN-POT CLAVULANATE 875-125 MG PO TABS: 1.0000 | ORAL_TABLET | Freq: Two times a day (BID) | ORAL | 0 refills | Status: DC

## 2017-07-12 NOTE — Progress Notes (Signed)
07/12/2017 10:55 AM Dr. Brigitte Pulse asked me to inspect a likely abscess in the roof of this patient's mouth.  On exam he is exquisitely tender about the roof of the mouth where a fluctuant mass measuring roughly 1/2 cm x 1/2 cm is residing.  Risk and benefits discussed and verbal consent obtained.  Patient anesthetized with viscous lidocaine swish and spit.  The mass was lanced with an 11 blade and a culture was taken.  Suction which was used to prevent aspiration of the purulent fluid.  Patient tolerated the procedure well.  Charges dropped in the system for a simple incision and drainage.  Philis Fendt, MS, PA-C 10:57 AM, 07/12/2017

## 2017-07-12 NOTE — Patient Instructions (Addendum)
IF you received an x-ray today, you will receive an invoice from [facility]. Please contact [facility] at [phone] with questions or concerns regarding your invoice.   IF you received labwork today, you will receive an invoice from [facility]. Please contact [facility] at [phone] with questions or concerns regarding your invoice.   Our billing staff will not be able to assist you with questions regarding bills from these companies.  You will be contacted with the lab results as soon as they are available. The fastest way to get your results is to activate your My Chart account. Instructions are located on the last page of this paperwork. If you have not heard from [place] regarding the results in 2 weeks, please contact this office.     Oral Abscess An abscess is a collection of yellowish-white fluid (pus). It usually occurs when an infection spreads into the tissues. What are the causes? The infection that leads to an abscess is usually caused by streptococcal bacteria. What are the signs or symptoms?  Sore throat, often with pain on just one side.  Swelling and tenderness of the glands (lymph nodes) in the neck.  Difficulty swallowing.  Difficulty opening your mouth.  Fever.  Chills.  Drooling because of difficulty swallowing saliva.  Headache.  Changes in your voice.  Bad breath. How is this diagnosed? Your health care provider will take your medical history and do a physical exam. Imaging tests may be done, such as an ultrasound or CT scan. A sample of pus may be removed from the abscess using a needle (needle aspiration) or by swabbing the back of your throat. This sample will be sent to a lab for testing. How is this treated? Treatment usually involves draining the pus from the abscess. This may be done through needle aspiration or by making an incision in the abscess. You will also likely need to take antibiotic medicine. Follow these  instructions at home:  Rest as much as possible and get plenty of sleep.  Take medicines only as directed by your health care provider.  If you were prescribed an antibiotic medicine, finish it all even if you start to feel better.  If your abscess was drained by your health care provider, gargle with a mixture of salt and warm water: ? Mix 1 tsp of salt in 8 oz of warm water. ? Gargle with this mixture four times per day or as needed for comfort. ? Do not swallow this mixture.  Drink plenty of fluids.  While your throat is sore, eat soft or liquid foods, such as frozen ice pops and ice cream.  Keep all follow-up visits as directed by your health care provider. This is important. Contact a health care provider if:  You have increased pain, swelling, redness, or drainage in your throat.  You develop a headache, a lack of energy (lethargy), or generalized feelings of illness.  You have a fever.  You feel dizzy.  You have difficulty swallowing or eating.  You show signs of becoming dehydrated, such as: ? Light-headedness when standing. ? Decreased urine output. ? A fast heart rate. ? Dry mouth. Get help right away if:  You have difficulty talking or breathing, or you find it easier to breathe when you lean forward.  You are coughing up blood or vomiting blood.  You have severe throat pain that is not helped by medicines.  You start to drool. This information is not intended to replace advice given to  you by your health care provider. Make sure you discuss any questions you have with your health care provider. Document Released: 03/02/2005 Document Revised: 08/08/2015 Document Reviewed: 10/16/2013 Elsevier Interactive Patient Education  2018 [employer].

## 2017-07-12 NOTE — Progress Notes (Addendum)
Subjective:  By signing my name below, I, [name], attest that this documentation has been prepared under the direction and in the presence of Delman Cheadle, MD. Electronically Signed: [name], [place]. 07/12/2017 , 9:37 AM .  Patient was seen in Room 3 .   Patient ID: [name], male    DOB: 25-Apr-1960, 57 y.o.   MRN: [id number] Chief Complaint  Patient presents with  . Mouth Pain    x2 days, Pt states pain is in the roof of his mouth and states it feel likes a golf ball and has a knot.   HPI [name] is a 57 y.o. male who presents to Primary Care at [facility] complaining of pain over the roof of his mouth that started 2 days ago. Patient describes sensation feeling like there's a golf ball inside. He's been having difficulty with swallowing and hasn't eaten for about 2 days. He reports pain in his mouth and his throat, with headache all over, fever, chills, and non-productive cough. He denies nasal congestion, nausea, vomiting, muscle aches or abdominal pain. He's been taking tylenol for the pain.   He plans to see a dentist later today, planning for a walk-in.   NCCSRS:  Zolpidem - 02/2016  #45 tramadol - 07/2015   Past Medical History:  Diagnosis Date  . Chronic pancreatitis ([place])   . Coronary artery disease   . Depression   . Hyperlipidemia   . Hypertension   . Myocardial infarction ([place])   . Seizures ([place])   . Stroke ([place])   . Substance abuse [facility])    Past Surgical History:  Procedure Laterality Date  . BACK SURGERY    . CORONARY STENT PLACEMENT    . LEFT HEART CATHETERIZATION WITH CORONARY ANGIOGRAM N/A 10/01/2011   Procedure: LEFT HEART CATHETERIZATION WITH CORONARY ANGIOGRAM;  Surgeon: Clent Demark, MD;  Location: [facility] CATH LAB;  Service: Cardiovascular;  Laterality: N/A;  . LEFT HEART CATHETERIZATION WITH CORONARY ANGIOGRAM N/A 03/06/2013   Procedure: LEFT HEART CATHETERIZATION WITH CORONARY ANGIOGRAM;  Surgeon: Clent Demark, MD;   Location: [facility] CATH LAB;  Service: Cardiovascular;  Laterality: N/A;  . PERCUTANEOUS CORONARY STENT INTERVENTION (PCI-S) N/A 10/02/2011   Procedure: PERCUTANEOUS CORONARY STENT INTERVENTION (PCI-S);  Surgeon: Clent Demark, MD;  Location: [facility] CATH LAB;  Service: Cardiovascular;  Laterality: N/A;   Prior to Admission medications   Medication Sig Start Date End Date Taking? Authorizing Provider  aspirin EC 81 MG EC tablet Take 1 tablet (81 mg total) by mouth daily. 03/08/13   Charolette Forward, MD  atorvastatin (LIPITOR) 80 MG tablet Take 80 mg by mouth daily.    [provider]  clopidogrel (PLAVIX) 75 MG tablet Take 75 mg by mouth daily.    [provider]  diclofenac (VOLTAREN) 75 MG EC tablet Take 75 mg by mouth daily.     [provider]  gabapentin (NEURONTIN) 300 MG capsule Take 300 mg by mouth daily.     [provider]  mirtazapine (REMERON) 15 MG tablet Take 1 tablet (15 mg total) by mouth at bedtime. 09/26/15   Pucilowska, Jolanta B, MD  OXcarbazepine (TRILEPTAL) 300 MG tablet Take 1 tablet (300 mg total) by mouth 2 (two) times daily. 09/26/15   Pucilowska, Jolanta B, MD  risperiDONE (RISPERDAL) 3 MG tablet Take 1 tablet (3 mg total) by mouth 2 (two) times daily. 09/26/15   Pucilowska, Jolanta B, MD  zolpidem (AMBIEN) 5 MG tablet Take 1 tablet (5  mg total) by mouth at bedtime as needed for sleep. 09/26/15   Pucilowska, Wardell Honour, MD   Allergies  Allergen Reactions  . Morphine And Related Itching   History reviewed. No pertinent family history. Social History   Socioeconomic History  . Marital status: Married    Spouse name: Not on file  . Number of children: Not on file  . Years of education: Not on file  . Highest education level: Not on file  Occupational History  . Not on file  Social Needs  . Financial resource strain: Not on file  . Food insecurity:    Worry: Not on file    Inability: Not on file  . Transportation needs:    Medical: Not on  file    Non-medical: Not on file  Tobacco Use  . Smoking status: Current Some Day Smoker    Packs/day: 0.33    Years: 35.00    Pack years: 11.55    Types: Cigarettes  . Smokeless tobacco: Never Used  . Tobacco comment: working on quitting  Substance and Sexual Activity  . Alcohol use: Yes    Comment: beer every other day  . Drug use: No  . Sexual activity: Not on file  Lifestyle  . Physical activity:    Days per week: Not on file    Minutes per session: Not on file  . Stress: Not on file  Relationships  . Social connections:    Talks on phone: Not on file    Gets together: Not on file    Attends religious service: Not on file    Active member of club or organization: Not on file    Attends meetings of clubs or organizations: Not on file    Relationship status: Not on file  Other Topics Concern  . Not on file  Social History Narrative   The patient lives alone with his significant other, Jonnie Finner.  The patient has a 69 year old daughter.  He is currently unemployed, and  has a high school degree.        He has a history of 1- 1.5 pack per day smoking of many years.  He states he has recently cut down to half pack per day (06/2010).      He has a history of heavy alcohol use, states typically drinking a fifth to a pint of gin daily, after stroke in 06/2010, decreased to one pint of gin per week, and after hospitalization for acute on chronic pancreatitis, was abstinent for at least 1-2 weeks (from 5/7 to 5/17)        He denies any illicit drug use.    Depression screen [facility] 2/9 07/12/2017  Decreased Interest 0  Down, Depressed, Hopeless 0  PHQ - 2 Score 0    Review of Systems  Constitutional: Positive for appetite change, chills and fever.  HENT: Positive for mouth sores, sore throat and trouble swallowing. Negative for congestion.   Respiratory: Positive for cough. Negative for shortness of breath and wheezing.   Gastrointestinal: Negative for abdominal pain, nausea and  vomiting.  Musculoskeletal: Negative for myalgias.  Neurological: Positive for headaches.       Objective:   Physical Exam  Constitutional: He is oriented to person, place, and time. He appears well-developed and well-nourished. No distress.  HENT:  Head: Normocephalic and atraumatic.  1x2cm tight painful fluctuant mass on roof of mouth, with induration extending to the left inner aspect of the gumline with superficial dermis debriding; oropharynx and  tonsils were mildly edematous with significant erythema, tongue with very thick white adherent coating  Eyes: Pupils are equal, round, and reactive to light. EOM are normal.  Neck: Neck supple.  Cardiovascular: Normal rate.  Pulmonary/Chest: Effort normal. No respiratory distress.  Musculoskeletal: Normal range of motion.  Neurological: He is alert and oriented to person, place, and time.  Skin: Skin is warm and dry.  Psychiatric: He has a normal mood and affect. His behavior is normal.  Nursing note and vitals reviewed.   BP 104/60 (BP Location: Left Arm, Patient Position: Sitting, Cuff Size: Normal)   Pulse (!) 133   Temp 98.6 F (37 C) (Oral)   Resp 18   Ht 5\' 11"  (1.803 m)   Wt 158 lb 9.6 oz (71.9 kg)   SpO2 100%   BMI 22.12 kg/m   Results for orders placed or performed in visit on 07/12/17  POCT CBC  Result Value Ref Range   WBC 12.8 (A) 4.6 - 10.2 K/uL   Lymph, poc 1.7 0.6 - 3.4   POC LYMPH PERCENT 13.1 10 - 50 %L   MID (cbc) 0.7 0 - 0.9   POC MID % 5.5 0 - 12 %M   POC Granulocyte 10.4 (A) 2 - 6.9   Granulocyte percent 81.4 (A) 37 - 80 %G   RBC 4.76 4.69 - 6.13 M/uL   Hemoglobin 13.9 (A) 14.1 - 18.1 g/dL   HCT, POC 43.2 (A) 43.5 - 53.7 %   MCV 90.8 80 - 97 fL   MCH, POC 29.1 27 - 31.2 pg   MCHC 32.1 31.8 - 35.4 g/dL   RDW, POC 14.6 %   Platelet Count, POC 225 142 - 424 K/uL   MPV 8.4 0 - 99.8 fL  POCT rapid strep A  Result Value Ref Range   Rapid Strep A Screen Negative Negative  POCT Skin KOH  Result Value  Ref Range   Skin KOH, POC Negative Negative  No THRUSH - KOH done on tongue scraping     Assessment & Plan:   1. Abscess of oral space   2. Acute pharyngitis, unspecified etiology   3. Tobacco use disorder   4. Alcohol use disorder, mild, abuse   5. History of diabetes mellitus   Have culture from tonsils and hard palate P as well as wound culture from purulent drainage during I&D. Ceftriaxone x 1 today, start augmentin x 10d. Rec freq salt water gargles - info given in AVS.   Recheck in 2d to ensure the abscess hasn't reaccumulated and need repeat drainage. If not distinctly better or any systemic sxs at f/u OV, could consider repeating cbc and rocephin prn while Clxs are still P.  Refer to ENT for further eval considering very odd location with well-circumscribed abscess at apex of hard palate so does not appear to be dental in origin - want to r/o underlying disorder such as malignancy or mucosal disease that could have developed abscess as complication other than his known risk factors of tobacco use, diabetes, poss EtOH abuse, and poor oral hygeine w/ no dental care.   Pt adament that he is going to see dentist today though does not have dentist nor an appointment w/ one.  Pt c/o severe pain and inability to swallow due to pain though was able to take ibuprofen and feels able to swallow abx pills.  Will avoid any CNS depressents in this pt - advised to use otc meds that have been cleared as ok  by PCP in past. In Epic, pt has a cardiologist whi did his cath 5 yrs ago listed as his PCP, currently seen at [facility] for care. Is very poor historian with noted h/o chronic EtoH abuse, noncompliance, and mental health issues.  Orders Placed This Encounter  Procedures  . Culture, Group A Strep    Order Specific Question:   Source    Answer:   oropharynx  . WOUND CULTURE    Order Specific Question:   Source    Answer:   roof of mouth, hard palate abscess  . Ambulatory referral to ENT    Referral  Priority:   Urgent    Referral Type:   Consultation    Referral Reason:   Specialty Services Required    Requested Specialty:   Otolaryngology    Number of Visits Requested:   1  . POCT CBC  . POCT rapid strep A  . POCT Skin KOH    Meds ordered this encounter  Medications  . cefTRIAXone (ROCEPHIN) injection 1 g  . amoxicillin-clavulanate (AUGMENTIN) 875-125 MG tablet    Sig: Take 1 tablet by mouth 2 (two) times daily.    Dispense:  20 tablet    Refill:  0    I personally performed the services described in this documentation, which was scribed in my presence. The recorded information has been reviewed and considered, and addended by me as needed.   Delman Cheadle, M.D.  Primary Care at [facility] [address] [place], [place] [address] [phone] phone [phone] fax  07/14/17 1:51 AM

## 2017-07-14 ENCOUNTER — Other Ambulatory Visit: Payer: Self-pay

## 2017-07-14 ENCOUNTER — Encounter: Payer: Self-pay | Admitting: Family Medicine

## 2017-07-14 ENCOUNTER — Ambulatory Visit (INDEPENDENT_AMBULATORY_CARE_PROVIDER_SITE_OTHER): Payer: Medicare PPO | Admitting: Physician Assistant

## 2017-07-14 ENCOUNTER — Encounter: Payer: Self-pay | Admitting: Physician Assistant

## 2017-07-14 VITALS — BP 130/70 | HR 105 | Temp 98.8°F | Resp 18 | Ht 71.0 in | Wt 155.2 lb

## 2017-07-14 DIAGNOSIS — K122 Cellulitis and abscess of mouth: Secondary | ICD-10-CM

## 2017-07-14 NOTE — Progress Notes (Signed)
07/14/2017 10:51 AM   DOB: [birth year] / MRN: [id number]  SUBJECTIVE:  [name] is a 57 y.o. male presenting for recheck of abscess in the roof of his mouth.  He was seen by Dr. Brigitte Pulse 2 days ago at which point the abscess was drained.  Work-up at that time was notable for a mild leukocytosis with neutrophilic predominance.  He reports feeling much better today.  He tells me "the spot is almost gone."  He is allergic to morphine and related.   He  has a past medical history of Anxiety, Chronic pancreatitis ([place]), Coronary artery disease, Depression, Diabetes mellitus without complication ([place]), Hyperlipidemia, Hypertension, Myocardial infarction [facility]), Neuromuscular disorder ([place]), Seizures ([place]), Stroke ([place]), and Substance abuse ([place]).    He  reports that he has been smoking cigarettes.  He has a 40.00 pack-year smoking history. He has never used smokeless tobacco. He reports that he drinks alcohol. He reports that he does not use drugs. He  has no sexual activity history on file. The patient  has a past surgical history that includes Back surgery; Coronary stent placement; left heart catheterization with coronary angiogram (N/A, 10/01/2011); percutaneous coronary stent intervention (pci-s) (N/A, 10/02/2011); and left heart catheterization with coronary angiogram (N/A, 03/06/2013).  His family history is not on file.  Review of Systems  Constitutional: Negative for diaphoresis, malaise/fatigue and weight loss.  Gastrointestinal: Negative for nausea.  Skin: Negative for rash.  Neurological: Negative for dizziness.    The problem list and medications were reviewed and updated by myself where necessary and exist elsewhere in the encounter.   OBJECTIVE:  BP 130/70 (BP Location: Left Arm, Patient Position: Sitting, Cuff Size: Normal)   Pulse (!) 105   Temp 98.8 F (37.1 C) (Oral)   Resp 18   Ht 5\' 11"  (1.803 m)   Wt 155 lb 3.2 oz (70.4 kg)   SpO2 100%   BMI 21.65 kg/m    Pulse Readings from Last 3 Encounters:  07/14/17 (!) 105  07/12/17 (!) 133  05/18/16 84     Physical Exam  Constitutional: He is oriented to person, place, and time. He appears well-developed. He does not appear ill.  HENT:  Mouth/Throat: He does not have dentures. No oral lesions. No trismus in the jaw. Normal dentition. No dental abscesses, uvula swelling, lacerations or dental caries. No oropharyngeal exudate, posterior oropharyngeal edema, posterior oropharyngeal erythema or tonsillar abscesses. Tonsils are 1+ on the right. Tonsils are 1+ on the left. No tonsillar exudate.    Eyes: Pupils are equal, round, and reactive to light. Conjunctivae and EOM are normal.  Cardiovascular: Normal rate.  Pulmonary/Chest: Effort normal.  Abdominal: He exhibits no distension.  Musculoskeletal: Normal range of motion.  Neurological: He is alert and oriented to person, place, and time. No cranial nerve deficit. Coordination normal.  Skin: Skin is warm and dry. He is not diaphoretic.  Psychiatric: He has a normal mood and affect.  Nursing note and vitals reviewed.   Results for orders placed or performed in visit on 07/12/17 (from the past 72 hour(s))  POCT CBC     Status: Abnormal   Collection Time: 07/12/17  9:44 AM  Result Value Ref Range   WBC 12.8 (A) 4.6 - 10.2 K/uL   Lymph, poc 1.7 0.6 - 3.4   POC LYMPH PERCENT 13.1 10 - 50 %L   MID (cbc) 0.7 0 - 0.9   POC MID % 5.5 0 - 12 %M   POC Granulocyte  10.4 (A) 2 - 6.9   Granulocyte percent 81.4 (A) 37 - 80 %G   RBC 4.76 4.69 - 6.13 M/uL   Hemoglobin 13.9 (A) 14.1 - 18.1 g/dL   HCT, POC 43.2 (A) 43.5 - 53.7 %   MCV 90.8 80 - 97 fL   MCH, POC 29.1 27 - 31.2 pg   MCHC 32.1 31.8 - 35.4 g/dL   RDW, POC 14.6 %   Platelet Count, POC 225 142 - 424 K/uL   MPV 8.4 0 - 99.8 fL  POCT Skin KOH     Status: Normal   Collection Time: 07/12/17  9:54 AM  Result Value Ref Range   Skin KOH, POC Negative Negative  POCT rapid strep A     Status:  Normal   Collection Time: 07/12/17  9:55 AM  Result Value Ref Range   Rapid Strep A Screen Negative Negative  WOUND CULTURE     Status: None (Preliminary result)   Collection Time: 07/12/17 10:22 AM  Result Value Ref Range   Gram Stain Result Final report    Organism ID, Bacteria Comment     Comment: Moderate amount of white blood cells.   Organism ID, Bacteria No organisms seen    Aerobic Bacterial Culture Preliminary report    Organism ID, Bacteria Comment     Comment: Microbiological testing to rule out the presence of possible pathogens is in progress.    Organism ID, Bacteria Routine flora     Comment: Moderate growth  Culture, Group A Strep     Status: None (Preliminary result)   Collection Time: 07/12/17 11:15 AM  Result Value Ref Range   Strep A Culture Comment     Comment: Microbiological testing to rule out the presence of possible pathogens is in progress.     No results found.  ASSESSMENT AND PLAN:  [name] was seen today for abscess and follow-up.  Diagnoses and all orders for this visit:  Abscess of oral space patient improving with incision and drainage and p.o. antibiotics.  I have advised he continue the antibiotics.  He does need to see ENT as he is a chronic smoker and daily drinker.  However his symptoms may represent malignancy and this needs to be ruled out. -     Care order/instruction:    The patient is advised to call or return to clinic if he does not see an improvement in symptoms, or to seek the care of the closest emergency department if he worsens with the above plan.   Philis Fendt, MHS, PA-C [facility] 07/14/2017 10:51 AM

## 2017-07-14 NOTE — Patient Instructions (Addendum)
I am glad the abscess is doing better.  There is no need to come back here for that problem.  Please make sure that she continue taking the antibiotic as prescribed until the medicine is completely gone.  Please make sure that you go to the ENT appointment.    IF you received an x-ray today, you will receive an invoice from [facility]. Please contact [facility] at [phone] with questions or concerns regarding your invoice.   IF you received labwork today, you will receive an invoice from [facility]. Please contact [facility] at [phone] with questions or concerns regarding your invoice.   Our billing staff will not be able to assist you with questions regarding bills from these companies.  You will be contacted with the lab results as soon as they are available. The fastest way to get your results is to activate your My Chart account. Instructions are located on the last page of this paperwork. If you have not heard from [place] regarding the results in 2 weeks, please contact this office.

## 2017-07-15 ENCOUNTER — Telehealth: Payer: Self-pay | Admitting: Physician Assistant

## 2017-07-15 LAB — WOUND CULTURE: ORGANISM ID, BACTERIA: NONE SEEN

## 2017-07-15 LAB — CULTURE, GROUP A STREP

## 2017-07-15 NOTE — Telephone Encounter (Signed)
This is what the PEC put in, but it is according to how the visit is coded when it comes to a wound/abcess.  If its just wound care, most of the time there is no copay for the follow up.  It is according to what the provider has to do and the coding.    Copied from [place] [phone]. Topic: Referral - Status >> Jul 15, 2017  1:47 PM [name] wrote: Reason for CRM: pt refused today to be seen Ramos/c he had to pay a copay of $35, call pt if needed

## 2017-09-10 ENCOUNTER — Emergency Department (HOSPITAL_COMMUNITY)
Admission: EM | Admit: 2017-09-10 | Discharge: 2017-09-10 | Disposition: A | Discharge status: Home / Self Care | Payer: 59 | Attending: Emergency Medicine | Admitting: Emergency Medicine

## 2017-09-10 ENCOUNTER — Encounter (HOSPITAL_COMMUNITY): Payer: Self-pay | Admitting: Emergency Medicine

## 2017-09-10 ENCOUNTER — Other Ambulatory Visit: Payer: Self-pay

## 2017-09-10 DIAGNOSIS — M5432 Sciatica, left side: Secondary | ICD-10-CM | POA: Diagnosis not present

## 2017-09-10 DIAGNOSIS — Z5321 Procedure and treatment not carried out due to patient leaving prior to being seen by health care provider: Secondary | ICD-10-CM | POA: Diagnosis not present

## 2017-09-10 NOTE — ED Notes (Signed)
Pt not found in room. Seen by other staff leaving ED. Smitty Knudsen, RN he was going to [place]. Dr. Rogene Houston notified.

## 2017-09-10 NOTE — ED Notes (Signed)
Pt reports lower back pain radiating to right leg since Monday. Denies recent injury. Movement makes pain worse. Denies numbness/tingling/loss of bowel/bladder continence.

## 2017-09-10 NOTE — ED Triage Notes (Signed)
Pt reports right lower back pain that radiates to his buttock and down his leg, started Monday, no known injuries, hx of the same. Took hydrocodone this morning with no relief. Ambulatory to exam room with minor difficulty.

## 2017-09-13 ENCOUNTER — Other Ambulatory Visit: Payer: Self-pay

## 2017-09-13 ENCOUNTER — Emergency Department (HOSPITAL_COMMUNITY)
Admission: EM | Admit: 2017-09-13 | Discharge: 2017-09-13 | Disposition: A | Discharge status: Home / Self Care | Payer: Medicare PPO | Attending: Emergency Medicine | Admitting: Emergency Medicine

## 2017-09-13 ENCOUNTER — Encounter (HOSPITAL_COMMUNITY): Payer: Self-pay | Admitting: Emergency Medicine

## 2017-09-13 DIAGNOSIS — M5431 Sciatica, right side: Secondary | ICD-10-CM

## 2017-09-13 DIAGNOSIS — Z8673 Personal history of transient ischemic attack (TIA), and cerebral infarction without residual deficits: Secondary | ICD-10-CM | POA: Diagnosis not present

## 2017-09-13 DIAGNOSIS — F329 Major depressive disorder, single episode, unspecified: Secondary | ICD-10-CM | POA: Insufficient documentation

## 2017-09-13 DIAGNOSIS — F17210 Nicotine dependence, cigarettes, uncomplicated: Secondary | ICD-10-CM | POA: Insufficient documentation

## 2017-09-13 DIAGNOSIS — Z7982 Long term (current) use of aspirin: Secondary | ICD-10-CM | POA: Insufficient documentation

## 2017-09-13 DIAGNOSIS — I252 Old myocardial infarction: Secondary | ICD-10-CM | POA: Diagnosis not present

## 2017-09-13 DIAGNOSIS — Z7902 Long term (current) use of antithrombotics/antiplatelets: Secondary | ICD-10-CM | POA: Insufficient documentation

## 2017-09-13 DIAGNOSIS — I10 Essential (primary) hypertension: Secondary | ICD-10-CM | POA: Insufficient documentation

## 2017-09-13 DIAGNOSIS — E119 Type 2 diabetes mellitus without complications: Secondary | ICD-10-CM | POA: Insufficient documentation

## 2017-09-13 DIAGNOSIS — M5441 Lumbago with sciatica, right side: Secondary | ICD-10-CM | POA: Diagnosis not present

## 2017-09-13 DIAGNOSIS — I2510 Atherosclerotic heart disease of native coronary artery without angina pectoris: Secondary | ICD-10-CM | POA: Diagnosis not present

## 2017-09-13 DIAGNOSIS — F419 Anxiety disorder, unspecified: Secondary | ICD-10-CM | POA: Insufficient documentation

## 2017-09-13 DIAGNOSIS — Z79899 Other long term (current) drug therapy: Secondary | ICD-10-CM | POA: Insufficient documentation

## 2017-09-13 DIAGNOSIS — M79604 Pain in right leg: Secondary | ICD-10-CM | POA: Diagnosis present

## 2017-09-13 MED ORDER — KETOROLAC TROMETHAMINE 60 MG/2ML IM SOLN
15.0000 mg | Freq: Once | INTRAMUSCULAR | Status: AC
  Administered 2017-09-13: 15 mg via INTRAMUSCULAR
  Filled 2017-09-13: qty 2

## 2017-09-13 MED ORDER — METHOCARBAMOL 500 MG PO TABS: 500.0000 mg | ORAL_TABLET | Freq: Two times a day (BID) | ORAL | 0 refills | Status: AC

## 2017-09-13 NOTE — ED Triage Notes (Signed)
Patient to ED c/o R lower back pain radiating down R buttock and R leg x 1 week, denies injury. Reports hx sciatica. No numbness/tingling or incontinence. Ambulatory with slow, steady gait.

## 2017-09-13 NOTE — ED Provider Notes (Signed)
[facility] EMERGENCY DEPARTMENT Provider Note   CSN: [id number] Arrival date & time: 09/13/17  1748     History   Chief Complaint Chief Complaint  Patient presents with  . Sciatica    HPI [name] is a 57 y.o. male presenting for a one-week history of right leg pain.  Patient states that pain began gradually last Monday 5 days.  Patient states pain is a sharp shooting pain that travels from his right gluteal area down the back of his right leg to his right lower leg.  Patient states that pain is worse with walking and moving the leg.  Patient denies history of injury/trauma.  Patient denies numbness/tingling.  Patient denies saddle area paresthesias or urinary incontinence/retention or fecal incontinence.  Patient denies history of fever. Patient checked into 09/10/2017 but left without being seen by provider.  Patient states that he has a history of diabetes but does not regularly check his blood sugars.  Patient denies history of kidney disease. Patient states he has had similar pain in the past but on his other leg, states that he had surgery approximately 7 years ago and healed well afterwards.  HPI  Past Medical History:  Diagnosis Date  . Anxiety   . Chronic pancreatitis ([place])   . Coronary artery disease   . Depression   . Diabetes mellitus without complication ([place])   . Hyperlipidemia   . Hypertension   . Myocardial infarction ([place])   . Neuromuscular disorder ([place])   . Seizures ([place])   . Stroke ([place])   . Substance abuse [facility])     Patient Active Problem List   Diagnosis Date Noted  . Schizophrenia spectrum disorder with psychotic disorder type not yet determined ([place]) 09/24/2015  . Intermittent explosive disorder 09/20/2015  . Severe recurrent major depressive disorder with psychotic features ([place]) 09/20/2015  . Suicidal ideation 09/20/2015  . Homicidal ideation 09/20/2015  . Chest pain at rest 03/19/2013  . Acute non Q wave MI  (myocardial infarction), initial episode of care ([place]) 03/06/2013  . Chronic sciatica of left side 09/10/2012  . Personality disorder ([place]) 11/10/2011  . Alcohol use disorder, mild, abuse 10/01/2011  . NSTEMI (non-ST elevated myocardial infarction) ([place]) 10/01/2011  . Erectile dysfunction 08/01/2010  . Urge incontinence of urine 08/01/2010  . Chronic alcoholic pancreatitis ([place]) 07/24/2010  . Tobacco use disorder 07/24/2010  . Hypertension 07/24/2010  . Hyperlipidemia 07/24/2010  . CVA (cerebral vascular accident) ([place]) 07/24/2010  . Chronic back pain 07/24/2010    Past Surgical History:  Procedure Laterality Date  . BACK SURGERY    . CORONARY STENT PLACEMENT    . LEFT HEART CATHETERIZATION WITH CORONARY ANGIOGRAM N/A 10/01/2011   Procedure: LEFT HEART CATHETERIZATION WITH CORONARY ANGIOGRAM;  Surgeon: Clent Demark, MD;  Location: [facility] C[facility] LAB;  Service: Cardiovascular;  Laterality: N/A;  . LEFT HEART CATHETERIZATION WITH CORONARY ANGIOGRAM N/A 03/06/2013   Procedure: LEFT HEART CATHETERIZATION WITH CORONARY ANGIOGRAM;  Surgeon: Clent Demark, MD;  Location: [facility] CATH LAB;  Service: Cardiovascular;  Laterality: N/A;  . PERCUTANEOUS CORONARY STENT INTERVENTION (PCI-S) N/A 10/02/2011   Procedure: PERCUTANEOUS CORONARY STENT INTERVENTION (PCI-S);  Surgeon: Clent Demark, MD;  Location: [facility] CATH LAB;  Service: Cardiovascular;  Laterality: N/A;        Home Medications    Prior to Admission medications   Medication Sig Start Date End Date Taking? Authorizing Provider  amoxicillin (AMOXIL) 500 MG capsule Take 2,000 mg by mouth See admin instructions.  Take 2,000 mg by mouth one hour prior to dental appt 08/06/17   [provider]  amoxicillin-clavulanate (AUGMENTIN) 875-125 MG tablet Take 1 tablet by mouth 2 (two) times daily. Patient not taking: Reported on 09/10/2017 07/12/17   Shawnee Knapp, MD  aspirin EC 81 MG EC tablet Take 1 tablet (81 mg total) by mouth daily. 03/08/13    Charolette Forward, MD  atorvastatin (LIPITOR) 80 MG tablet Take 80 mg by mouth daily.    [provider]  clopidogrel (PLAVIX) 75 MG tablet Take 75 mg by mouth daily.    [provider]  diclofenac (VOLTAREN) 75 MG EC tablet Take 75 mg by mouth daily.     [provider]  gabapentin (NEURONTIN) 300 MG capsule Take 300 mg by mouth daily.     [provider]  HYDROcodone-Acetaminophen 5-300 MG TABS Take 1 tablet by mouth See admin instructions. Take 1 tablet by mouth every four to six hours as needed for pain 08/06/17   [provider]  methocarbamol (ROBAXIN) 500 MG tablet Take 1 tablet (500 mg total) by mouth 2 (two) times daily for 10 days. 09/13/17 09/23/17  Nuala Alpha A, PA-C  mirtazapine (REMERON) 15 MG tablet Take 1 tablet (15 mg total) by mouth at bedtime. 09/26/15   Pucilowska, Jolanta B, MD  OXcarbazepine (TRILEPTAL) 300 MG tablet Take 1 tablet (300 mg total) by mouth 2 (two) times daily. 09/26/15   Pucilowska, Jolanta B, MD  risperiDONE (RISPERDAL) 3 MG tablet Take 1 tablet (3 mg total) by mouth 2 (two) times daily. 09/26/15   Pucilowska, Herma Ard B, MD  ticagrelor (BRILINTA) 90 MG TABS tablet Take 1 tablet by mouth 2 (two) times daily.    [provider]  zolpidem (AMBIEN) 5 MG tablet Take 1 tablet (5 mg total) by mouth at bedtime as needed for sleep. 09/26/15   Clovis Fredrickson, MD    Family History No family history on file.  Social History Social History   Tobacco Use  . Smoking status: Current Every Day Smoker    Packs/day: 1.00    Years: 40.00    Pack years: 40.00    Types: Cigarettes  . Smokeless tobacco: Never Used  Substance Use Topics  . Alcohol use: Yes    Comment: 1-2/week  . Drug use: Never     Allergies   Morphine and related   Review of Systems Review of Systems  Constitutional: Negative.  Negative for chills, fatigue and fever.  HENT: Negative.  Negative for congestion, ear pain, rhinorrhea, sore  throat and trouble swallowing.   Eyes: Negative.  Negative for visual disturbance.  Respiratory: Negative.  Negative for cough, chest tightness and shortness of breath.   Cardiovascular: Negative.  Negative for chest pain and leg swelling.  Gastrointestinal: Negative.  Negative for abdominal pain, blood in stool, diarrhea, nausea and vomiting.  Genitourinary: Negative.  Negative for dysuria, flank pain and hematuria.  Musculoskeletal: Positive for back pain. Negative for arthralgias, myalgias and neck pain.  Skin: Negative.  Negative for rash.  Neurological: Negative.  Negative for dizziness, syncope, weakness, light-headedness and headaches.     Physical Exam Updated Vital Signs BP (!) 150/82 (BP Location: Right Arm)   Pulse 91   Temp 99.4 F (37.4 C) (Oral)   Resp 14   SpO2 97%   Physical Exam  Constitutional: He is oriented to person, place, and time. He appears well-developed and well-nourished. No distress.  HENT:  Head: Normocephalic and atraumatic.  Eyes: Pupils are equal, round, and reactive to light.  Neck: Normal range of motion. Neck supple.  Cardiovascular: Normal rate and regular rhythm.  Pulmonary/Chest: Effort normal and breath sounds normal. No respiratory distress.  Abdominal: Soft. There is no tenderness. There is no rebound and no guarding.  Musculoskeletal:       Thoracic back: He exhibits no bony tenderness and no deformity.       Lumbar back: He exhibits no bony tenderness and no deformity.       Back:  Tenderness to palpation of the right gluteal area. Positive right-sided straight leg test.  No midline spinal TTP, no paraspinal muscle tenderness, no deformity, crepitus, or step-off noted. No tenderness to the hips, hips stable   Neurological: He is alert and oriented to person, place, and time. He has normal strength. No sensory deficit.  Skin: Skin is warm and dry.  Psychiatric: He has a normal mood and affect. His behavior is normal.    ED  Treatments / Results  Labs (all labs ordered are listed, but only abnormal results are displayed) Labs Reviewed - No data to display  EKG None  Radiology No results found.  Procedures Procedures (including critical care time)  Medications Ordered in ED Medications  ketorolac (TORADOL) injection 15 mg (15 mg Intramuscular Given 09/13/17 2000)     Initial Impression / Assessment and Plan / ED Course  I have reviewed the triage vital signs and the nursing notes.  Pertinent labs & imaging results that were available during my care of the patient were reviewed by me and considered in my medical decision making (see chart for details).   Patient's history and presentation suggestive of lumbosacral radicular pain, positive straight leg test, no signs of injury or infection.  Will treat in department with Toradol injection and reassess.  Plan to discharge patient with prescription for muscle relaxers and have patient follow-up with his primary care provider.  Due to patient's history of diabetes I do not feel comfortable treating patient with steroids today.  Patient encouraged to follow-up with his primary care provider for treatment of his diagnoses.  Plan to reassess patient after Toradol injection.  Patient reassessed, able to stand and ambulate around room with mild pain.  Patient informed of care plan, discharge with primary care follow-up as well is prescription for Robaxin.  Patient states understanding the care plan and is agreeable, patient states understanding not to operate heavy machinery or drive while taking Robaxin.  Patient with back pain.  No neurological deficits and normal neuro exam.  Patient can walk but states is painful.  No loss of bowel or bladder control.  No concern for cauda equina.  No fever, night sweats, weight loss, h/o cancer, IVDU.    At this time there does not appear to be any evidence of an acute emergency medical condition and the patient appears stable for  discharge with appropriate outpatient follow up. Diagnosis was discussed with patient who verbalizes understanding and is agreeable to discharge. I have discussed return precautions with patient who verbalizes understanding of return precautions. Patient strongly encouraged to follow-up with their PCP.  Patient's case discussed with Alyse Low PA-C who agrees with plan to discharge with follow-up.    Final Clinical Impressions(s) / ED Diagnoses   Final diagnoses:  Right sided sciatica    ED Discharge Orders        Ordered    methocarbamol (ROBAXIN) 500 MG tablet  2 times daily  09/13/17 2013       Deliah Boston, PA-C 09/14/17 Margaretha Glassing    Charlesetta Shanks, MD 09/15/17 [phone]

## 2017-09-13 NOTE — ED Notes (Signed)
See EDP assessment 

## 2017-09-13 NOTE — ED Notes (Signed)
Pt verbalized understanding discharge instructions and denies any further needs or questions at this time. VS stable, ambulatory and steady gait.   See EDP assessment / note.   

## 2017-09-13 NOTE — Discharge Instructions (Signed)
Call to schedule follow-up appointment with your primary care provider tomorrow. Return to the emergency department for any new or worsening symptoms. Do not drive or operate heavy machinery while taking Robaxin.  Contact a doctor if: You have pain that: Wakes you up when you are sleeping. Gets worse when you lie down. Is worse than the pain you have had in the past. Lasts longer than 4 weeks. You lose weight for without trying. Get help right away if: You cannot control when you pee (urinate) or poop (have a bowel movement). You have weakness in any of these areas and it gets worse. Lower back. Lower belly (pelvis). Butt (buttocks). Legs. You have redness or swelling of your back. You have a burning feeling when you pee.

## 2017-12-06 ENCOUNTER — Encounter (HOSPITAL_COMMUNITY): Payer: Self-pay

## 2017-12-06 ENCOUNTER — Ambulatory Visit (HOSPITAL_COMMUNITY)
Admission: EM | Admit: 2017-12-06 | Discharge: 2017-12-06 | Disposition: A | Discharge status: Home / Self Care | Payer: Medicare PPO | Attending: Family Medicine | Admitting: Family Medicine

## 2017-12-06 DIAGNOSIS — Z202 Contact with and (suspected) exposure to infections with a predominantly sexual mode of transmission: Secondary | ICD-10-CM | POA: Diagnosis not present

## 2017-12-06 DIAGNOSIS — Z79899 Other long term (current) drug therapy: Secondary | ICD-10-CM | POA: Diagnosis not present

## 2017-12-06 DIAGNOSIS — I252 Old myocardial infarction: Secondary | ICD-10-CM | POA: Diagnosis not present

## 2017-12-06 DIAGNOSIS — Z8673 Personal history of transient ischemic attack (TIA), and cerebral infarction without residual deficits: Secondary | ICD-10-CM | POA: Insufficient documentation

## 2017-12-06 DIAGNOSIS — Z885 Allergy status to narcotic agent status: Secondary | ICD-10-CM | POA: Diagnosis not present

## 2017-12-06 DIAGNOSIS — F419 Anxiety disorder, unspecified: Secondary | ICD-10-CM | POA: Diagnosis not present

## 2017-12-06 DIAGNOSIS — Z7982 Long term (current) use of aspirin: Secondary | ICD-10-CM | POA: Insufficient documentation

## 2017-12-06 DIAGNOSIS — K859 Acute pancreatitis without necrosis or infection, unspecified: Secondary | ICD-10-CM | POA: Insufficient documentation

## 2017-12-06 DIAGNOSIS — E785 Hyperlipidemia, unspecified: Secondary | ICD-10-CM | POA: Insufficient documentation

## 2017-12-06 DIAGNOSIS — R569 Unspecified convulsions: Secondary | ICD-10-CM | POA: Insufficient documentation

## 2017-12-06 DIAGNOSIS — Z113 Encounter for screening for infections with a predominantly sexual mode of transmission: Secondary | ICD-10-CM

## 2017-12-06 DIAGNOSIS — E119 Type 2 diabetes mellitus without complications: Secondary | ICD-10-CM | POA: Insufficient documentation

## 2017-12-06 DIAGNOSIS — I10 Essential (primary) hypertension: Secondary | ICD-10-CM | POA: Diagnosis not present

## 2017-12-06 DIAGNOSIS — Z711 Person with feared health complaint in whom no diagnosis is made: Secondary | ICD-10-CM

## 2017-12-06 DIAGNOSIS — F17210 Nicotine dependence, cigarettes, uncomplicated: Secondary | ICD-10-CM | POA: Diagnosis not present

## 2017-12-06 DIAGNOSIS — I2510 Atherosclerotic heart disease of native coronary artery without angina pectoris: Secondary | ICD-10-CM | POA: Insufficient documentation

## 2017-12-06 MED ORDER — LIDOCAINE HCL (PF) 1 % IJ SOLN
INTRAMUSCULAR | Status: AC
  Filled 2017-12-06: qty 2

## 2017-12-06 MED ORDER — AZITHROMYCIN 250 MG PO TABS
1000.0000 mg | ORAL_TABLET | Freq: Once | ORAL | Status: AC
  Administered 2017-12-06: 1000 mg via ORAL

## 2017-12-06 MED ORDER — AZITHROMYCIN 250 MG PO TABS
ORAL_TABLET | ORAL | Status: AC
  Filled 2017-12-06: qty 4

## 2017-12-06 MED ORDER — CEFTRIAXONE SODIUM 250 MG IJ SOLR
250.0000 mg | Freq: Once | INTRAMUSCULAR | Status: AC
  Administered 2017-12-06: 250 mg via INTRAMUSCULAR

## 2017-12-06 MED ORDER — CEFTRIAXONE SODIUM 250 MG IJ SOLR
INTRAMUSCULAR | Status: AC
  Filled 2017-12-06: qty 250

## 2017-12-06 NOTE — Discharge Instructions (Addendum)
We have treated you today for gonorrhea and chlamydia.  Further testing is pending. Will notify you of any positive findings and if any changes to treatment are needed.  You may also monitor your results on your MyChart.  Please withhold from intercourse for the next week. Please use condoms to prevent STD's.   If symptoms worsen or do not improve in the next week to return to be seen or to follow up with your PCP.

## 2017-12-06 NOTE — ED Provider Notes (Signed)
[facility]    CSN: [id number] Arrival date & time: 12/06/17  6962     History   Chief Complaint Chief Complaint  Patient presents with  . STD Testing    HPI [name] is a 57 y.o. male.   [name] presents with complaints of concern for STD. States "he doesn't feel right down there" indicating genitalia. States he is unable to describe the sensation he experiencing. Denies burning or tingling to penis or with urination. No urinary frequency. No abdominal or back pain. Denies sores, lesions, open areas. Denies scrotal redness or swelling. States he had an encounter with a partner last week without condom use with intercourse which is concerned about. Does not know of any specific std exposure. States he has had similar symptoms in the past with a "venereal disease" but is uncertain which. Hx of anxiety, pancreatitis, CAD, depression, DM, hyperlipidemia, htn, seizures, stroke, substance abuse.    ROS per HPI.      Past Medical History:  Diagnosis Date  . Anxiety   . Chronic pancreatitis ([place])   . Coronary artery disease   . Depression   . Diabetes mellitus without complication ([place])   . Hyperlipidemia   . Hypertension   . Myocardial infarction ([place])   . Neuromuscular disorder ([place])   . Seizures ([place])   . Stroke ([place])   . Substance abuse [facility])     Patient Active Problem List   Diagnosis Date Noted  . Schizophrenia spectrum disorder with psychotic disorder type not yet determined ([place]) 09/24/2015  . Intermittent explosive disorder 09/20/2015  . Severe recurrent major depressive disorder with psychotic features ([place]) 09/20/2015  . Suicidal ideation 09/20/2015  . Homicidal ideation 09/20/2015  . Chest pain at rest 03/19/2013  . Acute non Q wave MI (myocardial infarction), initial episode of care ([place]) 03/06/2013  . Chronic sciatica of left side 09/10/2012  . Personality disorder ([place]) 11/10/2011  . Alcohol use disorder, mild, abuse 10/01/2011  .  NSTEMI (non-ST elevated myocardial infarction) ([place]) 10/01/2011  . Erectile dysfunction 08/01/2010  . Urge incontinence of urine 08/01/2010  . Chronic alcoholic pancreatitis ([place]) 07/24/2010  . Tobacco use disorder 07/24/2010  . Hypertension 07/24/2010  . Hyperlipidemia 07/24/2010  . CVA (cerebral vascular accident) ([place]) 07/24/2010  . Chronic back pain 07/24/2010    Past Surgical History:  Procedure Laterality Date  . BACK SURGERY    . CORONARY STENT PLACEMENT    . LEFT HEART CATHETERIZATION WITH CORONARY ANGIOGRAM N/A 10/01/2011   Procedure: LEFT HEART CATHETERIZATION WITH CORONARY ANGIOGRAM;  Surgeon: Clent Demark, MD;  Location: [facility] CATH LAB;  Service: Cardiovascular;  Laterality: N/A;  . LEFT HEART CATHETERIZATION WITH CORONARY ANGIOGRAM N/A 03/06/2013   Procedure: LEFT HEART CATHETERIZATION WITH CORONARY ANGIOGRAM;  Surgeon: Clent Demark, MD;  Location: [facility] CATH LAB;  Service: Cardiovascular;  Laterality: N/A;  . PERCUTANEOUS CORONARY STENT INTERVENTION (PCI-S) N/A 10/02/2011   Procedure: PERCUTANEOUS CORONARY STENT INTERVENTION (PCI-S);  Surgeon: Clent Demark, MD;  Location: [facility] CATH LAB;  Service: Cardiovascular;  Laterality: N/A;       Home Medications    Prior to Admission medications   Medication Sig Start Date End Date Taking? Authorizing Provider  aspirin EC 81 MG EC tablet Take 1 tablet (81 mg total) by mouth daily. 03/08/13   Charolette Forward, MD  atorvastatin (LIPITOR) 80 MG tablet Take 80 mg by mouth daily.    [provider]  clopidogrel (PLAVIX) 75 MG tablet Take 75 mg by  mouth daily.    [provider]  diclofenac (VOLTAREN) 75 MG EC tablet Take 75 mg by mouth daily.     [provider]  gabapentin (NEURONTIN) 300 MG capsule Take 300 mg by mouth daily.     [provider]  HYDROcodone-Acetaminophen 5-300 MG TABS Take 1 tablet by mouth See admin instructions. Take 1 tablet by mouth every four to six hours as needed for pain  08/06/17   [provider]  mirtazapine (REMERON) 15 MG tablet Take 1 tablet (15 mg total) by mouth at bedtime. 09/26/15   Pucilowska, Jolanta B, MD  OXcarbazepine (TRILEPTAL) 300 MG tablet Take 1 tablet (300 mg total) by mouth 2 (two) times daily. 09/26/15   Pucilowska, Jolanta B, MD  risperiDONE (RISPERDAL) 3 MG tablet Take 1 tablet (3 mg total) by mouth 2 (two) times daily. 09/26/15   Pucilowska, Herma Ard B, MD  ticagrelor (BRILINTA) 90 MG TABS tablet Take 1 tablet by mouth 2 (two) times daily.    [provider]  zolpidem (AMBIEN) 5 MG tablet Take 1 tablet (5 mg total) by mouth at bedtime as needed for sleep. 09/26/15   Clovis Fredrickson, MD    Family History History reviewed. No pertinent family history.  Social History Social History   Tobacco Use  . Smoking status: Current Every Day Smoker    Packs/day: 1.00    Years: 40.00    Pack years: 40.00    Types: Cigarettes  . Smokeless tobacco: Never Used  Substance Use Topics  . Alcohol use: Yes    Comment: 1-2/week  . Drug use: Never     Allergies   Morphine and related   Review of Systems Review of Systems   Physical Exam Triage Vital Signs ED Triage Vitals  Enc Vitals Group     BP 12/06/17 0830 (!) 159/84     Pulse Rate 12/06/17 0830 90     Resp 12/06/17 0830 16     Temp 12/06/17 0830 98.7 F (37.1 C)     Temp Source 12/06/17 0830 Oral     SpO2 12/06/17 0830 100 %     Weight --      Height --      Head Circumference --      Peak Flow --      Pain Score 12/06/17 0832 0     Pain Loc --      Pain Edu? --      Excl. in [place]? --    No data found.  Updated Vital Signs BP (!) 159/84 (BP Location: Right Arm) Comment: reported BP to nurse Cortney Connor  Pulse 90   Temp 98.7 F (37.1 C) (Oral)   Resp 16   SpO2 100%    Physical Exam  Constitutional: He is oriented to person, place, and time. He appears well-developed and well-nourished.  Cardiovascular: Normal rate and regular rhythm.    Pulmonary/Chest: Effort normal and breath sounds normal.  Abdominal: Soft. There is no tenderness. There is no rigidity, no rebound, no guarding, no CVA tenderness, no tenderness at McBurney's point and negative Murphy's sign.  Denies scrotal redness, swelling, pain; denies sores or lesions; gu exam deferred   Neurological: He is alert and oriented to person, place, and time.  Skin: Skin is warm and dry.     UC Treatments / Results  Labs (all labs ordered are listed, but only abnormal results are displayed) Labs Reviewed  RPR  HIV ANTIBODY (ROUTINE TESTING W REFLEX)  URINE CYTOLOGY ANCILLARY ONLY    EKG None  Radiology No results found.  Procedures Procedures (including critical care time)  Medications Ordered in UC Medications  azithromycin (ZITHROMAX) tablet 1,000 mg (has no administration in time range)  cefTRIAXone (ROCEPHIN) injection 250 mg (has no administration in time range)    Initial Impression / Assessment and Plan / UC Course  I have reviewed the triage vital signs and the nursing notes.  Pertinent labs & imaging results that were available during my care of the patient were reviewed by me and considered in my medical decision making (see chart for details).     Patient requests empiric treatment related to recent unprotected intercourse. hiv and rpr pending as well. Urine cytology pending. Will notify of any positive findings and if any changes to treatment are needed. If symptoms worsen or do not improve in the next week to return to be seen or to follow up with PCP.   Patient verbalized understanding and agreeable to plan.    Final Clinical Impressions(s) / UC Diagnoses   Final diagnoses:  Concern about STD in male without diagnosis     Discharge Instructions     We have treated you today for gonorrhea and chlamydia.  Further testing is pending. Will notify you of any positive findings and if any changes to treatment are needed.  You may also  monitor your results on your MyChart.  Please withhold from intercourse for the next week. Please use condoms to prevent STD's.   If symptoms worsen or do not improve in the next week to return to be seen or to follow up with your PCP.     ED Prescriptions    None     Controlled Substance Prescriptions [place] Controlled Substance Registry consulted? Not Applicable   Zigmund Gottron, NP 12/06/17 [phone]

## 2017-12-06 NOTE — ED Triage Notes (Signed)
Pt presents to get STD Testing.  Pt states he has no pain or symptoms.

## 2017-12-07 LAB — HIV ANTIBODY (ROUTINE TESTING W REFLEX): HIV SCREEN 4TH GENERATION: NONREACTIVE

## 2017-12-07 LAB — RPR: RPR Ser Ql: NONREACTIVE

## 2017-12-08 LAB — URINE CYTOLOGY ANCILLARY ONLY
Chlamydia: NEGATIVE
Neisseria Gonorrhea: NEGATIVE
Trichomonas: NEGATIVE

## 2018-04-09 DIAGNOSIS — F418 Other specified anxiety disorders: Secondary | ICD-10-CM | POA: Diagnosis not present

## 2018-04-09 DIAGNOSIS — M62838 Other muscle spasm: Secondary | ICD-10-CM | POA: Diagnosis not present

## 2018-04-09 DIAGNOSIS — I252 Old myocardial infarction: Secondary | ICD-10-CM | POA: Diagnosis not present

## 2018-04-09 DIAGNOSIS — I2510 Atherosclerotic heart disease of native coronary artery without angina pectoris: Secondary | ICD-10-CM | POA: Diagnosis not present

## 2018-04-09 DIAGNOSIS — E1142 Type 2 diabetes mellitus with diabetic polyneuropathy: Secondary | ICD-10-CM | POA: Diagnosis not present

## 2018-04-09 DIAGNOSIS — J449 Chronic obstructive pulmonary disease, unspecified: Secondary | ICD-10-CM | POA: Diagnosis not present

## 2018-04-09 DIAGNOSIS — M545 Low back pain: Secondary | ICD-10-CM | POA: Diagnosis not present

## 2018-04-09 DIAGNOSIS — E785 Hyperlipidemia, unspecified: Secondary | ICD-10-CM | POA: Diagnosis not present

## 2018-04-09 DIAGNOSIS — I10 Essential (primary) hypertension: Secondary | ICD-10-CM | POA: Diagnosis not present

## 2018-10-30 ENCOUNTER — Other Ambulatory Visit: Payer: Self-pay

## 2018-10-30 ENCOUNTER — Emergency Department (HOSPITAL_COMMUNITY)
Admission: EM | Admit: 2018-10-30 | Discharge: 2018-10-31 | Disposition: A | Discharge status: Home / Self Care | Payer: Medicare PPO | Attending: Emergency Medicine | Admitting: Emergency Medicine

## 2018-10-30 ENCOUNTER — Encounter (HOSPITAL_COMMUNITY): Payer: Self-pay | Admitting: Emergency Medicine

## 2018-10-30 DIAGNOSIS — I10 Essential (primary) hypertension: Secondary | ICD-10-CM | POA: Diagnosis not present

## 2018-10-30 DIAGNOSIS — Z7982 Long term (current) use of aspirin: Secondary | ICD-10-CM | POA: Insufficient documentation

## 2018-10-30 DIAGNOSIS — Z7902 Long term (current) use of antithrombotics/antiplatelets: Secondary | ICD-10-CM | POA: Insufficient documentation

## 2018-10-30 DIAGNOSIS — M5431 Sciatica, right side: Secondary | ICD-10-CM

## 2018-10-30 DIAGNOSIS — F17210 Nicotine dependence, cigarettes, uncomplicated: Secondary | ICD-10-CM | POA: Insufficient documentation

## 2018-10-30 DIAGNOSIS — Z955 Presence of coronary angioplasty implant and graft: Secondary | ICD-10-CM | POA: Diagnosis not present

## 2018-10-30 DIAGNOSIS — I259 Chronic ischemic heart disease, unspecified: Secondary | ICD-10-CM | POA: Insufficient documentation

## 2018-10-30 DIAGNOSIS — M545 Low back pain: Secondary | ICD-10-CM | POA: Diagnosis present

## 2018-10-30 DIAGNOSIS — E119 Type 2 diabetes mellitus without complications: Secondary | ICD-10-CM | POA: Diagnosis not present

## 2018-10-30 DIAGNOSIS — M5441 Lumbago with sciatica, right side: Secondary | ICD-10-CM | POA: Diagnosis not present

## 2018-10-30 HISTORY — DX: Sciatica, unspecified side: M54.30

## 2018-10-30 NOTE — ED Triage Notes (Signed)
C/o R sided sciatica pain since Friday.  Taking muscle relaxer without relief.

## 2018-10-31 ENCOUNTER — Ambulatory Visit (INDEPENDENT_AMBULATORY_CARE_PROVIDER_SITE_OTHER)
Admission: EM | Admit: 2018-10-31 | Discharge: 2018-10-31 | Disposition: A | Discharge status: Home / Self Care | Payer: Medicare PPO | Source: Home / Self Care | Attending: Family Medicine | Admitting: Family Medicine

## 2018-10-31 ENCOUNTER — Encounter (HOSPITAL_COMMUNITY): Payer: Self-pay

## 2018-10-31 DIAGNOSIS — M5431 Sciatica, right side: Secondary | ICD-10-CM | POA: Diagnosis not present

## 2018-10-31 MED ORDER — TRAMADOL HCL 50 MG PO TABS: 50.0000 mg | ORAL_TABLET | Freq: Four times a day (QID) | ORAL | 0 refills | Status: DC | PRN

## 2018-10-31 MED ORDER — PREDNISONE 20 MG PO TABS: ORAL_TABLET | ORAL | 0 refills | Status: DC

## 2018-10-31 MED ORDER — OXYCODONE-ACETAMINOPHEN 5-325 MG PO TABS
1.0000 | ORAL_TABLET | Freq: Once | ORAL | Status: AC
  Administered 2018-10-31: 1 via ORAL
  Filled 2018-10-31: qty 1

## 2018-10-31 MED ORDER — KETOROLAC TROMETHAMINE 30 MG/ML IJ SOLN
30.0000 mg | Freq: Once | INTRAMUSCULAR | Status: AC
  Administered 2018-10-31: 30 mg via INTRAMUSCULAR
  Filled 2018-10-31: qty 1

## 2018-10-31 MED ORDER — TIZANIDINE HCL 4 MG PO TABS: 4.0000 mg | ORAL_TABLET | Freq: Four times a day (QID) | ORAL | 0 refills | Status: DC | PRN

## 2018-10-31 MED ORDER — KETOROLAC TROMETHAMINE 60 MG/2ML IM SOLN
60.0000 mg | Freq: Once | INTRAMUSCULAR | Status: AC
  Administered 2018-10-31: 60 mg via INTRAMUSCULAR

## 2018-10-31 MED ORDER — OXYCODONE-ACETAMINOPHEN 5-325 MG PO TABS: 2.0000 | ORAL_TABLET | Freq: Four times a day (QID) | ORAL | 0 refills | Status: DC | PRN

## 2018-10-31 MED ORDER — KETOROLAC TROMETHAMINE 60 MG/2ML IM SOLN
INTRAMUSCULAR | Status: AC
  Filled 2018-10-31: qty 2

## 2018-10-31 NOTE — Discharge Instructions (Signed)
Take the prescribed medication as directed.  Can continue your muscle relaxer when needed as well. Follow-up with your primary care doctor. Return to the ED for new or worsening symptoms.

## 2018-10-31 NOTE — ED Notes (Signed)
Patient able to ambulate independently  

## 2018-10-31 NOTE — ED Triage Notes (Signed)
Pt presents with recurrent right side sciatic pain since Friday.

## 2018-10-31 NOTE — Discharge Instructions (Signed)
Starting tomorrow take prednisone 2 times a day for 5 days.  After this take 1 a day until gone When you are finished with prednisone you would take a non-steroid anti-inflammatory like ibuprofen or Aleve Take the muscle relaxer as directed.  Take at bedtime. Take Percocet as needed for severe pain.  Do not drive while taking Percocet.  Make sure you take a stool softener, Metamucil, or bran cereal while on the Percocet.  It can cause constipation Activity as tolerated.  Resisted the temptation for bedrest Call the [facility] hospital tomorrow to arrange follow-up Take the gabapentin medication 3 times a day until your pain improves

## 2018-10-31 NOTE — ED Provider Notes (Signed)
[place]    CSN: [id number] Arrival date & time: 10/31/18  1753      History   Chief Complaint Chief Complaint  Patient presents with  . Sciatic Pain    HPI [name] is a 58 y.o. male.   HPI Patient usually goes to the [place] for his medical care.  He has been unable to get in because of the coronavirus. He has chronic degenerative disc disease in his low back and has had surgery in his low back.  He has periodic flares of sciatica.  Currently he has had a flare of sciatica since Friday (4 days).  Severe pain from his low back through the buttock and down to the right foot.  No numbness.  No weakness.  Pain with any movement.  He has been spending most of his time resting. Went to the emergency room yesterday.  Got a shot of Toradol and had an oxycodone.  Gave him 4 hours of relief.  He given prednisone to take as well as tramadol.  He states these are not helping.  He is in severe pain. I checked the [facility] for narcotic medications.  He has not had any since 2019.  Past Medical History:  Diagnosis Date  . Anxiety   . Chronic pancreatitis ([place])   . Coronary artery disease   . Depression   . Diabetes mellitus without complication ([place])   . Hyperlipidemia   . Hypertension   . Myocardial infarction ([place])   . Neuromuscular disorder ([place])   . Sciatica   . Seizures ([place])   . Stroke ([place])   . Substance abuse [facility])     Patient Active Problem List   Diagnosis Date Noted  . Schizophrenia spectrum disorder with psychotic disorder type not yet determined ([place]) 09/24/2015  . Intermittent explosive disorder 09/20/2015  . Severe recurrent major depressive disorder with psychotic features ([place]) 09/20/2015  . Suicidal ideation 09/20/2015  . Homicidal ideation 09/20/2015  . Chest pain at rest 03/19/2013  . Acute non Q wave MI (myocardial infarction), initial episode of care ([place]) 03/06/2013  . Chronic sciatica of left side 09/10/2012  .  Personality disorder ([place]) 11/10/2011  . Alcohol use disorder, mild, abuse 10/01/2011  . NSTEMI (non-ST elevated myocardial infarction) ([place]) 10/01/2011  . Erectile dysfunction 08/01/2010  . Urge incontinence of urine 08/01/2010  . Chronic alcoholic pancreatitis ([place]) 07/24/2010  . Tobacco use disorder 07/24/2010  . Hypertension 07/24/2010  . Hyperlipidemia 07/24/2010  . CVA (cerebral vascular accident) ([place]) 07/24/2010  . Chronic back pain 07/24/2010    Past Surgical History:  Procedure Laterality Date  . BACK SURGERY    . CORONARY STENT PLACEMENT    . LEFT HEART CATHETERIZATION WITH CORONARY ANGIOGRAM N/A 10/01/2011   Procedure: LEFT HEART CATHETERIZATION WITH CORONARY ANGIOGRAM;  Surgeon: Clent Demark, MD;  Location: [facility] CATH LAB;  Service: Cardiovascular;  Laterality: N/A;  . LEFT HEART CATHETERIZATION WITH CORONARY ANGIOGRAM N/A 03/06/2013   Procedure: LEFT HEART CATHETERIZATION WITH CORONARY ANGIOGRAM;  Surgeon: Clent Demark, MD;  Location: [facility] CATH LAB;  Service: Cardiovascular;  Laterality: N/A;  . PERCUTANEOUS CORONARY STENT INTERVENTION (PCI-S) N/A 10/02/2011   Procedure: PERCUTANEOUS CORONARY STENT INTERVENTION (PCI-S);  Surgeon: Clent Demark, MD;  Location: [facility] CATH LAB;  Service: Cardiovascular;  Laterality: N/A;       Home Medications    Prior to Admission medications   Medication Sig Start Date End Date Taking? Authorizing Provider  aspirin EC 81 MG  EC tablet Take 1 tablet (81 mg total) by mouth daily. 03/08/13   Charolette Forward, MD  atorvastatin (LIPITOR) 80 MG tablet Take 80 mg by mouth daily.    [provider]  clopidogrel (PLAVIX) 75 MG tablet Take 75 mg by mouth daily.    [provider]  gabapentin (NEURONTIN) 300 MG capsule Take 300 mg by mouth daily.     [provider]  mirtazapine (REMERON) 15 MG tablet Take 1 tablet (15 mg total) by mouth at bedtime. 09/26/15   Pucilowska, Jolanta B, MD  OXcarbazepine (TRILEPTAL) 300 MG  tablet Take 1 tablet (300 mg total) by mouth 2 (two) times daily. 09/26/15   Pucilowska, Herma Ard B, MD  oxyCODONE-acetaminophen (PERCOCET/ROXICET) 5-325 MG tablet Take 2 tablets by mouth every 6 (six) hours as needed for severe pain. 10/31/18   Raylene Everts, MD  predniSONE (DELTASONE) 20 MG tablet Take 2 times a day for 5 days then one a day until gone 10/31/18   Raylene Everts, MD  risperiDONE (RISPERDAL) 3 MG tablet Take 1 tablet (3 mg total) by mouth 2 (two) times daily. 09/26/15   Pucilowska, Herma Ard B, MD  ticagrelor (BRILINTA) 90 MG TABS tablet Take 1 tablet by mouth 2 (two) times daily.    [provider]  tiZANidine (ZANAFLEX) 4 MG tablet Take 1-2 tablets (4-8 mg total) by mouth every 6 (six) hours as needed for muscle spasms. 10/31/18   Raylene Everts, MD  zolpidem (AMBIEN) 5 MG tablet Take 1 tablet (5 mg total) by mouth at bedtime as needed for sleep. 09/26/15   Pucilowska, Wardell Honour, MD    Family History Family History  Family history unknown: Yes    Social History Social History   Tobacco Use  . Smoking status: Current Every Day Smoker    Packs/day: 1.00    Years: 40.00    Pack years: 40.00    Types: Cigarettes  . Smokeless tobacco: Never Used  Substance Use Topics  . Alcohol use: Yes    Comment: 1-2/week  . Drug use: Never     Allergies   Morphine and related   Review of Systems Review of Systems  Constitutional: Negative for chills and fever.  HENT: Negative for ear pain and sore throat.   Eyes: Negative for pain and visual disturbance.  Respiratory: Negative for cough and shortness of breath.   Cardiovascular: Negative for chest pain and palpitations.  Gastrointestinal: Negative for abdominal pain and vomiting.  Genitourinary: Negative for dysuria and hematuria.  Musculoskeletal: Positive for back pain and gait problem. Negative for arthralgias.  Skin: Negative for color change and rash.  Neurological: Negative for seizures and syncope.   All other systems reviewed and are negative.    Physical Exam Triage Vital Signs ED Triage Vitals  Enc Vitals Group     BP 10/31/18 1900 (!) 146/71     Pulse Rate 10/31/18 1900 84     Resp 10/31/18 1900 17     Temp 10/31/18 1900 98.4 F (36.9 C)     Temp Source 10/31/18 1900 Oral     SpO2 10/31/18 1900 99 %     Weight --      Height --      Head Circumference --      Peak Flow --      Pain Score 10/31/18 1901 10     Pain Loc --      Pain Edu? --      Excl. in [place]? --  No data found.  Updated Vital Signs BP (!) 146/71 (BP Location: Right Arm)   Pulse 84   Temp 98.4 F (36.9 C) (Oral)   Resp 17   SpO2 99%      Physical Exam Constitutional:      General: He is in acute distress.     Appearance: He is well-developed and normal weight.     Comments: Appears uncomfortable.  Moves very slowly.  Uses a cane.  HENT:     Head: Normocephalic and atraumatic.  Eyes:     Conjunctiva/sclera: Conjunctivae normal.     Pupils: Pupils are equal, round, and reactive to light.  Neck:     Musculoskeletal: Normal range of motion.  Cardiovascular:     Rate and Rhythm: Normal rate.  Pulmonary:     Effort: Pulmonary effort is normal. No respiratory distress.  Abdominal:     General: There is no distension.     Palpations: Abdomen is soft.  Musculoskeletal: Normal range of motion.     Comments: Back is tender in the left SI region and at the left S1 area centrally.  No palpable muscle spasm.  Very limited range of motion secondary to pain.  Patient stands with difficulty.  Straight leg raise on the right is positive at 15 degrees.  His leg is tremulous when he tries to raise it.  Sensation appears intact  Skin:    General: Skin is warm and dry.  Neurological:     Mental Status: He is alert.      UC Treatments / Results  Labs (all labs ordered are listed, but only abnormal results are displayed) Labs Reviewed - No data to display  EKG   Radiology No results found.   Procedures Procedures (including critical care time)  Medications Ordered in UC Medications  ketorolac (TORADOL) injection 60 mg (60 mg Intramuscular Given 10/31/18 2004)  ketorolac (TORADOL) 60 MG/2ML injection (has no administration in time range)    Initial Impression / Assessment and Plan / UC Course  I have reviewed the triage vital signs and the nursing notes.  Pertinent labs & imaging results that were available during my care of the patient were reviewed by me and considered in my medical decision making (see chart for details).     Known degenerative disc disease with a flare of sciatica. Final Clinical Impressions(s) / UC Diagnoses   Final diagnoses:  None     Discharge Instructions     Starting tomorrow take prednisone 2 times a day for 5 days.  After this take 1 a day until gone When you are finished with prednisone you would take a non-steroid anti-inflammatory like ibuprofen or Aleve Take the muscle relaxer as directed.  Take at bedtime. Take Percocet as needed for severe pain.  Do not drive while taking Percocet.  Make sure you take a stool softener, Metamucil, or bran cereal while on the Percocet.  It can cause constipation Activity as tolerated.  Resisted the temptation for bedrest Call the [facility] hospital tomorrow to arrange follow-up Take the gabapentin medication 3 times a day until your pain improves   ED Prescriptions    Medication Sig Dispense Auth. Provider   oxyCODONE-acetaminophen (PERCOCET/ROXICET) 5-325 MG tablet Take 2 tablets by mouth every 6 (six) hours as needed for severe pain. 25 tablet Raylene Everts, MD   tiZANidine (ZANAFLEX) 4 MG tablet Take 1-2 tablets (4-8 mg total) by mouth every 6 (six) hours as needed for muscle spasms. 21 tablet Meda Coffee,  Jennette Banker, MD   predniSONE (DELTASONE) 20 MG tablet Take 2 times a day for 5 days then one a day until gone 12 tablet Meda Coffee Jennette Banker, MD     Controlled Substance Prescriptions [place] Controlled  Substance Registry consulted? Yes, I have consulted the [place] Controlled Substances Registry for this patient, and feel the risk/benefit ratio today is favorable for proceeding with this prescription for a controlled substance.   Raylene Everts, MD 10/31/18 2034

## 2018-10-31 NOTE — ED Provider Notes (Signed)
[place] EMERGENCY DEPARTMENT Provider Note   CSN: [id number] Arrival date & time: 10/30/18  2328     History   Chief Complaint Chief Complaint  Patient presents with  . Sciatica    HPI [name] is a 58 y.o. male.     The history is provided by the patient and medical records.     58 year old male with history of coronary artery disease, depression, diabetes, hyperlipidemia, hypertension, sciatica, prior stroke, presenting to the ED with low back pain.  Patient states symptoms began 2 days ago and has been progressively worsening throughout the weekend.  States it feels like his prior episodes of sciatica.  He denies any injury, trauma, or falls, states he woke up Friday morning with the symptoms.  No new mattress or other furniture at home.  Pain begins in right lower back, radiates down into right buttock, right posterior thigh, and all the way to the foot.  He denies any numbness or weakness.  No bowel or bladder incontinence.  No urinary symptoms.  He has been using a cane temporarily to help offset his weight which is helping him walk a little easier.  He had some muscle relaxers leftover at home and has taken a few of them with some mild relief but still not able to get comfortable enough to sleep.  Past Medical History:  Diagnosis Date  . Anxiety   . Chronic pancreatitis ([place])   . Coronary artery disease   . Depression   . Diabetes mellitus without complication ([place])   . Hyperlipidemia   . Hypertension   . Myocardial infarction ([place])   . Neuromuscular disorder ([place])   . Sciatica   . Seizures ([place])   . Stroke ([place])   . Substance abuse [facility])     Patient Active Problem List   Diagnosis Date Noted  . Schizophrenia spectrum disorder with psychotic disorder type not yet determined ([place]) 09/24/2015  . Intermittent explosive disorder 09/20/2015  . Severe recurrent major depressive disorder with psychotic features ([place]) 09/20/2015  .  Suicidal ideation 09/20/2015  . Homicidal ideation 09/20/2015  . Chest pain at rest 03/19/2013  . Acute non Q wave MI (myocardial infarction), initial episode of care ([place]) 03/06/2013  . Chronic sciatica of left side 09/10/2012  . Personality disorder ([place]) 11/10/2011  . Alcohol use disorder, mild, abuse 10/01/2011  . NSTEMI (non-ST elevated myocardial infarction) ([place]) 10/01/2011  . Erectile dysfunction 08/01/2010  . Urge incontinence of urine 08/01/2010  . Chronic alcoholic pancreatitis ([place]) 07/24/2010  . Tobacco use disorder 07/24/2010  . Hypertension 07/24/2010  . Hyperlipidemia 07/24/2010  . CVA (cerebral vascular accident) ([place]) 07/24/2010  . Chronic back pain 07/24/2010    Past Surgical History:  Procedure Laterality Date  . BACK SURGERY    . CORONARY STENT PLACEMENT    . LEFT HEART CATHETERIZATION WITH CORONARY ANGIOGRAM N/A 10/01/2011   Procedure: LEFT HEART CATHETERIZATION WITH CORONARY ANGIOGRAM;  Surgeon: Clent Demark, MD;  Location: [facility] CATH LAB;  Service: Cardiovascular;  Laterality: N/A;  . LEFT HEART CATHETERIZATION WITH CORONARY ANGIOGRAM N/A 03/06/2013   Procedure: LEFT HEART CATHETERIZATION WITH CORONARY ANGIOGRAM;  Surgeon: Clent Demark, MD;  Location: [facility] CATH LAB;  Service: Cardiovascular;  Laterality: N/A;  . PERCUTANEOUS CORONARY STENT INTERVENTION (PCI-S) N/A 10/02/2011   Procedure: PERCUTANEOUS CORONARY STENT INTERVENTION (PCI-S);  Surgeon: Clent Demark, MD;  Location[facility] CATH LAB;  Service: Cardiovascular;  Laterality: N/A;        Home Medications  Prior to Admission medications   Medication Sig Start Date End Date Taking? Authorizing Provider  aspirin EC 81 MG EC tablet Take 1 tablet (81 mg total) by mouth daily. 03/08/13   Charolette Forward, MD  atorvastatin (LIPITOR) 80 MG tablet Take 80 mg by mouth daily.    [provider]  clopidogrel (PLAVIX) 75 MG tablet Take 75 mg by mouth daily.    [provider]  diclofenac  (VOLTAREN) 75 MG EC tablet Take 75 mg by mouth daily.     [provider]  gabapentin (NEURONTIN) 300 MG capsule Take 300 mg by mouth daily.     [provider]  HYDROcodone-Acetaminophen 5-300 MG TABS Take 1 tablet by mouth See admin instructions. Take 1 tablet by mouth every four to six hours as needed for pain 08/06/17   [provider]  mirtazapine (REMERON) 15 MG tablet Take 1 tablet (15 mg total) by mouth at bedtime. 09/26/15   Pucilowska, Jolanta B, MD  OXcarbazepine (TRILEPTAL) 300 MG tablet Take 1 tablet (300 mg total) by mouth 2 (two) times daily. 09/26/15   Pucilowska, Jolanta B, MD  risperiDONE (RISPERDAL) 3 MG tablet Take 1 tablet (3 mg total) by mouth 2 (two) times daily. 09/26/15   Pucilowska, Herma Ard B, MD  ticagrelor (BRILINTA) 90 MG TABS tablet Take 1 tablet by mouth 2 (two) times daily.    [provider]  zolpidem (AMBIEN) 5 MG tablet Take 1 tablet (5 mg total) by mouth at bedtime as needed for sleep. 09/26/15   Clovis Fredrickson, MD    Family History No family history on file.  Social History Social History   Tobacco Use  . Smoking status: Current Every Day Smoker    Packs/day: 1.00    Years: 40.00    Pack years: 40.00    Types: Cigarettes  . Smokeless tobacco: Never Used  Substance Use Topics  . Alcohol use: Yes    Comment: 1-2/week  . Drug use: Never     Allergies   Morphine and related   Review of Systems Review of Systems  Musculoskeletal: Positive for back pain.  All other systems reviewed and are negative.    Physical Exam Updated Vital Signs BP (!) 149/78 (BP Location: Left Arm)   Pulse 88   Temp 99.9 F (37.7 C) (Oral)   Resp 16   SpO2 99%   Physical Exam Vitals signs and nursing note reviewed.  Constitutional:      Appearance: He is well-developed.  HENT:     Head: Normocephalic and atraumatic.  Eyes:     Conjunctiva/sclera: Conjunctivae normal.     Pupils: Pupils are equal, round, and reactive  to light.  Neck:     Musculoskeletal: Normal range of motion.  Cardiovascular:     Rate and Rhythm: Normal rate and regular rhythm.     Heart sounds: Normal heart sounds.  Pulmonary:     Effort: Pulmonary effort is normal.     Breath sounds: Normal breath sounds.  Abdominal:     General: Bowel sounds are normal.     Palpations: Abdomen is soft.  Musculoskeletal: Normal range of motion.     Comments: Low back overall normal in appearance, tenderness of the right SI joint, positive straight leg raise on right, negative on left, normal strength and senstaion of both legs, ambulatory with cane, discomfort noted but remains steady  Skin:    General: Skin is warm and dry.  Neurological:     Mental  Status: He is alert and oriented to person, place, and time.      ED Treatments / Results  Labs (all labs ordered are listed, but only abnormal results are displayed) Labs Reviewed - No data to display  EKG None  Radiology No results found.  Procedures Procedures (including critical care time)  Medications Ordered in ED Medications - No data to display   Initial Impression / Assessment and Plan / ED Course  I have reviewed the triage vital signs and the nursing notes.  Pertinent labs & imaging results that were available during my care of the patient were reviewed by me and considered in my medical decision making (see chart for details).  58 year old male here with recurrent sciatica.  He reports history of the same and states this feels similar.  He denies any new injury, trauma, or falls, rather just woke up on Friday morning with symptoms.  Pain localized to right lower back with radiation down the right leg.  No numbness, weakness, or tingling.  No bowel or bladder incontinence.  He has not had any fever, chills, or other infectious symptoms.  He has no focal neurologic deficits on exam concerning for cauda equina.  He has no history of cancer or IV drug use, do not suspect acute  spinal lesion or infectious process.  He will be treated symptomatically.  Encourage close follow-up with his PCP.  Return here for any new or acute changes.  Final Clinical Impressions(s) / ED Diagnoses   Final diagnoses:  Sciatica of right side    ED Discharge Orders         Ordered    traMADol (ULTRAM) 50 MG tablet  Every 6 hours PRN     10/31/18 0201    predniSONE (DELTASONE) 20 MG tablet     10/31/18 0201           Larene Pickett, PA-C 10/31/18 0231    Fatima Blank, MD 10/31/18 [phone]

## 2018-11-16 ENCOUNTER — Other Ambulatory Visit: Payer: Self-pay

## 2018-11-16 ENCOUNTER — Ambulatory Visit (HOSPITAL_COMMUNITY)
Admission: EM | Admit: 2018-11-16 | Discharge: 2018-11-16 | Disposition: A | Discharge status: Home / Self Care | Payer: Medicare PPO | Attending: Family Medicine | Admitting: Family Medicine

## 2018-11-16 ENCOUNTER — Encounter (HOSPITAL_COMMUNITY): Payer: Self-pay

## 2018-11-16 DIAGNOSIS — M5431 Sciatica, right side: Secondary | ICD-10-CM | POA: Diagnosis not present

## 2018-11-16 MED ORDER — OXYCODONE-ACETAMINOPHEN 5-325 MG PO TABS: 1.0000 | ORAL_TABLET | Freq: Three times a day (TID) | ORAL | 0 refills | Status: AC | PRN

## 2018-11-16 NOTE — Discharge Instructions (Signed)
We are unable to manage narcotics long term, this will need to be managed by your primary care provider.  I have sent a refill today, to get you through until next week, then you will need to reach back out to your primary care provider for management of your pain.

## 2018-11-16 NOTE — ED Provider Notes (Signed)
[place]    CSN: [id number] Arrival date & time: 11/16/18  1619      History   Chief Complaint Chief Complaint  Patient presents with  . Back Pain    HPI [name] is a 58 y.o. male.   [name] presents with complaints of right sided back pain which radiates to buttocks and down posterior right leg. He has been dealing with this for some time now, worse over the past few weeks. Was seen in the er and in uc over the past two weeks. Percocet has helped with his pain. His PCP was out of office this week so was unable to get a refill. Has an MRI scheduled for 9/14. History of back surgery, he states he has been told he needs additional surgery. No new injury. Pain is constant, certain movements worsen it. Tingling sensation. No saddle symptoms. No incontinence. No history  Of iv drug use. No fevers. He is ambulatory with a cane. Has been taking gabapentin and robaxin, which have not helped. He is on a blood thinner. History  Of anxiety, pancreatitis, depression, DM, MI, seizures, stroke.    ROS per HPI, negative if not otherwise mentioned.      Past Medical History:  Diagnosis Date  . Anxiety   . Chronic pancreatitis ([place])   . Coronary artery disease   . Depression   . Diabetes mellitus without complication ([place])   . Hyperlipidemia   . Hypertension   . Myocardial infarction ([place])   . Neuromuscular disorder ([place])   . Sciatica   . Seizures ([place])   . Stroke ([place])   . Substance abuse [facility])     Patient Active Problem List   Diagnosis Date Noted  . Schizophrenia spectrum disorder with psychotic disorder type not yet determined ([place]) 09/24/2015  . Intermittent explosive disorder 09/20/2015  . Severe recurrent major depressive disorder with psychotic features ([place]) 09/20/2015  . Suicidal ideation 09/20/2015  . Homicidal ideation 09/20/2015  . Chest pain at rest 03/19/2013  . Acute non Q wave MI (myocardial infarction), initial episode of  care ([place]) 03/06/2013  . Chronic sciatica of left side 09/10/2012  . Personality disorder ([place]) 11/10/2011  . Alcohol use disorder, mild, abuse 10/01/2011  . NSTEMI (non-ST elevated myocardial infarction) ([place]) 10/01/2011  . Erectile dysfunction 08/01/2010  . Urge incontinence of urine 08/01/2010  . Chronic alcoholic pancreatitis ([place]) 07/24/2010  . Tobacco use disorder 07/24/2010  . Hypertension 07/24/2010  . Hyperlipidemia 07/24/2010  . CVA (cerebral vascular accident) ([place]) 07/24/2010  . Chronic back pain 07/24/2010    Past Surgical History:  Procedure Laterality Date  . BACK SURGERY    . CORONARY STENT PLACEMENT    . LEFT HEART CATHETERIZATION WITH CORONARY ANGIOGRAM N/A 10/01/2011   Procedure: LEFT HEART CATHETERIZATION WITH CORONARY ANGIOGRAM;  Surgeon: Clent Demark, MD;  Location: [facility] CATH LAB;  Service: Cardiovascular;  Laterality: N/A;  . LEFT HEART CATHETERIZATION WITH CORONARY ANGIOGRAM N/A 03/06/2013   Procedure: LEFT HEART CATHETERIZATION WITH CORONARY ANGIOGRAM;  Surgeon: Clent Demark, MD;  Location: [facility] CATH LAB;  Service: Cardiovascular;  Laterality: N/A;  . PERCUTANEOUS CORONARY STENT INTERVENTION (PCI-S) N/A 10/02/2011   Procedure: PERCUTANEOUS CORONARY STENT INTERVENTION (PCI-S);  Surgeon: Clent Demark, MD;  Location: [facility] CATH LAB;  Service: Cardiovascular;  Laterality: N/A;       Home Medications    Prior to Admission medications   Medication Sig Start Date End Date Taking? Authorizing Provider  aspirin EC  81 MG EC tablet Take 1 tablet (81 mg total) by mouth daily. 03/08/13   Charolette Forward, MD  atorvastatin (LIPITOR) 80 MG tablet Take 80 mg by mouth daily.    [provider]  clopidogrel (PLAVIX) 75 MG tablet Take 75 mg by mouth daily.    [provider]  gabapentin (NEURONTIN) 300 MG capsule Take 300 mg by mouth daily.     [provider]  mirtazapine (REMERON) 15 MG tablet Take 1 tablet (15 mg total) by mouth at bedtime.  09/26/15   Pucilowska, Jolanta B, MD  OXcarbazepine (TRILEPTAL) 300 MG tablet Take 1 tablet (300 mg total) by mouth 2 (two) times daily. 09/26/15   Pucilowska, Herma Ard B, MD  oxyCODONE-acetaminophen (PERCOCET/ROXICET) 5-325 MG tablet Take 1 tablet by mouth every 8 (eight) hours as needed for up to 5 days for severe pain. 11/16/18 11/21/18  Zigmund Gottron, NP  predniSONE (DELTASONE) 20 MG tablet Take 2 times a day for 5 days then one a day until gone 10/31/18   Raylene Everts, MD  risperiDONE (RISPERDAL) 3 MG tablet Take 1 tablet (3 mg total) by mouth 2 (two) times daily. 09/26/15   Pucilowska, Herma Ard B, MD  ticagrelor (BRILINTA) 90 MG TABS tablet Take 1 tablet by mouth 2 (two) times daily.    [provider]  tiZANidine (ZANAFLEX) 4 MG tablet Take 1-2 tablets (4-8 mg total) by mouth every 6 (six) hours as needed for muscle spasms. 10/31/18   Raylene Everts, MD  zolpidem (AMBIEN) 5 MG tablet Take 1 tablet (5 mg total) by mouth at bedtime as needed for sleep. 09/26/15   Pucilowska, Wardell Honour, MD    Family History Family History  Family history unknown: Yes    Social History Social History   Tobacco Use  . Smoking status: Current Every Day Smoker    Packs/day: 1.00    Years: 40.00    Pack years: 40.00    Types: Cigarettes  . Smokeless tobacco: Never Used  Substance Use Topics  . Alcohol use: Yes    Comment: 1-2/week  . Drug use: Never     Allergies   Morphine and related   Review of Systems Review of Systems   Physical Exam Triage Vital Signs ED Triage Vitals  Enc Vitals Group     BP 11/16/18 1638 (!) 156/67     Pulse Rate 11/16/18 1638 (!) 101     Resp 11/16/18 1638 18     Temp 11/16/18 1638 98.4 F (36.9 C)     Temp Source 11/16/18 1638 Oral     SpO2 11/16/18 1638 100 %     Weight 11/16/18 1639 150 lb (68 kg)     Height --      Head Circumference --      Peak Flow --      Pain Score 11/16/18 1639 9     Pain Loc --      Pain Edu? --      Excl. in [place]?  --    No data found.  Updated Vital Signs BP (!) 156/67 (BP Location: Right Arm)   Pulse (!) 101   Temp 98.4 F (36.9 C) (Oral)   Resp 18   Wt 150 lb (68 kg)   SpO2 100%   BMI 20.92 kg/m   Visual Acuity Right Eye Distance:   Left Eye Distance:   Bilateral Distance:    Right Eye Near:   Left Eye Near:    Bilateral  Near:     Physical Exam Constitutional:      Appearance: He is well-developed.  Cardiovascular:     Comments: Noted mild tachycardia on arrival  Pulmonary:     Effort: Pulmonary effort is normal.  Musculoskeletal:     Lumbar back: He exhibits tenderness, bony tenderness and pain. He exhibits normal range of motion, no swelling, no edema, no deformity, no laceration, no spasm and normal pulse.       Back:     Comments: Pain with right leg hip flexion and right straight leg raise; strength equal bilaterally; gross sensation intact; ambulatory with cane without significant difficulty   Skin:    General: Skin is warm and dry.  Neurological:     Mental Status: He is alert and oriented to person, place, and time.      UC Treatments / Results  Labs (all labs ordered are listed, but only abnormal results are displayed) Labs Reviewed - No data to display  EKG   Radiology No results found.  Procedures Procedures (including critical care time)  Medications Ordered in UC Medications - No data to display  Initial Impression / Assessment and Plan / UC Course  I have reviewed the triage vital signs and the nursing notes.  Pertinent labs & imaging results that were available during my care of the patient were reviewed by me and considered in my medical decision making (see chart for details).     Prednisone has not helped with symptoms. Muscle relaxers are not helping. Gabapentin is not helping. He is on a blood thinner, therefore nsaids are not ideal. PMP reviewed. Will provided one more 5 day supply of percocet, patient provided with emphasis that UC  will not be able to continue to manage or provide this script however, and he will need to follow up with his PCP next week as well as carry through with MRI as scheduled. Return precautions provided. Patient verbalized understanding and agreeable to plan.  Ambulatory out of clinic without difficulty.    Final Clinical Impressions(s) / UC Diagnoses   Final diagnoses:  Sciatica of right side     Discharge Instructions     We are unable to manage narcotics long term, this will need to be managed by your primary care provider.  I have sent a refill today, to get you through until next week, then you will need to reach back out to your primary care provider for management of your pain.    ED Prescriptions    Medication Sig Dispense Auth. Provider   oxyCODONE-acetaminophen (PERCOCET/ROXICET) 5-325 MG tablet Take 1 tablet by mouth every 8 (eight) hours as needed for up to 5 days for severe pain. 15 tablet Zigmund Gottron, NP     Controlled Substance Prescriptions [place] Controlled Substance Registry consulted? Not Applicable   Zigmund Gottron, NP 11/16/18 1944

## 2018-11-16 NOTE — ED Triage Notes (Signed)
Pt states he's having sciatic  Pain x 3 weeks.

## 2019-04-24 ENCOUNTER — Emergency Department (HOSPITAL_COMMUNITY): Payer: Medicare PPO

## 2019-04-24 ENCOUNTER — Emergency Department (HOSPITAL_COMMUNITY)
Admission: EM | Admit: 2019-04-24 | Discharge: 2019-04-24 | Disposition: A | Discharge status: Home / Self Care | Payer: Medicare PPO | Attending: Emergency Medicine | Admitting: Emergency Medicine

## 2019-04-24 ENCOUNTER — Encounter (HOSPITAL_COMMUNITY): Payer: Self-pay

## 2019-04-24 DIAGNOSIS — R42 Dizziness and giddiness: Secondary | ICD-10-CM | POA: Diagnosis not present

## 2019-04-24 DIAGNOSIS — F10129 Alcohol abuse with intoxication, unspecified: Secondary | ICD-10-CM | POA: Diagnosis not present

## 2019-04-24 DIAGNOSIS — Z7984 Long term (current) use of oral hypoglycemic drugs: Secondary | ICD-10-CM | POA: Diagnosis not present

## 2019-04-24 DIAGNOSIS — E1165 Type 2 diabetes mellitus with hyperglycemia: Secondary | ICD-10-CM | POA: Diagnosis not present

## 2019-04-24 DIAGNOSIS — S199XXA Unspecified injury of neck, initial encounter: Secondary | ICD-10-CM | POA: Diagnosis not present

## 2019-04-24 DIAGNOSIS — I10 Essential (primary) hypertension: Secondary | ICD-10-CM | POA: Diagnosis not present

## 2019-04-24 DIAGNOSIS — S0990XA Unspecified injury of head, initial encounter: Secondary | ICD-10-CM | POA: Diagnosis not present

## 2019-04-24 DIAGNOSIS — F1010 Alcohol abuse, uncomplicated: Secondary | ICD-10-CM

## 2019-04-24 DIAGNOSIS — Z79899 Other long term (current) drug therapy: Secondary | ICD-10-CM | POA: Diagnosis not present

## 2019-04-24 DIAGNOSIS — Y903 Blood alcohol level of 60-79 mg/100 ml: Secondary | ICD-10-CM | POA: Diagnosis not present

## 2019-04-24 DIAGNOSIS — R0789 Other chest pain: Secondary | ICD-10-CM | POA: Diagnosis not present

## 2019-04-24 DIAGNOSIS — Z7982 Long term (current) use of aspirin: Secondary | ICD-10-CM | POA: Diagnosis not present

## 2019-04-24 DIAGNOSIS — E119 Type 2 diabetes mellitus without complications: Secondary | ICD-10-CM | POA: Insufficient documentation

## 2019-04-24 DIAGNOSIS — I119 Hypertensive heart disease without heart failure: Secondary | ICD-10-CM | POA: Diagnosis not present

## 2019-04-24 DIAGNOSIS — R079 Chest pain, unspecified: Secondary | ICD-10-CM | POA: Diagnosis not present

## 2019-04-24 DIAGNOSIS — I2510 Atherosclerotic heart disease of native coronary artery without angina pectoris: Secondary | ICD-10-CM | POA: Insufficient documentation

## 2019-04-24 DIAGNOSIS — F17210 Nicotine dependence, cigarettes, uncomplicated: Secondary | ICD-10-CM | POA: Insufficient documentation

## 2019-04-24 LAB — CBC
HCT: 41.5 % (ref 39.0–52.0)
Hemoglobin: 13.7 g/dL (ref 13.0–17.0)
MCH: 29.4 pg (ref 26.0–34.0)
MCHC: 33 g/dL (ref 30.0–36.0)
MCV: 89.1 fL (ref 80.0–100.0)
Platelets: 231 10*3/uL (ref 150–400)
RBC: 4.66 MIL/uL (ref 4.22–5.81)
RDW: 12.6 % (ref 11.5–15.5)
WBC: 9.9 10*3/uL (ref 4.0–10.5)
nRBC: 0 % (ref 0.0–0.2)

## 2019-04-24 LAB — CBG MONITORING, ED: Glucose-Capillary: 185 mg/dL — ABNORMAL HIGH (ref 70–99)

## 2019-04-24 LAB — TROPONIN I (HIGH SENSITIVITY): Troponin I (High Sensitivity): 9 ng/L (ref <18)

## 2019-04-24 LAB — ETHANOL: Alcohol, Ethyl (B): 63 mg/dL — ABNORMAL HIGH (ref <10)

## 2019-04-24 LAB — BASIC METABOLIC PANEL
Anion gap: 11 (ref 5–15)
BUN: 13 mg/dL (ref 6–20)
CO2: 23 mmol/L (ref 22–32)
Calcium: 9.4 mg/dL (ref 8.9–10.3)
Chloride: 102 mmol/L (ref 98–111)
Creatinine, Ser: 0.93 mg/dL (ref 0.61–1.24)
GFR calc Af Amer: >60 mL/min (ref >60)
GFR calc non Af Amer: >60 mL/min (ref >60)
Glucose, Bld: 227 mg/dL — ABNORMAL HIGH (ref 70–99)
Potassium: 3.5 mmol/L (ref 3.5–5.1)
Sodium: 136 mmol/L (ref 135–145)

## 2019-04-24 MED ORDER — ALUM & MAG HYDROXIDE-SIMETH 200-200-20 MG/5ML PO SUSP
30.0000 mL | Freq: Once | ORAL | Status: AC
  Administered 2019-04-24: 30 mL via ORAL
  Filled 2019-04-24: qty 30

## 2019-04-24 MED ORDER — PANTOPRAZOLE SODIUM 40 MG IV SOLR
40.0000 mg | Freq: Once | INTRAVENOUS | Status: AC
  Administered 2019-04-24: 40 mg via INTRAVENOUS
  Filled 2019-04-24: qty 40

## 2019-04-24 MED ORDER — PANTOPRAZOLE SODIUM 20 MG PO TBEC: 20.0000 mg | DELAYED_RELEASE_TABLET | Freq: Every day | ORAL | 0 refills | Status: DC

## 2019-04-24 MED ORDER — SODIUM CHLORIDE 0.9 % IV BOLUS
1000.0000 mL | Freq: Once | INTRAVENOUS | Status: AC
  Administered 2019-04-24: 21:00:00 1000 mL via INTRAVENOUS

## 2019-04-24 MED ORDER — LIDOCAINE VISCOUS HCL 2 % MT SOLN
15.0000 mL | Freq: Once | OROMUCOSAL | Status: AC
  Administered 2019-04-24: 23:00:00 15 mL via ORAL
  Filled 2019-04-24: qty 15

## 2019-04-24 NOTE — ED Notes (Signed)
Pt returned from Radiology.

## 2019-04-24 NOTE — ED Notes (Signed)
Pt ambulated freely w/o assistance

## 2019-04-24 NOTE — ED Notes (Signed)
Per EDP second Troponin does not need to be collected due to nature of CP

## 2019-04-24 NOTE — ED Notes (Signed)
Patient verbalizes understanding of discharge instructions. Opportunity for questioning and answers were provided. Armband removed by staff, pt discharged from ED ambulatory to home.  

## 2019-04-24 NOTE — ED Notes (Signed)
Pt transported to XRAY °

## 2019-04-24 NOTE — ED Provider Notes (Signed)
[facility] EMERGENCY DEPARTMENT Provider Note   CSN: [id number] Arrival date & time: 04/24/19  2019     History Chief Complaint  Patient presents with  . Dizziness    [name] is a 59 y.o. male history of CVA, substance abuse, seizures, hypertension, hyperlipidemia, diabetes, CAD, schizophrenia.  Patient presents today for multiple complaints.  Patient's primary concern today is a left upper chest pain ongoing for 2-3 weeks he describes it as a sharp nonradiating pain no clear aggravating or alleviating factors.  Patient's second concern today is ongoing dizziness x5-6 months.  He describes dizziness primarily with positional changes, feels like the room spins briefly before it resolves gradually as he stays still.  He feels that his dizziness is worsening over the past week.  He reports that today he was using his cane and walking to his bathroom when he had worsening of his dizziness causing him to fall towards his right side.  He was able to catch himself on his arm and leg before falling completely to the ground.  He denies any loss of consciousness, head injury, headache or pain from his fall.  Patient reports that he is not on blood thinner but cannot recall the name.  Of note patient reports he drinks a "half 1/5" of alcohol daily and has been drinking today.  Denies fever/chills, headache, vision changes, neck pain, shortness of breath, cough, abdominal pain, nausea/vomiting, diarrhea, numbness/weakness, tingling, pain of the extremities or any additional concerns. HPI     Past Medical History:  Diagnosis Date  . Anxiety   . Chronic pancreatitis ([place])   . Coronary artery disease   . Depression   . Diabetes mellitus without complication ([place])   . Hyperlipidemia   . Hypertension   . Myocardial infarction ([place])   . Neuromuscular disorder ([place])   . Sciatica   . Seizures ([place])   . Stroke ([place])   . Substance abuse [facility])     Patient Active  Problem List   Diagnosis Date Noted  . Schizophrenia spectrum disorder with psychotic disorder type not yet determined ([place]) 09/24/2015  . Intermittent explosive disorder 09/20/2015  . Severe recurrent major depressive disorder with psychotic features ([place]) 09/20/2015  . Suicidal ideation 09/20/2015  . Homicidal ideation 09/20/2015  . Chest pain at rest 03/19/2013  . Acute non Q wave MI (myocardial infarction), initial episode of care ([place]) 03/06/2013  . Chronic sciatica of left side 09/10/2012  . Personality disorder ([place]) 11/10/2011  . Alcohol use disorder, mild, abuse 10/01/2011  . NSTEMI (non-ST elevated myocardial infarction) ([place]) 10/01/2011  . Erectile dysfunction 08/01/2010  . Urge incontinence of urine 08/01/2010  . Chronic alcoholic pancreatitis ([place]) 07/24/2010  . Tobacco use disorder 07/24/2010  . Hypertension 07/24/2010  . Hyperlipidemia 07/24/2010  . CVA (cerebral vascular accident) ([place]) 07/24/2010  . Chronic back pain 07/24/2010    Past Surgical History:  Procedure Laterality Date  . BACK SURGERY    . CORONARY STENT PLACEMENT    . LEFT HEART CATHETERIZATION WITH CORONARY ANGIOGRAM N/A 10/01/2011   Procedure: LEFT HEART CATHETERIZATION WITH CORONARY ANGIOGRAM;  Surgeon: Clent Demark, MD;  Location: [facility] CATH LAB;  Service: Cardiovascular;  Laterality: N/A;  . LEFT HEART CATHETERIZATION WITH CORONARY ANGIOGRAM N/A 03/06/2013   Procedure: LEFT HEART CATHETERIZATION WITH CORONARY ANGIOGRAM;  Surgeon: Clent Demark, MD;  Location: [facility] CATH LAB;  Service: Cardiovascular;  Laterality: N/A;  . PERCUTANEOUS CORONARY STENT INTERVENTION (PCI-S) N/A 10/02/2011   Procedure: PERCUTANEOUS CORONARY  STENT INTERVENTION (PCI-S);  Surgeon: Clent Demark, MD;  Location: [facility] CATH LAB;  Service: Cardiovascular;  Laterality: N/A;       Family History  Family history unknown: Yes    Social History   Tobacco Use  . Smoking status: Current Every Day Smoker    Packs/day: 1.00     Years: 40.00    Pack years: 40.00    Types: Cigarettes  . Smokeless tobacco: Never Used  Substance Use Topics  . Alcohol use: Yes    Comment: 1-2/week  . Drug use: Never    Home Medications Prior to Admission medications   Medication Sig Start Date End Date Taking? Authorizing Provider  aspirin EC 81 MG EC tablet Take 1 tablet (81 mg total) by mouth daily. Patient taking differently: Take 81 mg by mouth 3 (three) times a week.  03/08/13  Yes Charolette Forward, MD  atorvastatin (LIPITOR) 80 MG tablet Take 80 mg by mouth daily.    [provider]  clopidogrel (PLAVIX) 75 MG tablet Take 75 mg by mouth daily.    [provider]  gabapentin (NEURONTIN) 300 MG capsule Take 300 mg by mouth daily.     [provider]  mirtazapine (REMERON) 15 MG tablet Take 1 tablet (15 mg total) by mouth at bedtime. 09/26/15   Pucilowska, Jolanta B, MD  OXcarbazepine (TRILEPTAL) 300 MG tablet Take 1 tablet (300 mg total) by mouth 2 (two) times daily. 09/26/15   Pucilowska, Jolanta B, MD  pantoprazole (PROTONIX) 20 MG tablet Take 1 tablet (20 mg total) by mouth daily. 04/24/19   Deliah Boston, PA-C  predniSONE (DELTASONE) 20 MG tablet Take 2 times a day for 5 days then one a day until gone 10/31/18   Raylene Everts, MD  risperiDONE (RISPERDAL) 3 MG tablet Take 1 tablet (3 mg total) by mouth 2 (two) times daily. 09/26/15   Pucilowska, Herma Ard B, MD  ticagrelor (BRILINTA) 90 MG TABS tablet Take 1 tablet by mouth 2 (two) times daily.    [provider]  tiZANidine (ZANAFLEX) 4 MG tablet Take 1-2 tablets (4-8 mg total) by mouth every 6 (six) hours as needed for muscle spasms. 10/31/18   Raylene Everts, MD  zolpidem (AMBIEN) 5 MG tablet Take 1 tablet (5 mg total) by mouth at bedtime as needed for sleep. 09/26/15   Pucilowska, Wardell Honour, MD    Allergies    Morphine and related  Review of Systems   Review of Systems Ten systems are reviewed and are negative for acute change  except as noted in the HPI  Physical Exam Updated Vital Signs BP (!) 153/83   Pulse 93   Temp 98.8 F (37.1 C) (Oral)   Resp 16   SpO2 98%   Physical Exam Constitutional:      General: He is not in acute distress.    Appearance: Normal appearance. He is well-developed. He is not ill-appearing or diaphoretic.  HENT:     Head: Normocephalic and atraumatic.     Right Ear: External ear normal.     Left Ear: External ear normal.     Nose: Nose normal.  Eyes:     General: Vision grossly intact. Gaze aligned appropriately.     Pupils: Pupils are equal, round, and reactive to light.  Neck:     Trachea: Trachea and phonation normal. No tracheal deviation.  Pulmonary:     Effort: Pulmonary effort is normal. No respiratory distress.  Abdominal:  General: There is no distension.     Palpations: Abdomen is soft.     Tenderness: There is no abdominal tenderness. There is no guarding or rebound.  Musculoskeletal:        General: Normal range of motion.     Cervical back: Normal range of motion.  Skin:    General: Skin is warm and dry.  Neurological:     Mental Status: He is alert.     GCS: GCS eye subscore is 4. GCS verbal subscore is 5. GCS motor subscore is 6.     Comments: Speech is clear and goal oriented, follows commands Major Cranial nerves without deficit, no facial droop Normal strength in upper and lower extremities bilaterally including dorsiflexion and plantar flexion, strong and equal grip strength Sensation normal to light and sharp touch Moves extremities without ataxia, coordination intact Normal finger to nose and rapid alternating movements No pronator drift  Psychiatric:        Behavior: Behavior normal.    ED Results / Procedures / Treatments   Labs (all labs ordered are listed, but only abnormal results are displayed) Labs Reviewed  BASIC METABOLIC PANEL - Abnormal; Notable for the following components:      Result Value   Glucose, Bld 227 (*)    All  other components within normal limits  ETHANOL - Abnormal; Notable for the following components:   Alcohol, Ethyl (B) 63 (*)    All other components within normal limits  CBG MONITORING, ED - Abnormal; Notable for the following components:   Glucose-Capillary 185 (*)    All other components within normal limits  CBC  URINALYSIS, ROUTINE W REFLEX MICROSCOPIC  TROPONIN I (HIGH SENSITIVITY)    EKG EKG Interpretation  Date/Time:  Monday April 24 2019 21:14:05 EST Ventricular Rate:  85 PR Interval:    QRS Duration: 97 QT Interval:  378 QTC Calculation: 450 R Axis:   64 Text Interpretation: Sinus rhythm Probable left atrial enlargement Borderline ST elevation, anterior leads Since last tracing of earlier today No old tracing to compare Confirmed by Daleen Bo [phone]) on 04/24/2019 10:27:13 PM   Radiology DG Chest 2 View  Result Date: 04/24/2019 CLINICAL DATA:  Fall and chest pain EXAM: CHEST - 2 VIEW COMPARISON:  June 15, 2014 FINDINGS: The heart size and mediastinal contours are within normal limits. Both lungs are clear. The visualized skeletal structures are unremarkable. IMPRESSION: No active cardiopulmonary disease. Electronically Signed   By: Prudencio Pair M.D.   On: 04/24/2019 21:21   CT Head Wo Contrast  Result Date: 04/24/2019 CLINICAL DATA:  Fall, on anticoagulation. EXAM: CT HEAD WITHOUT CONTRAST TECHNIQUE: Contiguous axial images were obtained from the base of the skull through the vertex without intravenous contrast. COMPARISON:  07/09/2010 FINDINGS: Brain: Old bilateral basal ganglia lacunar infarcts, stable. There is atrophy and chronic small vessel disease changes. No acute intracranial abnormality. Specifically, no hemorrhage, hydrocephalus, mass lesion, acute infarction, or significant intracranial injury. Vascular: No hyperdense vessel or unexpected calcification. Skull: No acute calvarial abnormality. Sinuses/Orbits: Visualized paranasal sinuses and mastoids clear.  Orbital soft tissues unremarkable. Other: None IMPRESSION: Old bilateral basal ganglia lacunar infarcts. Atrophy, chronic microvascular disease. No acute intracranial abnormality. Electronically Signed   By: Rolm Baptise M.D.   On: 04/24/2019 21:11   CT Cervical Spine Wo Contrast  Result Date: 04/24/2019 CLINICAL DATA:  Fall EXAM: CT CERVICAL SPINE WITHOUT CONTRAST TECHNIQUE: Multidetector CT imaging of the cervical spine was performed without intravenous contrast. Multiplanar CT image  reconstructions were also generated. COMPARISON:  None. FINDINGS: Alignment: No subluxation. Skull base and vertebrae: No acute fracture. No primary bone lesion or focal pathologic process. Soft tissues and spinal canal: No prevertebral fluid or swelling. No visible canal hematoma. Disc levels: Early degenerative disc disease at C5-6 and C6-7 with disc space narrowing and spurring. Upper chest: Emphysema.  No acute findings Other: None IMPRESSION: No acute bony abnormality. Electronically Signed   By: Rolm Baptise M.D.   On: 04/24/2019 21:12    Procedures Procedures (including critical care time)  Medications Ordered in ED Medications  sodium chloride 0.9 % bolus 1,000 mL (0 mLs Intravenous Stopped 04/24/19 2231)  pantoprazole (PROTONIX) injection 40 mg (40 mg Intravenous Given 04/24/19 2242)  alum & mag hydroxide-simeth (MAALOX/MYLANTA) 200-200-20 MG/5ML suspension 30 mL (30 mLs Oral Given 04/24/19 2242)    And  lidocaine (XYLOCAINE) 2 % viscous mouth solution 15 mL (15 mLs Oral Given 04/24/19 2242)    ED Course  I have reviewed the triage vital signs and the nursing notes.  Pertinent labs & imaging results that were available during my care of the patient were reviewed by me and considered in my medical decision making (see chart for details).    MDM Rules/Calculators/A&P                     59 year old male on Plavix presents today for multiple concerns.  Primarily for left upper chest pain ongoing for several  weeks but worsening today.  Additionally ongoing dizziness for 5-6 months worsening over the past week and causing 1 fall today.  He denies any injury from his fall.  He reports daily alcohol use and has been drinking today but does not appear intoxicated on initial evaluation.  Vital signs are stable he received pain medication by EMS and reports improvement of his pain.  He has no neuro deficits on exam is well-appearing and in no acute distress.  Heart regular rate and rhythm, lungs clear, abdomen soft nontender without peritoneal signs, neurovascular intact to all 4 extremities without evidence of DVT.  No sign of significant injury of the head, neck, back, chest, abdomen, pelvis or extremities. - High-sensitivity troponin within normal limit CBC within normal limits BMP glucose 227 otherwise within normal limits Ethanol 63 CBG 183 Chest x-ray:    IMPRESSION:  No active cardiopulmonary disease.   EKG: Sinus rhythm Probable left atrial enlargement Borderline ST elevation, anterior leads Since last tracing of earlier today No old tracing to compare Confirmed by Daleen Bo [phone]) on 04/24/2019 10:27:13 PM  CT Head:  IMPRESSION:  Old bilateral basal ganglia lacunar infarcts.    Atrophy, chronic microvascular disease.    No acute intracranial abnormality.   CT Cervical Spine:    IMPRESSION:  No acute bony abnormality.  - As patient's chest pain has been occurring for several weeks and has a troponin within normal limits there is no indication for delta troponin at this time.  Based on EKG, chest x-ray, troponin, physical examination and history suspect acid reflux as etiology of his symptoms today, likely worsened with chronic alcohol use.  Patient given GI cocktail and Protonix, will be discharged with Protonix prescription and encouraged PCP follow-up.  Doubt ACS, PE, dissection or other acute cardiopulmonary etiology of his symptoms today.  He has been monitored in the ED without  event, ambulated with nursing staff without difficulty or hypoxia on room air.    As the patient's dizziness ongoing for 5-6 months suspect  his chronic alcohol use may be contributing.  He has no neurologic deficits on examination today and dizziness was not reproducible.  May also have an orthostatic component as well.  Patient ethanol is mildly elevated here in the ER, he does not appear intoxicated.  He does not appear in danger to himself or others.  No evidence for CVA, dissection or other emergent processes at this time requiring further imaging or work-up in the ER at this time.  Orthostatics were obtained and were negative, patient had no recurrence of dizziness in the ED.  He was ambulated in the ED without assistance or difficulty. - Patient was reassessed after receiving GI cocktail and Protonix reports that he is feeling improved has no complaints at this time, pain-free.  He states understanding of care plan and is agreeable to discharge and PCP follow-up.  He will be given prescription for Protonix as well as a resource guide to help with his alcohol abuse.  At this time there does not appear to be any evidence of an acute emergency medical condition and the patient appears stable for discharge with appropriate outpatient follow up. Diagnosis was discussed with patient who verbalizes understanding of care plan and is agreeable to discharge. I have discussed return precautions with patient who verbalizes understanding of return precautions. Patient encouraged to follow-up with their PCP. All questions answered.  Patient's case discussed with Dr. Eulis Foster who agrees with plan to discharge with outpatient follow-up.   Note: Portions of this report may have been transcribed using voice recognition software. Every effort was made to ensure accuracy; however, inadvertent computerized transcription errors may still be present. Final Clinical Impression(s) / ED Diagnoses Final diagnoses:  Atypical  chest pain  Dizziness  Alcohol abuse    Rx / DC Orders ED Discharge Orders         Ordered    pantoprazole (PROTONIX) 20 MG tablet  Daily     04/24/19 2259           Gari Crown 04/24/19 2259    Daleen Bo, MD 04/24/19 2322

## 2019-04-24 NOTE — ED Triage Notes (Signed)
Pt BIB [facility] with Dizziness x few months but worsening; had fall today with no LOC and no obvious injuries.  Pt does take blood thinners; had MI in 2012.   Given 100 mcg of Fentanyl IV en route for CP under Left clavicle. Non radiating, more sharp.   Slightly Hypertensive (159/91) 276 CBG Other VSS w. EMS

## 2019-04-24 NOTE — ED Notes (Signed)
Pt ambulated well with no drop in oxygenation and maintained saturation at 96% and up. No increase in SOB and no dizziness.

## 2019-04-24 NOTE — Discharge Instructions (Addendum)
You have been diagnosed today with atypical chest pain, dizziness, alcohol abuse.  At this time there does not appear to be the presence of an emergent medical condition, however there is always the potential for conditions to change. Please read and follow the below instructions.  Please return to the Emergency Department immediately for any new or worsening symptoms. Please be sure to follow up with your Primary Care Provider within one week regarding your visit today; please call their office to schedule an appointment even if you are feeling better for a follow-up visit. You may take the medication Protonix as prescribed to help with your symptoms.  Please drink plenty water and get plenty of rest.  It is likely that your alcohol use is contributing to your pain as well as your dizziness.  Please attempt to cut back on your alcohol use.  Please use the resource guidance above to find help with your alcohol use.  Call [facility] today to schedule an appointment to help with your alcohol use. Patient to continue taking all of your home medications as prescribed by your primary care provider.  Get help right away if: Your chest pain is worse. You have a cough that gets worse, or you cough up blood. You have very bad (severe) pain in your belly (abdomen). You pass out (faint). You have either of these for no clear reason: Sudden chest discomfort. Sudden discomfort in your arms, back, neck, or jaw. You have shortness of breath at any time. You suddenly start to sweat, or your skin gets clammy. You feel sick to your stomach (nauseous). You throw up (vomit). You suddenly feel lightheaded or dizzy. You feel very weak or tired. Your heart starts to beat fast, or it feels like it is skipping beats. Get help right away if: You throw up (vomit) or have watery poop (diarrhea), and you cannot eat or drink anything. You have trouble: Talking. Walking. Swallowing. Using your arms, hands, or legs. You  feel generally weak. You are not thinking clearly, or you have trouble forming sentences. A friend or family member may notice this. You have: Chest pain. Pain in your belly (abdomen). Shortness of breath. Sweating. Your vision changes. You are bleeding. You have a very bad headache. You have neck pain or a stiff neck. You have a fever. You have any new/concerning or worsening of symptoms  Please read the additional information packets attached to your discharge summary.  Do not take your medicine if  develop an itchy rash, swelling in your mouth or lips, or difficulty breathing; call 911 and seek immediate emergency medical attention if this occurs.  Note: Portions of this text may have been transcribed using voice recognition software. Every effort was made to ensure accuracy; however, inadvertent computerized transcription errors may still be present.

## 2019-05-15 ENCOUNTER — Emergency Department (HOSPITAL_COMMUNITY): Payer: Medicare PPO

## 2019-05-15 ENCOUNTER — Encounter (HOSPITAL_COMMUNITY): Payer: Self-pay | Admitting: Emergency Medicine

## 2019-05-15 ENCOUNTER — Other Ambulatory Visit: Payer: Self-pay

## 2019-05-15 ENCOUNTER — Emergency Department (HOSPITAL_COMMUNITY)
Admission: EM | Admit: 2019-05-15 | Discharge: 2019-05-15 | Disposition: A | Discharge status: Home / Self Care | Payer: Medicare PPO | Attending: Emergency Medicine | Admitting: Emergency Medicine

## 2019-05-15 DIAGNOSIS — Z7982 Long term (current) use of aspirin: Secondary | ICD-10-CM | POA: Diagnosis not present

## 2019-05-15 DIAGNOSIS — E119 Type 2 diabetes mellitus without complications: Secondary | ICD-10-CM | POA: Insufficient documentation

## 2019-05-15 DIAGNOSIS — I10 Essential (primary) hypertension: Secondary | ICD-10-CM | POA: Insufficient documentation

## 2019-05-15 DIAGNOSIS — K1120 Sialoadenitis, unspecified: Secondary | ICD-10-CM | POA: Insufficient documentation

## 2019-05-15 DIAGNOSIS — R609 Edema, unspecified: Secondary | ICD-10-CM | POA: Diagnosis not present

## 2019-05-15 DIAGNOSIS — R4702 Dysphasia: Secondary | ICD-10-CM | POA: Diagnosis not present

## 2019-05-15 DIAGNOSIS — I259 Chronic ischemic heart disease, unspecified: Secondary | ICD-10-CM | POA: Diagnosis not present

## 2019-05-15 DIAGNOSIS — Z7902 Long term (current) use of antithrombotics/antiplatelets: Secondary | ICD-10-CM | POA: Diagnosis not present

## 2019-05-15 DIAGNOSIS — R52 Pain, unspecified: Secondary | ICD-10-CM | POA: Diagnosis not present

## 2019-05-15 DIAGNOSIS — F17210 Nicotine dependence, cigarettes, uncomplicated: Secondary | ICD-10-CM | POA: Insufficient documentation

## 2019-05-15 DIAGNOSIS — Z79899 Other long term (current) drug therapy: Secondary | ICD-10-CM | POA: Diagnosis not present

## 2019-05-15 DIAGNOSIS — R590 Localized enlarged lymph nodes: Secondary | ICD-10-CM | POA: Diagnosis not present

## 2019-05-15 DIAGNOSIS — R070 Pain in throat: Secondary | ICD-10-CM | POA: Diagnosis not present

## 2019-05-15 LAB — BASIC METABOLIC PANEL
Anion gap: 8 (ref 5–15)
BUN: 15 mg/dL (ref 6–20)
CO2: 23 mmol/L (ref 22–32)
Calcium: 9.1 mg/dL (ref 8.9–10.3)
Chloride: 105 mmol/L (ref 98–111)
Creatinine, Ser: 0.81 mg/dL (ref 0.61–1.24)
GFR calc Af Amer: >60 mL/min (ref >60)
GFR calc non Af Amer: >60 mL/min (ref >60)
Glucose, Bld: 212 mg/dL — ABNORMAL HIGH (ref 70–99)
Potassium: 3.8 mmol/L (ref 3.5–5.1)
Sodium: 136 mmol/L (ref 135–145)

## 2019-05-15 LAB — CBC WITH DIFFERENTIAL/PLATELET
Abs Immature Granulocytes: 0.04 10*3/uL (ref 0.00–0.07)
Basophils Absolute: 0 10*3/uL (ref 0.0–0.1)
Basophils Relative: 0 %
Eosinophils Absolute: 0.3 10*3/uL (ref 0.0–0.5)
Eosinophils Relative: 2 %
HCT: 38.3 % — ABNORMAL LOW (ref 39.0–52.0)
Hemoglobin: 12.9 g/dL — ABNORMAL LOW (ref 13.0–17.0)
Immature Granulocytes: 0 %
Lymphocytes Relative: 23 %
Lymphs Abs: 2.6 10*3/uL (ref 0.7–4.0)
MCH: 29.2 pg (ref 26.0–34.0)
MCHC: 33.7 g/dL (ref 30.0–36.0)
MCV: 86.7 fL (ref 80.0–100.0)
Monocytes Absolute: 0.9 10*3/uL (ref 0.1–1.0)
Monocytes Relative: 8 %
Neutro Abs: 7.5 10*3/uL (ref 1.7–7.7)
Neutrophils Relative %: 67 %
Platelets: 207 10*3/uL (ref 150–400)
RBC: 4.42 MIL/uL (ref 4.22–5.81)
RDW: 12.6 % (ref 11.5–15.5)
WBC: 11.3 10*3/uL — ABNORMAL HIGH (ref 4.0–10.5)
nRBC: 0 % (ref 0.0–0.2)

## 2019-05-15 MED ORDER — SODIUM CHLORIDE (PF) 0.9 % IJ SOLN
INTRAMUSCULAR | Status: AC
  Filled 2019-05-15: qty 50

## 2019-05-15 MED ORDER — IOHEXOL 300 MG/ML  SOLN
75.0000 mL | Freq: Once | INTRAMUSCULAR | Status: AC | PRN
  Administered 2019-05-15: 07:00:00 75 mL via INTRAVENOUS

## 2019-05-15 MED ORDER — DEXAMETHASONE SODIUM PHOSPHATE 10 MG/ML IJ SOLN
10.0000 mg | Freq: Once | INTRAMUSCULAR | Status: AC
  Administered 2019-05-15: 10 mg via INTRAVENOUS
  Filled 2019-05-15: qty 1

## 2019-05-15 MED ORDER — AMOXICILLIN 500 MG PO CAPS
1000.0000 mg | ORAL_CAPSULE | Freq: Once | ORAL | Status: AC
  Administered 2019-05-15: 1000 mg via ORAL
  Filled 2019-05-15: qty 2

## 2019-05-15 MED ORDER — AMOXICILLIN 500 MG PO CAPS: 1000.0000 mg | ORAL_CAPSULE | Freq: Two times a day (BID) | ORAL | 0 refills | Status: DC

## 2019-05-15 NOTE — ED Provider Notes (Signed)
[facility] DEPT Provider Note   CSN: [id number] Arrival date & time: 05/15/19  0330     History Chief Complaint  Patient presents with  . Sore Throat    [name] is a 59 y.o. male.  Patient with history of HTN, CVA, HLD, seizures, MI/CAD, DM, substance abuse presents with pain and swelling in the left lateral neck that started 2 weeks ago, and in the right lateral neck several days ago. He does not know whether or not he has had a fever. These areas are tender to touch and pain is elicited when he swallows. No congestion, rhinorrhea, cough, dental issues.   The history is provided by the patient. No language interpreter was used.  Sore Throat Pertinent negatives include no chest pain, no abdominal pain, no headaches and no shortness of breath.       Past Medical History:  Diagnosis Date  . Anxiety   . Chronic pancreatitis ([place])   . Coronary artery disease   . Depression   . Diabetes mellitus without complication ([place])   . Hyperlipidemia   . Hypertension   . Myocardial infarction ([place])   . Neuromuscular disorder ([place])   . Sciatica   . Seizures ([place])   . Stroke ([place])   . Substance abuse [facility])     Patient Active Problem List   Diagnosis Date Noted  . Schizophrenia spectrum disorder with psychotic disorder type not yet determined ([place]) 09/24/2015  . Intermittent explosive disorder 09/20/2015  . Severe recurrent major depressive disorder with psychotic features ([place]) 09/20/2015  . Suicidal ideation 09/20/2015  . Homicidal ideation 09/20/2015  . Chest pain at rest 03/19/2013  . Acute non Q wave MI (myocardial infarction), initial episode of care ([place]) 03/06/2013  . Chronic sciatica of left side 09/10/2012  . Personality disorder ([place]) 11/10/2011  . Alcohol use disorder, mild, abuse 10/01/2011  . NSTEMI (non-ST elevated myocardial infarction) ([place]) 10/01/2011  . Erectile dysfunction 08/01/2010  . Urge incontinence of urine  08/01/2010  . Chronic alcoholic pancreatitis ([place]) 07/24/2010  . Tobacco use disorder 07/24/2010  . Hypertension 07/24/2010  . Hyperlipidemia 07/24/2010  . CVA (cerebral vascular accident) ([place]) 07/24/2010  . Chronic back pain 07/24/2010    Past Surgical History:  Procedure Laterality Date  . BACK SURGERY    . CORONARY STENT PLACEMENT    . LEFT HEART CATHETERIZATION WITH CORONARY ANGIOGRAM N/A 10/01/2011   Procedure: LEFT HEART CATHETERIZATION WITH CORONARY ANGIOGRAM;  Surgeon: Clent Demark, MD;  Location: [facility] CATH LAB;  Service: Cardiovascular;  Laterality: N/A;  . LEFT HEART CATHETERIZATION WITH CORONARY ANGIOGRAM N/A 03/06/2013   Procedure: LEFT HEART CATHETERIZATION WITH CORONARY ANGIOGRAM;  Surgeon: Clent Demark, MD;  Location: [facility] CATH LAB;  Service: Cardiovascular;  Laterality: N/A;  . PERCUTANEOUS CORONARY STENT INTERVENTION (PCI-S) N/A 10/02/2011   Procedure: PERCUTANEOUS CORONARY STENT INTERVENTION (PCI-S);  Surgeon: Clent Demark, MD;  Location: [facility] CATH LAB;  Service: Cardiovascular;  Laterality: N/A;       Family History  Family history unknown: Yes    Social History   Tobacco Use  . Smoking status: Current Every Day Smoker    Packs/day: 1.00    Years: 40.00    Pack years: 40.00    Types: Cigarettes  . Smokeless tobacco: Never Used  Substance Use Topics  . Alcohol use: Yes    Comment: 1-2/week  . Drug use: Never    Home Medications Prior to Admission medications   Medication Sig Start Date  End Date Taking? Authorizing Provider  aspirin EC 81 MG EC tablet Take 1 tablet (81 mg total) by mouth daily. Patient taking differently: Take 81 mg by mouth 3 (three) times a week.  03/08/13   Charolette Forward, MD  atorvastatin (LIPITOR) 80 MG tablet Take 80 mg by mouth daily.    [provider]  clopidogrel (PLAVIX) 75 MG tablet Take 75 mg by mouth daily.    [provider]  gabapentin (NEURONTIN) 300 MG capsule Take 300 mg by mouth daily.      [provider]  mirtazapine (REMERON) 15 MG tablet Take 1 tablet (15 mg total) by mouth at bedtime. 09/26/15   Pucilowska, Jolanta B, MD  OXcarbazepine (TRILEPTAL) 300 MG tablet Take 1 tablet (300 mg total) by mouth 2 (two) times daily. 09/26/15   Pucilowska, Jolanta B, MD  pantoprazole (PROTONIX) 20 MG tablet Take 1 tablet (20 mg total) by mouth daily. 04/24/19   Deliah Boston, PA-C  predniSONE (DELTASONE) 20 MG tablet Take 2 times a day for 5 days then one a day until gone 10/31/18   Raylene Everts, MD  risperiDONE (RISPERDAL) 3 MG tablet Take 1 tablet (3 mg total) by mouth 2 (two) times daily. 09/26/15   Pucilowska, Herma Ard B, MD  ticagrelor (BRILINTA) 90 MG TABS tablet Take 1 tablet by mouth 2 (two) times daily.    [provider]  tiZANidine (ZANAFLEX) 4 MG tablet Take 1-2 tablets (4-8 mg total) by mouth every 6 (six) hours as needed for muscle spasms. 10/31/18   Raylene Everts, MD  zolpidem (AMBIEN) 5 MG tablet Take 1 tablet (5 mg total) by mouth at bedtime as needed for sleep. 09/26/15   Pucilowska, Wardell Honour, MD    Allergies    Morphine and related  Review of Systems   Review of Systems  Constitutional: Negative for chills.       See HPI.  HENT: Positive for trouble swallowing. Negative for congestion and dental problem.   Respiratory: Negative for cough and shortness of breath.   Cardiovascular: Negative for chest pain.  Gastrointestinal: Negative for abdominal pain and nausea.  Musculoskeletal: Positive for neck pain.  Neurological: Negative for headaches.    Physical Exam Updated Vital Signs BP (!) 173/85   Pulse 89   Temp 99.5 F (37.5 C)   Resp 18   Ht 5\' 10"  (1.778 m)   Wt 71.2 kg   SpO2 100%   BMI 22.53 kg/m   Physical Exam Vitals and nursing note reviewed.  Constitutional:      Appearance: He is well-developed.  HENT:     Head: Normocephalic.     Right Ear: Tympanic membrane normal.     Left Ear: Tympanic membrane normal.   Cardiovascular:     Rate and Rhythm: Normal rate and regular rhythm.     Heart sounds: No murmur.  Pulmonary:     Effort: Pulmonary effort is normal.     Breath sounds: Normal breath sounds. No wheezing, rhonchi or rales.  Abdominal:     General: Bowel sounds are normal.     Palpations: Abdomen is soft.     Tenderness: There is no abdominal tenderness. There is no guarding or rebound.  Musculoskeletal:        General: Normal range of motion.     Cervical back: Normal range of motion and neck supple.  Lymphadenopathy:     Head:     Right side of head: Submandibular adenopathy present.  Left side of head: Submandibular adenopathy present.     Upper Body:     Right upper body: No supraclavicular adenopathy.     Left upper body: No supraclavicular adenopathy.     Comments: Bilateral tender, moderately swollen, submandibular lymph nodes  Skin:    General: Skin is warm and dry.  Neurological:     Mental Status: He is alert.     Cranial Nerves: No cranial nerve deficit.     ED Results / Procedures / Treatments   Labs (all labs ordered are listed, but only abnormal results are displayed) Labs Reviewed  CBC WITH DIFFERENTIAL/PLATELET  BASIC METABOLIC PANEL    EKG None  Radiology No results found.  Procedures Procedures (including critical care time)  Medications Ordered in ED Medications  dexamethasone (DECADRON) injection 10 mg (has no administration in time range)    ED Course  I have reviewed the triage vital signs and the nursing notes.  Pertinent labs & imaging results that were available during my care of the patient were reviewed by me and considered in my medical decision making (see chart for details).    MDM Rules/Calculators/A&P                      Patient with history of DM here with progressively worsening, painful submandibular lymph nodes. DDX: infection/abscess vs mass. CT w/CM ordered for full evaluation.   IV Decadron provided for  symptomatic relief.   CT shows findings c/w pharyngitis, but also submandibular sialoadenitis. Will start on Amoxil and recommend PCP follow up in 3 days to insure improvement.   Final Clinical Impression(s) / ED Diagnoses Final diagnoses:  None   1. Sialoadenitis   Rx / DC Orders ED Discharge Orders    None       Dennie Bible 05/15/19 [id number]    Palumbo, April, MD 05/15/19 [id number]

## 2019-05-15 NOTE — ED Triage Notes (Signed)
[facility] transported pt from home and reports the following:  Pt said he has bilateral swollen lymph nodes. Having difficulty talking and swallowing. Went to [place] last week but wasn't able to be seen. Pain woke him up tonight and that's the reason he called EMS. C/O of dizziness upon arrival to ED.

## 2019-05-15 NOTE — Discharge Instructions (Signed)
Take Tylenol for pain and if you develop any fever.   Take Amoxil as directed for the infection. Recheck with your primary care provider in 3-4 days if no better.

## 2019-06-05 DIAGNOSIS — C109 Malignant neoplasm of oropharynx, unspecified: Secondary | ICD-10-CM | POA: Diagnosis not present

## 2019-06-17 ENCOUNTER — Emergency Department (HOSPITAL_COMMUNITY): Payer: Medicare PPO

## 2019-06-17 ENCOUNTER — Encounter (HOSPITAL_COMMUNITY): Payer: Self-pay

## 2019-06-17 ENCOUNTER — Emergency Department (HOSPITAL_COMMUNITY)
Admission: EM | Admit: 2019-06-17 | Discharge: 2019-06-17 | Disposition: A | Discharge status: Home / Self Care | Payer: Medicare PPO | Attending: Emergency Medicine | Admitting: Emergency Medicine

## 2019-06-17 DIAGNOSIS — E119 Type 2 diabetes mellitus without complications: Secondary | ICD-10-CM | POA: Insufficient documentation

## 2019-06-17 DIAGNOSIS — R0602 Shortness of breath: Secondary | ICD-10-CM | POA: Insufficient documentation

## 2019-06-17 DIAGNOSIS — I252 Old myocardial infarction: Secondary | ICD-10-CM | POA: Diagnosis not present

## 2019-06-17 DIAGNOSIS — R0789 Other chest pain: Secondary | ICD-10-CM | POA: Diagnosis not present

## 2019-06-17 DIAGNOSIS — R52 Pain, unspecified: Secondary | ICD-10-CM | POA: Diagnosis not present

## 2019-06-17 DIAGNOSIS — E1165 Type 2 diabetes mellitus with hyperglycemia: Secondary | ICD-10-CM | POA: Diagnosis not present

## 2019-06-17 DIAGNOSIS — I639 Cerebral infarction, unspecified: Secondary | ICD-10-CM | POA: Diagnosis not present

## 2019-06-17 DIAGNOSIS — Z7982 Long term (current) use of aspirin: Secondary | ICD-10-CM | POA: Diagnosis not present

## 2019-06-17 DIAGNOSIS — I10 Essential (primary) hypertension: Secondary | ICD-10-CM | POA: Insufficient documentation

## 2019-06-17 DIAGNOSIS — Z955 Presence of coronary angioplasty implant and graft: Secondary | ICD-10-CM | POA: Insufficient documentation

## 2019-06-17 DIAGNOSIS — Z79899 Other long term (current) drug therapy: Secondary | ICD-10-CM | POA: Insufficient documentation

## 2019-06-17 DIAGNOSIS — Z930 Tracheostomy status: Secondary | ICD-10-CM | POA: Diagnosis not present

## 2019-06-17 DIAGNOSIS — I2510 Atherosclerotic heart disease of native coronary artery without angina pectoris: Secondary | ICD-10-CM | POA: Diagnosis not present

## 2019-06-17 DIAGNOSIS — Z7901 Long term (current) use of anticoagulants: Secondary | ICD-10-CM | POA: Insufficient documentation

## 2019-06-17 DIAGNOSIS — F17210 Nicotine dependence, cigarettes, uncomplicated: Secondary | ICD-10-CM | POA: Insufficient documentation

## 2019-06-17 DIAGNOSIS — R079 Chest pain, unspecified: Secondary | ICD-10-CM | POA: Diagnosis not present

## 2019-06-17 DIAGNOSIS — C069 Malignant neoplasm of mouth, unspecified: Secondary | ICD-10-CM | POA: Diagnosis not present

## 2019-06-17 HISTORY — DX: Malignant (primary) neoplasm, unspecified: C80.1

## 2019-06-17 LAB — CBC WITH DIFFERENTIAL/PLATELET
Abs Immature Granulocytes: 0.18 10*3/uL — ABNORMAL HIGH (ref 0.00–0.07)
Basophils Absolute: 0 10*3/uL (ref 0.0–0.1)
Basophils Relative: 0 %
Eosinophils Absolute: 0.3 10*3/uL (ref 0.0–0.5)
Eosinophils Relative: 2 %
HCT: 28.8 % — ABNORMAL LOW (ref 39.0–52.0)
Hemoglobin: 9.2 g/dL — ABNORMAL LOW (ref 13.0–17.0)
Immature Granulocytes: 1 %
Lymphocytes Relative: 11 %
Lymphs Abs: 1.8 10*3/uL (ref 0.7–4.0)
MCH: 28.8 pg (ref 26.0–34.0)
MCHC: 31.9 g/dL (ref 30.0–36.0)
MCV: 90 fL (ref 80.0–100.0)
Monocytes Absolute: 1.2 10*3/uL — ABNORMAL HIGH (ref 0.1–1.0)
Monocytes Relative: 8 %
Neutro Abs: 12.7 10*3/uL — ABNORMAL HIGH (ref 1.7–7.7)
Neutrophils Relative %: 78 %
Platelets: 559 10*3/uL — ABNORMAL HIGH (ref 150–400)
RBC: 3.2 MIL/uL — ABNORMAL LOW (ref 4.22–5.81)
RDW: 13 % (ref 11.5–15.5)
WBC: 16.2 10*3/uL — ABNORMAL HIGH (ref 4.0–10.5)
nRBC: 0 % (ref 0.0–0.2)

## 2019-06-17 LAB — BASIC METABOLIC PANEL
Anion gap: 15 (ref 5–15)
BUN: 14 mg/dL (ref 6–20)
CO2: 24 mmol/L (ref 22–32)
Calcium: 9.5 mg/dL (ref 8.9–10.3)
Chloride: 97 mmol/L — ABNORMAL LOW (ref 98–111)
Creatinine, Ser: 1.02 mg/dL (ref 0.61–1.24)
GFR calc Af Amer: >60 mL/min (ref >60)
GFR calc non Af Amer: >60 mL/min (ref >60)
Glucose, Bld: 291 mg/dL — ABNORMAL HIGH (ref 70–99)
Potassium: 4.3 mmol/L (ref 3.5–5.1)
Sodium: 136 mmol/L (ref 135–145)

## 2019-06-17 MED ORDER — IOHEXOL 350 MG/ML SOLN
75.0000 mL | Freq: Once | INTRAVENOUS | Status: AC | PRN
  Administered 2019-06-17: 75 mL via INTRAVENOUS

## 2019-06-17 NOTE — ED Notes (Signed)
Ambulated pt with EDP. Pt O2 sat variable.

## 2019-06-17 NOTE — ED Notes (Signed)
RT answered questions and helped to educate pt and SO on the new trach per my request.

## 2019-06-17 NOTE — ED Notes (Signed)
Patient verbalizes understanding of discharge instructions. Opportunity for questioning and answers were provided. Armband removed by staff, pt discharged from ED.  

## 2019-06-17 NOTE — ED Provider Notes (Signed)
[facility] EMERGENCY DEPARTMENT Provider Note   CSN: [id number] Arrival date & time: 06/17/19  1133     History No chief complaint on file.   [name] is a 59 y.o. male.  The history is provided by the patient and medical records. No language interpreter was used.  Shortness of Breath    59 year old male with history of oropharyngeal cancer, status post tracheostomy, neuromuscular disorder, substance abuse, diabetes, depression, CAD, anxiety brought here via EMS from home for evaluation of shortness of breath.  Patient had mouth and throat surgery approximately 3 days ago for oropharyngeal cancer.  He had a tracheostomy tube placed.  He was just discharged home.  This morning he was having trouble breathing.  He endorsed increasing saliva production and felt he was having difficulty tolerating his mucus.  It is difficult to obtain history from patient as he is having difficulty speaking with a new tracheostomy tube.  He does not complain of any fever or chills.  He report after being suctioned prior to my interview, he feels much better.  Denies any leg pain or calf swelling.  Denies any significant pain aside from 4 out of 10 pain to his left shoulder from a recent bone graft for the surgery.  Past Medical History:  Diagnosis Date  . Anxiety   . Chronic pancreatitis ([place])   . Coronary artery disease   . Depression   . Diabetes mellitus without complication ([place])   . Hyperlipidemia   . Hypertension   . Myocardial infarction ([place])   . Neuromuscular disorder ([place])   . Sciatica   . Seizures ([place])   . Stroke ([place])   . Substance abuse [facility])     Patient Active Problem List   Diagnosis Date Noted  . Schizophrenia spectrum disorder with psychotic disorder type not yet determined ([place]) 09/24/2015  . Intermittent explosive disorder 09/20/2015  . Severe recurrent major depressive disorder with psychotic features ([place]) 09/20/2015  . Suicidal ideation  09/20/2015  . Homicidal ideation 09/20/2015  . Chest pain at rest 03/19/2013  . Acute non Q wave MI (myocardial infarction), initial episode of care ([place]) 03/06/2013  . Chronic sciatica of left side 09/10/2012  . Personality disorder ([place]) 11/10/2011  . Alcohol use disorder, mild, abuse 10/01/2011  . NSTEMI (non-ST elevated myocardial infarction) ([place]) 10/01/2011  . Erectile dysfunction 08/01/2010  . Urge incontinence of urine 08/01/2010  . Chronic alcoholic pancreatitis ([place]) 07/24/2010  . Tobacco use disorder 07/24/2010  . Hypertension 07/24/2010  . Hyperlipidemia 07/24/2010  . CVA (cerebral vascular accident) ([place]) 07/24/2010  . Chronic back pain 07/24/2010    Past Surgical History:  Procedure Laterality Date  . BACK SURGERY    . CORONARY STENT PLACEMENT    . LEFT HEART CATHETERIZATION WITH CORONARY ANGIOGRAM N/A 10/01/2011   Procedure: LEFT HEART CATHETERIZATION WITH CORONARY ANGIOGRAM;  Surgeon: Clent Demark, MD;  Location: [facility] CATH LAB;  Service: Cardiovascular;  Laterality: N/A;  . LEFT HEART CATHETERIZATION WITH CORONARY ANGIOGRAM N/A 03/06/2013   Procedure: LEFT HEART CATHETERIZATION WITH CORONARY ANGIOGRAM;  Surgeon: Clent Demark, MD;  Location: [facility] CATH LAB;  Service: Cardiovascular;  Laterality: N/A;  . PERCUTANEOUS CORONARY STENT INTERVENTION (PCI-S) N/A 10/02/2011   Procedure: PERCUTANEOUS CORONARY STENT INTERVENTION (PCI-S);  Surgeon: Clent Demark, MD;  Location: [facility] CATH LAB;  Service: Cardiovascular;  Laterality: N/A;       Family History  Family history unknown: Yes    Social History   Tobacco Use  .  Smoking status: Current Every Day Smoker    Packs/day: 1.00    Years: 40.00    Pack years: 40.00    Types: Cigarettes  . Smokeless tobacco: Never Used  Substance Use Topics  . Alcohol use: Yes    Comment: 1-2/week  . Drug use: Never    Home Medications Prior to Admission medications   Medication Sig Start Date End Date Taking? Authorizing  Provider  amoxicillin (AMOXIL) 500 MG capsule Take 2 capsules (1,000 mg total) by mouth 2 (two) times daily. 05/15/19   Charlann Lange, PA-C  aspirin EC 81 MG EC tablet Take 1 tablet (81 mg total) by mouth daily. Patient taking differently: Take 81 mg by mouth 3 (three) times a week.  03/08/13   Charolette Forward, MD  atorvastatin (LIPITOR) 80 MG tablet Take 80 mg by mouth daily.    [provider]  clopidogrel (PLAVIX) 75 MG tablet Take 75 mg by mouth daily.    [provider]  gabapentin (NEURONTIN) 300 MG capsule Take 300 mg by mouth daily.     [provider]  mirtazapine (REMERON) 15 MG tablet Take 1 tablet (15 mg total) by mouth at bedtime. 09/26/15   Pucilowska, Jolanta B, MD  OXcarbazepine (TRILEPTAL) 300 MG tablet Take 1 tablet (300 mg total) by mouth 2 (two) times daily. 09/26/15   Pucilowska, Jolanta B, MD  pantoprazole (PROTONIX) 20 MG tablet Take 1 tablet (20 mg total) by mouth daily. 04/24/19   Deliah Boston, PA-C  predniSONE (DELTASONE) 20 MG tablet Take 2 times a day for 5 days then one a day until gone 10/31/18   Raylene Everts, MD  risperiDONE (RISPERDAL) 3 MG tablet Take 1 tablet (3 mg total) by mouth 2 (two) times daily. 09/26/15   Pucilowska, Herma Ard B, MD  ticagrelor (BRILINTA) 90 MG TABS tablet Take 1 tablet by mouth 2 (two) times daily.    [provider]  tiZANidine (ZANAFLEX) 4 MG tablet Take 1-2 tablets (4-8 mg total) by mouth every 6 (six) hours as needed for muscle spasms. 10/31/18   Raylene Everts, MD  zolpidem (AMBIEN) 5 MG tablet Take 1 tablet (5 mg total) by mouth at bedtime as needed for sleep. 09/26/15   Pucilowska, Wardell Honour, MD    Allergies    Morphine and related  Review of Systems   Review of Systems  Respiratory: Positive for shortness of breath.     Physical Exam Updated Vital Signs There were no vitals taken for this visit.  Physical Exam Vitals and nursing note reviewed.  Constitutional:      General: He is  not in acute distress.    Appearance: He is well-developed.  HENT:     Head: Atraumatic.  Eyes:     Conjunctiva/sclera: Conjunctivae normal.  Neck:     Comments: Well-appearing horizontal surgical wound across his anterior neck with staples in place nontender to palpation without signs of infection.  Fresh tracheostomy tube in place. Cardiovascular:     Rate and Rhythm: Tachycardia present.  Pulmonary:     Effort: Pulmonary effort is normal.     Breath sounds: Normal breath sounds. No decreased breath sounds, wheezing, rhonchi or rales.  Chest:     Chest wall: No tenderness.  Musculoskeletal:     Cervical back: Neck supple.  Skin:    Findings: No rash.  Neurological:     Mental Status: He is alert.     ED Results / Procedures / Treatments  Labs (all labs ordered are listed, but only abnormal results are displayed) Labs Reviewed  BASIC METABOLIC PANEL - Abnormal; Notable for the following components:      Result Value   Chloride 97 (*)    Glucose, Bld 291 (*)    All other components within normal limits  CBC WITH DIFFERENTIAL/PLATELET - Abnormal; Notable for the following components:   WBC 16.2 (*)    RBC 3.20 (*)    Hemoglobin 9.2 (*)    HCT 28.8 (*)    Platelets 559 (*)    Neutro Abs 12.7 (*)    Monocytes Absolute 1.2 (*)    Abs Immature Granulocytes 0.18 (*)    All other components within normal limits    EKG None   Date: 06/17/2019  Rate: 114  Rhythm: sinus tachycardia  QRS Axis: normal  Intervals: normal  ST/T Wave abnormalities: normal  Conduction Disutrbances: none  Narrative Interpretation:   Old EKG Reviewed: No significant changes noted     Radiology CT Angio Chest PE W and/or Wo Contrast  Result Date: 06/17/2019 CLINICAL DATA:  59 y/o Male mouth cancer and had a bone graph and trach on Thursday. Pt reporting pain in left shoulder, sob and chest tightness. EXAM: CT ANGIOGRAPHY CHEST WITH CONTRAST TECHNIQUE: Multidetector CT imaging of the chest  was performed using the standard protocol during bolus administration of intravenous contrast. Multiplanar CT image reconstructions and MIPs were obtained to evaluate the vascular anatomy. CONTRAST:  79mL OMNIPAQUE IOHEXOL 350 MG/ML SOLN COMPARISON:  09/24/2008 FINDINGS: Cardiovascular: There is satisfactory opacification of the pulmonary arteries to the segmental level. There is no evidence of a pulmonary embolism. The heart is normal in size and configuration. No pericardial effusion. Right coronary artery stent. Great vessels are normal in caliber. No aortic dissection or atherosclerosis. Mediastinum/Nodes: Well-positioned tracheostomy tube. No neck base, axillary, mediastinal or hilar masses or pathologically enlarged lymph nodes. Nasal/orogastric tube passes through the esophagus into the stomach. Trachea is unremarkable. Lungs/Pleura: Mild to moderate centrilobular emphysema. No lung consolidation. No mass or suspicious nodule. No pleural effusion or pneumothorax. Upper Abdomen: Partly image calcifications the pancreas consistent with chronic pancreatitis. No acute findings. Musculoskeletal: No chest wall abnormality. No acute or significant osseous findings. Review of the MIP images confirms the above findings. IMPRESSION: 1. No evidence of a pulmonary embolism.  No acute findings. 2. Mild to moderate emphysema. Emphysema (ICD10-J43.9). Electronically Signed   By: Lajean Manes M.D.   On: 06/17/2019 14:24   DG Chest Port 1 View  Result Date: 06/17/2019 CLINICAL DATA:  Shortness of breath EXAM: PORTABLE CHEST 1 VIEW COMPARISON:  Chest x-ray dated 04/24/2019. FINDINGS: Tracheostomy tube appears appropriately positioned in the midline. Enteric tube passes below the diaphragm. Surgical staples and clips overlie the lower neck, incompletely imaged. Additional surgical staples overlying the LEFT lateral chest wall. Heart size and mediastinal contours are within normal limits. Lungs are clear. No pleural effusion  or pneumothorax is seen. Osseous structures about the chest are unremarkable. IMPRESSION: 1. No active disease. No evidence of pneumonia or pulmonary edema. 2. Tracheostomy tube appears appropriately positioned in the midline. Electronically Signed   By: Franki Cabot M.D.   On: 06/17/2019 12:01    Procedures Procedures (including critical care time)  Medications Ordered in ED Medications  iohexol (OMNIPAQUE) 350 MG/ML injection 75 mL (75 mLs Intravenous Contrast Given 06/17/19 1415)    ED Course  I have reviewed the triage vital signs and the nursing notes.  Pertinent labs &  imaging results that were available during my care of the patient were reviewed by me and considered in my medical decision making (see chart for details).    MDM Rules/Calculators/A&P                      BP (!) 154/75   Pulse (!) 106   Temp 98.6 F (37 C) (Oral)   Resp 15   SpO2 100%   Final Clinical Impression(s) / ED Diagnoses Final diagnoses:  Shortness of breath    Rx / DC Orders ED Discharge Orders    None     11:51 AM Patient with history of oropharyngeal cancer who underwent surgical resection of his tumor bulk in his throat mouth and neck approximately 3 days ago with a freshly placed tracheostomy tube is here with complaints of shortness of breath.  Shortness of breath is likely secondary to over productions of his saliva status post new tracheostomy.  After suctioning, patient felt much better.  He appears to be in no acute respiratory discomfort.  It is difficult to obtain history from patient as he is having trouble communicating with trach in place.  Screening chest x-ray and labs ordered.  Low suspicion for PE causing his shortness of breath.  Low suspicion for COVID-19.  1:18 PM Patient has elevated white count of 16.2, likely due to recent surgical management. Hemoglobin is 9.0 dropped from prior value. When ambulating, patient reported no shortness of breath however O2 sats drops to the  low 80s. In the room, O2 sats is 96 on room air. However, patient is currently tachycardic with heart rate in the 120s. In the setting of recent surgery, and history of malignancy including shortness of breath, will obtain chest CT angiogram to rule out PE. However, I suspect patient's shortness of breath is likely from over productions of saliva after surgery of his oropharynx.  3:38 PM Chest CT angiogram was obtained demonstrate no evidence of PE and no acute finding.  Evidence of emphysema were noted.  At this time, patient request to be discharged, he was able to pull his close without any difficulty.  Family member is present with his side.  Patient certainly can follow-up outpatient with his doctor for further care.  Return precaution discussed.   Domenic Moras, PA-C 06/17/19 [place], MD 06/18/19 [id number]

## 2019-06-17 NOTE — ED Triage Notes (Signed)
[facility] reports pt has mouth cancer and had a bone graph and trach on Thursday. Pt reporting pain in left shoulder, sob and chest tightness. EMS suctioned and that helped with tightness. Sinus tach, rr 30 cbg 356 No food or meds today.

## 2019-06-17 NOTE — Discharge Instructions (Signed)
Your shortness of breath is likely due to overproduction of saliva causing trouble breathing.  Call and follow up closely with your doctor for further management of your condition. Return if you have any concerns.

## 2019-06-17 NOTE — ED Notes (Signed)
Pt transported to CT ?

## 2019-07-05 DIAGNOSIS — E1110 Type 2 diabetes mellitus with ketoacidosis without coma: Secondary | ICD-10-CM | POA: Diagnosis not present

## 2019-07-05 DIAGNOSIS — E870 Hyperosmolality and hypernatremia: Secondary | ICD-10-CM | POA: Diagnosis not present

## 2019-07-05 DIAGNOSIS — D72829 Elevated white blood cell count, unspecified: Secondary | ICD-10-CM | POA: Diagnosis not present

## 2019-07-05 DIAGNOSIS — Z87891 Personal history of nicotine dependence: Secondary | ICD-10-CM | POA: Diagnosis not present

## 2019-07-05 DIAGNOSIS — N179 Acute kidney failure, unspecified: Secondary | ICD-10-CM | POA: Diagnosis not present

## 2019-07-05 DIAGNOSIS — E861 Hypovolemia: Secondary | ICD-10-CM | POA: Diagnosis not present

## 2019-07-05 DIAGNOSIS — R4182 Altered mental status, unspecified: Secondary | ICD-10-CM | POA: Diagnosis not present

## 2019-07-05 DIAGNOSIS — Z7984 Long term (current) use of oral hypoglycemic drugs: Secondary | ICD-10-CM | POA: Diagnosis not present

## 2019-07-06 DIAGNOSIS — J9601 Acute respiratory failure with hypoxia: Secondary | ICD-10-CM | POA: Diagnosis not present

## 2019-07-06 DIAGNOSIS — N179 Acute kidney failure, unspecified: Secondary | ICD-10-CM | POA: Diagnosis not present

## 2019-07-06 DIAGNOSIS — Z8674 Personal history of sudden cardiac arrest: Secondary | ICD-10-CM | POA: Diagnosis not present

## 2019-07-06 DIAGNOSIS — E1110 Type 2 diabetes mellitus with ketoacidosis without coma: Secondary | ICD-10-CM | POA: Diagnosis not present

## 2019-07-06 DIAGNOSIS — Z87891 Personal history of nicotine dependence: Secondary | ICD-10-CM | POA: Diagnosis not present

## 2019-07-06 DIAGNOSIS — I469 Cardiac arrest, cause unspecified: Secondary | ICD-10-CM | POA: Diagnosis not present

## 2019-07-06 DIAGNOSIS — E870 Hyperosmolality and hypernatremia: Secondary | ICD-10-CM | POA: Diagnosis not present

## 2019-07-07 DIAGNOSIS — N179 Acute kidney failure, unspecified: Secondary | ICD-10-CM | POA: Diagnosis not present

## 2019-07-07 DIAGNOSIS — E1110 Type 2 diabetes mellitus with ketoacidosis without coma: Secondary | ICD-10-CM | POA: Diagnosis not present

## 2019-07-07 DIAGNOSIS — I469 Cardiac arrest, cause unspecified: Secondary | ICD-10-CM | POA: Diagnosis not present

## 2019-07-07 DIAGNOSIS — E870 Hyperosmolality and hypernatremia: Secondary | ICD-10-CM | POA: Diagnosis not present

## 2019-07-07 DIAGNOSIS — J9601 Acute respiratory failure with hypoxia: Secondary | ICD-10-CM | POA: Diagnosis not present

## 2019-07-07 DIAGNOSIS — Z87891 Personal history of nicotine dependence: Secondary | ICD-10-CM | POA: Diagnosis not present

## 2019-07-07 DIAGNOSIS — Z8674 Personal history of sudden cardiac arrest: Secondary | ICD-10-CM | POA: Diagnosis not present

## 2019-07-31 HISTORY — PX: GASTROSTOMY TUBE PLACEMENT: SHX655

## 2019-08-04 ENCOUNTER — Ambulatory Visit
Admission: RE | Admit: 2019-08-04 | Discharge: 2019-08-04 | Disposition: A | Discharge status: Home / Self Care | Payer: Self-pay | Source: Ambulatory Visit | Attending: Radiation Oncology | Admitting: Radiation Oncology

## 2019-08-04 ENCOUNTER — Other Ambulatory Visit: Payer: Self-pay

## 2019-08-04 DIAGNOSIS — C069 Malignant neoplasm of mouth, unspecified: Secondary | ICD-10-CM

## 2019-08-04 NOTE — Progress Notes (Signed)
Oncology Nurse Navigator Documentation   Fax sent to [facility] requesting the following images be pushed to [employer]:  . 07/06/2019        CT Head wo contrast . 07/06/19             CT chest/abdomen/pelvis wo contast . 07/06/19            CT neck/thyroid WO contrast             07/20/2019   CT Head w/o contrast . 07/27/2019         MRI brain w/o contrast (Neurology [place] Protocol)   Notification of successful fax transmission received. Order placed for assignment to [facility].   Harlow Asa RN, BSN, OCN Head & Neck Oncology Nurse [place] [facility] Phone # [phone]  Fax # [phone]

## 2019-08-07 DIAGNOSIS — R1310 Dysphagia, unspecified: Secondary | ICD-10-CM | POA: Diagnosis not present

## 2019-08-07 DIAGNOSIS — C069 Malignant neoplasm of mouth, unspecified: Secondary | ICD-10-CM | POA: Diagnosis not present

## 2019-08-07 NOTE — Progress Notes (Signed)
Oncology Nurse Navigator Documentation  Placed introductory call to new referral patient [name] and his wife [name]  Introduced myself as the H&N oncology nurse navigator that works with Dr. Isidore Moos to whom he has been referred by Dr. Hendricks Limes  He confirmed understanding of referral.  Briefly explained my role as his navigator, provided my contact information.   Confirmed understanding of upcoming appts and [place] location, explained arrival and registration process.  I explained the purpose of a dental evaluation prior to starting RT, indicated he wd be contacted by [facility] to arrange an appt.    I encouraged him to call with questions/concerns as he moves forward with appts and procedures.    He verbalized understanding of information provided, expressed appreciation for my call.   Navigator Initial Assessment . Employment Status: Unemployed . Currently on FMLA / STD: He is on disibility . Living Situation: He lives with his wife, [name] . Support System: wife . PCP: Felicita Gage at the [facility] . PCD: None . Financial Concerns: no . Transportation Needs: no . Sensory Deficits: no . Language Barriers/Interpreter Needed:  no . Ambulation Needs: no . DME Used in Home: no . Psychosocial Needs:  no . Concerns/Needs Understanding Cancer:  addressed/answered by navigator to best of ability . Self-Expressed Needs: no  Harlow Asa RN, BSN, OCN Head & Neck Oncology Nurse [place] at [facility] Phone # [phone]  Fax # [phone]

## 2019-08-07 NOTE — Progress Notes (Addendum)
Head and Neck Cancer Location of Tumor / Histology: Squamous cell carcinoma of oral cavity  Biopsies revealed:   Nutrition Status Yes No Comments  Weight changes? []  [x]    Swallowing concerns? [x]  []    PEG? [x]  []     Referrals Yes No Comments  Social Work? [x]  []    Dentistry? [x]  []    Swallowing therapy? [x]  []    Nutrition? [x]  []    Med/Onc? []  [x]     Safety Issues Yes No Comments  Prior radiation? []  [x]    Pacemaker/ICD? []  [x]    Possible current pregnancy? []  [x]    Is the patient on methotrexate? []  [x]     Tobacco/Marijuana/Snuff/ETOH use: Currently not using alcohol or cigarettes. (History of drinking about a pint of hard liquor every day, and smoking about a 1 pack cigarettes daily)  Past/Anticipated interventions by otolaryngology, if any:  06/08/2019 Dr. Kandace Parkins   Past/Anticipated interventions by medical oncology, if any:  Nothing of note yet   Current Complaints / other details:  Nothing of note  Vitals:   08/08/19 1022  BP: 108/71  Pulse: 88  Resp: 18  Temp: (!) 97.1 F (36.2 C)  TempSrc: Temporal  SpO2: 100%  Weight: 130 lb 2 oz (59 kg)  Height: 5\' 10"  (1.778 m)

## 2019-08-08 ENCOUNTER — Ambulatory Visit
Admission: RE | Admit: 2019-08-08 | Discharge: 2019-08-08 | Disposition: A | Discharge status: Home / Self Care | Payer: Medicare PPO | Source: Ambulatory Visit | Attending: Radiation Oncology | Admitting: Radiation Oncology

## 2019-08-08 ENCOUNTER — Encounter: Payer: Self-pay | Admitting: Radiation Oncology

## 2019-08-08 ENCOUNTER — Other Ambulatory Visit: Payer: Self-pay

## 2019-08-08 VITALS — BP 108/71 | HR 88 | Temp 97.1°F | Resp 18 | Ht 70.0 in | Wt 130.1 lb

## 2019-08-08 DIAGNOSIS — R634 Abnormal weight loss: Secondary | ICD-10-CM

## 2019-08-08 DIAGNOSIS — C069 Malignant neoplasm of mouth, unspecified: Secondary | ICD-10-CM

## 2019-08-08 DIAGNOSIS — F17211 Nicotine dependence, cigarettes, in remission: Secondary | ICD-10-CM | POA: Diagnosis not present

## 2019-08-08 DIAGNOSIS — C770 Secondary and unspecified malignant neoplasm of lymph nodes of head, face and neck: Secondary | ICD-10-CM | POA: Diagnosis not present

## 2019-08-08 DIAGNOSIS — C040 Malignant neoplasm of anterior floor of mouth: Secondary | ICD-10-CM | POA: Diagnosis not present

## 2019-08-08 DIAGNOSIS — Z9049 Acquired absence of other specified parts of digestive tract: Secondary | ICD-10-CM | POA: Diagnosis not present

## 2019-08-08 DIAGNOSIS — R1310 Dysphagia, unspecified: Secondary | ICD-10-CM | POA: Diagnosis not present

## 2019-08-08 DIAGNOSIS — R5381 Other malaise: Secondary | ICD-10-CM

## 2019-08-08 MED ORDER — FLUCONAZOLE 100 MG PO TABS: ORAL_TABLET | ORAL | 0 refills | Status: DC

## 2019-08-08 NOTE — Progress Notes (Signed)
Radiation Oncology         [phone] ________________________________  Initial outpatient Consultation  Name: [name] MRN: [id number]  Date: 08/08/2019  DOB: 05/12/1960  [id number], Cleaster Corin, NP  [place], Neomia Dear, MD   REFERRING PHYSICIAN: Wyline Beady, MD  DIAGNOSIS: C04.0    ICD-10-CM   1. 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 Loss of weight  R63.4   4. 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 male who presented with dizziness, lymphadenopathy of the neck, difficulty swallowing and talking.  CT scan of the neck was obtained on 05/15/2019 demonstrating findings consistent with acute pharyngitis.  There were enlarged right level 2 lymph nodes felt to be reactive.  He was ultimately seen by Dr. Hendricks Limes of otolaryngology at [facility].  A floor of mouth lesion was identified.  Clinically he was felt to have T2/T3N0 disease and surgery was recommended.  On 06/05/2019 CT of chest was negative for metastatic disease.  06/08/2019 he underwent resection of the floor of mouth cancer with partial glossectomy bilateral neck dissection tracheostomy, mandibulectomy, plating, hyoid suspension bilaterally, free flap scapular (left).  Pathology report is pasted below, but in  summary, he has pathologic T4aN1 disease.  The tumor was 2.2 cm with perineural invasion.  Depth of invasion 14 mm.  Bone invasion noted.  Margins negative.  A total of 41 nodes were removed and 1 node positive from level 1B, laterality unclear.  No extracapsular extension.  He has seen supportive providers at [facility] but has lost touch with them and states that transportation has been an issue.  He is here with his significant other who confirms that.  They asked to have services such as swallowing therapy, nutrition, etc. provided here.  Nutrition Status Yes No Comments  Weight changes? []  [x]    Swallowing concerns? [x]  []    PEG? [x]  []     Referrals Yes No Comments  Social Work? [x]  []    Dentistry? [x]  []    Swallowing therapy? [x]  []    Nutrition? [x]  []    Med/Onc? []  [x]     Safety Issues Yes No Comments  Prior radiation? []  [x]    Pacemaker/ICD? []  [x]    Possible current pregnancy? []  [x]    Is the patient on methotrexate? []  [x]     Tobacco/Marijuana/Snuff/ETOH use: Currently not using alcohol or cigarettes. (History of drinking about a pint of hard liquor every day, and smoking about a 1 pack cigarettes daily)   PATHOLOGY:  FINAL PATHOLOGIC DIAGNOSIS MICROSCOPIC EXAMINATION AND DIAGNOSIS  A. LYMPH NODES, "LEVEL 1", EXCISION:    Three lymph nodes, negative for metastasis (0/3).  B. ORAL CAVITY, "RIGHT VENTRAL TONGUE", BIOPSY:    No malignancy identified.  C. ORAL CAVITY, "LEFT VENTRAL TONGUE",  BIOPSY:    No malignancy identified.  D. ORAL CAVITY, "RIGHT FLOOR OF MOUTH", BIOPSY:    No malignancy identified.  E. ORAL CAVITY, "LEFT FLOOR OF MOUTH", BIOPSY:    No malignancy identified.  F. ORAL CAVITY, "DEEP TONGUE", BIOPSY:    No malignancy identified.  G. LYMPH NODES, "RIGHT 2, 3, AND 4", EXCISION:    Twenty-one lymph nodes, negative for metastasis (0/21).  H. LYMPH NODES, "LEFT 2A, 3 AND 4", EXCISION:     Nine lymph nodes, negative  for metastasis (0/9).  I. ORAL CAVITY, "MANDIBLE/ FLOOR OF MOUTH/ TONGUE/ BILATERAL LEVEL 1B", COMPOSITE RESECTION:    Invasive squamous cell carcinoma, well differentiated,      keratinizing.    Tumor size: 2.2 cm.    Tumor depth of invasion: 14 millimeters.    Tumor invades mandible bone.    Perineural invasion identified.    Margins negative for malignancy.    Bone margins negative for malignancy.    Metastatic carcinoma in 1 of 9 lymph nodes (1/9).    Metastatic deposit size: 0.8 cm.    Negative for extranodal extension.    Benign salivary gland tissue with chronic inflammation,      mild fibrosis and atrophy.    Pathologic stage: pT4a pN1.    See tumor protocol summary below.    See comment.  J. LYMPH NODES, "LEFT LEVEL 2B", EXCISION:    Three lymph nodes, negative for metastasis (0/3).  K. LYMPH NODES, "ADDITIONAL LEVEL 4", EXCISION:    Five lymph nodes, negative for metastasis (0/5).  L. ORAL CAVITY, "RIGHT ALVEOLAR RIDGE", BIOPSY:    No malignancy identified.  COMMENT: Immunohistochemistry for p16 on a representative section of the tumor is negative.   SURGICAL PATHOLOGY CANCER CASE SUMMARY (AJCC 8th Edition): LIP AND ORAL CAVITY   PROCEDURE: Composite resection with floor of mouth resection, anterior segmental mandibulectomy, ventral glossectomy TUMOR SITE: Oral cavity: Floor of mouth TUMOR LATERALITY: Right, Midline, Left TUMOR FOCALITY: Unifocal TUMOR SIZE: GREATEST DIMENSION: 2.2 cm TUMOR DEPTH OF INVASION (DOI): 14 mm HISTOLOGIC TYPE: Squamous cell carcinoma, conventional HISTOLOGIC GRADE: G1: Well differentiated TUMOR EXTENSION: Tumor invades mandible bone SPECIMEN MARGINS: Uninvolved by invasive tumor  DISTANCE FROM CLOSEST MARGIN: at least 4 mm  SPECIFY MARGIN(S): Posterior soft tissue  Comment: Multiple separately submitted margins LYMPHOVASCULAR INVASION: Not  identified PERINEURAL INVASION: Present REGIONAL LYMPH NODES:  NUMBER OF LYMPH NODES INVOLVED: 1  NUMBER OF LYMPH NODES EXAMINED: 41  LATERALITY OF LYMPH NODES INVOLVED: Ipsilateral  SIZE OF LARGEST METASTATIC DEPOSIT: 0.8 cm  EXTRANODAL EXTENSION: Not identified PATHOLOGIC STAGE CLASSIFICATION (pTNM, AJCC 8TH Ed): pT4a pN1    PREVIOUS RADIATION THERAPY: No  PAST MEDICAL HISTORY:  has a past medical history of Anxiety, Cancer ([place]), Chronic pancreatitis ([place]), Coronary artery disease, Depression, Diabetes mellitus without complication ([place]), Hyperlipidemia, Hypertension, Myocardial infarction ([place]), Neuromuscular disorder ([place]), Sciatica, Seizures ([place]), Stroke ([place]), and Substance abuse ([place]).    PAST SURGICAL HISTORY: Past Surgical History:  Procedure Laterality Date  . BACK SURGERY    . CORONARY STENT PLACEMENT    . GASTROSTOMY TUBE PLACEMENT Left 07/31/2019   [facility]  . LEFT HEART CATHETERIZATION WITH CORONARY ANGIOGRAM N/A 10/01/2011   Procedure: LEFT HEART CATHETERIZATION WITH CORONARY ANGIOGRAM;  Surgeon: Clent Demark, MD;  Location: [facility] CATH LAB;  Service: Cardiovascular;  Laterality: N/A;  . LEFT HEART CATHETERIZATION WITH CORONARY ANGIOGRAM N/A 03/06/2013   Procedure: LEFT HEART CATHETERIZATION WITH CORONARY ANGIOGRAM;  Surgeon: Clent Demark, MD;  Location: [facility]  CATH LAB;  Service: Cardiovascular;  Laterality: N/A;  . PERCUTANEOUS CORONARY STENT INTERVENTION (PCI-S) N/A 10/02/2011   Procedure: PERCUTANEOUS CORONARY STENT INTERVENTION (PCI-S);  Surgeon: Clent Demark, MD;  Location: [facility] CATH LAB;  Service: Cardiovascular;  Laterality: N/A;  . TRACHEOSTOMY CLOSURE      FAMILY HISTORY: Family history is unknown by patient.  SOCIAL HISTORY:  reports that he has quit smoking. His smoking use included cigarettes. He has a 40.00 pack-year smoking history. He has never used smokeless tobacco. He reports previous alcohol use. He reports that he does not use  drugs.  ALLERGIES: Morphine and related  MEDICATIONS:  Current Outpatient Medications  Medication Sig Dispense Refill  . aspirin EC 81 MG EC tablet Take 1 tablet (81 mg total) by mouth daily. (Patient taking differently: Take 81 mg by mouth 3 (three) times a week. ) 30 tablet 3  . cloNIDine (CATAPRES) 0.2 MG tablet Take 0.2 mg by mouth daily.    . clopidogrel (PLAVIX) 75 MG tablet Take 75 mg by mouth daily.    Marland Kitchen glipiZIDE (GLUCOTROL) 5 MG tablet Take 5 mg by mouth 2 (two) times daily before a meal.    . lisinopril (ZESTRIL) 20 MG tablet Take 20 mg by mouth daily.    . magnesium oxide (MAG-OX) 400 MG tablet Take 400 mg by mouth in the morning and at bedtime.    . metoprolol tartrate (LOPRESSOR) 50 MG tablet Take 50 mg by mouth in the morning and at bedtime.    . pantoprazole sodium (PROTONIX) 40 mg/20 mL PACK Give 40 mg by tube daily.    . phenytoin (DILANTIN) 125 MG/5ML suspension Take 100 mg by mouth 3 (three) times daily.    Marland Kitchen valproic acid (DEPAKENE) 250 MG/5ML solution Take 500 mg by mouth in the morning and at bedtime.    Marland Kitchen amoxicillin (AMOXIL) 500 MG capsule Take 2 capsules (1,000 mg total) by mouth 2 (two) times daily. (Patient not taking: Reported on 08/08/2019) 40 capsule 0  . atorvastatin (LIPITOR) 80 MG tablet Take 80 mg by mouth daily.    . fluconazole (DIFLUCAN) 100 MG tablet Take 2 tablets today, then 1 tablet daily x 6 more days. 8 tablet 0  . gabapentin (NEURONTIN) 300 MG capsule Take 300 mg by mouth daily.     . hydrOXYzine (ATARAX/VISTARIL) 25 MG tablet Take 25 mg by mouth as needed.    . mirtazapine (REMERON) 15 MG tablet Take 1 tablet (15 mg total) by mouth at bedtime. (Patient not taking: Reported on 08/08/2019) 30 tablet 1  . OXcarbazepine (TRILEPTAL) 300 MG tablet Take 1 tablet (300 mg total) by mouth 2 (two) times daily. (Patient not taking: Reported on 08/08/2019) 60 tablet 1  . pantoprazole (PROTONIX) 20 MG tablet Take 1 tablet (20 mg total) by mouth daily. (Patient not  taking: Reported on 08/08/2019) 30 tablet 0  . predniSONE (DELTASONE) 20 MG tablet Take 2 times a day for 5 days then one a day until gone (Patient not taking: Reported on 08/08/2019) 12 tablet 0  . risperiDONE (RISPERDAL) 3 MG tablet Take 1 tablet (3 mg total) by mouth 2 (two) times daily. (Patient not taking: Reported on 08/08/2019) 60 tablet 1  . ticagrelor (BRILINTA) 90 MG TABS tablet Take 1 tablet by mouth 2 (two) times daily.    Marland Kitchen tiZANidine (ZANAFLEX) 4 MG tablet Take 1-2 tablets (4-8 mg total) by mouth every 6 (six) hours as needed for muscle spasms. (Patient not taking: Reported on 08/08/2019) 21  tablet 0  . zolpidem (AMBIEN) 5 MG tablet Take 1 tablet (5 mg total) by mouth at bedtime as needed for sleep. (Patient not taking: Reported on 08/08/2019) 30 tablet 0   No current facility-administered medications for this encounter.    REVIEW OF SYSTEMS:  Notable for that above.   PHYSICAL EXAM:  height is 5\' 10"  (1.778 m) and weight is 130 lb 2 oz (59 kg). His temporal temperature is 97.1 F (36.2 C) (abnormal). His blood pressure is 108/71 and his pulse is 88. His respiration is 18 and oxygen saturation is 100%.   General: Alert and oriented, in no acute distress, very thin -in a wheelchair HEENT: Oral cavity is notable for copious pooled thick secretions.  There is a white coating to the floor of mouth surgical bed that may be thrush.   Neck: Neck is notable for no palpable adenopathy -surgical scars have healed satisfactorily Extremities: No edema. Lymphatics: see Neck Exam Skin: No concerning lesions. Musculoskeletal:   Evidence of muscle wasting    ECOG = 3  0 - Asymptomatic (Fully active, able to carry on all predisease activities without restriction)  1 - Symptomatic but completely ambulatory (Restricted in physically strenuous activity but ambulatory and able to carry out work of a light or sedentary nature. For example, light housework, office work)  2 - Symptomatic, <50% in bed  during the day (Ambulatory and capable of all self care but unable to carry out any work activities. Up and about more than 50% of waking hours)  3 - Symptomatic, >50% in bed, but not bedbound (Capable of only limited self-care, confined to bed or chair 50% or more of waking hours)  4 - Bedbound (Completely disabled. Cannot carry on any self-care. Totally confined to bed or chair)  5 - Death   Eustace Pen MM, Creech RH, Tormey DC, et al. [phone]). "Toxicity and response criteria of the [facility]". [place] Oncol. 5 (6): 649-55   LABORATORY DATA:  Lab Results  Component Value Date   WBC 16.2 (H) 06/17/2019   HGB 9.2 (L) 06/17/2019   HCT 28.8 (L) 06/17/2019   MCV 90.0 06/17/2019   PLT 559 (H) 06/17/2019   CMP     Component Value Date/Time   NA 136 06/17/2019 1154   K 4.3 06/17/2019 1154   CL 97 (L) 06/17/2019 1154   CO2 24 06/17/2019 1154   GLUCOSE 291 (H) 06/17/2019 1154   BUN 14 06/17/2019 1154   CREATININE 1.02 06/17/2019 1154   CALCIUM 9.5 06/17/2019 1154   PROT 8.4 (H) 09/19/2015 2142   ALBUMIN 5.1 (H) 09/19/2015 2142   AST 22 09/19/2015 2142   ALT 26 09/19/2015 2142   ALKPHOS 101 09/19/2015 2142   BILITOT 1.0 09/19/2015 2142   GFRNONAA >60 06/17/2019 1154   GFRAA >60 06/17/2019 1154      Lab Results  Component Value Date   TSH 0.414 10/01/2011     RADIOGRAPHY: As above, I reviewed his head and neck imaging personally   IMPRESSION/PLAN:  This is a delightful patient with head and neck cancer. I recommend radiotherapy for this patient adjuvantly to the floor of mouth and bilateral neck  We discussed the potential risks, benefits, and side effects of radiotherapy. We talked in detail about acute and late effects. We discussed that some of the most bothersome acute effects may be mucositis, dysgeusia, salivary changes, skin irritation, hair loss, dehydration, weight loss and fatigue. We talked about late effects which  include but are not  necessarily limited to dysphagia, hypothyroidism, nerve injury, spinal cord injury, xerostomia, trismus, and neck edema. No guarantees of treatment were given. A consent form was signed and placed in the patient's medical record. The patient is enthusiastic about proceeding with treatment. I look forward to participating in the patient's care.    Simulation (treatment planning) will take place in about 1 week to allow clearance from dentistry // we will try to turn around the plan as quickly as possible.  We also discussed that the treatment of head and neck cancer is a multidisciplinary process to maximize treatment outcomes and quality of life. For this reason the following referrals have been or will be made:   Dentistry for dental evaluation, possible extractions in the radiation fields, and /or advice on reducing risk of cavities, osteoradionecrosis, or other oral issues.   Nutritionist for nutrition support during and after treatment.  He has a feeding gastrostomy tube.   Speech language pathology for swallowing and speech therapy.   Social work for social support.    Physical therapy due to risk of lymphedema in neck and deconditioning.   Baseline labs including TSH.  The patient has copious secretions.  He apparently has a Yankauer device but does not use it.  Encouraged to use this.  Also encouraged him to use baking soda gargles.  Prescribed fluconazole today for possible thrush.  I applauded him on smoking cessation and alcohol cessation as these are critical to a good outcome  We discussed measures to reduce the risk of infection during the COVID-19 pandemic.  I talked about the COVID-19 vaccine with the patient and his significant other.  I recommended that he receive it.  They asked questions about the vaccine which I answered to the best my ability.  After lengthy discussion, they do not wish to receive it at this time.  On date of service, in total, I spent 60 minutes on  this encounter.  The patient was seen in person. __________________________________________   Eppie Gibson, MD

## 2019-08-08 NOTE — Progress Notes (Signed)
Oncology Nurse Navigator Documentation  Met with patient during initial consult with Dr. Isidore Moos.  He was accompanied by his wife [name] . Further introduced myself as his Navigator, explained my role as a member of the Care Team.   . Provided New Patient Information packet, discussed contents: o Contact information for physician(s), myself, other members of the Care Team. o Advance Directive information ([place] blue pamphlet with LCSW contact info); provided [facility] AD booklet at his request, encouraged him to contact G[place]a [place] LCSW to complete. o Fall Prevention Patient [place] campus map with highlight of [place] o SLP information sheet o Symptom Management Clinic information . Provided introductory explanation of radiation treatment including SIM planning and purpose of Aquaplast head and shoulder mask, showed them example.   . I encouraged them to contact me with questions/concerns as treatments/procedures begin.  They verbalized understanding of information provided.    Harlow Asa, RN, BSN Head & Neck Oncology Nurse [place] at [place] [phone]

## 2019-08-08 NOTE — Progress Notes (Signed)
Dental Form with Estimates of Radiation Dose       Diagnosis:    ICD-10-CM   1. 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 Loss of weight  R63.4   4. Malaise  R53.81 TSH    Prognosis: curative  Anticipated # of fractions: 30    Daily?: yes  # of weeks of radiotherapy: 6  Chemotherapy?: no  Anticipated xerostomia:  Mild permanent   Pre-simulation needs:   Please evaluate the patient, provide dental advice, and clear for CT simulation as soon as possible.  Tentatively we will schedule simulation to take place in 1 week.  He is already 2 months out from surgery.  It is difficult to project his radiation fields because preoperative imaging did not show the cancer.  I will need to review my fields with his surgeon after CT simulation.  Above image is an estimate  Simulation: Cannot wait for dental treatment (unless there is are limited number of highly problematic teeth that can easily be removed right before or right after simulation with minimal delay in start of treatment -please call if this needs to be considered).  Other Notes:  Please contact Eppie Gibson, MD, with patient's disposition after evaluation and/or dental treatment. Thanks!

## 2019-08-09 ENCOUNTER — Encounter: Payer: Self-pay | Admitting: General Practice

## 2019-08-09 ENCOUNTER — Telehealth: Payer: Self-pay | Admitting: Radiation Oncology

## 2019-08-09 NOTE — Progress Notes (Signed)
[facility] Initial Psychosocial Assessment Clinical Social Work  Clinical Social Work contacted by phone to assess psychosocial, emotional, mental health, and spiritual needs of the patient.   Barriers to care/review of distress screen:  - Transportation:  Do you anticipate any problems getting to appointments?  Do you have someone who can help run errands for you if you need it?  Wife is on FMLA and can transport as needed.  Patient uses cane to walk.  He will need wheelchair at entrance to [facility].   - Help at home:  What is your living situation (alone, family, other)?  If you are physically unable to care for yourself, who would you call on to help you?  Lives wife and daughter (69).  Family helps him w all needs at home.  Wife lost her father this winter - mother came to live w them because she is bedridden.  Would like her placed in SNF for rehab/bedbound care on ongoing basis.  Other family lives in [place] so cannot help with home care needed.  Mother has lost use of her legs, per wife she cannot "do anything to help herself."   - Support system:  What does your support system look like?  Who would you call on if you needed some kind of practical help?  What if you needed someone to talk to for emotional support?  No other sources of support.   - Finances:  Are you concerned about finances.  Considering returning to work?  If not, applying for disability?  "Right now we are doing pretty good."  Patient gets "regular disability", wife is not working at this point.  Pt has been on disability since approx 2007.  Used to work at [employer], before that was in [employer] for 10.5 years.  Are working on [place] benefits but "they are putting him through a lot of stuff."  Process is still "pending."  Have applied for Food Stamps, got a minimal amount.    What is your understanding of where you are with your cancer? Its cause?  Your treatment plan and what happens next?   Diagnosed w mouth cancer Jan 2021, was first  treated at [facility].  Mouth felt like it was "swollen", went to ED, had appt at [facility] - was referred to [facility].  Had surgery that "turned out very well", but was 13 hours long.  Currently gets nutrition through tube feeds, will continue "for a while."  Gets supplements through the [place].  Will have about 6 weeks of radiation.    What are your worries for the future as you begin treatment for cancer?  "He is adjusting to what is happening, it is a very big change"  Wife has learned to assist w tube feedings - this was a bit daunting at first, but she has mastered this process at this point.    What are your hopes and priorities during your treatment? What is important to you? What are your goals for your care?  No major concerns at this time other than worry about wifes mother - she is currently inpatient and family is hoping she will discharge to SNF.  Wife does not feel she can take care of bedbound mother and also provide the care patient needs as he undergoes radiation.  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  Was referred to [facility] by his PCP at the [place].  Had surgery, now will have radiation treatment.  Lives w wife and adult daughter.  Mother in law lived with them after death of her husband - family is hoping that she will discharge from inpt to SNF, but thus far she has not been qualified to discharge to SNF.  Wife is primary caregiver - helps w tube feeds, transportation, general support of patient.  She is on FMLA from her job.  Patient has difficulty speaking and wife tends to communicate on his behalf, which is appropriate.    Identifications of barriers to care:  None noted at this time  Availability of community resources:   [employer] and [employer] for potential financial stress due to wife being out of work to care for  patient.    Clinical Social Worker follow up needed: No.  Family has our contact information and are encouraged to reach out as needed.  Edwyna Shell, LCSW Clinical Social Worker Phone:  [phone] Cell:  [phone]

## 2019-08-09 NOTE — Telephone Encounter (Signed)
Scheduled per 5/25 sch message. Spoke with pt wife- aware of appt on 6/7

## 2019-08-09 NOTE — Telephone Encounter (Signed)
Faxed to the [place] our consult dated 5/25 w/a successful return sheet.

## 2019-08-11 ENCOUNTER — Ambulatory Visit (HOSPITAL_COMMUNITY): Payer: Self-pay | Admitting: Dentistry

## 2019-08-11 ENCOUNTER — Other Ambulatory Visit: Payer: Self-pay

## 2019-08-11 ENCOUNTER — Encounter (HOSPITAL_COMMUNITY): Payer: Self-pay | Admitting: Dentistry

## 2019-08-11 VITALS — BP 148/76 | HR 98 | Temp 98.6°F

## 2019-08-11 DIAGNOSIS — K06010 Localized gingival recession, unspecified: Secondary | ICD-10-CM | POA: Diagnosis not present

## 2019-08-11 DIAGNOSIS — E1165 Type 2 diabetes mellitus with hyperglycemia: Secondary | ICD-10-CM | POA: Diagnosis not present

## 2019-08-11 DIAGNOSIS — Z01818 Encounter for other preprocedural examination: Secondary | ICD-10-CM

## 2019-08-11 DIAGNOSIS — K08409 Partial loss of teeth, unspecified cause, unspecified class: Secondary | ICD-10-CM | POA: Diagnosis not present

## 2019-08-11 DIAGNOSIS — F40232 Fear of other medical care: Secondary | ICD-10-CM

## 2019-08-11 DIAGNOSIS — R000 Tachycardia, unspecified: Secondary | ICD-10-CM | POA: Diagnosis not present

## 2019-08-11 DIAGNOSIS — M264 Malocclusion, unspecified: Secondary | ICD-10-CM

## 2019-08-11 DIAGNOSIS — K0530 Chronic periodontitis, unspecified: Secondary | ICD-10-CM

## 2019-08-11 DIAGNOSIS — Z9189 Other specified personal risk factors, not elsewhere classified: Secondary | ICD-10-CM

## 2019-08-11 DIAGNOSIS — K030 Excessive attrition of teeth: Secondary | ICD-10-CM

## 2019-08-11 DIAGNOSIS — K036 Deposits [accretions] on teeth: Secondary | ICD-10-CM | POA: Diagnosis not present

## 2019-08-11 DIAGNOSIS — R252 Cramp and spasm: Secondary | ICD-10-CM

## 2019-08-11 DIAGNOSIS — C040 Malignant neoplasm of anterior floor of mouth: Secondary | ICD-10-CM

## 2019-08-11 MED ORDER — SODIUM FLUORIDE 1.1 % DT CREA: TOPICAL_CREAM | DENTAL | 99 refills | Status: DC

## 2019-08-11 MED ORDER — SODIUM FLUORIDE 1.1 % DT CREA: TOPICAL_CREAM | DENTAL | 99 refills | Status: AC

## 2019-08-11 NOTE — Patient Instructions (Signed)
COVID-19 Education: The signs and symptoms of COVID-19 were discussed with the patient and how to seek care for testing (follow up with PCP or arrange E-visit).   The importance of social distancing was discussed today.  COVID-19 Vaccine Information can be found at: [email or web address] For questions related to vaccine distribution or appointments, please email [email or web address] or call [phone].    RADIATION THERAPY AND DECISIONS REGARDING YOUR TEETH  Xerostomia (dry mouth) Your salivary glands may be in the filed of radiation.  Radiation may include all or part of your saliva glands.  This will cause your saliva to dry up and you will have a dry mouth.  The dry mouth will be for the rest of your life unless your radiation oncologist tells you otherwise.  Your saliva has many functions:  Saliva wets your tongue for speaking.  It coats your teeth and the inside of your mouth for easier movement.  It helps with chewing and swallowing food.  It helps clean away harmful acid and toxic products made by the germs in your mouth, therefore it helps prevent cavities.  It kills some germs in your mouth and helps to prevent gum disease.  It helps to carry flavor to your taste buds.  Once you have lost your saliva you will be at higher risk for tooth decay and gum disease.  What can be done to help improve your mouth when there's not enough saliva:  1.  Your dentist may give a prescription for Salagen.  It will not bring back all of your saliva but may bring back some of it.  Also your saliva may be thick and ropy or white and foamy. It will not feel like it use to feel.  2.  You will need to swish with water every time your mouth feels dry.  YOU CANNOT suck on any cough drops, mints, lemon drops, candy, vitamin C or any other products.  You cannot use anything other than water to make your mouth feel less dry.  If you want to drink  anything else you have to drink it all at once and brush afterwards.  Be sure to discuss the details of your diet habits with your dentist or hygienist.  Radiation caries: This is decay that happens very quickly once your mouth is very dry due to radiation therapy.  Normally cavities take six months to two years to become a problem.  When you have dry mouth cavities may take as little as eight weeks to cause you a problem.  This is why dental check ups every two months are necessary as long as you have a dry mouth. Radiation caries typically, but not always, start at your gum line where it is hard to see the cavity.  It is therefore also hard to fill these cavities adequately.  This high rate of cavities happens because your mouth no longer has saliva and therefore the acid made by the germs starts the decay process.  Whenever you eat anything the germs in your mouth change the food into acid.  The acid then burns a small hole in your tooth.  This small hole is the beginning of a cavity.  If this is not treated then it will grow bigger and become a cavity.  The way to avoid this hole getting bigger is to use fluoride every evening as prescribed by your dentist.  You have to make sure that your teeth are very clean before you use the fluoride.  This fluoride in   turn will strengthen your teeth and prepare them for another day of fighting acid.  If you develop radiation caries many times the damage is so large that you will have to have all your teeth removed.  This could be a big problem if some of these teeth are in the field of radiation.  Further details of why this could be a big problem will follow.  (See Osteoradionecrosis).  Loss of taste (dysgeusia) This happens to varying degrees once you've had radiation therapy to your jaw region.  Many times taste is not completely lost but becomes limited.  The loss of taste is mostly due to radiation affecting your taste buds.  However if you have no saliva in  your mouth to carry the flavor to your taste buds it would be difficult for your taste buds to taste anything.  That is why using water or a prescription for Salagen prior to meals and during meals may help with some of the taste.  Keep in mind that taste generally returns very slowly over the course of several months or several years after radiation therapy.  Don't give up hope.  Trismus According to your Radiation Oncologist your TMJ or jaw joints are going to be partially or fully in the field of radiation.  This means that over time the muscles that help you open and close your mouth may get stiff.  This will potentially result in your not being able to open your mouth wide enough or as wide as you can open it now.  Le me give you an example of how slowly this happens and how unaware people are of it.  A gentlemen that had radiation therapy two years ago came back to me complaining that bananas are just too large for him to be able to fit them in between his teeth.  He was not able to open wide enough to bite into a banana.  This happens slowly and over a period of time.  What do we do to try and prevent this?  Your dentist will probably give you a stack of sticks called a trismus exercise device .  This stack will help your remind your muscles and your jaw joint to open up to the same distance every day.  Use these sticks every morning when you wake up according to the instructions given by the dentist.   You must use these sticks for at least one to two years after radiation therapy.  The reason for that is because it happens so slowly and keeps going on for about two years after radiation therapy.  Your hospital dentist will help you monitor your mouth opening and make sure that it's not getting smaller.  Osteoradionecrosis (ORN) This is a condition where your jaw bone after having had radiation therapy becomes very dry.  It has very little blood supply to keep it alive.  If you develop a cavity that  turns into an abscess or an infection then the jaw bone does not have enough blood supply to help fight the infection.  At this point it is very likely that the infection could cause the death of your jaw bone.  When you have dead bone it has to be removed.  Therefore you might end up having to have surgery to remove part of your jaw bone, the part of the jaw bone that has been affected.   Healing is also a problem if you are to have surgery in the areas where the   bone has had radiation therapy.  The same reasons apply.  If you have surgery you need more blood supply which is not available.  When blood supply and oxygen are not available again, there is a chance for the bone to die.  Occasionally ORN happens on its own with no obvious reason.  This is quite rare.  We believe that patients who continue to smoke and/or drink alcohol have a higher chance of having this bone problem.  Therefore once your jaw bone has had radiation therapy if there are any teeth in that area, you should never have them pulled.  You should also never have any surgery on your teeth or gums in that area unless the oral surgeon or Periodontist is aware of your history of radiation. There is some expensive management techniques that might be used to limit your risks.  The risks for ORN either from infection or spontaneous ( or on it's own) are life long.    TRISMUS  Trismus is a condition where the jaw does not allow the mouth to open as wide as it usually does.  This can happen almost suddenly, or in other cases the process is so slow, it is hard to notice it-until it is too far along.  When the jaw joints and/or muscles have been exposed to radiation treatments, the onset of Trismus is very slow.  This is because the muscles are losing their stretching ability over a long period of time, as long as 2 YEARS after the end of radiation.  It is therefore important to exercise these muscles and joints.  TRISMUS EXERCISES   Stack of  tongue depressors measuring the same or a little less than the last documented MIO (Maximum Interincisal Opening).  Secure them with a rubber band on both ends.  Place the stack in the patient's mouth, supporting the other end.  Allow 30 seconds for muscle stretching.  Rest for a few seconds.  Repeat 3-5 times  For all radiation patients, this exercise is recommended in the mornings and evenings unless otherwise instructed.  The exercise should be done for a period of 2 YEARS after the end of radiation.  MIO should be checked routinely on recall dental visits by the general dentist or the hospital dentist.  The patient is advised to report any changes, soreness, or difficulties encountered when doing the exercises.  

## 2019-08-11 NOTE — Progress Notes (Signed)
DENTAL CONSULTATION  Date of Consultation:  08/11/2019 Patient Name:   [name] Date of Birth:   03-31-1960 Medical Record Number: [id number]  COVID 19 SCREENING: The patient does not symptoms concerning for COVID-19 infection (Including fever, chills, cough, or new SHORTNESS OF BREATH).    VITALS: BP (!) 148/76 (BP Location: Right Arm)   Pulse 98   Temp 98.6 F (37 C)   CHIEF COMPLAINT: Patient referred by Dr. Isidore Moos for a dental consultation.  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  Patient underwent surgical resection of the cancer, partial mandibulectomy with mandible reconstruction and bone plating, bilateral neck dissection, tracheostomy, and free flap reconstruction with Dr. Hendricks Limes at [facility] in [place], [place] on 06/08/2019.  Patient with anticipated postoperative radiation therapy with Dr. Isidore Moos.  Patient is now seen as part of a medically necessary preradiation therapy dental protocol examination  The patient currently denies acute toothaches, swellings, or abscesses.  Patient was last seen by his primary dentist, Dr. Doristine Church approximately 3 to 4 years ago.  Patient had a dental cleaning and multiple extractions at that time.  There were no complications with the dental extractions.  Patient denies having any history of partial dentures.  Patient does have a history of dental phobia.  PROBLEM LIST: Patient Active Problem List   Diagnosis Date Noted  . 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 ([place]) 08/08/2019  . Schizophrenia spectrum disorder with psychotic disorder type not yet determined ([place]) 09/24/2015  . Intermittent explosive disorder 09/20/2015  . Severe recurrent major depressive disorder with psychotic features ([place]) 09/20/2015  . Suicidal ideation 09/20/2015  . Homicidal ideation 09/20/2015  . Chest pain at rest 03/19/2013  . Acute non Q wave MI  (myocardial infarction), initial episode of care ([place]) 03/06/2013  . Chronic sciatica of left side 09/10/2012  . Personality disorder ([place]) 11/10/2011  . Alcohol use disorder, mild, abuse 10/01/2011  . NSTEMI (non-ST elevated myocardial infarction) ([place]) 10/01/2011  . Erectile dysfunction 08/01/2010  . Urge incontinence of urine 08/01/2010  . Chronic alcoholic pancreatitis ([place]) 07/24/2010  . Tobacco use disorder 07/24/2010  . Hypertension 07/24/2010  . Hyperlipidemia 07/24/2010  . CVA (cerebral vascular accident) ([place]) 07/24/2010  . Chronic back pain 07/24/2010    PMH: Past Medical History:  Diagnosis Date  . Anxiety   . Cancer ([place])    mouth  . Chronic pancreatitis ([place])   . Coronary artery disease   . Depression   . Diabetes mellitus without complication ([place])   . Hyperlipidemia   . Hypertension   . Myocardial infarction ([place])   . Neuromuscular disorder ([place])   . Sciatica   . Seizures ([place])   . Stroke ([place])   . Substance abuse ([place])     PSH: Past Surgical History:  Procedure Laterality Date  . BACK SURGERY    . CORONARY STENT PLACEMENT    . GASTROSTOMY TUBE PLACEMENT Left 07/31/2019   [facility]  . LEFT HEART CATHETERIZATION WITH CORONARY ANGIOGRAM N/A 10/01/2011   Procedure: LEFT HEART CATHETERIZATION WITH CORONARY ANGIOGRAM;  Surgeon: Clent Demark, MD;  Location: [facility] CATH LAB;  Service: Cardiovascular;  Laterality: N/A;  . LEFT HEART CATHETERIZATION WITH CORONARY ANGIOGRAM N/A 03/06/2013   Procedure: LEFT HEART CATHETERIZATION WITH CORONARY ANGIOGRAM;  Surgeon: Clent Demark, MD;  Location: [facility] CATH LAB;  Service: Cardiovascular;  Laterality: N/A;  . PERCUTANEOUS CORONARY STENT INTERVENTION (PCI-S) N/A 10/02/2011  Procedure: PERCUTANEOUS CORONARY STENT INTERVENTION (PCI-S);  Surgeon: Clent Demark, MD;  Location: [facility] CATH LAB;  Service: Cardiovascular;  Laterality: N/A;  . TRACHEOSTOMY CLOSURE      ALLERGIES: Allergies  Allergen Reactions  . Morphine And  Related Itching    MEDICATIONS: Current Outpatient Medications  Medication Sig Dispense Refill  . aspirin EC 81 MG EC tablet Take 1 tablet (81 mg total) by mouth daily. (Patient taking differently: Take 81 mg by mouth 3 (three) times a week. ) 30 tablet 3  . atorvastatin (LIPITOR) 80 MG tablet Take 80 mg by mouth daily.    . cloNIDine (CATAPRES) 0.2 MG tablet Take 0.2 mg by mouth daily.    . clopidogrel (PLAVIX) 75 MG tablet Take 75 mg by mouth daily.    . fluconazole (DIFLUCAN) 100 MG tablet Take 2 tablets today, then 1 tablet daily x 6 more days. 8 tablet 0  . glipiZIDE (GLUCOTROL) 5 MG tablet Take 5 mg by mouth 2 (two) times daily before a meal.    . lisinopril (ZESTRIL) 20 MG tablet Take 20 mg by mouth daily.    . magnesium oxide (MAG-OX) 400 MG tablet Take 400 mg by mouth in the morning and at bedtime.    . metoprolol tartrate (LOPRESSOR) 50 MG tablet Take 50 mg by mouth in the morning and at bedtime.    . phenytoin (DILANTIN) 125 MG/5ML suspension Take 100 mg by mouth 3 (three) times daily.    Marland Kitchen valproic acid (DEPAKENE) 250 MG/5ML solution Take 500 mg by mouth in the morning and at bedtime.    Marland Kitchen amoxicillin (AMOXIL) 500 MG capsule Take 2 capsules (1,000 mg total) by mouth 2 (two) times daily. (Patient not taking: Reported on 08/08/2019) 40 capsule 0  . gabapentin (NEURONTIN) 300 MG capsule Take 300 mg by mouth daily.     . hydrOXYzine (ATARAX/VISTARIL) 25 MG tablet Take 25 mg by mouth as needed.    . mirtazapine (REMERON) 15 MG tablet Take 1 tablet (15 mg total) by mouth at bedtime. (Patient not taking: Reported on 08/08/2019) 30 tablet 1  . OXcarbazepine (TRILEPTAL) 300 MG tablet Take 1 tablet (300 mg total) by mouth 2 (two) times daily. (Patient not taking: Reported on 08/08/2019) 60 tablet 1  . pantoprazole (PROTONIX) 20 MG tablet Take 1 tablet (20 mg total) by mouth daily. (Patient not taking: Reported on 08/08/2019) 30 tablet 0  . pantoprazole sodium (PROTONIX) 40 mg/20 mL PACK Give 40  mg by tube daily.    . predniSONE (DELTASONE) 20 MG tablet Take 2 times a day for 5 days then one a day until gone (Patient not taking: Reported on 08/08/2019) 12 tablet 0  . risperiDONE (RISPERDAL) 3 MG tablet Take 1 tablet (3 mg total) by mouth 2 (two) times daily. (Patient not taking: Reported on 08/08/2019) 60 tablet 1  . ticagrelor (BRILINTA) 90 MG TABS tablet Take 1 tablet by mouth 2 (two) times daily.    Marland Kitchen tiZANidine (ZANAFLEX) 4 MG tablet Take 1-2 tablets (4-8 mg total) by mouth every 6 (six) hours as needed for muscle spasms. (Patient not taking: Reported on 08/08/2019) 21 tablet 0  . zolpidem (AMBIEN) 5 MG tablet Take 1 tablet (5 mg total) by mouth at bedtime as needed for sleep. (Patient not taking: Reported on 08/08/2019) 30 tablet 0   No current facility-administered medications for this visit.    LABS: Lab Results  Component Value Date   WBC 16.2 (H) 06/17/2019   HGB 9.2 (  L) 06/17/2019   HCT 28.8 (L) 06/17/2019   MCV 90.0 06/17/2019   PLT 559 (H) 06/17/2019      Component Value Date/Time   NA 136 06/17/2019 1154   K 4.3 06/17/2019 1154   CL 97 (L) 06/17/2019 1154   CO2 24 06/17/2019 1154   GLUCOSE 291 (H) 06/17/2019 1154   BUN 14 06/17/2019 1154   CREATININE 1.02 06/17/2019 1154   CALCIUM 9.5 06/17/2019 1154   GFRNONAA >60 06/17/2019 1154   GFRAA >60 06/17/2019 1154   Lab Results  Component Value Date   INR 1.10 03/20/2013   INR 1.62 (H) 03/06/2013   INR 1.06 07/29/2012   No results found for: PTT  SOCIAL HISTORY: Social History   Socioeconomic History  . Marital status: Married    Spouse name: Not on file  . Number of children: Not on file  . Years of education: Not on file  . Highest education level: Not on file  Occupational History  . Not on file  Tobacco Use  . Smoking status: Former Smoker    Packs/day: 1.00    Years: 40.00    Pack years: 40.00    Types: Cigarettes  . Smokeless tobacco: Never Used  Substance and Sexual Activity  . Alcohol  use: Not Currently  . Drug use: Never  . Sexual activity: Not on file  Other Topics Concern  . Not on file  Social History Narrative   The patient lives alone with his significant other, [name].  The patient has a 102 year old daughter.  He is currently unemployed, and  has a high school degree.        He has a history of 1- 1.5 pack per day smoking of many years.  He states he has recently cut down to half pack per day (06/2010).      He has a history of heavy alcohol use, states typically drinking a fifth to a pint of gin daily, after stroke in 06/2010, decreased to one pint of gin per week, and after hospitalization for acute on chronic pancreatitis, was abstinent for at least 1-2 weeks (from 5/7 to 5/17)        He denies any illicit drug use.    Social Determinants of Health   Financial Resource Strain:   . Difficulty of Paying Living Expenses:   Food Insecurity:   . Worried About Charity fundraiser in the Last Year:   . Arboriculturist in the Last Year:   Transportation Needs:   . Film/video editor (Medical):   Marland Kitchen Lack of Transportation (Non-Medical):   Physical Activity:   . Days of Exercise per Week:   . Minutes of Exercise per Session:   Stress:   . Feeling of Stress :   Social Connections:   . Frequency of Communication with Friends and Family:   . Frequency of Social Gatherings with Friends and Family:   . Attends Religious Services:   . Active Member of Clubs or Organizations:   . Attends Archivist Meetings:   Marland Kitchen Marital Status:   Intimate Partner Violence:   . Fear of Current or Ex-Partner:   . Emotionally Abused:   Marland Kitchen Physically Abused:   . Sexually Abused:     FAMILY HISTORY: Family History  Family history unknown: Yes    REVIEW OF SYSTEMS: Reviewed with the patient as per History of present illness. Psych: Patient does have a history of anxiety, depression and dental phobia.  DENTAL HISTORY: CHIEF COMPLAINT: Patient referred by Dr.  Isidore Moos for a dental consultation.  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  Patient underwent surgical resection of the cancer, partial mandibulectomy with mandible reconstruction and bone plating, bilateral neck dissection, tracheostomy, and free flap reconstruction with Dr. Hendricks Limes at [facility] in [place], [place] on 06/08/2019.  Patient with anticipated postoperative radiation therapy with Dr. Isidore Moos.  Patient is now seen as part of a medically necessary preradiation therapy dental protocol examination  The patient currently denies acute toothaches, swellings, or abscesses.  Patient was last seen by his primary dentist, Dr. Doristine Church approximately 3 to 4 years ago.  Patient had a dental cleaning and multiple extractions at that time.  There were no complications with the dental extractions.  Patient denies having any history of partial dentures.  Patient does have a history of dental phobia.   DENTAL EXAMINATION: GENERAL: The patient is well-developed, well-nourished male no acute distress.  Patient currently walks with the aid of a cane. HEAD AND NECK: Patient has a history of bilateral neck dissections.  Patient has a decreased maximum interincisal opening with trismus. INTRAORAL EXAM: Patient has excessive saliva.  Patient appears to have oral candidiasis and is currently being treated with Diflucan.  There is no evidence of oral abscess formation. DENTITION: Patient is missing tooth numbers 1, 14, 20 - 30, and 32.  There is evidence of maxillary anterior incisal attrition.   PERIODONTAL: Patient has chronic periodontitis with plaque and calculus accumulations, gingival recession, and incipient a moderate bone loss.  No significant tooth mobility is noted at this time. DENTAL CARIES/SUBOPTIMAL RESTORATIONS: No dental caries are noted at this time. ENDODONTIC: The patient currently denies acute pulpitis  symptoms.  I do not see any evidence of periapical pathology or radiolucency. CROWN AND BRIDGE: There are no crown or bridge restorations PROSTHODONTIC: Patient denies having a history of partial dentures. OCCLUSION: Patient has a poor occlusal scheme secondary to multiple missing teeth that were removed as part of the surgical resection of the cancer.  Patient does have the maxillary and mandibular molars that are in occlusion.  RADIOGRAPHIC INTERPRETATION: Orthopantogram was taken today. There are multiple missing teeth.  There is a mandibulectomy in the area of #20 through 30.  There is presence of a bone graft and mandibular plating in the area of #17 through 32.  There are multiple surgical clips present from previous head neck surgery.  There is no evidence of periapical pathology.  There is incipient a moderate bone loss noted.   ASSESSMENTS: 1.  Squamous cell carcinoma of the anterior floor of mouth 2.  Status post surgical resection and reconstruction 3.  Preradiation therapy dental protocol 4.  Chronic periodontitis with bone loss 5.  Gingival recession 6.  Accretions 7.  Maxillary anterior incisal attrition 8.   Multiple missing teeth 9.  Poor occlusal scheme and malocclusion 10.  Dental phobia 11.  Trismus 12.  Risk for bleeding with invasive dental procedures due to current Plavix therapy.  PLAN/RECOMMENDATIONS: 1. I discussed the risks, benefits, and complications of various treatment options with the patient in relationship to his medical and dental conditions, anticipated radiation therapy, and radiation therapy side effects to include xerostomia, radiation caries, trismus, mucositis, taste changes, gum and jawbone changes, and risk for infection and osteoradionecrosis.. We discussed various treatment options to include no treatment, extraction of teeth in the primary field of radiation  therapy, periodontal therapy, dental restorations, root canal therapy, crown and bridge  therapy, implant therapy, and replacement of missing teeth as indicated.  No extractions will be done at this time due to the need to start postoperative radiation therapy as soon as possible per Dr. Isidore Moos.  Dr. Isidore Moos will attempt to keep the dose of radiation therapy below 5000 cGy to the remaining maxillary and mandibular teeth.  A prescription for Prevident 5000 fluoride therapy was provided to the patient today with refills for 1 year to use at bedtime using a brush on technique.  Patient most likely will require the services of a prosthodontist for future dental management.   2. Discussion of findings with medical team and coordination of future medical and dental care as needed.  Patient is currently cleared for radiation therapy at this time.  I spent in excess of  120 minutes during the conduct of this consultation and >50% of this time involved direct face-to-face encounter for counseling and/or coordination of the patient's care.    Lenn Cal, DDS

## 2019-08-15 ENCOUNTER — Encounter (HOSPITAL_COMMUNITY): Payer: Self-pay | Admitting: Internal Medicine

## 2019-08-15 ENCOUNTER — Other Ambulatory Visit: Payer: Self-pay

## 2019-08-15 ENCOUNTER — Ambulatory Visit
Admission: RE | Admit: 2019-08-15 | Discharge: 2019-08-15 | Disposition: A | Discharge status: Home / Self Care | Payer: Medicare PPO | Source: Ambulatory Visit | Attending: Radiation Oncology | Admitting: Radiation Oncology

## 2019-08-15 ENCOUNTER — Telehealth: Payer: Self-pay

## 2019-08-15 ENCOUNTER — Inpatient Hospital Stay (HOSPITAL_COMMUNITY)
Admission: AD | Admit: 2019-08-15 | Discharge: 2019-08-17 | DRG: 682 | Disposition: A | Discharge status: Home / Self Care | Payer: Medicare PPO | Source: Ambulatory Visit | Attending: Internal Medicine | Admitting: Internal Medicine

## 2019-08-15 ENCOUNTER — Ambulatory Visit
Admission: RE | Admit: 2019-08-15 | Discharge: 2019-08-15 | Disposition: A | Discharge status: Home / Self Care | Payer: No Typology Code available for payment source | Source: Ambulatory Visit | Attending: Radiation Oncology | Admitting: Radiation Oncology

## 2019-08-15 VITALS — BP 87/65 | HR 103 | Temp 97.1°F | Resp 18 | Wt 126.1 lb

## 2019-08-15 DIAGNOSIS — N179 Acute kidney failure, unspecified: Principal | ICD-10-CM

## 2019-08-15 DIAGNOSIS — E440 Moderate protein-calorie malnutrition: Secondary | ICD-10-CM | POA: Insufficient documentation

## 2019-08-15 DIAGNOSIS — Z885 Allergy status to narcotic agent status: Secondary | ICD-10-CM

## 2019-08-15 DIAGNOSIS — E1169 Type 2 diabetes mellitus with other specified complication: Secondary | ICD-10-CM | POA: Diagnosis not present

## 2019-08-15 DIAGNOSIS — E43 Unspecified severe protein-calorie malnutrition: Secondary | ICD-10-CM | POA: Diagnosis not present

## 2019-08-15 DIAGNOSIS — Z87891 Personal history of nicotine dependence: Secondary | ICD-10-CM

## 2019-08-15 DIAGNOSIS — Z66 Do not resuscitate: Secondary | ICD-10-CM | POA: Diagnosis present

## 2019-08-15 DIAGNOSIS — Z8674 Personal history of sudden cardiac arrest: Secondary | ICD-10-CM

## 2019-08-15 DIAGNOSIS — Z79899 Other long term (current) drug therapy: Secondary | ICD-10-CM

## 2019-08-15 DIAGNOSIS — I252 Old myocardial infarction: Secondary | ICD-10-CM

## 2019-08-15 DIAGNOSIS — C040 Malignant neoplasm of anterior floor of mouth: Secondary | ICD-10-CM | POA: Diagnosis present

## 2019-08-15 DIAGNOSIS — Z681 Body mass index (BMI) 19 or less, adult: Secondary | ICD-10-CM | POA: Diagnosis not present

## 2019-08-15 DIAGNOSIS — F333 Major depressive disorder, recurrent, severe with psychotic symptoms: Secondary | ICD-10-CM | POA: Diagnosis not present

## 2019-08-15 DIAGNOSIS — R569 Unspecified convulsions: Secondary | ICD-10-CM | POA: Diagnosis present

## 2019-08-15 DIAGNOSIS — R6510 Systemic inflammatory response syndrome (SIRS) of non-infectious origin without acute organ dysfunction: Secondary | ICD-10-CM | POA: Diagnosis not present

## 2019-08-15 DIAGNOSIS — Z931 Gastrostomy status: Secondary | ICD-10-CM | POA: Diagnosis not present

## 2019-08-15 DIAGNOSIS — R739 Hyperglycemia, unspecified: Secondary | ICD-10-CM | POA: Diagnosis not present

## 2019-08-15 DIAGNOSIS — E875 Hyperkalemia: Secondary | ICD-10-CM | POA: Diagnosis not present

## 2019-08-15 DIAGNOSIS — R627 Adult failure to thrive: Secondary | ICD-10-CM | POA: Diagnosis present

## 2019-08-15 DIAGNOSIS — C069 Malignant neoplasm of mouth, unspecified: Secondary | ICD-10-CM

## 2019-08-15 DIAGNOSIS — Z7902 Long term (current) use of antithrombotics/antiplatelets: Secondary | ICD-10-CM

## 2019-08-15 DIAGNOSIS — R000 Tachycardia, unspecified: Secondary | ICD-10-CM | POA: Diagnosis present

## 2019-08-15 DIAGNOSIS — I10 Essential (primary) hypertension: Secondary | ICD-10-CM | POA: Diagnosis present

## 2019-08-15 DIAGNOSIS — E1165 Type 2 diabetes mellitus with hyperglycemia: Secondary | ICD-10-CM | POA: Diagnosis not present

## 2019-08-15 DIAGNOSIS — Z8673 Personal history of transient ischemic attack (TIA), and cerebral infarction without residual deficits: Secondary | ICD-10-CM | POA: Diagnosis not present

## 2019-08-15 DIAGNOSIS — Z7982 Long term (current) use of aspirin: Secondary | ICD-10-CM

## 2019-08-15 DIAGNOSIS — I2510 Atherosclerotic heart disease of native coronary artery without angina pectoris: Secondary | ICD-10-CM | POA: Diagnosis present

## 2019-08-15 DIAGNOSIS — R6511 Systemic inflammatory response syndrome (SIRS) of non-infectious origin with acute organ dysfunction: Secondary | ICD-10-CM | POA: Diagnosis not present

## 2019-08-15 DIAGNOSIS — D72829 Elevated white blood cell count, unspecified: Secondary | ICD-10-CM | POA: Diagnosis present

## 2019-08-15 DIAGNOSIS — Z7984 Long term (current) use of oral hypoglycemic drugs: Secondary | ICD-10-CM

## 2019-08-15 DIAGNOSIS — Z955 Presence of coronary angioplasty implant and graft: Secondary | ICD-10-CM

## 2019-08-15 DIAGNOSIS — B370 Candidal stomatitis: Secondary | ICD-10-CM | POA: Diagnosis not present

## 2019-08-15 DIAGNOSIS — E785 Hyperlipidemia, unspecified: Secondary | ICD-10-CM | POA: Diagnosis present

## 2019-08-15 DIAGNOSIS — E878 Other disorders of electrolyte and fluid balance, not elsewhere classified: Secondary | ICD-10-CM | POA: Diagnosis not present

## 2019-08-15 LAB — CMP (CANCER CENTER ONLY)
ALT: 33 U/L (ref 0–44)
AST: 23 U/L (ref 15–41)
Albumin: 3.4 g/dL — ABNORMAL LOW (ref 3.5–5.0)
Alkaline Phosphatase: 163 U/L — ABNORMAL HIGH (ref 38–126)
Anion gap: 16 — ABNORMAL HIGH (ref 5–15)
BUN: 33 mg/dL — ABNORMAL HIGH (ref 6–20)
CO2: 23 mmol/L (ref 22–32)
Calcium: 10.4 mg/dL — ABNORMAL HIGH (ref 8.9–10.3)
Chloride: 89 mmol/L — ABNORMAL LOW (ref 98–111)
Creatinine: 1.25 mg/dL — ABNORMAL HIGH (ref 0.61–1.24)
GFR, Est AFR Am: >60 mL/min (ref >60)
GFR, Estimated: >60 mL/min (ref >60)
Glucose, Bld: 520 mg/dL — ABNORMAL HIGH (ref 70–99)
Potassium: 6.1 mmol/L (ref 3.5–5.1)
Sodium: 128 mmol/L — ABNORMAL LOW (ref 135–145)
Total Bilirubin: <0.2 mg/dL — ABNORMAL LOW (ref 0.3–1.2)
Total Protein: 8.7 g/dL — ABNORMAL HIGH (ref 6.5–8.1)

## 2019-08-15 LAB — CBC
HCT: 32.2 % — ABNORMAL LOW (ref 39.0–52.0)
Hemoglobin: 10.3 g/dL — ABNORMAL LOW (ref 13.0–17.0)
MCH: 28.2 pg (ref 26.0–34.0)
MCHC: 32 g/dL (ref 30.0–36.0)
MCV: 88.2 fL (ref 80.0–100.0)
Platelets: 415 10*3/uL — ABNORMAL HIGH (ref 150–400)
RBC: 3.65 MIL/uL — ABNORMAL LOW (ref 4.22–5.81)
RDW: 14.2 % (ref 11.5–15.5)
WBC: 14.8 10*3/uL — ABNORMAL HIGH (ref 4.0–10.5)
nRBC: 0 % (ref 0.0–0.2)

## 2019-08-15 LAB — CREATININE, SERUM
Creatinine, Ser: 1.22 mg/dL (ref 0.61–1.24)
GFR calc Af Amer: >60 mL/min (ref >60)
GFR calc non Af Amer: >60 mL/min (ref >60)

## 2019-08-15 LAB — HEMOGLOBIN A1C
Hgb A1c MFr Bld: 9.8 % — ABNORMAL HIGH (ref 4.8–5.6)
Mean Plasma Glucose: 234.56 mg/dL

## 2019-08-15 LAB — CBC WITH DIFFERENTIAL (CANCER CENTER ONLY)
Abs Immature Granulocytes: 0.08 10*3/uL — ABNORMAL HIGH (ref 0.00–0.07)
Basophils Absolute: 0.1 10*3/uL (ref 0.0–0.1)
Basophils Relative: 1 %
Eosinophils Absolute: 0.2 10*3/uL (ref 0.0–0.5)
Eosinophils Relative: 1 %
HCT: 31.4 % — ABNORMAL LOW (ref 39.0–52.0)
Hemoglobin: 10 g/dL — ABNORMAL LOW (ref 13.0–17.0)
Immature Granulocytes: 1 %
Lymphocytes Relative: 18 %
Lymphs Abs: 2.6 10*3/uL (ref 0.7–4.0)
MCH: 27.2 pg (ref 26.0–34.0)
MCHC: 31.8 g/dL (ref 30.0–36.0)
MCV: 85.3 fL (ref 80.0–100.0)
Monocytes Absolute: 0.9 10*3/uL (ref 0.1–1.0)
Monocytes Relative: 6 %
Neutro Abs: 10.5 10*3/uL — ABNORMAL HIGH (ref 1.7–7.7)
Neutrophils Relative %: 73 %
Platelet Count: 406 10*3/uL — ABNORMAL HIGH (ref 150–400)
RBC: 3.68 MIL/uL — ABNORMAL LOW (ref 4.22–5.81)
RDW: 13.9 % (ref 11.5–15.5)
WBC Count: 14.3 10*3/uL — ABNORMAL HIGH (ref 4.0–10.5)
nRBC: 0 % (ref 0.0–0.2)

## 2019-08-15 LAB — MAGNESIUM: Magnesium: 2.1 mg/dL (ref 1.7–2.4)

## 2019-08-15 LAB — BASIC METABOLIC PANEL
Anion gap: 15 (ref 5–15)
BUN: 42 mg/dL — ABNORMAL HIGH (ref 6–20)
CO2: 25 mmol/L (ref 22–32)
Calcium: 10.1 mg/dL (ref 8.9–10.3)
Chloride: 91 mmol/L — ABNORMAL LOW (ref 98–111)
Creatinine, Ser: 1.19 mg/dL (ref 0.61–1.24)
GFR calc Af Amer: >60 mL/min (ref >60)
GFR calc non Af Amer: >60 mL/min (ref >60)
Glucose, Bld: 353 mg/dL — ABNORMAL HIGH (ref 70–99)
Potassium: 5.8 mmol/L — ABNORMAL HIGH (ref 3.5–5.1)
Sodium: 131 mmol/L — ABNORMAL LOW (ref 135–145)

## 2019-08-15 LAB — GLUCOSE, CAPILLARY
Glucose-Capillary: 474 mg/dL — ABNORMAL HIGH (ref 70–99)
Glucose-Capillary: 317 mg/dL — ABNORMAL HIGH (ref 70–99)
Glucose-Capillary: 214 mg/dL — ABNORMAL HIGH (ref 70–99)

## 2019-08-15 LAB — BETA-HYDROXYBUTYRIC ACID: Beta-Hydroxybutyric Acid: 0.28 mmol/L — ABNORMAL HIGH (ref 0.05–0.27)

## 2019-08-15 LAB — TSH: TSH: 1.101 u[IU]/mL (ref 0.350–4.500)

## 2019-08-15 MED ORDER — PHENYTOIN 125 MG/5ML PO SUSP
100.0000 mg | Freq: Three times a day (TID) | ORAL | Status: DC
  Administered 2019-08-15 – 2019-08-17 (×5): 100 mg
  Filled 2019-08-15 (×6): qty 4

## 2019-08-15 MED ORDER — GABAPENTIN 300 MG PO CAPS: 300.0000 mg | ORAL_CAPSULE | Freq: Every day | ORAL | Status: DC

## 2019-08-15 MED ORDER — INSULIN GLARGINE 100 UNIT/ML ~~LOC~~ SOLN
8.0000 [IU] | Freq: Every day | SUBCUTANEOUS | Status: DC
  Filled 2019-08-15: qty 0.08

## 2019-08-15 MED ORDER — INSULIN ASPART 100 UNIT/ML ~~LOC~~ SOLN
5.0000 [IU] | Freq: Once | SUBCUTANEOUS | Status: AC
  Administered 2019-08-15: 5 [IU] via SUBCUTANEOUS

## 2019-08-15 MED ORDER — ENOXAPARIN SODIUM 40 MG/0.4ML ~~LOC~~ SOLN
40.0000 mg | SUBCUTANEOUS | Status: DC
  Administered 2019-08-15 – 2019-08-16 (×2): 40 mg via SUBCUTANEOUS
  Filled 2019-08-15 (×2): qty 0.4

## 2019-08-15 MED ORDER — ATORVASTATIN CALCIUM 40 MG PO TABS: 80.0000 mg | ORAL_TABLET | Freq: Every day | ORAL | Status: DC

## 2019-08-15 MED ORDER — METOPROLOL TARTRATE 5 MG/5ML IV SOLN: 5.0000 mg | Freq: Four times a day (QID) | INTRAVENOUS | Status: DC | PRN

## 2019-08-15 MED ORDER — SODIUM ZIRCONIUM CYCLOSILICATE 10 G PO PACK
10.0000 g | PACK | Freq: Once | ORAL | Status: AC
  Administered 2019-08-16: 10 g via ORAL
  Filled 2019-08-15: qty 1

## 2019-08-15 MED ORDER — INSULIN ASPART 100 UNIT/ML ~~LOC~~ SOLN: 3.0000 [IU] | Freq: Three times a day (TID) | SUBCUTANEOUS | Status: DC

## 2019-08-15 MED ORDER — ACETAMINOPHEN 650 MG RE SUPP: 650.0000 mg | Freq: Four times a day (QID) | RECTAL | Status: DC | PRN

## 2019-08-15 MED ORDER — PANTOPRAZOLE SODIUM 40 MG PO PACK: 40.0000 mg | PACK | Freq: Every day | ORAL | Status: DC

## 2019-08-15 MED ORDER — INSULIN ASPART 100 UNIT/ML ~~LOC~~ SOLN: 10.0000 [IU] | Freq: Once | SUBCUTANEOUS | Status: DC

## 2019-08-15 MED ORDER — ASPIRIN 81 MG PO TBEC
81.0000 mg | DELAYED_RELEASE_TABLET | Freq: Every day | ORAL | Status: DC
  Filled 2019-08-15: qty 1

## 2019-08-15 MED ORDER — INSULIN GLARGINE 100 UNIT/ML ~~LOC~~ SOLN: 8.0000 [IU] | Freq: Every day | SUBCUTANEOUS | Status: DC

## 2019-08-15 MED ORDER — METOPROLOL TARTRATE 50 MG PO TABS: 50.0000 mg | ORAL_TABLET | Freq: Two times a day (BID) | ORAL | Status: DC

## 2019-08-15 MED ORDER — METOPROLOL TARTRATE 25 MG/10 ML ORAL SUSPENSION
50.0000 mg | Freq: Two times a day (BID) | ORAL | Status: DC
  Administered 2019-08-15 – 2019-08-17 (×4): 50 mg
  Filled 2019-08-15 (×4): qty 20

## 2019-08-15 MED ORDER — INSULIN ASPART 100 UNIT/ML ~~LOC~~ SOLN
0.0000 [IU] | Freq: Three times a day (TID) | SUBCUTANEOUS | Status: DC
  Administered 2019-08-16: 2 [IU] via SUBCUTANEOUS
  Administered 2019-08-16 (×2): 3 [IU] via SUBCUTANEOUS

## 2019-08-15 MED ORDER — METOPROLOL TARTRATE 25 MG/10 ML ORAL SUSPENSION
25.0000 mg | Freq: Two times a day (BID) | ORAL | Status: DC
  Filled 2019-08-15: qty 10

## 2019-08-15 MED ORDER — GLUCERNA 1.2 CAL PO LIQD
237.0000 mL | ORAL | Status: DC
  Filled 2019-08-15: qty 237

## 2019-08-15 MED ORDER — CLOPIDOGREL BISULFATE 75 MG PO TABS
75.0000 mg | ORAL_TABLET | Freq: Every day | ORAL | Status: DC
  Administered 2019-08-16 – 2019-08-17 (×2): 75 mg
  Filled 2019-08-15 (×2): qty 1

## 2019-08-15 MED ORDER — GLUCERNA SHAKE PO LIQD
237.0000 mL | ORAL | Status: AC
  Administered 2019-08-15: 237 mL
  Filled 2019-08-15: qty 237

## 2019-08-15 MED ORDER — POLYETHYLENE GLYCOL 3350 17 G PO PACK: 17.0000 g | PACK | Freq: Every day | ORAL | Status: DC | PRN

## 2019-08-15 MED ORDER — ONDANSETRON HCL 4 MG PO TABS: 4.0000 mg | ORAL_TABLET | Freq: Four times a day (QID) | ORAL | Status: DC | PRN

## 2019-08-15 MED ORDER — FLUCONAZOLE 100 MG PO TABS
100.0000 mg | ORAL_TABLET | Freq: Every day | ORAL | Status: AC
  Administered 2019-08-15 – 2019-08-17 (×3): 100 mg
  Filled 2019-08-15 (×4): qty 1

## 2019-08-15 MED ORDER — SODIUM CHLORIDE 0.9 % IV SOLN: INTRAVENOUS | Status: AC

## 2019-08-15 MED ORDER — VALPROIC ACID 250 MG/5ML PO SOLN
500.0000 mg | Freq: Two times a day (BID) | ORAL | Status: DC
  Administered 2019-08-15 – 2019-08-17 (×4): 500 mg
  Filled 2019-08-15 (×4): qty 10

## 2019-08-15 MED ORDER — ONDANSETRON HCL 4 MG/2ML IJ SOLN: 4.0000 mg | Freq: Four times a day (QID) | INTRAMUSCULAR | Status: DC | PRN

## 2019-08-15 MED ORDER — ACETAMINOPHEN 325 MG PO TABS
650.0000 mg | ORAL_TABLET | Freq: Four times a day (QID) | ORAL | Status: DC | PRN
  Administered 2019-08-15: 650 mg
  Filled 2019-08-15: qty 2

## 2019-08-15 NOTE — H&P (Signed)
History and Physical        Hospital Admission Note Date: 08/15/2019  Patient name: [name] Medical record number: [id number] Date of birth: 1960-09-14 Age: 59 y.o. 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 He had a similar recent hospitalization on 08/11/2019 when he was found to be hyponatremic and hyperglycemic and treated with insulin and saline.  History obtained from wife and care everywhere as patient is nonverbal due to prior oral surgeries.  She states that he is been in his typical state of health without any recent complaints or issues other than one episode today of "spitting up."This a.m. he was noted to have generalized malaise on previous note.  Notable labs: Obtained during direct admission: Na 131 (128 this a.m.), K5.8 (6.1 this a.m), Cl 91, glucose 353 (520 this a.m.), BUN 42, creatinine 1.19 (down from 1.25 this a.m.).  WBC 14.3, H/H 10/31, platelets 406 ED treatments: NovoLog 5 units, Lokelma, NaCl infusion ED imaging: None ED vitals:   Vitals:   08/15/19 1646  BP: (!) 153/69  Pulse: (!) 123  Resp: 18  Temp: 98 F (36.7 C)  SpO2: 100%     Review of Systems:  Review of Systems  Reason unable to perform ROS: Difficult to obtain due to nonverbal post head/neck surgery.  Constitutional: Negative.   Respiratory: Negative for shortness of breath.   Cardiovascular: Negative for chest pain and palpitations.  Gastrointestinal: Negative for nausea and vomiting.  Genitourinary: Negative.   Neurological: Negative.     Medical/Social/Family History   Past Medical History: Past Medical History:  Diagnosis Date  . Anxiety   . Cancer ([place])    mouth  . Chronic pancreatitis ([place])   . Coronary artery disease   . Depression   . Diabetes mellitus without complication ([place])   . Hyperlipidemia   . Hypertension   . Myocardial infarction ([place])   . Neuromuscular disorder ([place])   . Sciatica   . Seizures ([place])   . Stroke ([place])   . Substance abuse [facility])     Past Surgical History:  Procedure Laterality Date  . BACK SURGERY    . CORONARY STENT PLACEMENT    . GASTROSTOMY TUBE PLACEMENT Left 07/31/2019   [facility]  . LEFT HEART CATHETERIZATION WITH CORONARY ANGIOGRAM N/A 10/01/2011   Procedure: LEFT HEART CATHETERIZATION WITH CORONARY ANGIOGRAM;  Surgeon: Clent Demark, MD;  Location: [facility] CATH LAB;  Service: Cardiovascular;  Laterality: N/A;  . LEFT  HEART CATHETERIZATION WITH CORONARY ANGIOGRAM N/A 03/06/2013   Procedure: LEFT HEART CATHETERIZATION WITH CORONARY ANGIOGRAM;  Surgeon: Clent Demark, MD;  Location: [facility] CATH LAB;  Service: Cardiovascular;  Laterality: N/A;  . PERCUTANEOUS CORONARY STENT INTERVENTION (PCI-S) N/A 10/02/2011   Procedure: PERCUTANEOUS CORONARY STENT INTERVENTION (PCI-S);  Surgeon: Clent Demark, MD;  Location: [facility] CATH LAB;  Service: Cardiovascular;  Laterality: N/A;  . TRACHEOSTOMY CLOSURE      Medications: Prior to Admission medications   Medication Sig Start Date End Date Taking? Authorizing  Provider  aspirin EC 81 MG EC tablet Take 1 tablet (81 mg total) by mouth daily. 03/08/13  Yes Charolette Forward, MD  amoxicillin (AMOXIL) 500 MG capsule Take 2 capsules (1,000 mg total) by mouth 2 (two) times daily. Patient not taking: Reported on 08/08/2019 05/15/19   Charlann Lange, PA-C  atorvastatin (LIPITOR) 80 MG tablet Take 80 mg by mouth daily.    [provider]  cloNIDine (CATAPRES) 0.2 MG tablet Take 0.2 mg by mouth daily. 08/08/19 08/11/19  [provider]  clopidogrel (PLAVIX) 75 MG tablet Take 75 mg by mouth daily.    [provider]  fluconazole (DIFLUCAN) 100 MG tablet Take 2 tablets today, then 1 tablet daily x 6 more days. 08/08/19   Eppie Gibson, MD  gabapentin (NEURONTIN) 300 MG capsule Take 300 mg by mouth daily.     [provider]  glipiZIDE (GLUCOTROL) 5 MG tablet Take 5 mg by mouth 2 (two) times daily before a meal. 03/24/19   [provider]  hydrOXYzine (ATARAX/VISTARIL) 25 MG tablet Take 25 mg by mouth as needed. 08/04/19 09/03/19  [provider]  lisinopril (ZESTRIL) 20 MG tablet Take 20 mg by mouth daily. 08/04/19   [provider]  metoprolol tartrate (LOPRESSOR) 50 MG tablet Take 50 mg by mouth in the morning and at bedtime. 08/04/19   [provider]  mirtazapine (REMERON) 15 MG tablet Take 1 tablet (15 mg total) by mouth at bedtime. Patient not taking: Reported on 08/08/2019 09/26/15   Pucilowska, Wardell Honour, MD  OXcarbazepine (TRILEPTAL) 300 MG tablet Take 1 tablet (300 mg total) by mouth 2 (two) times daily. Patient not taking: Reported on 08/08/2019 09/26/15   Pucilowska, Herma Ard B, MD  pantoprazole (PROTONIX) 20 MG tablet Take 1 tablet (20 mg total) by mouth daily. Patient not taking: Reported on 08/08/2019 04/24/19   Nuala Alpha A, PA-C  pantoprazole sodium (PROTONIX) 40 mg/20 mL PACK Give 40 mg by tube daily. 08/04/19   [provider]  phenytoin (DILANTIN) 125 MG/5ML suspension Take 100 mg by  mouth 3 (three) times daily. 08/04/19   [provider]  predniSONE (DELTASONE) 20 MG tablet Take 2 times a day for 5 days then one a day until gone Patient not taking: Reported on 08/08/2019 10/31/18   Raylene Everts, MD  risperiDONE (RISPERDAL) 3 MG tablet Take 1 tablet (3 mg total) by mouth 2 (two) times daily. Patient not taking: Reported on 08/08/2019 09/26/15   Pucilowska, Herma Ard B, MD  sodium fluoride (PREVIDENT 5000 PLUS) 1.1 % CREA dental cream Apply thin ribbon of cream to tooth brush. Brush teeth for 2 minutes. Spit out excess-DO NOT swallow. DO NOT rinse afterwards. Repeat nightly. 08/11/19   Lenn Cal, DDS  ticagrelor (BRILINTA) 90 MG TABS tablet Take 1 tablet by mouth 2 (two) times daily.    [provider]  tiZANidine (ZANAFLEX) 4 MG tablet Take 1-2 tablets (4-8 mg total) by mouth every  6 (six) hours as needed for muscle spasms. Patient not taking: Reported on 08/08/2019 10/31/18   Raylene Everts, MD  valproic acid (DEPAKENE) 250 MG/5ML solution Take 500 mg by mouth in the morning and at bedtime. 08/04/19   [provider]  zolpidem (AMBIEN) 5 MG tablet Take 1 tablet (5 mg total) by mouth at bedtime as needed for sleep. Patient not taking: Reported on 08/08/2019 09/26/15   Clovis Fredrickson, MD    Allergies:   Allergies  Allergen Reactions  . Morphine And Related Itching    Social History:  reports that he has quit smoking. His smoking use included cigarettes. He has a 40.00 pack-year smoking history. He has never used smokeless tobacco. He reports previous alcohol use. He reports that he does not use drugs.  Family History: Family History  Family history unknown: Yes     Objective   Physical Exam: Blood pressure (!) 153/69, pulse (!) 123, temperature 98 F (36.7 C), temperature source Oral, resp. rate 18, SpO2 100 %.  Physical Exam Vitals and nursing note reviewed. Exam conducted with a chaperone present.  Constitutional:       Appearance: Normal appearance.  HENT:     Head: Normocephalic.     Comments: Post mandibular resection surgery    Mouth/Throat:     Mouth: Mucous membranes are moist.     Comments: Oral candidiasis Eyes:     Conjunctiva/sclera: Conjunctivae normal.  Neck:     Comments: Post tracheostomy scar Cardiovascular:     Rate and Rhythm: Regular rhythm. Tachycardia present.  Pulmonary:     Effort: Pulmonary effort is normal.     Breath sounds: Normal breath sounds.  Abdominal:     General: Abdomen is flat.     Palpations: Abdomen is soft.  Musculoskeletal:        General: No swelling or tenderness.  Skin:    Coloration: Skin is not jaundiced or pale.  Neurological:     Mental Status: He is alert. Mental status is at baseline.  Psychiatric:        Mood and Affect: Mood normal.        Behavior: Behavior normal.     LABS on Admission: I have personally reviewed all the labs and imaging below    Basic Metabolic Panel: Recent Labs  Lab 08/15/19 1042 08/15/19 1722  NA 128* 131*  K 6.1* 5.8*  CL 89* 91*  CO2 23 25  GLUCOSE 520* 353*  BUN 33* 42*  CREATININE 1.25* 1.19  CALCIUM 10.4* 10.1   Liver Function Tests: Recent Labs  Lab 08/15/19 1042  AST 23  ALT 33  ALKPHOS 163*  BILITOT <0.2*  PROT 8.7*  ALBUMIN 3.4*   No results for input(s): LIPASE, AMYLASE in the last 168 hours. No results for input(s): AMMONIA in the last 168 hours. CBC: Recent Labs  Lab 08/15/19 1042  WBC 14.3*  NEUTROABS 10.5*  HGB 10.0*  HCT 31.4*  MCV 85.3  PLT 406*   Cardiac Enzymes: No results for input(s): CKTOTAL, CKMB, CKMBINDEX, TROPONINI in the last 168 hours. BNP: Invalid input(s): POCBNP CBG: Recent Labs  Lab 08/15/19 0940 08/15/19 1718  GLUCAP 474* 317*    Radiological Exams on Admission:  No results found.    EKG: Ordered not done yet   A & P   Principal Problem:   Hyperglycemia Active Problems:   Hypertension   Hyperlipidemia   Severe recurrent major  depressive disorder with psychotic features ([place])  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 (HCC)   Hyperkalemia   1. SIRS secondary to electrolyte abnormalities, not sepsis a. Tachycardia, leukocytosis, yellow MEWS b. Continue to treat underlying conditions as below c. PT eval  2. Poorly controlled type 2 diabetes with hyperglycemia a. Recurrent hyperglycemia and hospitalizations, unlikely DKA b. Glucose 520-> 353 without intervention c. Only on glipizide monotherapy at home d. NovoLog 5 units x 1 now e. Sensitive sliding scale f. Hold home glipizide g. HA1C h. UA i. NS maintenance  3. Hyperkalemia a. 6.1-> 5.8 without intervention b. NovoLog 5 units x 1 c. Lokelma d. EKG e. Trend potassium f. Telemetry  4. Pseudohyponatremia a. Corrected for 135 for glucose b. Monitor while getting NS maintenance fluid for above  5. Hypercalcemia a. This a.m. labs correct for 10.9 b. IV fluids and follow-up in a.m.  6. Primary squamous cell carcinoma of the oral cavity s/p resection 3/25 with tracheostomy (decannulation 5/20), PEG tube placement 5/17 a. Presented from cancer center for radiation simulation b. Dietary consult for tube feeding c. Does not take anything by mouth d. Follow-up outpatient e. DNR  7. AKI a. Baseline creatinine 0.8-0.9, Cr 1.25 this a.m. and currently 1.19 b. Hold lisinopril c. IV fluids  8. Oral candidiasis a. Recently prescribed Diflucan 100 mg twice daily x6 days which he recently completed b. Still with oral candidiasis c. Diflucan 100 mg daily x3 days (does not take anything by mouth so no swish and swallow for now)  9. Hypertension a. Continue home Lopressor 50 mg twice daily b. Holding lisinopril c. Lopressor IV as needed  10. Hyperlipidemia a. Continue Lipitor 80 mg per tube  11. Tachycardia a. Currently tachycardic at baseline b. Continue beta-blocker scheduled and as needed as above  12. Depression with psychotic  features a. Continue home psychiatric meds  13. CAD with history of cardiac arrest a. Continue aspirin/Plavix and statin   DVT prophylaxis: Lovenox   Code Status: DNR  Diet: Tube feeds Family Communication: Admission, patients condition and plan of care including tests being ordered have been discussed with the patient who indicates understanding and agrees with the plan and Code Status. Patient's wife at bedside was updated  Disposition Plan: The appropriate patient status for this patient is INPATIENT. Inpatient status is judged to be reasonable and necessary in order to provide the required intensity of service to ensure the patient's safety. The patient's presenting symptoms, physical exam findings, and initial radiographic and laboratory data in the context of their chronic comorbidities is felt to place them at high risk for further clinical deterioration. Furthermore, it is not anticipated that the patient will be medically stable for discharge from the hospital within 2 midnights of admission. The following factors support the patient status of inpatient.   " The patient's presenting symptoms include generalized malaise. " The worrisome physical exam findings include tachycardia, oral candidiasis " The initial radiographic and laboratory data are worrisome because of multiple electrolyte abnormalities including hyperkalemia, hyperglycemia, hypercalcemia, AKI. " The chronic co-morbidities include squamous cell carcinoma of head/neck s/p PEG tube placement.   * I certify that at the point of admission it is my clinical judgment that the patient will require inpatient hospital care spanning beyond 2 midnights from the point of admission due to high intensity of service, high risk for further deterioration and high frequency of surveillance required.*   Status is: Inpatient  Remains inpatient appropriate because:Unsafe d/c plan and IV treatments appropriate due to intensity of illness  or  inability to take PO   Dispo: The patient is from: Home              Anticipated d/c is to: Home              Anticipated d/c date is: 2 days              Patient currently is not medically stable to d/c.      Consultants  . none  Procedures  . none  Time Spent on Admission: 68 minutes    Harold Hedge, DO [facility] Pager [phone] 08/15/2019, 5:53 PM

## 2019-08-15 NOTE — Progress Notes (Signed)
Oncology Nurse Navigator Documentation  To provide support, encouragement and care continuity, met with [name] and his wife during his CT SIM. He tolerated procedure without difficulty, denied questions/concerns.  He was brought back to the nursing clinic to discuss recent ED visit. He demonstrated the use of his glucometer to me and Ebony Hail RN without difficulty. He was sent to lab to have labs drawn at the request of Dr. Isidore Moos. Katelin contacted Sandi Mealy PA in medical oncology at the request of Dr. Isidore Moos to have labs and a weekly visit with [name] during his radiation treatments for close observation during treatment. [name] has agreed to this and a scheduling message was sent by [employer]. They know to call me if they have any further questions or concerns.   Harlow Asa RN, BSN, OCN Head & Neck Oncology Nurse [place] [facility] Phone # [phone]  Fax # [phone]

## 2019-08-15 NOTE — Progress Notes (Signed)
After assessing patient, Dr. Isidore Moos would like patient to come for weekly lab draws and to be evaluated by Alfredia Client (since patient doesn't have a medical oncologist). Called and spoke with [name] directly, and he is agreeable to seeing patient weekly. He requested standing orders for CBC w/ diff, CMP, and magnesium. [name] will also plan to order a HbgA1C when he next sees patient. Orders placed and high priority scheduling message sent to have patient added to Van's schedule for the next several weeks.

## 2019-08-15 NOTE — Progress Notes (Signed)
Patient presents today after CT simulation prior to starting radiation. Per wife: patient was directed to [facility] ED last Friday 5/58/20 after appointment with PCP. Patient was found to be hyponatremic and hyperglycemic. He was given sodium replacement via NaCl infusion as well as a dose of regular insulin. Wife also reports that patient received supplemental nutrition via PEG tube (and was encouraged to pick up diabetic supplementation to help with blood sugar spikes).  Patient was discharged late that evening and told to F/U with PCP. Today patient reports continued fatigue and malaise.   Vitals:   08/15/19 0930 08/15/19 0932  BP: 101/70 (!) 87/65  Pulse: 98 (!) 103  Resp: 18   Temp: (!) 97.1 F (36.2 C)   TempSrc: Temporal   SpO2: 100% 100%  Weight: 126 lb 2 oz (57.2 kg)      CBG @ 09:40 474

## 2019-08-15 NOTE — Progress Notes (Signed)
Radiation Oncology         [phone] ________________________________  Name: [name] MRN: [id number]  Date: 08/15/2019  DOB: [birth year]  Follow-Up Visit Note  CC: Frances Maywood, NP  [place], Neomia Dear, MD  Diagnosis and Prior Radiotherapy:    C04.0   ICD-10-CM   1. Squamous cell carcinoma of oral cavity (HCC)  C06.9   2. 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 ([place])  C04.0 CMP ([place] only)    CBC with Differential (Cancer Center Only)    TSH    CHIEF COMPLAINT:  Here for follow-up and surveillance of oral cancer  Narrative:  The patient returns today for routine follow-up.  Since his consultation, I spoke with his surgeons from the [facility] and [facility] (Sera Edison Nasuti and [place]).  Patient is in a difficult situation as he has multiple comorbidities and failure to thrive following surgery, as well as a history of substance abuse, which are likely to compromise his tolerance of radiotherapy.  At the same time, he is felt to be at high risk for local regional recurrence if he does not undergo radiotherapy.  Patient was seen at the [facility] emergency room 3 days ago when Dr. Edison Nasuti saw the patient in her clinic and had concerns about his fragile status.  Patient had electrolyte abnormalities, in the setting of uncontrolled diabetes, and was given insulin and IV fluids and sent home.  The patient is mostly nonverbal.  His significant other reports confusion regarding how to use his glucometer as well as many questions about nutrition.  There was a lapse in his support from nutrition and swallowing therapy while his care was transferred to [facility].  Referrals have been made in our network.  Patient reports fatigue and malaise.  I will send him upstairs for labs to verify if his electrolytes are within normal limits.  ALLERGIES:  is allergic to morphine and related.  Meds: No current facility-administered medications for this encounter.   No  current outpatient medications on file.   Facility-Administered Medications Ordered in Other Encounters  Medication Dose Route Frequency Provider Last Rate Last Admin  . 0.9 %  sodium chloride infusion   Intravenous Continuous Harold Hedge, MD      . insulin aspart (novoLOG) injection 10 Units  10 Units Subcutaneous Once Harold Hedge, MD      . sodium zirconium cyclosilicate (LOKELMA) packet 10 g  10 g Oral Once Harold Hedge, MD        Physical Findings: The patient is in no acute distress. Patient is alert and oriented. Wt Readings from Last 3 Encounters:  08/15/19 126 lb 2 oz (57.2 kg)  08/08/19 130 lb 2 oz (59 kg)  05/15/19 157 lb (71.2 kg)    weight is 126 lb 2 oz (57.2 kg). His temporal temperature is 97.1 F (36.2 C) (abnormal). His blood pressure is 87/65 (abnormal) and his pulse is 103 (abnormal). His respiration is 18 and oxygen saturation is 100%. Marland Kitchen  He presents in a wheelchair.  He is minimally verbal.  He is cachectic.  He is alert.   Lab Findings:  CMP     Component Value Date/Time   NA 130 (L) 08/16/2019 0505   K 5.4 (H) 08/16/2019 0505   CL 95 (L) 08/16/2019 0505   CO2 23 08/16/2019 0505   GLUCOSE 209 (H) 08/16/2019 0505   BUN 43 (H) 08/16/2019 0505   CREATININE 0.85 08/16/2019 0505  CREATININE 1.25 (H) 08/15/2019 1042   CALCIUM 9.1 08/16/2019 0505   PROT 8.7 (H) 08/15/2019 1042   ALBUMIN 3.4 (L) 08/15/2019 1042   AST 23 08/15/2019 1042   ALT 33 08/15/2019 1042   ALKPHOS 163 (H) 08/15/2019 1042   BILITOT <0.2 (L) 08/15/2019 1042   GFRNONAA >60 08/16/2019 0505   GFRNONAA >60 08/15/2019 1042   GFRAA >60 08/16/2019 0505   GFRAA >60 08/15/2019 1042    Lab Results  Component Value Date   WBC 14.3 (H) 08/15/2019   HGB 10.0 (L) 08/15/2019   HCT 31.4 (L) 08/15/2019   MCV 85.3 08/15/2019   PLT 406 (H) 08/15/2019    Lab Results  Component Value Date   TSH 1.101 08/15/2019    Radiographic Findings: No results found.  Impression/Plan:    We  will send the patient upstairs for lab work.  Nursing spent time with him and his significant other today to instruct him on how to use the glucometer   Referrals have been made to multidisciplinary clinic including nutrition, swallowing therapy, speech therapy, social work, for additional support   Nutritional samples were given to the patient significant other to instill in his PEG tube, the samples are compatible with his diabetic needs.  (Addendum: CMP demonstrates multiple electrolyte abnormalities in the setting of uncontrolled diabetes.  Patient will be directly admitted to [facility].  I have spoken with the [facility] team.  Given the patient's multiple comorbidities, I will have a discussion with Dr. Hendricks Limes about plans moving forward.  I will discuss the pros and cons of proceeding with radiotherapy versus delaying radiotherapy versus foregoing it altogether.)  On date of service, in total, I spent 30 minutes on this encounter.  The patient was seen in person. _____________________________________   Eppie Gibson, MD

## 2019-08-15 NOTE — Telephone Encounter (Signed)
Notified by Dr. Isidore Moos that she spoke with hospitalist Dr. Waldron Labs and he has agreed to direct admit patient. He recommended patient be placed on a telemetry bed for observation under diagnosis of hyperglycemia. Called and spoke with Shirlean Mylar in patient placement and provided above information. Per Shirlean Mylar there are currently no telemetry beds available, but she will call myself and the patient's wife when something becomes available.   Called patient's wife [name] with update, and to let her know that someone from patient placement will be calling her when bed is available for patient. Provided my direct line and advised her to call me should patient's condition change and he need to come to ED. Wife verbalized understanding and agreement, and denied any other needs at this time.

## 2019-08-16 ENCOUNTER — Telehealth: Payer: Self-pay | Admitting: *Deleted

## 2019-08-16 ENCOUNTER — Encounter: Payer: Self-pay | Admitting: Radiation Oncology

## 2019-08-16 DIAGNOSIS — E878 Other disorders of electrolyte and fluid balance, not elsewhere classified: Secondary | ICD-10-CM

## 2019-08-16 DIAGNOSIS — E1165 Type 2 diabetes mellitus with hyperglycemia: Secondary | ICD-10-CM

## 2019-08-16 DIAGNOSIS — R6511 Systemic inflammatory response syndrome (SIRS) of non-infectious origin with acute organ dysfunction: Secondary | ICD-10-CM

## 2019-08-16 DIAGNOSIS — E440 Moderate protein-calorie malnutrition: Secondary | ICD-10-CM | POA: Insufficient documentation

## 2019-08-16 DIAGNOSIS — N179 Acute kidney failure, unspecified: Principal | ICD-10-CM

## 2019-08-16 LAB — BASIC METABOLIC PANEL
Anion gap: 12 (ref 5–15)
BUN: 43 mg/dL — ABNORMAL HIGH (ref 6–20)
CO2: 23 mmol/L (ref 22–32)
Calcium: 9.1 mg/dL (ref 8.9–10.3)
Chloride: 95 mmol/L — ABNORMAL LOW (ref 98–111)
Creatinine, Ser: 0.85 mg/dL (ref 0.61–1.24)
GFR calc Af Amer: >60 mL/min (ref >60)
GFR calc non Af Amer: >60 mL/min (ref >60)
Glucose, Bld: 209 mg/dL — ABNORMAL HIGH (ref 70–99)
Potassium: 5.4 mmol/L — ABNORMAL HIGH (ref 3.5–5.1)
Sodium: 130 mmol/L — ABNORMAL LOW (ref 135–145)

## 2019-08-16 LAB — CBC
HCT: 29.9 % — ABNORMAL LOW (ref 39.0–52.0)
Hemoglobin: 9.6 g/dL — ABNORMAL LOW (ref 13.0–17.0)
MCH: 28.1 pg (ref 26.0–34.0)
MCHC: 32.1 g/dL (ref 30.0–36.0)
MCV: 87.4 fL (ref 80.0–100.0)
Platelets: 340 10*3/uL (ref 150–400)
RBC: 3.42 MIL/uL — ABNORMAL LOW (ref 4.22–5.81)
RDW: 14.1 % (ref 11.5–15.5)
WBC: 10.3 10*3/uL (ref 4.0–10.5)
nRBC: 0 % (ref 0.0–0.2)

## 2019-08-16 LAB — GLUCOSE, CAPILLARY
Glucose-Capillary: 206 mg/dL — ABNORMAL HIGH (ref 70–99)
Glucose-Capillary: 220 mg/dL — ABNORMAL HIGH (ref 70–99)
Glucose-Capillary: 242 mg/dL — ABNORMAL HIGH (ref 70–99)
Glucose-Capillary: 188 mg/dL — ABNORMAL HIGH (ref 70–99)
Glucose-Capillary: 251 mg/dL — ABNORMAL HIGH (ref 70–99)

## 2019-08-16 LAB — POTASSIUM
Potassium: 5.5 mmol/L — ABNORMAL HIGH (ref 3.5–5.1)
Potassium: 5.4 mmol/L — ABNORMAL HIGH (ref 3.5–5.1)

## 2019-08-16 LAB — HIV ANTIBODY (ROUTINE TESTING W REFLEX): HIV Screen 4th Generation wRfx: NONREACTIVE

## 2019-08-16 MED ORDER — ASPIRIN EC 81 MG PO TBEC
81.0000 mg | DELAYED_RELEASE_TABLET | Freq: Every day | ORAL | Status: DC
  Administered 2019-08-16 – 2019-08-17 (×2): 81 mg via ORAL
  Filled 2019-08-16 (×2): qty 1

## 2019-08-16 MED ORDER — INSULIN DETEMIR 100 UNIT/ML ~~LOC~~ SOLN
8.0000 [IU] | Freq: Every day | SUBCUTANEOUS | Status: DC
  Administered 2019-08-16: 8 [IU] via SUBCUTANEOUS
  Filled 2019-08-16 (×2): qty 0.08

## 2019-08-16 MED ORDER — KATE FARMS STANDARD 1.4 PO LIQD
325.0000 mL | Freq: Every day | ORAL | Status: DC
  Administered 2019-08-16 – 2019-08-17 (×4): 325 mL
  Filled 2019-08-16 (×6): qty 325

## 2019-08-16 MED ORDER — FREE WATER
120.0000 mL | Freq: Every day | Status: DC
  Administered 2019-08-16 – 2019-08-17 (×4): 120 mL

## 2019-08-16 MED ORDER — PRO-STAT SUGAR FREE PO LIQD
30.0000 mL | Freq: Two times a day (BID) | ORAL | Status: DC
  Administered 2019-08-16 – 2019-08-17 (×2): 30 mL
  Filled 2019-08-16 (×2): qty 30

## 2019-08-16 MED ORDER — SODIUM CHLORIDE 0.9 % IV SOLN: INTRAVENOUS | Status: AC

## 2019-08-16 MED ORDER — SODIUM ZIRCONIUM CYCLOSILICATE 10 G PO PACK
10.0000 g | PACK | Freq: Once | ORAL | Status: AC
  Administered 2019-08-16: 10 g via ORAL
  Filled 2019-08-16: qty 1

## 2019-08-16 NOTE — Progress Notes (Signed)
[facility]                                                                              Patient Demographics  Andrzej Preast, is a 59 y.o. male, DOB - [birth year], [id number]  Admit date - 08/15/2019   Admitting Physician Harold Hedge, MD  Outpatient Primary MD for the patient is Frances Maywood, NP  Outpatient specialists:   LOS - 1  days   Medical records reviewed and are as summarized below:    No chief complaint on file.      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 post resection 3/25 with tracheostomy decannulation on 5/20,PEG tube placement 5/17, hypertension, hyperlipidemia, depression with psychotic features, oral candidiasis, CAD s/p cardiac arrest, poorly controlled type 2 diabetes not on insulin with frequent hyperglycemia with multiple recent hospitalizations for SCC and PEG tube placement as well as hyperglycemia who presented today from cancer center where he presented for CT simulation prior to starting head/neck radiation with abnormal labs including glucose 520, K6.1, NA 128, creatinine 1.25, calcium 10.4, alkaline phosphatase 163, WBC 14.3. He had a similar recent hospitalization on 08/11/2019 when he was found to be hyponatremic and hyperglycemic and treated with insulin and saline. Patient was admitted for further work-up.  Assessment & Plan    Principal Problem:   SIRS (systemic inflammatory response syndrome) (HCC) due to electrolyte abnormalities -Patient presented with tachycardia, leukocytosis, acute kidney injury, hyperkalemia.  Sepsis ruled out. -Continue to treat underlying conditions -PT OT evaluation  Active Problems: Diabetes mellitus type 2, NIDDM, uncontrolled with hyperglycemia Hemoglobin A1c 9.8, CBGs 520 on the day of admission -Patient was recently started on tube feeding, TwoCal HN 2.0 Cal and was recently told that it was not DM  friendly. -Diabetic coordinator , nutritionist consulted -Will likely benefit from insulin at home given high risk of hypoglycemia with the oral sulfonylurea oral hypoglycemics -Continue sliding scale insulin, added Levemir 8 units daily, will benefit from FlexPen  Hyperkalemia Presented with potassium of 6.1, received Lokelma, insulin -Potassium 5.5 today, will give Lokelma 10g x1  Hypercalcemia -Continue IV fluid hydration   Pseudohyponatremia -Corrected sodium 132 with CBG of 209 -Continue to follow closely, gentle hydration  Primary squamous cell carcinoma of the oral cavity status post resection 3/25 with tracheostomy, decannulation 5/20, PEG tube placement on 5/17 -Appreciate diabetic coordinator and dietitian following -Restarted on tube feeds, follow CBGs closely   AKI Baseline creatinine 0.8-0.9 Creatinine 1.2 at the time of admission Improving, creatinine 0.8   Oral candidiasis Continue Diflucan 100 mg daily for 3 days  Hypertension, hyperlipidemia Continue Lopressor, Lipitor 80 mg per tube   Depression with psychotic features Currently stable, continue psych medications   Coronary disease with history of cardiac arrest Continue aspirin, Plavix, statin  Moderate to severe protein calorie malnutrition Estimated body mass index is 18.1 kg/m as calculated from the following:   Height as of this encounter: 5\' 10"  (1.778 m).   Weight as of this encounter: 57.2 kg.  Code Status full code DVT Prophylaxis: Lovenox Family Communication:  Discussed all imaging results, lab results, explained to the patient   Disposition Plan:     Status is: Inpatient  Remains inpatient appropriate because:Inpatient level of care appropriate due to severity of illness   Dispo: The patient is from: Home              Anticipated d/c is to: Home              Anticipated d/c date is: 2 days              Patient currently is not medically stable to d/c.       Time Spent in  minutes   25 minutes  Procedures:  None  Consultants:      Antimicrobials:   Anti-infectives (From admission, onward)   Start     Dose/Rate Route Frequency Ordered Stop   08/15/19 2000  fluconazole (DIFLUCAN) tablet 100 mg    Note to Pharmacy: Take 2 tablets today, then 1 tablet daily x 6 more days.     100 mg Per Tube Daily 08/15/19 1747 08/18/19 0959          Medications  Scheduled Meds: . aspirin EC  81 mg Oral Daily  . clopidogrel  75 mg Per Tube Daily  . enoxaparin (LOVENOX) injection  40 mg Subcutaneous Q24H  . feeding supplement (KATE FARMS STANDARD 1.4)  325 mL Per Tube 5 X Daily  . feeding supplement (PRO-STAT SUGAR FREE 64)  30 mL Per Tube BID  . fluconazole  100 mg Per Tube Daily  . free water  120 mL Per Tube 5 X Daily  . insulin aspart  0-9 Units Subcutaneous TID WC  . metoprolol tartrate  50 mg Per Tube BID  . phenytoin  100 mg Per Tube TID  . valproic acid  500 mg Per Tube BID   Continuous Infusions: PRN Meds:.acetaminophen **OR** acetaminophen, metoprolol tartrate, ondansetron **OR** ondansetron (ZOFRAN) IV, polyethylene glycol      Subjective:   Josejesus Drews was seen and examined today.  No acute complaints, Patient denies dizziness, chest pain, shortness of breath, abdominal pain, No acute events overnight.    Objective:   Vitals:   08/16/19 0517 08/16/19 0940 08/16/19 1219 08/16/19 1329  BP: 110/75  113/75 126/64  Pulse: (!) 102 (!) 111 87 (!) 102  Resp: 17  16 16   Temp: 97.7 F (36.5 C)  98.7 F (37.1 C) 98.2 F (36.8 C)  TempSrc: Oral  Oral Oral  SpO2: 99%  100% 100%  Weight:      Height:        Intake/Output Summary (Last 24 hours) at 08/16/2019 1416 Last data filed at 08/16/2019 0810 Gross per 24 hour  Intake 1122.84 ml  Output 375 ml  Net 747.84 ml     Wt Readings from Last 3 Encounters:  08/16/19 57.2 kg  08/15/19 57.2 kg  08/08/19 59 kg     Exam  General: Alert and oriented x 3, NAD  Cardiovascular: S1 S2  auscultated, no murmurs, RRR  Respiratory: Clear to auscultation bilaterally, no wheezing, rales or rhonchi  Gastrointestinal: Soft, nontender, nondistended, + bowel sounds  Ext: no pedal edema bilaterally  Neuro: AAOx3, Cr N's II- XII. Strength 5/5 upper and lower extremities bilaterally  Musculoskeletal: No digital cyanosis, clubbing, Lantus  Skin: No rashes  Psych: Normal affect and demeanor, alert and oriented x3    Data Reviewed:  I have personally reviewed following labs and imaging studies  Micro Results No  results found for this or any previous visit (from the past 240 hour(s)).  Radiology Reports No results found.  Lab Data:  CBC: Recent Labs  Lab 08/15/19 1042 08/15/19 1721 08/16/19 0505  WBC 14.3* 14.8* 10.3  NEUTROABS 10.5*  --   --   HGB 10.0* 10.3* 9.6*  HCT 31.4* 32.2* 29.9*  MCV 85.3 88.2 87.4  PLT 406* 415* [id number]   Basic Metabolic Panel: Recent Labs  Lab 08/15/19 1042 08/15/19 1721 08/15/19 1722 08/16/19 0505 08/16/19 1341  NA 128*  --  131* 130*  --   K 6.1*  --  5.8* 5.4* 5.5*  CL 89*  --  91* 95*  --   CO2 23  --  25 23  --   GLUCOSE 520*  --  353* 209*  --   BUN 33*  --  42* 43*  --   CREATININE 1.25* 1.22 1.19 0.85  --   CALCIUM 10.4*  --  10.1 9.1  --   MG  --  2.1  --   --   --    GFR: Estimated Creatinine Clearance: 77.6 mL/min (by C-G formula based on SCr of 0.85 mg/dL). Liver Function Tests: Recent Labs  Lab 08/15/19 1042  AST 23  ALT 33  ALKPHOS 163*  BILITOT <0.2*  PROT 8.7*  ALBUMIN 3.4*   No results for input(s): LIPASE, AMYLASE in the last 168 hours. No results for input(s): AMMONIA in the last 168 hours. Coagulation Profile: No results for input(s): INR, PROTIME in the last 168 hours. Cardiac Enzymes: No results for input(s): CKTOTAL, CKMB, CKMBINDEX, TROPONINI in the last 168 hours. BNP (last 3 results) No results for input(s): PROBNP in the last 8760 hours. HbA1C: Recent Labs    08/15/19 1721  HGBA1C  9.8*   CBG: Recent Labs  Lab 08/15/19 1718 08/15/19 2046 08/16/19 0302 08/16/19 0752 08/16/19 1142  GLUCAP 317* 214* 206* 220* 242*   Lipid Profile: No results for input(s): CHOL, HDL, LDLCALC, TRIG, CHOLHDL, LDLDIRECT in the last 72 hours. Thyroid Function Tests: Recent Labs    08/15/19 1042  TSH 1.101   Anemia Panel: No results for input(s): VITAMINB12, FOLATE, FERRITIN, TIBC, IRON, RETICCTPCT in the last 72 hours. Urine analysis:    Component Value Date/Time   COLORURINE YELLOW 06/15/2014 0422   APPEARANCEUR CLEAR 06/15/2014 0422   LABSPEC 1.024 06/15/2014 0422   PHURINE 6.5 06/15/2014 0422   GLUCOSEU NEGATIVE 06/15/2014 0422   HGBUR MODERATE (A) 06/15/2014 0422   BILIRUBINUR NEGATIVE 06/15/2014 0422   BILIRUBINUR NEG 07/31/2010 1455   KETONESUR 15 (A) 06/15/2014 0422   PROTEINUR NEGATIVE 06/15/2014 0422   UROBILINOGEN 1.0 06/15/2014 0422   NITRITE NEGATIVE 06/15/2014 0422   LEUKOCYTESUR NEGATIVE 06/15/2014 0422     Jayvier Burgher M.D. [facility] 08/16/2019, 2:16 PM   Call night coverage person covering after 7pm

## 2019-08-16 NOTE — Progress Notes (Addendum)
Inpatient Diabetes Program Recommendations  AACE/ADA: New Consensus Statement on Inpatient Glycemic Control (2015)  Target Ranges:  Prepandial:   less than 140 mg/dL      Peak postprandial:   less than 180 mg/dL (1-2 hours)      Critically ill patients:  140 - 180 mg/dL   Lab Results  Component Value Date   GLUCAP 220 (H) 08/16/2019   HGBA1C 9.8 (H) 08/15/2019    Review of Glycemic Control  Diabetes history: DM 2 Outpatient Diabetes medications: Glipizide 5 mg bid Current orders for Inpatient glycemic control:  Novolog 0-9 units tid  Inpatient Diabetes Program Recommendations:    -  Consider Levemir 8 units while inpatient. -  May also need Tube Feed Bolus coverage (await Dietitian recommendations) Novolog 3 units 5 times a day at the following times coinciding with TF (0800, 1100, 1400, 1700, 2000)  Spoke with pt in room and Facetime with wife [name]). Pt starts feeds around 7 am and will consume via tube 6 cartons of feed. He will bolus feed until he is full, per patient he boluses every 2 hours or so. Wife showed me the feed he was on (TWOCAL HN 2.0 Cal), however they were recently told it was not a DM friendly version (glucose increased into 400's). They also have some Glucerna and Boost glucose control at home. They are welcoming assistance with feeds and glucose control at home.  Pt takes all medication crushed in tube. Wife reports everything scheduled at that time she crushes and combines together.  Spoke to patient and wife regarding glucose control at home and possibly needing insulin. Glucose trends run between 200-400 at home. Wife to come by later today for [place] to discuss and for me to show them how to do the insulin pen. Pt reports wife preps with feed and medication at home.  Spoke with Jess, Dietitian, regarding pt home regimen and routine.   Updated Museum/gallery conservator, Surveyor, mining.  Addendum 1250 pm: Spoke to wife at bedside. Showed insulin pen to patient and wife. Discussed  insulin and Tube feed brand Dietitian ordered. Will watch glucose trends. RN gave Tube Feed bolus before I left.    Thanks,  Tama Headings RN, MSN, BC-ADM Inpatient Diabetes Coordinator Team Pager [phone] (8a-5p)

## 2019-08-16 NOTE — Progress Notes (Signed)
RN spoke with patient regarding his tube feedings at home. Pt could not remember what the name of the feeding was. This RN spoke with the pt wife, [name], regarding this. She informed this RN the pt had been taking Glucerna d/t his blood glucose being uncontrolled and high. This RN paged the on call, Sharlet Salina NP, and received a one time dose of Glucerna since the pt had not eaten since this morning early. The one time dose was given and the pt was reminded the dietician should consult with the pt in the AM regarding making a schedule for his bolus feedings. The pt stated he understood.

## 2019-08-16 NOTE — Telephone Encounter (Signed)
Faxed requested consult note to [facility] @ [phone]

## 2019-08-16 NOTE — Evaluation (Addendum)
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 He had a similar recent hospitalization on 08/11/2019 when he was found to be hyponatremic and hyperglycemic and treated with insulin and saline. Dx of SIRS, hyperglycemia, hyponatremia.  Clinical Impression  Pt admitted with above diagnosis. Pt ambulated 120' with RW without loss of balance, distance limited by fatigue. Instructed pt in seated BLE strengthening exercises to be done independently.  Rollator recommended for home for energy conservation. Pt currently with functional limitations due to the deficits listed below (see PT Problem List). Pt will benefit from skilled PT to increase their independence and safety with mobility to allow discharge to the venue listed below.       Follow Up Recommendations No PT follow up    Equipment Recommendations  Other (comment)(4 wheeled rolling walker)    Recommendations for Other Services       Precautions / Restrictions Precautions Precautions: Fall Precaution Comments: pt denies falls in past 1 year Restrictions Weight Bearing Restrictions: No      Mobility  Bed Mobility Overal bed mobility: Modified  Independent             General bed mobility comments: used rail  Transfers Overall transfer level: Needs assistance Equipment used: Rolling walker (2 wheeled) Transfers: Sit to/from Stand Sit to Stand: Supervision         General transfer comment: VCs hand placement, mild posterior lean initially but pt able to self correct  Ambulation/Gait Ambulation/Gait assistance: Supervision Gait Distance (Feet): 120 Feet Assistive device: Rolling walker (2 wheeled) Gait Pattern/deviations: WFL(Within Functional Limits) Gait velocity: WFL   General Gait Details: steady, no  loss of balance  Stairs            Wheelchair Mobility    Modified Rankin (Stroke Patients Only)       Balance Overall balance assessment: Mild deficits observed, not formally tested   Sitting balance-Leahy Scale: Good       Standing balance-Leahy Scale: Fair Standing balance comment: mild posterior lean with sit to stand; steady wtih RW when ambulating                             Pertinent Vitals/Pain Pain Assessment: No/denies pain    Home Living Family/patient expects to be discharged to:: Private residence Living Arrangements: Spouse/significant other Available Help at Discharge: Family   Home Access: Stairs to enter Entrance Stairs-Rails: None Entrance Stairs-Number of Steps: 4 Home Layout: One level Home Equipment: Cane - single point;Bedside commode      Prior Function Level of Independence: Independent with assistive device(s)         Comments: walks with [facility], wife reports pt fatigues quicklypt able to perform ADLs independently however wife has been assisting with sponge baths     Hand Dominance  Extremity/Trunk Assessment   Upper Extremity Assessment Upper Extremity Assessment: Overall WFL for tasks assessed    Lower Extremity Assessment Lower Extremity Assessment: Overall WFL for tasks assessed    Cervical / Trunk Assessment Cervical /  Trunk Assessment: Normal  Communication   Communication: Expressive difficulties(speech is mumbled, difficult to understand, assume this is due to oral cancer)  Cognition Arousal/Alertness: Awake/alert Behavior During Therapy: WFL for tasks assessed/performed Overall Cognitive Status: Within Functional Limits for tasks assessed                                        General Comments      Exercises General Exercises - Lower Extremity Ankle Circles/Pumps: AROM;Both;10 reps;Seated Long Arc Quad: AROM;Both;10 reps;Seated Hip Flexion/Marching: AROM;Both;10 reps;Seated   Assessment/Plan    PT Assessment Patient needs continued PT services  PT Problem List Decreased activity tolerance;Decreased mobility       PT Treatment Interventions Gait training;Therapeutic activities;Balance training;Functional mobility training;Therapeutic exercise;Patient/family education    PT Goals (Current goals can be found in the Care Plan section)  Acute Rehab PT Goals Patient Stated Goal: to be able to walk farther PT Goal Formulation: With patient/family Time For Goal Achievement: 08/30/19 Potential to Achieve Goals: Good    Frequency Min 3X/week   Barriers to discharge        Co-evaluation               AM-PAC PT "6 Clicks" Mobility  Outcome Measure Help needed turning from your back to your side while in a flat bed without using bedrails?: None Help needed moving from lying on your back to sitting on the side of a flat bed without using bedrails?: A Little Help needed moving to and from a bed to a chair (including a wheelchair)?: A Little Help needed standing up from a chair using your arms (e.g., wheelchair or bedside chair)?: None Help needed to walk in hospital room?: A Little Help needed climbing 3-5 steps with a railing? : A Little 6 Click Score: 20    End of Session Equipment Utilized During Treatment: Gait belt Activity Tolerance: Patient tolerated treatment  well Patient left: in bed;with call bell/phone within reach;with family/visitor present Nurse Communication: Mobility status PT Visit Diagnosis: Difficulty in walking, not elsewhere classified (R26.2)    Time: [id number] PT Time Calculation (min) (ACUTE ONLY): 14 min   Charges:   PT Evaluation $PT Eval Low Complexity: 1 Low          Philomena Doheny PT 08/16/2019  Acute Rehabilitation Services Pager [phone] Office [phone]

## 2019-08-16 NOTE — Care Plan (Signed)
Pt alert and aware wife at bedside. She did the talking. She shared recent history with me. She was feeling overwhelmed with the events of her husband and also taking care of her mother.  She was looking for spiritual guidance and support. I offered caring and supportive presence, prayers and blessings and sacred song. Further follow-up has been requested.

## 2019-08-16 NOTE — Progress Notes (Signed)
Initial Nutrition Assessment  DOCUMENTATION CODES:   Non-severe (moderate) malnutrition in context of chronic illness, Underweight  INTERVENTION:  - will order 1 carton (325 ml) [employer] 1.4 x5/day with 30 ml prostat BID and 120 ml free water per TF bolus. - this regimen will provide 2475 kcal, 130 grams protein, 275 grams carbohydrate, 3625 mg potassium.   NUTRITION DIAGNOSIS:   Moderate Malnutrition related to chronic illness, cancer and cancer related treatments as evidenced by moderate fat depletion, moderate muscle depletion, severe muscle depletion.  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 He presented from the [place] where he presented for CT simulation prior to starting head/neck radiation with abnormal.  Patient has been NPO since admission. Patient laying in bed with no family/visitors present. He does not consume anything PO at home; all items are via PEG. Patient can be difficult to understand d/t previous oral resection. He is feeling hungry and denies any nausea or abdominal pain/pressure/tightness. He uses a cane at home when ambulating.   Able to talk with DM Coordinator earlier today who was able to [facility] with patient's wife earlier today and obtain information about home TF regimen. Patient was encouraged to administer 1 carton  (237 ml) TwoCal HN x6/day. Patient administers TF boluses between 0700-2000 daily but has no set schedule; will bolus varying amount every few hours until he feels full. With this TF formula, he was having hyperglycemia into the 400s. For a short, unknown, number of days PTA, he was using Glucerna and Boost Glucose Control via tube.   Per chart review, weight today is 126 lb, weight on 5/25 was 130 lb, and weight on 3/1 was 157 lb. This indicates 31 lb weight loss (19.7% body weight) in the past 3 months; significant for time frame.    Labs reviewed; A1c: 9.8% (per DM Coordinator), CBGs: 206 and 220 mg/dl, Na: 130 mmol/l, K: 5.4 mmol/l, Cl: 95 mmol/l, BUN: 43 mg/dl. Medications reviewed; 100 mg diflucan per PEG/day, sliding scale novolog, 100 mg dilatin per PEG TID, 10 g lokelma per PEG x1 dose 6/1 and x1 dose 6/2.    NUTRITION - FOCUSED PHYSICAL EXAM:    Most Recent Value  Orbital Region  Moderate depletion  Upper Arm Region  Moderate depletion  Thoracic and Lumbar Region  Unable to assess  Buccal Region  Severe depletion  Temple Region  Moderate depletion  Clavicle Bone Region  Moderate depletion  Clavicle and Acromion Bone Region  Severe depletion  Scapular Bone Region  Unable to assess  Dorsal Hand  Mild depletion  Patellar Region  Moderate depletion  Anterior Thigh Region  Moderate depletion  Posterior Calf Region  Moderate depletion  Edema (RD Assessment)  None  Hair  Reviewed  Eyes  Reviewed  Mouth  Reviewed  Skin  Reviewed  Nails  Reviewed       Diet Order:   Diet Order    None      EDUCATION NEEDS:  Not appropriate for education at this time  Skin:  Skin Assessment: Reviewed RN Assessment  Last BM:  6/2  Height:   Ht Readings from Last 1 Encounters:  08/16/19 5\' 10"  (1.778 m)    Weight:   Wt Readings from Last 1 Encounters:  08/16/19 57.2 kg    Estimated Nutritional Needs:  Kcal:  2400-2600 kcal Protein:  120-135 grams Fluid:  >/= 2.5  L/day     Jarome Matin, MS, RD, LDN, CNSC Inpatient Clinical Dietitian RD pager # available in [facility]  After hours/weekend pager # available in [facility]

## 2019-08-17 ENCOUNTER — Ambulatory Visit: Payer: Medicare PPO | Admitting: Physical Therapy

## 2019-08-17 ENCOUNTER — Inpatient Hospital Stay: Payer: Medicare PPO | Admitting: Nutrition

## 2019-08-17 ENCOUNTER — Ambulatory Visit: Payer: Medicare PPO

## 2019-08-17 LAB — CBC
HCT: 29.2 % — ABNORMAL LOW (ref 39.0–52.0)
Hemoglobin: 9.2 g/dL — ABNORMAL LOW (ref 13.0–17.0)
MCH: 27.5 pg (ref 26.0–34.0)
MCHC: 31.5 g/dL (ref 30.0–36.0)
MCV: 87.2 fL (ref 80.0–100.0)
Platelets: 342 10*3/uL (ref 150–400)
RBC: 3.35 MIL/uL — ABNORMAL LOW (ref 4.22–5.81)
RDW: 14.2 % (ref 11.5–15.5)
WBC: 10 10*3/uL (ref 4.0–10.5)
nRBC: 0 % (ref 0.0–0.2)

## 2019-08-17 LAB — BASIC METABOLIC PANEL
Anion gap: 13 (ref 5–15)
BUN: 28 mg/dL — ABNORMAL HIGH (ref 6–20)
CO2: 22 mmol/L (ref 22–32)
Calcium: 9.4 mg/dL (ref 8.9–10.3)
Chloride: 99 mmol/L (ref 98–111)
Creatinine, Ser: 0.56 mg/dL — ABNORMAL LOW (ref 0.61–1.24)
GFR calc Af Amer: >60 mL/min (ref >60)
GFR calc non Af Amer: >60 mL/min (ref >60)
Glucose, Bld: 189 mg/dL — ABNORMAL HIGH (ref 70–99)
Potassium: 5 mmol/L (ref 3.5–5.1)
Sodium: 134 mmol/L — ABNORMAL LOW (ref 135–145)

## 2019-08-17 LAB — GLUCOSE, CAPILLARY
Glucose-Capillary: 218 mg/dL — ABNORMAL HIGH (ref 70–99)
Glucose-Capillary: 198 mg/dL — ABNORMAL HIGH (ref 70–99)

## 2019-08-17 MED ORDER — INSULIN DETEMIR 100 UNIT/ML ~~LOC~~ SOLN
12.0000 [IU] | Freq: Every day | SUBCUTANEOUS | Status: DC
  Administered 2019-08-17: 12 [IU] via SUBCUTANEOUS
  Filled 2019-08-17: qty 0.12

## 2019-08-17 MED ORDER — PRO-STAT SUGAR FREE PO LIQD: 30.0000 mL | Freq: Two times a day (BID) | ORAL | 0 refills | Status: AC

## 2019-08-17 MED ORDER — INSULIN DETEMIR 100 UNIT/ML FLEXPEN: 12.0000 [IU] | PEN_INJECTOR | Freq: Every day | SUBCUTANEOUS | 3 refills | Status: AC

## 2019-08-17 MED ORDER — INSULIN PEN NEEDLE 32G X 4 MM MISC: 12.0000 [IU] | Freq: Every day | 3 refills | Status: DC

## 2019-08-17 MED ORDER — FREE WATER: 120.0000 mL | Freq: Every day | Status: AC

## 2019-08-17 MED ORDER — KATE FARMS STANDARD 1.4 PO LIQD: 325.0000 mL | Freq: Every day | ORAL | 0 refills | Status: AC

## 2019-08-17 NOTE — Discharge Summary (Signed)
Physician Discharge Summary   Patient ID: [name] MRN: [id number] DOB/AGE: July 08, 1960 59 y.o.  Admit date: 08/15/2019 Discharge date: 08/17/2019  Primary Care Physician:  Frances Maywood, NP   Recommendations for Outpatient Follow-up:  1. Follow up with PCP in 1-2 weeks 2. Patient placed on the Levemir FlexPen 12 units daily (May substitute to Lantus if more affordable)  Home Health: Home health PT arranged Equipment/Devices: DME four-wheel walker with seat  Discharge Condition: stable  CODE STATUS: FULL  Diet recommendation: TUBE FEEDS    Discharge Diagnoses:     SIRS (systemic inflammatory response syndrome  Hyperkalemia  Diabetes mellitus type 2 NIDDM uncontrolled with hyperglycemia . Hyperkalemia . 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 Hypertension . Severe recurrent major depressive disorder with psychotic features ([place]) . Hyperlipidemia Mild acute kidney injury Moderate to severe protein calorie malnutrition  Consults:  Diabetic coordinator    Allergies:   Allergies  Allergen Reactions  . Morphine And Related Itching     DISCHARGE MEDICATIONS: Allergies as of 08/17/2019      Reactions   Morphine And Related Itching      Medication List    STOP taking these medications   cloNIDine 0.2 MG tablet Commonly known as: CATAPRES   lisinopril 20 MG tablet Commonly known as: ZESTRIL     TAKE these medications   aspirin 81 MG EC tablet Take 1 tablet (81 mg total) by mouth daily.   clopidogrel 75 MG tablet Commonly known as: PLAVIX Take 75 mg by mouth daily.   cyclobenzaprine 10 MG tablet Commonly known as: FLEXERIL Take 10 mg by mouth 3 (three) times daily as needed for muscle spasms.   feeding supplement (KATE FARMS STANDARD 1.4) Liqd liquid Place 325 mLs into feeding tube 5 (five) times daily.   feeding supplement (PRO-STAT SUGAR FREE 64) Liqd Place 30 mLs into feeding tube 2 (two) times daily.   free water  Soln Place 120 mLs into feeding tube 5 (five) times daily.   glipiZIDE 5 MG tablet Commonly known as: GLUCOTROL Take 5 mg by mouth 2 (two) times daily before a meal.   insulin detemir 100 UNIT/ML FlexPen Commonly known as: LEVEMIR Inject 12 Units into the skin daily. May substitute for Lantus FlexPen if Levemir is not available   Insulin Pen Needle 32G X 4 MM Misc 12 Units by Does not apply route daily.   magnesium oxide 400 MG tablet Commonly known as: MAG-OX Take 400 mg by mouth 2 (two) times daily.   metoprolol tartrate 50 MG tablet Commonly known as: LOPRESSOR Take 50 mg by mouth in the morning and at bedtime.   phenytoin 125 MG/5ML suspension Commonly known as: DILANTIN Take 100 mg by mouth 3 (three) times daily.   sodium fluoride 1.1 % Crea dental cream Commonly known as: PreviDent 5000 Plus Apply thin ribbon of cream to tooth brush. Brush teeth for 2 minutes. Spit out excess-DO NOT swallow. DO NOT rinse afterwards. Repeat nightly.   valproic acid 250 MG/5ML solution Commonly known as: DEPAKENE Take 500 mg by mouth in the morning and at bedtime.            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 He had a similar recent hospitalization on 08/11/2019 when he was found to be hyponatremic and hyperglycemic and treated with insulin and saline. Patient was admitted for further work-up.  Hospital Course:   SIRS (systemic inflammatory response syndrome) (HCC) due to electrolyte abnormalities -Patient presented with tachycardia, leukocytosis, acute kidney injury, hyperkalemia.  Sepsis ruled out. -Patient was placed on gentle hydration, electrolyte abnormalities corrected   Diabetes mellitus type 2, NIDDM, uncontrolled with hyperglycemia Hemoglobin A1c 9.8, CBGs 520 on the day of admission -Patient was recently started on tube feeding, TwoCal HN 2.0 Cal and was recently told that it was not DM friendly. -Diabetic coordinator , nutritionist were consulted.  Patient was changed to [facility] standard tube feeds with prostat. -Patient was started on Levemir 12 units daily, per diabetic coordinator, continue glipizide   Hyperkalemia Presented with potassium of 6.1, received Lokelma, insulin Lisinopril discontinued Potassium 5.0 at the time of discharge  Hypercalcemia -Improved, calcium 9.4 at the time of discharge   Pseudohyponatremia -Corrected sodium 132 with CBG of 209 -Sodium 134 at the time of discharge  Primary squamous cell carcinoma of the oral cavity status post resection 3/25 with tracheostomy, decannulation 5/20, PEG tube placement on 5/17 Restarted tube feeds, tolerating well   AKI Baseline creatinine 0.8-0.9 Creatinine 1.2 at the time of admission Improved, creatinine 0.5 at the time of discharge   Oral candidiasis Continue Diflucan 100 mg daily for 3 days  Hypertension, hyperlipidemia Continue  Lopressor, Lipitor 80 mg per tube   Depression with psychotic features Currently stable, continue psych medications   Coronary disease with history of cardiac arrest Continue aspirin, Plavix, statin  Moderate to severe protein calorie malnutrition Estimated body mass index is 18.1 kg/m as calculated from the following:   Height as of this encounter: 5\' 10"  (1.778 m).   Weight as of this encounter: 57.2 kg.   Day of Discharge S: No acute issues overnight.  No fevers or chills.  Tolerating tube feeds.  CBGs better controlled today.  BP 139/80 (BP Location: Left Arm)   Pulse 99   Temp 97.7 F (36.5 C) (Oral)   Resp 17   Ht 5\' 10"  (1.778 m)   Wt 57.2 kg   SpO2 100%   BMI 18.10 kg/m   Physical Exam: General: Alert and awake oriented, NAD HEENT: anicteric sclera, pupils reactive to light and accommodation CVS: S1-S2 clear no murmur rubs or gallops Chest: clear to auscultation bilaterally, no wheezing rales or rhonchi Abdomen: soft nontender, nondistended, PEG tube Extremities: no cyanosis, clubbing or edema noted bilaterally Neuro: No new deficits    Get Medicines reviewed and adjusted: Please take all your medications with you for your next visit with  your Primary MD  Please request your Primary MD to go over all hospital tests and procedure/radiological results at the follow up. Please ask your Primary MD to get all Hospital records sent to his/her office.  If you experience worsening of your admission symptoms, develop shortness of breath, life threatening emergency, suicidal or homicidal thoughts you must seek medical attention immediately by calling 911 or calling your MD immediately  if symptoms less severe.  You must read complete instructions/literature along with all the possible adverse reactions/side effects for all the Medicines you take and that have been prescribed to you. Take any new Medicines after you have completely understood and accept all the  possible adverse reactions/side effects.   Do not drive when taking pain medications.   Do not take more than prescribed Pain, Sleep and Anxiety Medications  Special Instructions: If you have smoked or chewed Tobacco  in the last 2 yrs please stop smoking, stop any regular Alcohol  and or any Recreational drug use.  Wear Seat belts while driving.  Please note  You were cared for by a hospitalist during your hospital stay. Once you are discharged, your primary care physician will handle any further medical issues. Please note that NO REFILLS for any discharge medications will be authorized once you are discharged, as it is imperative that you return to your primary care physician (or establish a relationship with a primary care physician if you do not have one) for your aftercare needs so that they can reassess your need for medications and monitor your lab values.   The results of significant diagnostics from this hospitalization (including imaging, microbiology, ancillary and laboratory) are listed below for reference.      Procedures/Studies:   No results found.    LAB RESULTS: Basic Metabolic Panel: Recent Labs  Lab 08/15/19 1721 08/15/19 1722 08/16/19 0505 08/16/19 1341 08/16/19 1700 08/17/19 0501  NA  --    < > 130*  --   --  134*  K  --    < > 5.4*   < > 5.4* 5.0  CL  --    < > 95*  --   --  99  CO2  --    < > 23  --   --  22  GLUCOSE  --    < > 209*  --   --  189*  BUN  --    < > 43*  --   --  28*  CREATININE 1.22   < > 0.85  --   --  0.56*  CALCIUM  --    < > 9.1  --   --  9.4  MG 2.1  --   --   --   --   --    < > = values in this interval not displayed.   Liver Function Tests: Recent Labs  Lab 08/15/19 1042  AST 23  ALT 33  ALKPHOS 163*  BILITOT <0.2*  PROT 8.7*  ALBUMIN 3.4*   No results for input(s): LIPASE, AMYLASE in the last 168 hours. No results for input(s): AMMONIA in the last 168 hours. CBC: Recent Labs  Lab 08/15/19 1042  08/15/19 1721 08/16/19 0505 08/16/19 0505 08/17/19 0501  WBC 14.3*   < > 10.3  --  10.0  NEUTROABS 10.5*  --   --   --   --   HGB 10.0*   < > 9.6*  --  9.2*  HCT 31.4*   < >  29.9*  --  29.2*  MCV 85.3   < > 87.4   < > 87.2  PLT 406*   < > 340  --  342   < > = values in this interval not displayed.   Cardiac Enzymes: No results for input(s): CKTOTAL, CKMB, CKMBINDEX, TROPONINI in the last 168 hours. BNP: Invalid input(s): POCBNP CBG: Recent Labs  Lab 08/17/19 0312 08/17/19 0714  GLUCAP 218* 198*       Disposition and Follow-up: Discharge Instructions    Increase activity slowly   Complete by: As directed        DISPOSITION: Home with home health   DISCHARGE FOLLOW-UP Follow-up Information    Frances Maywood, NP. Schedule an appointment as soon as possible for a visit in 2 week(s).   Specialty: Family Medicine Contact information: [address] [address] [phone]            Time coordinating discharge:  35 minutes  Signed:   Estill Cotta M.D. [facility] 08/17/2019, 12:03 PM

## 2019-08-17 NOTE — Progress Notes (Signed)
Discharge medication list and instructions reviewed. IV discontinued without complication. Wife at bedside to transport patient home for discharge. Vital signs stable. Voices no questions or concerns at this time.

## 2019-08-17 NOTE — Progress Notes (Signed)
Inpatient Diabetes Program Recommendations  AACE/ADA: New Consensus Statement on Inpatient Glycemic Control (2015)  Target Ranges:  Prepandial:   less than 140 mg/dL      Peak postprandial:   less than 180 mg/dL (1-2 hours)      Critically ill patients:  140 - 180 mg/dL   Lab Results  Component Value Date   GLUCAP 198 (H) 08/17/2019   HGBA1C 9.8 (H) 08/15/2019    Review of Glycemic Control Results for [name], [name] (MRN [id number]) as of 08/17/2019 09:05  Ref. Range 08/16/2019 07:52 08/16/2019 11:42 08/16/2019 16:22 08/16/2019 20:18 08/17/2019 03:12 08/17/2019 07:14  Glucose-Capillary Latest Ref Range: 70 - 99 mg/dL 220 (H) 242 (H) 188 (H) 251 (H) 218 (H) 198 (H)   Diabetes history: DM 2 Outpatient Diabetes medications: Glipizide 5 mg bid Current orders for Inpatient glycemic control:  Novolog 0-9 units tid  Levemir 8 units Daily  Inpatient Diabetes Program Recommendations:    -  Increase Levemir to 12 units.   Thanks,  Tama Headings RN, MSN, BC-ADM Inpatient Diabetes Coordinator Team Pager [phone] (8a-5p)

## 2019-08-17 NOTE — TOC Transition Note (Addendum)
Transition of Care [facility]) - CM/SW Discharge Note   Patient Details  Name: [name] MRN: [id number] Date of Birth: [birth year]  Transition of Care [facility]) CM/SW Contact:  Ross Ludwig, LCSW Phone Number: 08/17/2019, 11:22 AM   Clinical Narrative:     CSW was informed by bedside nurse, that patient needs a 4 wheeled walker with seat.  CSW asked physician to order.  CSW contacted Zach at [facility] to request DME.  Patient did not express any other needs or concerns.  CSW signing off for now, patient discharging back home.  Final next level of care: Home/Self Care Barriers to Discharge: Barriers Resolved   Patient Goals and CMS Choice Patient states their goals for this hospitalization and ongoing recovery are:: To return back home. CMS Medicare.gov Compare Post Acute Care list provided to:: Patient Choice offered to / list presented to : Patient  Discharge Placement  Patient discharging back home with wife.                  Discharge Plan and Services                DME Arranged: Walker rolling with seat DME Agency: [employer] Date DME Agency Contacted: 08/17/19 Time DME Agency Contacted: 1122 Representative spoke with at DME Agency: Thedore Mins HH Arranged: NA          Social Determinants of Health ([place]) Interventions     Readmission Risk Interventions No flowsheet data found.

## 2019-08-17 NOTE — TOC Progression Note (Signed)
Transition of Care [facility]) - Progression Note    Patient Details  Name: [name] MRN: [id number] Date of Birth: 08/17/1960  Transition of Care [facility]) CM/SW Contact  Ross Ludwig, [place] Phone Number: 08/17/2019, 4:21 PM  Clinical Narrative:     CSW was informed by physician that she received an email about patient's tube feedings need to be faxed to [facility] at [phone].  CSW faxed discharge summary, and orders to [place] received confirmation that fax has gone through at 3:44pm.   Barriers to Discharge: Barriers Resolved  Expected Discharge Plan and Services           Expected Discharge Date: 08/17/19               DME Arranged: Gilford Rile rolling with seat DME Agency: [employer] Date DME Agency Contacted: 08/17/19 Time DME Agency Contacted: 1122 Representative spoke with at DME Agency: [place]: NA           Social Determinants of Health ([place]) Interventions    Readmission Risk Interventions No flowsheet data found.

## 2019-08-21 ENCOUNTER — Encounter: Payer: Medicare PPO | Admitting: Nutrition

## 2019-08-21 ENCOUNTER — Encounter: Payer: Self-pay | Admitting: *Deleted

## 2019-08-21 ENCOUNTER — Ambulatory Visit: Payer: No Typology Code available for payment source | Admitting: Radiation Oncology

## 2019-08-21 ENCOUNTER — Other Ambulatory Visit: Payer: Self-pay | Admitting: *Deleted

## 2019-08-21 NOTE — Patient Outreach (Addendum)
[facility]) Care Management  08/21/2019  Tadarius Maland [facility] October 06, 1960 [id number]   Red on [place] discharge  Day # 1 Date : 08/19/19 Red Alert Reason :  Know who to call about changes in condition? No  Scheduled follow-up? No  Unfilled prescriptions? Yes  Able to fill today/tomorrow? No  Wounds healing well? No     Outreach Attempt #1 Subjective:  Successful outreach call to patient explained reason for the call and [facility] care management .Patient speech a little to understand, able to identify HIPAA identifiers. Patient wife also on found line and patient give verbal consent that we may speak with his wife, Coree Brame.   Explained post discharge interactive voice response call, addressed patient red alert reason :  1.Knows who to call about changes in condition?- Wife states they will call Primary provider Dr. Hoy Finlay, 2. Scheduled follow up ?- Has visit with PCP on 6/8 at [facility] in [place], 3/4/  Unfilled prescription?- Wife states that they were unable to get prescription filled at [facility] she reports being told patient did not have prescription benefit on [place].Wife states that she usually pays out of pocket for prescriptions she gets at [facility].  She states she spoke with [facility] nurse also this morning that encouraged her to speak with provider at [facility] visit regarding getting all prescriptions through the [place].  5. Wounds healing well ?- Wife states patient does not have wounds.   Social  Patient lives at home with his wife, that is able to assist as needed. Wife able to provide transportation to visits.  Patient independent with doing daily sponge bath. Patient has Gastrostomy tube for feeding, he has been shipped to patient home wife states that she assist patient wife feeding.   Conditions  Wife discussed patient history of squamous cell cancer of oral cavity surgery ,  Tracheostomy,  Gastrostomy tube placement . She discussed patient  had plans to begin  radiation therapy at [place] center until recent concerns related to high blood glucose at over 500 and hospital admission. She discussed recent ED visit at [facility] due to high blood sugar reading as well. She discussed that patient has monitor to check his blood sugar, states that he hasn't checked it today. Noted recent A1c 9.8% on 08/15/19 .   Medication  Wife discussed patient has new prescription for Levemir insulin at recent discharge,  she states unable  to get it filled at [facility], due to not having prescription benefit per her report.  She plans to discuss with PCP at [facility] at visit on tomorrow if she wants to continue plan and getting all prescriptions through [place]. Wife assist patient with organizing medications, she reports having all medications except insulin from discharge, she is agreeable to reviewing medication list at this time.    Appointments  Grant Fontana, NP at [facility] in [place]   Advanced Directive  Patient does not have advanced directive agreeable to receiving information .   Consent   Discussed with patient Hansel Feinstein [facility] care management services for providing education and support in managing chronic medical conditions, patient and wife in agreement .   Plan Will plan return call in the next 4 business day, continued assessment and goal setting.  Will send patient welcome letter, will send patient emmi education handout and  Advanced Directive packet, [facility] calendar.    Joylene Draft, RN, BSN  [place] Management Coordinator  [phone]- Mobile [phone]- Toll Free Main Office

## 2019-08-22 ENCOUNTER — Inpatient Hospital Stay: Payer: Medicare PPO | Admitting: Medical

## 2019-08-22 ENCOUNTER — Ambulatory Visit: Payer: No Typology Code available for payment source

## 2019-08-23 ENCOUNTER — Other Ambulatory Visit: Payer: Self-pay | Admitting: *Deleted

## 2019-08-23 ENCOUNTER — Inpatient Hospital Stay: Payer: Medicare PPO | Attending: Radiation Oncology | Admitting: Nutrition

## 2019-08-23 ENCOUNTER — Ambulatory Visit: Payer: No Typology Code available for payment source

## 2019-08-23 ENCOUNTER — Encounter: Payer: Self-pay | Admitting: Nutrition

## 2019-08-23 DIAGNOSIS — I2510 Atherosclerotic heart disease of native coronary artery without angina pectoris: Secondary | ICD-10-CM | POA: Insufficient documentation

## 2019-08-23 DIAGNOSIS — I252 Old myocardial infarction: Secondary | ICD-10-CM | POA: Insufficient documentation

## 2019-08-23 DIAGNOSIS — E440 Moderate protein-calorie malnutrition: Secondary | ICD-10-CM | POA: Diagnosis not present

## 2019-08-23 DIAGNOSIS — Z794 Long term (current) use of insulin: Secondary | ICD-10-CM | POA: Insufficient documentation

## 2019-08-23 DIAGNOSIS — Z79899 Other long term (current) drug therapy: Secondary | ICD-10-CM | POA: Insufficient documentation

## 2019-08-23 DIAGNOSIS — Z87891 Personal history of nicotine dependence: Secondary | ICD-10-CM | POA: Insufficient documentation

## 2019-08-23 DIAGNOSIS — R739 Hyperglycemia, unspecified: Secondary | ICD-10-CM | POA: Diagnosis not present

## 2019-08-23 DIAGNOSIS — Z8673 Personal history of transient ischemic attack (TIA), and cerebral infarction without residual deficits: Secondary | ICD-10-CM | POA: Insufficient documentation

## 2019-08-23 DIAGNOSIS — E11649 Type 2 diabetes mellitus with hypoglycemia without coma: Secondary | ICD-10-CM | POA: Insufficient documentation

## 2019-08-23 DIAGNOSIS — C040 Malignant neoplasm of anterior floor of mouth: Secondary | ICD-10-CM | POA: Insufficient documentation

## 2019-08-23 DIAGNOSIS — I10 Essential (primary) hypertension: Secondary | ICD-10-CM | POA: Insufficient documentation

## 2019-08-23 DIAGNOSIS — E875 Hyperkalemia: Secondary | ICD-10-CM | POA: Diagnosis not present

## 2019-08-23 DIAGNOSIS — E785 Hyperlipidemia, unspecified: Secondary | ICD-10-CM | POA: Insufficient documentation

## 2019-08-23 NOTE — Progress Notes (Signed)
Patient did not show up for nutrition appointment. 

## 2019-08-23 NOTE — Patient Outreach (Addendum)
[name]) Care Management  [place]  08/23/2019   [name] Nov 18, 1960 [id number]  [facility] Care Management  Follow up call -Transition of care  [name] Admission at [name] 6/1-08/17/19  Dx: Diabetes type 2 uncontrolled hyperglycemia glucose 520 on admission, hyperkalemia,  PMX:  Squamous cell carcinoma of oral activity s/p resection 06/08/19 with tracheostomy (decannulation on 08/03/19), PEG placement on 07/31/19, HTN, hyperlipidemia, depression, poorly controlled type 2 Diabetes, hemoglobin A1c 9.8% on 08/15/19      Subjective: Unsuccessful outreach call to patient, no answer able to leave a HIPAA compliant message for return call.  Placed call to patient wife contact number no answer unable to leave a message.    Plan Will plan return call in the next 4 days .   Joylene Draft, RN, BSN  [place] Management Coordinator  [phone]- Mobile [phone]- Toll Free Main Office

## 2019-08-24 ENCOUNTER — Ambulatory Visit: Payer: No Typology Code available for payment source

## 2019-08-25 ENCOUNTER — Ambulatory Visit: Payer: No Typology Code available for payment source

## 2019-08-25 DIAGNOSIS — C040 Malignant neoplasm of anterior floor of mouth: Secondary | ICD-10-CM | POA: Insufficient documentation

## 2019-08-25 DIAGNOSIS — Z794 Long term (current) use of insulin: Secondary | ICD-10-CM | POA: Insufficient documentation

## 2019-08-25 DIAGNOSIS — E1165 Type 2 diabetes mellitus with hyperglycemia: Secondary | ICD-10-CM | POA: Insufficient documentation

## 2019-08-25 DIAGNOSIS — Z510 Encounter for antineoplastic radiation therapy: Secondary | ICD-10-CM | POA: Diagnosis not present

## 2019-08-28 ENCOUNTER — Ambulatory Visit: Payer: No Typology Code available for payment source | Admitting: Radiation Oncology

## 2019-08-28 ENCOUNTER — Other Ambulatory Visit: Payer: Self-pay | Admitting: *Deleted

## 2019-08-28 NOTE — Patient Outreach (Addendum)
[facility]) Care Management  [place]  08/28/2019   Daniell Mancinas [facility] 12/27/1960 [id number]    Telephone Assessment   [facility] Care Management  Follow up call -Transition of care  Complex Care Management   Recent Hospital Admission at [facility] 6/1-08/17/19  Dx: Diabetes type 2 uncontrolled hyperglycemia glucose 520 on admission, hyperkalemia,  PMX:  Squamous cell carcinoma of oral activity s/p resection 06/08/19 with tracheostomy (decannulation on 08/03/19), PEG placement on 07/31/19, HTN, hyperlipidemia, depression, poorly controlled type 2 Diabetes, hemoglobin A1c 9.8% on 08/15/19   Subjective:  Successful outreach call to patient , he reports that he is doing about the same. He reports now having prescription for filled through the [place]. He placed wife on speaker phone, he reports patient now giving once daily.  He discussed that he is checking blood sugars at least twice daily, reading are staying in the 200's.  Patient report that he continues to use [employer] feeding about 5 times a day, and feels a little more satisfied than or prior feeding. He discussed hoping to feel stronger and gain some of weight loss back.  Patient and wife discussed patient to begin radiation therapy on this week.  Patient wife discussed that she is also caring her mother that has recently discharged from the hospital, that will have support of home health.   Encounter Medications:  Outpatient Encounter Medications as of 08/28/2019  Medication Sig  . Amino Acids-Protein Hydrolys (FEEDING SUPPLEMENT, PRO-STAT SUGAR FREE 64,) LIQD Place 30 mLs into feeding tube 2 (two) times daily.  Marland Kitchen aspirin EC 81 MG EC tablet Take 1 tablet (81 mg total) by mouth daily.  . clopidogrel (PLAVIX) 75 MG tablet Take 75 mg by mouth daily.  . cyclobenzaprine (FLEXERIL) 10 MG tablet Take 10 mg by mouth 3 (three) times daily as needed for muscle spasms.  Marland Kitchen glipiZIDE (GLUCOTROL) 5 MG tablet Take 5 mg by mouth 2  (two) times daily before a meal.  . insulin detemir (LEVEMIR) 100 UNIT/ML FlexPen Inject 12 Units into the skin daily. May substitute for Lantus FlexPen if Levemir is not available  . Insulin Pen Needle 32G X 4 MM MISC 12 Units by Does not apply route daily.  . magnesium oxide (MAG-OX) 400 MG tablet Take 400 mg by mouth 2 (two) times daily.  . metoprolol tartrate (LOPRESSOR) 50 MG tablet Take 50 mg by mouth in the morning and at bedtime.  . Nutritional Supplements (FEEDING SUPPLEMENT, KATE FARMS STANDARD 1.4,) LIQD liquid Place 325 mLs into feeding tube 5 (five) times daily.  . phenytoin (DILANTIN) 125 MG/5ML suspension Take 100 mg by mouth 3 (three) times daily.  . sodium fluoride (PREVIDENT 5000 PLUS) 1.1 % CREA dental cream Apply thin ribbon of cream to tooth brush. Brush teeth for 2 minutes. Spit out excess-DO NOT swallow. DO NOT rinse afterwards. Repeat nightly.  . valproic acid (DEPAKENE) 250 MG/5ML solution Take 500 mg by mouth in the morning and at bedtime.  . Water For Irrigation, Sterile (FREE WATER) SOLN Place 120 mLs into feeding tube 5 (five) times daily.   No facility-administered encounter medications on file as of 08/28/2019.    Functional Status:  In your present state of health, do you have any difficulty performing the following activities: 08/21/2019 08/15/2019  Hearing? N N  Vision? N Y  Comment - where's glassess all the time - at bedside  Difficulty concentrating or making decisions? N N  Walking or climbing stairs? Tempie Donning  Comment difficutly with stairs baseline  Dressing or bathing? N Y  Comment does self sponge baths baseline - wife assists  Doing errands, shopping? Y N  Comment wife drives to appointments wife drives  Conservation officer, nature and eating ? Y -  Comment has tubing feeding.wife assist with pureed diet as he tolerates/tires at times -  Using the Toilet? N -  In the past six months, have you accidently leaked urine? N -  Do you have problems with loss of bowel  control? N -  Managing your Medications? Y -  Comment wife assist -  Managing your Finances? Y -  Comment wife assist -  Housekeeping or managing your Housekeeping? Y -  Comment wife manages -  Some recent data might be hidden    Fall/Depression Screening: Fall Risk  08/21/2019 07/14/2017 07/12/2017  Falls in the past year? 0 No No  Number falls in past yr: 0 - -   PHQ 2/9 Scores 08/28/2019 07/14/2017 07/12/2017  PHQ - 2 Score 4 0 0  PHQ- 9 Score 13 - -      Food Insecurity: No Food Insecurity  . Worried About Charity fundraiser in the Last Year: Never true  . Ran Out of Food in the Last Year: Never true     Transportation Needs: No Transportation Needs  . Lack of Transportation (Medical): No  . Lack of Transportation (Non-Medical): No     Financial Resource Strain: Low Risk   . Difficulty of Paying Living Expenses: Not hard at all   Assessment:   Diabetes Will benefit from ongoing diabetes  Self care management support, A1c 9.8.  Patient in now on daily Lantus insulin, he denies low blood sugar episodes.   Squamous cell carcinoma of oral cavity  Patient has appointment at [place] to begin radiation therapy. He has follow up with Oncology PA as well as nutrition during treatment plan.    Plan:  Will plan return call in the next week, for completion of assessment .    THN CM Care Plan Problem One     Most Recent Value  Care Plan Problem One At risk for readmission related to hyperglycemia due to recent hospital discharge   Role Documenting the Problem One Care Management [place] for Problem One Active  THN Long Term Goal  Over the next 30 days patient will verbalize improvement of self care management of Diabetes.   THN Long Term Goal Start Date 08/28/19  Interventions for Problem One Long Term Goal Advised regarding importance of keeping all medical appointment, taking medications as prescribed.. Reviewed his recent A1c and control levels   THN  CM Short Term Goal #1  Over the next 30 days patient will monitor and log blood sugars at least 2 times daily   THN CM Short Term Goal #1 Start Date 08/28/19  Interventions for Short Term Goal #1 Discussed with patient importance of knowing what blood sugar readings are ,review of blood sugar low and high ranges. Encouraged to take blood sugar meter to visit .    THN CM Short Term Goal #2  Over the next 30 days patient will be able to report improvement in nutrition state with  weight gain of 2 pounds.   THN CM Short Term Goal #2 Start Date 08/28/19  Interventions for Short Term Goal #2 Encouraged continued tube feedings as recommended , reinforced attending nutrition visits.       Joylene Draft, RN, BSN  [facility]  Care Management,Care Management Coordinator  [phone]- Mobile [phone]- Toll Free Main Office

## 2019-08-29 ENCOUNTER — Ambulatory Visit: Payer: No Typology Code available for payment source

## 2019-08-30 ENCOUNTER — Ambulatory Visit
Admission: RE | Admit: 2019-08-30 | Discharge: 2019-08-30 | Disposition: A | Discharge status: Home / Self Care | Payer: No Typology Code available for payment source | Source: Ambulatory Visit | Attending: Radiation Oncology | Admitting: Radiation Oncology

## 2019-08-30 ENCOUNTER — Telehealth: Payer: Self-pay | Admitting: Medical

## 2019-08-30 ENCOUNTER — Inpatient Hospital Stay: Payer: Medicare PPO | Admitting: Medical

## 2019-08-30 ENCOUNTER — Other Ambulatory Visit: Payer: Self-pay

## 2019-08-30 VITALS — BP 136/77 | HR 97 | Temp 97.8°F | Resp 18 | Ht 70.0 in | Wt 128.3 lb

## 2019-08-30 DIAGNOSIS — Z794 Long term (current) use of insulin: Secondary | ICD-10-CM | POA: Diagnosis not present

## 2019-08-30 DIAGNOSIS — C040 Malignant neoplasm of anterior floor of mouth: Secondary | ICD-10-CM | POA: Diagnosis not present

## 2019-08-30 DIAGNOSIS — Z8673 Personal history of transient ischemic attack (TIA), and cerebral infarction without residual deficits: Secondary | ICD-10-CM | POA: Diagnosis not present

## 2019-08-30 DIAGNOSIS — E11649 Type 2 diabetes mellitus with hypoglycemia without coma: Secondary | ICD-10-CM | POA: Diagnosis not present

## 2019-08-30 DIAGNOSIS — E088 Diabetes mellitus due to underlying condition with unspecified complications: Secondary | ICD-10-CM

## 2019-08-30 DIAGNOSIS — I252 Old myocardial infarction: Secondary | ICD-10-CM | POA: Diagnosis not present

## 2019-08-30 DIAGNOSIS — I2510 Atherosclerotic heart disease of native coronary artery without angina pectoris: Secondary | ICD-10-CM | POA: Diagnosis not present

## 2019-08-30 DIAGNOSIS — E0865 Diabetes mellitus due to underlying condition with hyperglycemia: Secondary | ICD-10-CM

## 2019-08-30 DIAGNOSIS — IMO0002 Reserved for concepts with insufficient information to code with codable children: Secondary | ICD-10-CM

## 2019-08-30 DIAGNOSIS — R569 Unspecified convulsions: Secondary | ICD-10-CM | POA: Diagnosis not present

## 2019-08-30 DIAGNOSIS — Z510 Encounter for antineoplastic radiation therapy: Secondary | ICD-10-CM | POA: Diagnosis not present

## 2019-08-30 DIAGNOSIS — E1165 Type 2 diabetes mellitus with hyperglycemia: Secondary | ICD-10-CM | POA: Diagnosis not present

## 2019-08-30 DIAGNOSIS — I10 Essential (primary) hypertension: Secondary | ICD-10-CM | POA: Diagnosis not present

## 2019-08-30 DIAGNOSIS — E785 Hyperlipidemia, unspecified: Secondary | ICD-10-CM | POA: Diagnosis not present

## 2019-08-30 DIAGNOSIS — Z87891 Personal history of nicotine dependence: Secondary | ICD-10-CM | POA: Diagnosis not present

## 2019-08-30 DIAGNOSIS — Z79899 Other long term (current) drug therapy: Secondary | ICD-10-CM | POA: Diagnosis not present

## 2019-08-30 LAB — GLUCOSE, CAPILLARY: Glucose-Capillary: 320 mg/dL — ABNORMAL HIGH (ref 70–99)

## 2019-08-30 MED ORDER — PHENYTOIN 125 MG/5ML PO SUSP: 100.0000 mg | Freq: Three times a day (TID) | ORAL | 2 refills | Status: AC

## 2019-08-30 MED ORDER — VALPROIC ACID 250 MG/5ML PO SOLN
500.0000 mg | Freq: Two times a day (BID) | ORAL | 2 refills | Status: AC
Start: 2019-08-30 — End: ?

## 2019-08-30 MED FILL — VALPROIC ACID 250 MG/5 ML S: 250 | 30 days supply | Qty: 600 | Fill #0

## 2019-08-30 NOTE — Progress Notes (Signed)
Blood sugar taken 0912: 320.  PA Lucianne Lei made aware.

## 2019-08-30 NOTE — Telephone Encounter (Signed)
Scheduled per 06/16 los, patient has received updated calender.

## 2019-08-30 NOTE — Progress Notes (Signed)
Oncology Nurse Navigator Documentation  To provide support, encouragement and care continuity, met with [name] for his initial RT.  He was accompanied by his wife.  I reviewed the 2-step treatment process, answered questions.   Mr. Clason completed treatment without difficulty, denied questions/concerns.  I provided PEG care and applied a new dressing.   I reviewed the registration/arrival procedure for subsequent treatments.  I encouraged them to call me with questions/concerns as tmts proceed.  Harlow Asa RN, BSN, OCN Head & Neck Oncology Nurse [place] at [facility] Phone # [phone]  Fax # [phone]

## 2019-08-30 NOTE — Patient Instructions (Signed)

## 2019-08-31 ENCOUNTER — Ambulatory Visit
Admission: RE | Admit: 2019-08-31 | Discharge: 2019-08-31 | Disposition: A | Discharge status: Home / Self Care | Payer: No Typology Code available for payment source | Source: Ambulatory Visit | Attending: Radiation Oncology | Admitting: Radiation Oncology

## 2019-08-31 ENCOUNTER — Other Ambulatory Visit: Payer: Self-pay

## 2019-08-31 ENCOUNTER — Ambulatory Visit: Payer: No Typology Code available for payment source | Admitting: Physical Therapy

## 2019-08-31 ENCOUNTER — Ambulatory Visit: Payer: No Typology Code available for payment source | Attending: Radiation Oncology

## 2019-08-31 ENCOUNTER — Encounter: Payer: Self-pay | Admitting: General Practice

## 2019-08-31 DIAGNOSIS — R1310 Dysphagia, unspecified: Secondary | ICD-10-CM | POA: Diagnosis not present

## 2019-08-31 DIAGNOSIS — Z8673 Personal history of transient ischemic attack (TIA), and cerebral infarction without residual deficits: Secondary | ICD-10-CM | POA: Diagnosis not present

## 2019-08-31 DIAGNOSIS — E1165 Type 2 diabetes mellitus with hyperglycemia: Secondary | ICD-10-CM | POA: Diagnosis not present

## 2019-08-31 DIAGNOSIS — C040 Malignant neoplasm of anterior floor of mouth: Secondary | ICD-10-CM | POA: Diagnosis not present

## 2019-08-31 DIAGNOSIS — I10 Essential (primary) hypertension: Secondary | ICD-10-CM | POA: Diagnosis not present

## 2019-08-31 DIAGNOSIS — I252 Old myocardial infarction: Secondary | ICD-10-CM | POA: Diagnosis not present

## 2019-08-31 DIAGNOSIS — R4781 Slurred speech: Secondary | ICD-10-CM | POA: Insufficient documentation

## 2019-08-31 DIAGNOSIS — Z794 Long term (current) use of insulin: Secondary | ICD-10-CM | POA: Diagnosis not present

## 2019-08-31 DIAGNOSIS — E785 Hyperlipidemia, unspecified: Secondary | ICD-10-CM | POA: Diagnosis not present

## 2019-08-31 DIAGNOSIS — R1312 Dysphagia, oropharyngeal phase: Secondary | ICD-10-CM | POA: Insufficient documentation

## 2019-08-31 DIAGNOSIS — I2510 Atherosclerotic heart disease of native coronary artery without angina pectoris: Secondary | ICD-10-CM | POA: Diagnosis not present

## 2019-08-31 DIAGNOSIS — E11649 Type 2 diabetes mellitus with hypoglycemia without coma: Secondary | ICD-10-CM | POA: Diagnosis not present

## 2019-08-31 DIAGNOSIS — Z510 Encounter for antineoplastic radiation therapy: Secondary | ICD-10-CM | POA: Diagnosis not present

## 2019-08-31 DIAGNOSIS — Z87891 Personal history of nicotine dependence: Secondary | ICD-10-CM | POA: Diagnosis not present

## 2019-08-31 DIAGNOSIS — C069 Malignant neoplasm of mouth, unspecified: Secondary | ICD-10-CM | POA: Diagnosis not present

## 2019-08-31 MED FILL — PHENYTOIN 125 MG/5ML SUSP: 125 | 19 days supply | Qty: 237 | Fill #0

## 2019-08-31 NOTE — Therapy (Signed)
[facility] [address] [place], [place], [address] Phone: [phone]   Fax:  [phone]  Speech Language Pathology Evaluation  Patient Details  Name: [name] MRN: [id number] Date of Birth: 09/26/1960 Referring Provider (SLP): Eppie Gibson, MD   Encounter Date: 08/31/2019   End of Session - 08/31/19 1239    Visit Number 1    Number of Visits 7    Date for SLP Re-Evaluation 11/29/19   90 days   SLP Start Time 0855    SLP Stop Time  0940    SLP Time Calculation (min) 45 min    Activity Tolerance Patient tolerated treatment well           Past Medical History:  Diagnosis Date  . Anxiety   . Cancer ([place])    mouth  . Chronic pancreatitis ([place])   . Coronary artery disease   . Depression   . Diabetes mellitus without complication ([place])   . Hyperlipidemia   . Hypertension   . Myocardial infarction ([place])   . Neuromuscular disorder ([place])   . Sciatica   . Seizures ([place])   . Stroke ([place])   . Substance abuse [facility])     Past Surgical History:  Procedure Laterality Date  . BACK SURGERY    . CORONARY STENT PLACEMENT    . GASTROSTOMY TUBE PLACEMENT Left 07/31/2019   [facility]  . LEFT HEART CATHETERIZATION WITH CORONARY ANGIOGRAM N/A 10/01/2011   Procedure: LEFT HEART CATHETERIZATION WITH CORONARY ANGIOGRAM;  Surgeon: Clent Demark, MD;  Location: [facility] CATH LAB;  Service: Cardiovascular;  Laterality: N/A;  . LEFT HEART CATHETERIZATION WITH CORONARY ANGIOGRAM N/A 03/06/2013   Procedure: LEFT HEART CATHETERIZATION WITH CORONARY ANGIOGRAM;  Surgeon: Clent Demark, MD;  Location: [facility] CATH LAB;  Service: Cardiovascular;  Laterality: N/A;  . PERCUTANEOUS CORONARY STENT INTERVENTION (PCI-S) N/A 10/02/2011   Procedure: PERCUTANEOUS CORONARY STENT INTERVENTION (PCI-S);  Surgeon: Clent Demark, MD;  Location[facility] LAB;  Service: Cardiovascular;  Laterality: N/A;  . TRACHEOSTOMY CLOSURE      There were no  vitals filed for this visit.   Subjective Assessment - 08/31/19 0904    Subjective Pt intelligibilty decr'd for this trained listener, with mask on.    Patient is accompained by: Family member   wife   Currently in Pain? 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 CT neck 05-15-19 enlarged rt lymph nodes- referred to [place] who ID'd floor of mouth lesion and recommened sx. Surgery with Dr. Hendricks Limes 06-08-19 floor of mouth resection with partial glossectomy, bil neck dissection tracheostomy, mandibulectomy, plating, hyoid suspension bilaterally, left free flap scapular. Pt has PEG placed 07-31-19.  Prior Functional Status   Cognitive/Linguistic Baseline Within functional limits      Cognition   Overall Cognitive Status Within Functional Limits for tasks assessed      Oral Motor/Sensory Function   Overall Oral Motor/Sensory Function Impaired    Labial ROM Reduced left    Labial Symmetry Abnormal symmetry left    Labial Strength Reduced    Labial Coordination Reduced    Lingual Symmetry Abnormal symmetry right;Abnormal symmetry left    Lingual Strength --   Unaable to test due to    Lingual Sensation Reduced   "the tip where he cut it out"   Lingual Coordination Reduced    Overall Oral Motor/Sensory Function Surgical changes to oral cavity appreciated. Pt sounded like he was holding saliva in pharyngeal cavity.  Mod amount of oral saliva noted, whitish. SLP asked pt to swallow with effort and minmal change.       Motor Speech   Overall Motor Speech Impaired    Articulation Impaired    Level of Impairment Word    Intelligibility Intelligibility reduced    Conversation 50-74% accurate   approx 75%   Effective Techniques Pacing;Slow rate             Because data states the risk for dysphagia during and after radiation treatment is high due to undergoing radiation tx, SLP taught pt about the possibility of reduced/limited ability for PO intake during rad tx. SLP encouraged pt to continue swallowing POs as far into rad tx as possible, even ingesting POs and/or completing HEP shortly after administration of pain meds. Among other modifications for days when pt cannot functionally swallow.  SLP educated pt re: changes to swallowing musculature after rad tx, and why adherence to dysphagia HEP provided today and PO consumption was necessary to inhibit muscular disuse atrophy and to reduce muscle fibrosis following rad tx. Pt demonstrated understanding of these things to SLP.    SLP then developed a HEP for pt and pt was instructed how to perform exercises involving lingual, vocal, and pharyngeal strengthening. SLP performed each exercise and pt return demonstrated each exercise. SLP ensured pt performance was correct prior to moving on to next exercise. Pt was instructed to complete this program 2-3 times a day, until 6 months after his or her last rad tx, then x2-3 a week after that.                SLP Education - 08/31/19 1235    Education Details oral care BID, HEP procedure, late effects head/neck radiation, swallow precautions from [facility], need to have POs    Person(s) Educated Patient;Spouse    Methods Explanation;Demonstration;Verbal cues;Handout    Comprehension Verbalized understanding;Returned demonstration;Verbal cues required;Need further instruction            SLP Short Term  Goals - 08/31/19 1304      SLP SHORT TERM GOAL #1   Title pt will complete HEP with occasional min A    Time 2    Period --   sessions, for all STGs   Status New      SLP SHORT TERM GOAL #2   Title pt will follow swallow precautions with POs with rare min A    Time 2    Status New      SLP SHORT TERM GOAL #3   Title pt will tell SLP why oral hygiene is important    Time 1    Status New      SLP  SHORT TERM GOAL #4   Title pt will tell SLP why he is doing exercises, and why it is important to have daily PO in two sessions, with modified independence    Time 2    Status New            SLP Long Term Goals - 08/31/19 1306      SLP LONG TERM GOAL #1   Title pt will tell SLP why he is doing exercises, and why it is important to have daily PO in four sessions, with modified independence    Time 5    Period --   sessions, for all LTGs   Status New      SLP LONG TERM GOAL #2   Title pt will complete HEP with modifeid independence in 2 sessions    Time 4    Status New      SLP LONG TERM GOAL #3   Title pt will tell SLP 3 overt s/sx aspiration PNA with modified independence over 2 sessions    Time 5    Status New      SLP LONG TERM GOAL #4   Title pt will tell SLP when to reduce frequency of HEP    Time 6    Status New            Plan - 08/31/19 1241    Clinical Impression Statement Pt is PEG dependent since Jul 31, 2019. Presents today with FEES completed at [facility] 08-09-19 - moderate oral and mild-moderate pharyngeal dysphagia (improved from [facility] on 07-25-19).Oral stage is marked by reduced labial seal, decreased lingual ROM and strength, and diminished oral sensation. Deficits result in mild anterior loss of thin liquid and puree that does not progress past mid chin, reduced A/P transit, prolonged oral preparatory time, and post-swallow oral residual. Straw sips of thin liquid, posterior placement of puree, and alternating puree/thin liquid are effective in improving A/P  transit and minimizing oral residual. Recommended primary nutrition and hydration with PEG, but pureed foods with thin liquids were encouraged however pt has not had any pureed food and only  minimal thin liquid attempts. SLP strongly encouraged pt to have something x3/day with the swallow precautions (sit upright, alternate solid/liquid, multiple swallows). Today, pt took puree and thin with SLP-recommended smaller bites and teaspoon sips - pt endoresed having tried liquids with larger sips and feeling "like I was gonna choke". After two bites of applesauce and two teaspoons of water pt's voice was heard more clearly and oral saliva had disappeared.Data indicate that pt's swallow ability will likely decrease over the course of radiation therapy and could very well decline over time following conclusion of their radiation therapy due to muscle disuse atrophy and/or muscle fibrosis. Pt will cont to need to be seen by SLP in order to assess safety of PO intake, assess the need for recommending any objective swallow assessment, and ensuring pt correctly completes the individualized HEP.    Speech Therapy Frequency --   once approx every 4 weeks   Duration --   6 visits; 7 total sessions   Treatment/Interventions Aspiration precaution training;Pharyngeal strengthening exercises;Diet toleration management by SLP;Trials of upgraded texture/liquids;Internal/external aids;Patient/family education;SLP instruction and feedback    Potential to Achieve Goals Fair    Potential Considerations Severity of impairments    SLP Home Exercise Plan provided today    Consulted and Agree with Plan of Care Patient           Patient will benefit  from skilled therapeutic intervention in order to improve the following deficits and impairments:   Dysphagia, oropharyngeal phase  Slurred speech    Problem List Patient Active Problem List   Diagnosis Date Noted  . Malnutrition of moderate degree 08/16/2019  . Hyperkalemia  08/15/2019  . Hyperglycemia 08/15/2019  . SIRS (systemic inflammatory response syndrome) ([place]) 08/15/2019  . AKI (acute kidney injury) ([place]) 08/15/2019  . Oral candidiasis 08/15/2019  . Hypercalcemia 08/15/2019  . 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 ([place]) 08/08/2019  . Schizophrenia spectrum disorder with psychotic disorder type not yet determined ([place]) 09/24/2015  . Intermittent explosive disorder 09/20/2015  . Severe recurrent major depressive disorder with psychotic features ([place]) 09/20/2015  . Suicidal ideation 09/20/2015  . Homicidal ideation 09/20/2015  . Chest pain at rest 03/19/2013  . Acute non Q wave MI (myocardial infarction), initial episode of care ([place]) 03/06/2013  . Chronic sciatica of left side 09/10/2012  . Personality disorder ([place]) 11/10/2011  . Alcohol use disorder, mild, abuse 10/01/2011  . NSTEMI (non-ST elevated myocardial infarction) ([place]) 10/01/2011  . Erectile dysfunction 08/01/2010  . Urge incontinence of urine 08/01/2010  . Chronic alcoholic pancreatitis ([place]) 07/24/2010  . Tobacco use disorder 07/24/2010  . Hypertension 07/24/2010  . Hyperlipidemia 07/24/2010  . CVA (cerebral vascular accident) ([place]) 07/24/2010  . Chronic back pain 07/24/2010    [facility] ,MS, CCC-SLP  08/31/2019, 1:19 PM  [facility] [address] [place], [place], [address] Phone: [phone]   Fax:  [phone]  Name: [name] MRN: [id number] Date of Birth: 1960/05/30

## 2019-08-31 NOTE — Patient Instructions (Signed)
° °  You need to complete oral care at least twice each day, and after you eat something.    SWALLOWING EXERCISES Do these until 6 months after your last day of radiation, then 2-3 times per week afterwards  1. Effortful Swallows - Press your tongue against the roof of your mouth for 3 seconds, then squeeze the muscles in your neck while you swallow your saliva or a sip of water - Repeat 10-15 times, 2-3 times a day, and use whenever you eat or drink  2. Pitch Raise - Repeat he, once per second in a high pitch - low pitch- high pitch pattern - Repeat 20 times, 2-3 times a day  3. Mendelsohn Maneuver - half swallow exercise - Start to swallow, and keep your Adams apple up by squeezing hard with the muscles of the throat - Hold the squeeze for 5-7 seconds and then relax - Repeat 10-15 times, 2-3 times a day *use a wet spoon if your mouth gets dry*

## 2019-08-31 NOTE — Progress Notes (Signed)
[place] [facility] [place] Work Visit  Met w patient and wife [name] in exam room during clinic.  Patient in wheelchair w cane available.  Patient diagnosed w mouth cancer Jan 2021, has had surgery and will now undergo a course of radiation treatments.  He has had multiple teeth extracted - is somewhat difficult to understand. This is a source of frustration for him as it makes communication difficult for him. CSW and patient discussed common feeling and emotions when being diagnosed with and treated for cancer, and the importance of support during treatment. CSW informed patient of the support team and support services at [facility]. CSW provided contact information and encouraged patient to call with any questions or concerns.  Provided supportive listening and support for patient and wife.  This has been a challenging experience.  Patient used to be active and particularly enjoyed bowling.  This provided both activity and socialization.  Now he is unable to participate.  Per both patient and wife, he can become quite frustrated when he is unable to make himself understood.  Does not like to use whiteboard to communicate as he tends to write quickly when frustrated - neither his speech nor his writing are easily understood.  Wife tries to encourage him to do things needed for his cancer treatment - for example, encouraging hydration.  He does not understand the value of hydration and becomes frustrated when encouraged to "drink more."  His father in law died earlier this year - "he was really the only father figure he had" per wife.  In addition, wife's mother now lives w them - she requires a significant amount of personal care as she has difficulty walking.  Patient is also frustrated by his continued high blood sugar readings - he is aware of his diabetes and feels it has been more difficult to manage while in treatment.  He expresses frustration at not being able to eat as he used to be able to.  Misses the taste/texture of foods such as chicken - now gets all his nutrition through his feeding tube.  He reports that he experiences hunger pangs which are not resolved by tube feedings, further reinforcing the fact that he cannot eat "normal food" like he used to.  Discussed the feelings of fear and sadness which often underlie anger.  Patient became tearful when this was acknowledged. He has lost multiple things including teeth, independence, activities he used to enjoy, a feeling of being able to care for himself.  Since diagnosis, he has become dependent on others for his care and life feels "out of control."  Discussed things he can control vs those he cannot control - encouraged options for regaining a sense of control when he starts to become angry/tense.  This is when he becomes short w wife, frustrated and similar.  Provided information on Support Center activities and encouraged him to contact us as needed throughout treatment for support/resources.  Edwyna Shell, LCSW Clinical Social Worker Phone:  [phone] Cell:  [phone]

## 2019-08-31 NOTE — Progress Notes (Signed)
Oncology Nurse Navigator Documentation  I met with patient after his second radiation appointment today and accompanied him to his appointment with Garald Balding SLP. I provided Mrs. Weinberg with a calendar of upcoming appointments. She is aware that she can call me with a future questions or concerns.   Harlow Asa RN, BSN, OCN Head & Neck Oncology Nurse [place] at [facility] Phone # [phone]  Fax # [phone]

## 2019-08-31 NOTE — Progress Notes (Signed)
Sympt[facility] Management Clinic Progress Note   [name] [id number] [birth year] 59 y.o.  [name] is managed by Dr. 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  Diabetes with poor glycemic control: A fingerstick glucose returned today at 320.  The patient has been instructed to increase his Levemir to 14 units daily.  He will be seen next week with repeat labs.  Hemoglobin A1c will be checked on his return.  The patient and his wife are concerned regarding weekly follow-ups with this provider as they have a $50 co-pay.  He was referred to our financial counselor today to discuss this issue.  Please see After Visit Summary for patient specific instructions.  Future Appointments  Date Time Provider [place]  09/01/2019  9:00 AM [facility] [id number] [facility] None  09/01/2019  9:00 AM Karie Mainland, RD [facility] None  09/04/2019  8:15 AM [facility] [id number] [facility] None  09/05/2019 11:45 AM [facility] LINAC 3 [facility] None  09/06/2019  9:00 AM [facility] [id number] [facility] None  09/06/2019  3:15 PM Alfonzo Feller, RN [facility] None  09/07/2019 11:15 AM [facility] [id number] [facility] None  09/07/2019 12:30 PM [facility] LAB 4 [facility] None  09/07/2019  1:00 PM Yisroel Mullendore E., PA-C [facility] None  09/08/2019 10:45 AM [facility] [id number] [facility] None  09/08/2019 11:15 AM Neff, Barbara L, RD [facility] None  09/11/2019  8:15 AM [facility] [id number] [facility] None  09/12/2019 10:00 AM [facility] [id number] [facility] None  09/13/2019  9:00 AM Manfred Laspina E., PA-C [facility] None  09/13/2019 10:00 AM [facility] [id number] [facility] None   09/14/2019  8:30 AM [facility] [id number] [facility] None  09/15/2019  8:30 AM [facility] [id number] [facility] None  09/15/2019  9:00 AM Neff, Barbara L, RD [facility] None  09/19/2019  8:30 AM [facility] [id number] [facility] None  09/20/2019  8:30 AM [facility] [id number] [facility] None  09/20/2019  9:00 AM Mal Asher E., PA-C [facility] None  09/21/2019  8:30 AM [facility] [id number] [facility] None  09/22/2019  8:30 AM [facility] [id number] [facility] None  09/22/2019  9:00 AM Neff, Barbara L, RD [facility] None  09/25/2019  8:30 AM [facility] [id number] [facility] None  09/26/2019  8:30 AM [facility] [id number] [facility] None  09/27/2019  8:30 AM [facility] [id number] [facility] None  09/27/2019  9:00 AM Aengus Sauceda, Lucianne Lei E., PA-C [facility] None  10/03/2019  9:00 AM Jennet Maduro, RD [facility] None  10/11/2019  9:00 AM Karie Mainland, RD [facility] None    Orders Placed This Encounter  Procedures  . Glucose, capillary       Subjective:   Patient ID:  [name] is a 59 y.o. (DOB [birth year]) male.  Chief Complaint:  Chief Complaint  Patient presents with  . Symptom Management of Radiation Patient    HPI [name]  is a 59 y.o. male with a diagnosis of a cancer of the floor of the mouth.  He is currently treated with daily radiation therapy under the care of Dr. Isidore Ramos. His final treatment plan for 09/27/2019.  He presents to the clinic today with his wife.  He continues to have episodes of hypoglycemia with his blood sugar noted to be 320 today.  He is currently on Levemir 12 units daily.  He  is using a feeding tube for 100% of his intake.  He reports shortness of breath, dyspnea on exertion, and intermittent dizziness.  He is feeding with his tube 5 times daily.  He is in taking approximately 100 ounces of free water with each of his feedings.  He is getting in approximately 500 mL of water daily which unfortunately is only equivalent to approximately 16.6 ounces  of fluid daily.  He is agreeable to try to increase this to around 50 mL of fluid daily.  Medications: I have reviewed the patient's current medications.  Allergies:  Allergies  Allergen Reactions  . Morphine And Related Itching    Past Medical History:  Diagnosis Date  . Anxiety   . Cancer ([place])    mouth  . Chronic pancreatitis ([place])   . Coronary artery disease   . Depression   . Diabetes mellitus without complication ([place])   . Hyperlipidemia   . Hypertension   . Myocardial infarction ([place])   . Neuromuscular disorder ([place])   . Sciatica   . Seizures ([place])   . Stroke ([place])   . Substance abuse [facility])     Past Surgical History:  Procedure Laterality Date  . BACK SURGERY    . CORONARY STENT PLACEMENT    . GASTROSTOMY TUBE PLACEMENT Left 07/31/2019   [facility]  . LEFT HEART CATHETERIZATION WITH CORONARY ANGIOGRAM N/A 10/01/2011   Procedure: LEFT HEART CATHETERIZATION WITH CORONARY ANGIOGRAM;  Surgeon: Clent Demark, MD;  Location: [facility] CATH LAB;  Service: Cardiovascular;  Laterality: N/A;  . LEFT HEART CATHETERIZATION WITH CORONARY ANGIOGRAM N/A 03/06/2013   Procedure: LEFT HEART CATHETERIZATION WITH CORONARY ANGIOGRAM;  Surgeon: Clent Demark, MD;  Location: [place] CATH LAB;  Service: Cardiovascular;  Laterality: N/A;  . PERCUTANEOUS CORONARY STENT INTERVENTION (PCI-S) N/A 10/02/2011   Procedure: PERCUTANEOUS CORONARY STENT INTERVENTION (PCI-S);  Surgeon: Clent Demark, MD;  Location: [facility] CATH LAB;  Service: Cardiovascular;  Laterality: N/A;  . TRACHEOSTOMY CLOSURE      Family History  Family history unknown: Yes    Social History   Socioeconomic History  . Marital status: Married    Spouse name: Not on file  . Number of children: 1  . Years of education: Not on file  . Highest education level: Not on file  Occupational History  . Not on file  Tobacco Use  . Smoking status: Former Smoker    Packs/day: 1.00    Years: 40.00    Pack years: 40.00    Types: Cigarettes  .  Smokeless tobacco: Never Used  Vaping Use  . Vaping Use: Never used  Substance and Sexual Activity  . Alcohol use: Not Currently  . Drug use: Never  . Sexual activity: Not on file  Other Topics Concern  . Not on file  Social History Narrative   The patient is married. The patient has a 73 year old daughter.  He is currently unemployed, and  has a high school degree.        He has a history of 1- 1.5 pack per day smoking of many years.  He states he has quit smoking (07/2019)      He has a history of heavy alcohol use, states typically drinking a fifth to a pint of gin daily, after stroke in 06/2010, decreased to one pint of gin per week, and after hospitalization for acute on chronic pancreatitis, was abstinent for at least 1-2 weeks (from 5/7 to 5/17)        He  denies any illicit drug use.    Social Determinants of Health   Financial Resource Strain: Low Risk   . Difficulty of Paying Living Expenses: Not hard at all  Food Insecurity: No Food Insecurity  . Worried About Charity fundraiser in the Last Year: Never true  . Ran Out of Food in the Last Year: Never true  Transportation Needs: No Transportation Needs  . Lack of Transportation (Medical): No  . Lack of Transportation (Non-Medical): No  Physical Activity:   . Days of Exercise per Week:   . Minutes of Exercise per Session:   Stress:   . Feeling of Stress :   Social Connections:   . Frequency of Communication with Friends and Family:   . Frequency of Social Gatherings with Friends and Family:   . Attends Religious Services:   . Active Member of Clubs or Organizations:   . Attends Archivist Meetings:   Marland Kitchen Marital Status:   Intimate Partner Violence:   . Fear of Current or Ex-Partner:   . Emotionally Abused:   Marland Kitchen Physically Abused:   . Sexually Abused:     Past Medical History, Surgical history, Social history, and Family history were reviewed and updated as appropriate.   Please see review of systems for  further details on the patient's review from today.   Review of Systems:  Review of Systems  Constitutional: Positive for fatigue. Negative for chills, diaphoresis and fever.  HENT: Positive for trouble swallowing and voice change.   Respiratory: Positive for shortness of breath. Negative for cough, chest tightness and wheezing.   Cardiovascular: Negative for chest pain and palpitations.  Gastrointestinal: Negative for abdominal pain, constipation, diarrhea, nausea and vomiting.  Musculoskeletal: Negative for back pain and myalgias.  Neurological: Positive for dizziness. Negative for light-headedness and headaches.    Objective:   Physical Exam:  BP 136/77 (BP Location: Left Arm, Patient Position: Sitting)   Pulse 97   Temp 97.8 F (36.6 C) (Temporal)   Resp 18   Ht 5\' 10"  (1.778 m)   Wt 128 lb 4.8 oz (58.2 kg)   SpO2 100%   BMI 18.41 kg/m  ECOG: 1  Physical Exam Constitutional:      General: He is not in acute distress.    Appearance: Normal appearance. He is not ill-appearing.  HENT:     Head: Normocephalic and atraumatic.     Mouth/Throat:     Mouth: Mucous membranes are moist.     Pharynx: Oropharynx is clear.  Eyes:     General: No scleral icterus.       Right eye: No discharge.        Left eye: No discharge.  Cardiovascular:     Rate and Rhythm: Normal rate and regular rhythm.     Heart sounds: No murmur heard.  No friction rub. No gallop.   Pulmonary:     Effort: Pulmonary effort is normal. No respiratory distress.     Breath sounds: Normal breath sounds. No wheezing, rhonchi or rales.  Abdominal:     General: Bowel sounds are normal. There is no distension.     Palpations: There is no mass.     Tenderness: There is no abdominal tenderness. There is no guarding.    Skin:    General: Skin is warm and dry.     Coloration: Skin is not jaundiced.     Findings: No bruising, erythema or lesion.  Neurological:     Mental Status: He  is alert.      Coordination: Coordination normal.     Gait: Gait normal.     Lab Review:     Component Value Date/Time   NA 134 (L) 08/17/2019 0501   K 5.0 08/17/2019 0501   CL 99 08/17/2019 0501   CO2 22 08/17/2019 0501   GLUCOSE 189 (H) 08/17/2019 0501   BUN 28 (H) 08/17/2019 0501   CREATININE 0.56 (L) 08/17/2019 0501   CREATININE 1.25 (H) 08/15/2019 1042   CALCIUM 9.4 08/17/2019 0501   PROT 8.7 (H) 08/15/2019 1042   ALBUMIN 3.4 (L) 08/15/2019 1042   AST 23 08/15/2019 1042   ALT 33 08/15/2019 1042   ALKPHOS 163 (H) 08/15/2019 1042   BILITOT <0.2 (L) 08/15/2019 1042   GFRNONAA >60 08/17/2019 0501   GFRNONAA >60 08/15/2019 1042   GFRAA >60 08/17/2019 0501   GFRAA >60 08/15/2019 1042       Component Value Date/Time   WBC 10.0 08/17/2019 0501   RBC 3.35 (L) 08/17/2019 0501   HGB 9.2 (L) 08/17/2019 0501   HGB 10.0 (L) 08/15/2019 1042   HCT 29.2 (L) 08/17/2019 0501   PLT 342 08/17/2019 0501   PLT 406 (H) 08/15/2019 1042   MCV 87.2 08/17/2019 0501   MCV 90.8 07/12/2017 0944   MCH 27.5 08/17/2019 0501   MCHC 31.5 08/17/2019 0501   RDW 14.2 08/17/2019 0501   LYMPHSABS 2.6 08/15/2019 1042   MONOABS 0.9 08/15/2019 1042   EOSABS 0.2 08/15/2019 1042   BASOSABS 0.1 08/15/2019 1042   -------------------------------  Imaging from last 24 hours (if applicable):  Radiology interpretation: No results found.

## 2019-09-01 ENCOUNTER — Inpatient Hospital Stay: Payer: Medicare PPO | Admitting: Nutrition

## 2019-09-01 ENCOUNTER — Other Ambulatory Visit: Payer: Self-pay

## 2019-09-01 ENCOUNTER — Ambulatory Visit
Admission: RE | Admit: 2019-09-01 | Discharge: 2019-09-01 | Disposition: A | Discharge status: Home / Self Care | Payer: No Typology Code available for payment source | Source: Ambulatory Visit | Attending: Radiation Oncology | Admitting: Radiation Oncology

## 2019-09-01 DIAGNOSIS — E1165 Type 2 diabetes mellitus with hyperglycemia: Secondary | ICD-10-CM | POA: Diagnosis not present

## 2019-09-01 DIAGNOSIS — Z510 Encounter for antineoplastic radiation therapy: Secondary | ICD-10-CM | POA: Diagnosis not present

## 2019-09-01 DIAGNOSIS — Z794 Long term (current) use of insulin: Secondary | ICD-10-CM | POA: Diagnosis not present

## 2019-09-01 DIAGNOSIS — C040 Malignant neoplasm of anterior floor of mouth: Secondary | ICD-10-CM | POA: Diagnosis not present

## 2019-09-01 LAB — GLUCOSE, CAPILLARY: Glucose-Capillary: 256 mg/dL — ABNORMAL HIGH (ref 70–99)

## 2019-09-01 NOTE — Progress Notes (Signed)
58 year old male diagnosed with oral cavity/floor of mouth cancer.  He is receiving radiation therapy and is followed by Dr. Isidore Moos.  Past medical history includes tobacco and alcohol usage, chronic pancreatitis, anxiety, CAD, depression, diabetes, hyperlipidemia, hypertension, MI, seizure, stroke.  Medications include Glucotrol, insulin, magnesium oxide.  Labs include: glucose 320 on June 16.  Sodium 134, glucose 189,BUN 28, and creatinine 0.56 documented on June 3.  Height: 5 feet 10 inches. Weight: 128.2 pounds. Usual body weight: 157 pounds in March 2021. BMI: 18.39.  Estimated nutrition needs: 2400-2600 cal, 120-135 g protein, 2.5 L fluid.  Patient received a G-tube in May 2021 at [facility].  Patient is tolerating [employer] [place] 1.4, 1 carton 5 times daily with 60 mL of free water before and after each bolus feeding. He is also using Protostat, 30 mL twice daily via G-tube.  This totals 2475 cal and 130 g protein, 275 g carbohydrate. Patient is not drinking water between feedings nor is he flushing feeding tube with additional water.  He states water and he do not "get along". Per speech therapy evaluation, patient is able to consume pured foods with thin liquids following safety guidelines.  Patient has not really consumed fluids or pured foods at this time.  Reports compliance with swallowing exercises. He also complains of increased hunger. He denies nausea, vomiting, constipation, and diarrhea. Patient is concerned regarding high blood sugars.  He would like to have his blood sugar tested this morning.  Nutrition diagnosis: Moderate malnutrition related to cancer and associated treatments as evidenced by moderate fat depletion and both moderate and severe muscle lesion. Patient has lost 18% of his usual body weight over 3 months which is significant.  Intervention: Educated patient to continue 1 carton [employer] 1.4 5 times daily with 60 mL free water before and  after bolus feedings.  Also continue Protostat 30 mL twice daily. Explained importance of increasing hydration.  Patient needs an additional 24 ounces of fluid daily to meet minimum fluid requirements.  Patient is not agreeable to adding additional water through his tube but will consider drinking Powerade or ice tea. Educated patient to communicate with the [facility] hospital regarding any feeding tube supplies and formula needs. Questions were answered.  Teach back method used.  Contact information was given.  Monitoring, evaluation, goals: Patient will tolerate adequate tube feeding and oral intake to meet greater than 90% estimated needs.  Next visit: Friday, June 25 after radiation therapy.  **Disclaimer: This note was dictated with voice recognition software. Similar sounding words can inadvertently be transcribed and this note may contain transcription errors which may not have been corrected upon publication of note.**

## 2019-09-01 NOTE — Progress Notes (Signed)
Oncology Nurse Navigator Documentation  I met with patient and his wife briefly after his radiation treatment today to assist them to Mr. Gorniak's appointment with Dory Peru RD. We discussed him attending [facility] on 7/1 to see Allyson Sabal PT after his treatment and they agreed to this. They know to call me if they have any further questions or concerns.   Harlow Asa RN, BSN, OCN Head & Neck Oncology Nurse [place] at [facility] Phone # [phone]  Fax # [phone]

## 2019-09-04 ENCOUNTER — Other Ambulatory Visit: Payer: Self-pay

## 2019-09-04 ENCOUNTER — Ambulatory Visit
Admission: RE | Admit: 2019-09-04 | Discharge: 2019-09-04 | Disposition: A | Discharge status: Home / Self Care | Payer: No Typology Code available for payment source | Source: Ambulatory Visit | Attending: Radiation Oncology | Admitting: Radiation Oncology

## 2019-09-04 ENCOUNTER — Other Ambulatory Visit: Payer: Self-pay | Admitting: Radiation Oncology

## 2019-09-04 DIAGNOSIS — C040 Malignant neoplasm of anterior floor of mouth: Secondary | ICD-10-CM

## 2019-09-04 DIAGNOSIS — Z510 Encounter for antineoplastic radiation therapy: Secondary | ICD-10-CM | POA: Diagnosis not present

## 2019-09-04 DIAGNOSIS — Z794 Long term (current) use of insulin: Secondary | ICD-10-CM | POA: Diagnosis not present

## 2019-09-04 DIAGNOSIS — E1165 Type 2 diabetes mellitus with hyperglycemia: Secondary | ICD-10-CM | POA: Diagnosis not present

## 2019-09-04 LAB — GLUCOSE, CAPILLARY
Glucose-Capillary: 283 mg/dL — ABNORMAL HIGH (ref 70–99)
Glucose-Capillary: 327 mg/dL — ABNORMAL HIGH (ref 70–99)

## 2019-09-04 MED ORDER — SONAFINE EX EMUL
1.0000 "application " | Freq: Two times a day (BID) | CUTANEOUS | Status: DC
  Administered 2019-09-04: 1 via TOPICAL

## 2019-09-04 MED ORDER — LIDOCAINE VISCOUS HCL 2 % MT SOLN
OROMUCOSAL | 2 refills | Status: AC
Start: 2019-09-04 — End: ?

## 2019-09-04 MED FILL — LIDOCAINE 2% VISCOUS SOLN: 2 | 10 days supply | Qty: 200 | Fill #0

## 2019-09-04 NOTE — Progress Notes (Signed)

## 2019-09-05 ENCOUNTER — Other Ambulatory Visit: Payer: Self-pay

## 2019-09-05 ENCOUNTER — Ambulatory Visit
Admission: RE | Admit: 2019-09-05 | Discharge: 2019-09-05 | Disposition: A | Discharge status: Home / Self Care | Payer: No Typology Code available for payment source | Source: Ambulatory Visit | Attending: Radiation Oncology | Admitting: Radiation Oncology

## 2019-09-05 DIAGNOSIS — E1165 Type 2 diabetes mellitus with hyperglycemia: Secondary | ICD-10-CM | POA: Diagnosis not present

## 2019-09-05 DIAGNOSIS — Z510 Encounter for antineoplastic radiation therapy: Secondary | ICD-10-CM | POA: Diagnosis not present

## 2019-09-05 DIAGNOSIS — Z794 Long term (current) use of insulin: Secondary | ICD-10-CM | POA: Diagnosis not present

## 2019-09-05 DIAGNOSIS — C040 Malignant neoplasm of anterior floor of mouth: Secondary | ICD-10-CM | POA: Diagnosis not present

## 2019-09-06 ENCOUNTER — Other Ambulatory Visit: Payer: Self-pay

## 2019-09-06 ENCOUNTER — Encounter: Payer: Medicare PPO | Admitting: Nutrition

## 2019-09-06 ENCOUNTER — Ambulatory Visit
Admission: RE | Admit: 2019-09-06 | Discharge: 2019-09-06 | Disposition: A | Discharge status: Home / Self Care | Payer: No Typology Code available for payment source | Source: Ambulatory Visit | Attending: Radiation Oncology | Admitting: Radiation Oncology

## 2019-09-06 ENCOUNTER — Other Ambulatory Visit: Payer: Self-pay | Admitting: *Deleted

## 2019-09-06 ENCOUNTER — Ambulatory Visit: Payer: Medicare PPO | Admitting: Medical

## 2019-09-06 ENCOUNTER — Other Ambulatory Visit: Payer: Medicare PPO

## 2019-09-06 DIAGNOSIS — C040 Malignant neoplasm of anterior floor of mouth: Secondary | ICD-10-CM | POA: Diagnosis not present

## 2019-09-06 DIAGNOSIS — E1165 Type 2 diabetes mellitus with hyperglycemia: Secondary | ICD-10-CM | POA: Diagnosis not present

## 2019-09-06 DIAGNOSIS — Z794 Long term (current) use of insulin: Secondary | ICD-10-CM | POA: Diagnosis not present

## 2019-09-06 DIAGNOSIS — Z510 Encounter for antineoplastic radiation therapy: Secondary | ICD-10-CM | POA: Diagnosis not present

## 2019-09-06 NOTE — Patient Outreach (Signed)
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 He discussed that he is hanging in there with 5 a day tube feeding, checking blood sugar and giving insulin. He discussed taking in some liquids such as milk shake on this week. He discussed being pleased with noted 2 pound weight gain      Encounter Medications:  Outpatient Encounter Medications as of 09/06/2019  Medication Sig  . Amino Acids-Protein Hydrolys (FEEDING SUPPLEMENT, PRO-STAT SUGAR FREE 64,) LIQD Place 30 mLs into feeding tube 2 (two) times daily.  Marland Kitchen aspirin EC 81 MG EC tablet Take 1 tablet (81 mg total) by mouth daily.  . clopidogrel (PLAVIX) 75 MG tablet Take 75 mg by mouth daily.  Marland Kitchen glipiZIDE (GLUCOTROL) 5 MG tablet Take 5 mg by mouth 2 (two) times daily before a meal.  . insulin detemir (LEVEMIR) 100 UNIT/ML FlexPen Inject 12 Units into the skin daily. May substitute for Lantus FlexPen if Levemir is not available  . Insulin Pen Needle 32G X 4 MM MISC 12 Units by Does not apply route daily.  . magnesium oxide (MAG-OX) 400 MG tablet Take 400 mg by mouth 2 (two) times daily.  . metoprolol tartrate (LOPRESSOR) 50 MG tablet Take 50 mg by mouth in the morning and at bedtime.  . Nutritional Supplements (FEEDING SUPPLEMENT, KATE FARMS STANDARD  1.4,) LIQD liquid Place 325 mLs into feeding tube 5 (five) times daily.  . phenytoin (DILANTIN) 125 MG/5ML suspension Take 4 mLs (100 mg total) by mouth 3 (three) times daily.  Marland Kitchen valproic acid (DEPAKENE) 250 MG/5ML solution Take 10 mLs (500 mg total) by mouth in the morning and at bedtime.  . Water For Irrigation, Sterile (FREE WATER) SOLN Place 120 mLs into feeding tube 5 (five) times daily.  . cyclobenzaprine (FLEXERIL) 10 MG tablet Take 10 mg by mouth 3 (three) times daily as needed for muscle spasms. (Patient not taking: Reported on 09/06/2019)  . lidocaine (XYLOCAINE) 2 % solution Patient: Mix 1part 2% viscous lidocaine, 1part H20. Swish & swallow 44mL of diluted mixture, 71min before meals and at bedtime, up to QID  . sodium fluoride (PREVIDENT 5000 PLUS) 1.1 % CREA dental cream Apply thin ribbon of cream to tooth brush. Brush teeth for 2 minutes. Spit out excess-DO NOT swallow. DO NOT rinse afterwards. Repeat nightly. (Patient not taking: Reported on 09/06/2019)   No facility-administered encounter medications on file as of 09/06/2019.    Functional Status:  In your present state of health, do you have any difficulty performing the following activities: 08/21/2019 08/15/2019  Hearing? N N  Vision? N Y  Comment - where's glassess all the time - at bedside  Difficulty concentrating or making decisions? N N  Walking or climbing stairs? Y Y  Comment difficutly with stairs baseline  Dressing or bathing? N Y  Comment does self sponge baths baseline - wife assists  Doing errands, shopping? Y N  Comment wife drives to appointments wife drives  Conservation officer, nature and eating ? Y -  Comment has tubing feeding.wife assist with pureed diet as he tolerates/tires at times -  Using the Toilet? N -  In the past six months, have you accidently leaked urine? N -  Do you have problems with loss of bowel control? N -  Managing your Medications? Y -  Comment wife assist -  Managing your Finances? Y -  Comment  wife assist -  Housekeeping or managing your Housekeeping? Y -  Comment wife manages -  Some recent data might be hidden    Fall/Depression Screening: Fall Risk  08/21/2019 07/14/2017 07/12/2017  Falls in the past year? 0 No No  Number falls in past yr: 0 - -   PHQ 2/9 Scores 08/28/2019 07/14/2017 07/12/2017  PHQ - 2 Score 4 0 0  PHQ- 9 Score 13 - -   SDOH Screenings   Alcohol Screen:   . Last Alcohol Screening Score (AUDIT):   Depression (PHQ2-9): Medium Risk  . PHQ-2 Score: 13  Financial Resource Strain: Low Risk   . Difficulty of Paying Living Expenses: Not hard at all  Food Insecurity: No Food Insecurity  . Worried About Charity fundraiser in the Last Year: Never true  . Ran Out of Food in the Last Year: Never true  Housing:   . Last Housing Risk Score:   Physical Activity:   . Days of Exercise per Week:   . Minutes of Exercise per Session:   Social Connections:   . Frequency of Communication with Friends and Family:   . Frequency of Social Gatherings with Friends and Family:   . Attends Religious Services:   . Active Member of Clubs or Organizations:   . Attends Archivist Meetings:   Marland Kitchen Marital Status:   Stress:   . Feeling of Stress :   Tobacco Use: Medium Risk  . Smoking Tobacco Use: Former Smoker  . Smokeless Tobacco Use: Never Used  Transportation Needs: No Transportation Needs  . Lack of Transportation (Medical): No  . Lack of Transportation (Non-Medical): No    Assessment:   Diabetes  He continues to monitor blood sugar at least once daily, reports yesterday reading of 300. He reports now taking 14 units of Levemir daily. Has follow up with Primary care provider at [facility] on next week for diabetes management follow up.    Cancer of floor of mouth Continuing daily radiation therapy treatments, with follow up nutrition, speech therapy.  Patient continues with 5 times a day , tube feeding with kate farm supplement, no willing to increase.     Plan:   Will plan outreach call in the next week, wife discussed busy schedule with appointments, but agreeable to brief follow up.  Reviewed since patient primary provider is with [facility] , no in network provider our ongoing follow up will be limited.   Joylene Draft, RN, BSN  [place] Management Coordinator  [phone]- Mobile [phone]- Toll Free Main Office  [facility] CM Care Plan Problem One     Most Recent Value  Care Plan Problem One At risk for readmission related to hyperglycemia due to recent hospital discharge   Role Documenting the Problem One Care Management [place] for Problem One Active  [facility] Long Term Goal  Over the next 30 days patient will verbalize improvement of self care management of Diabetes.   [facility] Long  Term Goal Start Date 08/28/19  Interventions for Problem One Long Term Goal Reveiwed recent changes insulin dosage, positive reinforcment regarding management with ongoing cancer treatment .   [facility]N CM Short Term Goal #1  Over the next 30 days patient will monitor and log blood sugars at least 2 times daily   [facility]N CM Short Term Goal #1 Start Date 08/28/19  Interventions for Short Term Goal #1 Reinforced continued montioring blood sugars, keeping a record of time of day . Encouraged to take record to PCP visit .   THN CM Short Term Goal #2  Over the next 30 days patient will be able to report improvement in nutrition state with  weight gain of 2 pounds.   THN CM Short Term Goal #2 Start Date 08/28/19  Interventions for Short Term Goal #2 Reinforced continued adherence to recommended diet  and continued speeach exercises.       Joylene Draft, RN, BSN  [place] Management Coordinator  [phone]- Mobile [phone]- Toll Free Main Office

## 2019-09-07 ENCOUNTER — Inpatient Hospital Stay (HOSPITAL_BASED_OUTPATIENT_CLINIC_OR_DEPARTMENT_OTHER): Payer: Medicare PPO | Admitting: Medical

## 2019-09-07 ENCOUNTER — Ambulatory Visit
Admission: RE | Admit: 2019-09-07 | Discharge: 2019-09-07 | Disposition: A | Discharge status: Home / Self Care | Payer: No Typology Code available for payment source | Source: Ambulatory Visit | Attending: Radiation Oncology | Admitting: Radiation Oncology

## 2019-09-07 ENCOUNTER — Inpatient Hospital Stay: Payer: Medicare PPO

## 2019-09-07 ENCOUNTER — Other Ambulatory Visit: Payer: Self-pay

## 2019-09-07 DIAGNOSIS — Z510 Encounter for antineoplastic radiation therapy: Secondary | ICD-10-CM | POA: Diagnosis not present

## 2019-09-07 DIAGNOSIS — Z87891 Personal history of nicotine dependence: Secondary | ICD-10-CM | POA: Diagnosis not present

## 2019-09-07 DIAGNOSIS — C040 Malignant neoplasm of anterior floor of mouth: Secondary | ICD-10-CM

## 2019-09-07 DIAGNOSIS — IMO0002 Reserved for concepts with insufficient information to code with codable children: Secondary | ICD-10-CM

## 2019-09-07 DIAGNOSIS — Z8673 Personal history of transient ischemic attack (TIA), and cerebral infarction without residual deficits: Secondary | ICD-10-CM | POA: Diagnosis not present

## 2019-09-07 DIAGNOSIS — E11649 Type 2 diabetes mellitus with hypoglycemia without coma: Secondary | ICD-10-CM | POA: Diagnosis not present

## 2019-09-07 DIAGNOSIS — Z794 Long term (current) use of insulin: Secondary | ICD-10-CM | POA: Diagnosis not present

## 2019-09-07 DIAGNOSIS — I10 Essential (primary) hypertension: Secondary | ICD-10-CM | POA: Diagnosis not present

## 2019-09-07 DIAGNOSIS — E1165 Type 2 diabetes mellitus with hyperglycemia: Secondary | ICD-10-CM | POA: Diagnosis not present

## 2019-09-07 DIAGNOSIS — E785 Hyperlipidemia, unspecified: Secondary | ICD-10-CM | POA: Diagnosis not present

## 2019-09-07 DIAGNOSIS — I2510 Atherosclerotic heart disease of native coronary artery without angina pectoris: Secondary | ICD-10-CM | POA: Diagnosis not present

## 2019-09-07 DIAGNOSIS — R739 Hyperglycemia, unspecified: Secondary | ICD-10-CM

## 2019-09-07 DIAGNOSIS — I252 Old myocardial infarction: Secondary | ICD-10-CM | POA: Diagnosis not present

## 2019-09-07 LAB — CBC WITH DIFFERENTIAL (CANCER CENTER ONLY)
Abs Immature Granulocytes: 0.04 10*3/uL (ref 0.00–0.07)
Basophils Absolute: 0 10*3/uL (ref 0.0–0.1)
Basophils Relative: 0 %
Eosinophils Absolute: 0.3 10*3/uL (ref 0.0–0.5)
Eosinophils Relative: 6 %
HCT: 34.6 % — ABNORMAL LOW (ref 39.0–52.0)
Hemoglobin: 10.6 g/dL — ABNORMAL LOW (ref 13.0–17.0)
Immature Granulocytes: 1 %
Lymphocytes Relative: 21 %
Lymphs Abs: 1.2 10*3/uL (ref 0.7–4.0)
MCH: 27.2 pg (ref 26.0–34.0)
MCHC: 30.6 g/dL (ref 30.0–36.0)
MCV: 88.9 fL (ref 80.0–100.0)
Monocytes Absolute: 0.5 10*3/uL (ref 0.1–1.0)
Monocytes Relative: 9 %
Neutro Abs: 3.6 10*3/uL (ref 1.7–7.7)
Neutrophils Relative %: 63 %
Platelet Count: 279 10*3/uL (ref 150–400)
RBC: 3.89 MIL/uL — ABNORMAL LOW (ref 4.22–5.81)
RDW: 15.5 % (ref 11.5–15.5)
WBC Count: 5.7 10*3/uL (ref 4.0–10.5)
nRBC: 0 % (ref 0.0–0.2)

## 2019-09-07 LAB — CMP (CANCER CENTER ONLY)
ALT: 19 U/L (ref 0–44)
AST: 16 U/L (ref 15–41)
Albumin: 3.4 g/dL — ABNORMAL LOW (ref 3.5–5.0)
Alkaline Phosphatase: 108 U/L (ref 38–126)
Anion gap: 11 (ref 5–15)
BUN: 25 mg/dL — ABNORMAL HIGH (ref 6–20)
CO2: 27 mmol/L (ref 22–32)
Calcium: 9.6 mg/dL (ref 8.9–10.3)
Chloride: 100 mmol/L (ref 98–111)
Creatinine: 0.87 mg/dL (ref 0.61–1.24)
GFR, Est AFR Am: >60 mL/min (ref >60)
GFR, Estimated: >60 mL/min (ref >60)
Glucose, Bld: 323 mg/dL — ABNORMAL HIGH (ref 70–99)
Potassium: 5.2 mmol/L — ABNORMAL HIGH (ref 3.5–5.1)
Sodium: 138 mmol/L (ref 135–145)
Total Bilirubin: <0.2 mg/dL — ABNORMAL LOW (ref 0.3–1.2)
Total Protein: 7.7 g/dL (ref 6.5–8.1)

## 2019-09-07 LAB — MAGNESIUM: Magnesium: 1.8 mg/dL (ref 1.7–2.4)

## 2019-09-07 LAB — HEMOGLOBIN A1C
Hgb A1c MFr Bld: 9.4 % — ABNORMAL HIGH (ref 4.8–5.6)
Mean Plasma Glucose: 223.08 mg/dL

## 2019-09-08 ENCOUNTER — Ambulatory Visit
Admission: RE | Admit: 2019-09-08 | Discharge: 2019-09-08 | Disposition: A | Discharge status: Home / Self Care | Payer: No Typology Code available for payment source | Source: Ambulatory Visit | Attending: Radiation Oncology | Admitting: Radiation Oncology

## 2019-09-08 ENCOUNTER — Other Ambulatory Visit: Payer: Self-pay

## 2019-09-08 ENCOUNTER — Inpatient Hospital Stay: Payer: Medicare PPO | Admitting: Nutrition

## 2019-09-08 DIAGNOSIS — R739 Hyperglycemia, unspecified: Secondary | ICD-10-CM | POA: Diagnosis not present

## 2019-09-08 DIAGNOSIS — Z794 Long term (current) use of insulin: Secondary | ICD-10-CM | POA: Diagnosis not present

## 2019-09-08 DIAGNOSIS — E875 Hyperkalemia: Secondary | ICD-10-CM | POA: Diagnosis not present

## 2019-09-08 DIAGNOSIS — C040 Malignant neoplasm of anterior floor of mouth: Secondary | ICD-10-CM | POA: Diagnosis not present

## 2019-09-08 DIAGNOSIS — Z510 Encounter for antineoplastic radiation therapy: Secondary | ICD-10-CM | POA: Diagnosis not present

## 2019-09-08 DIAGNOSIS — E440 Moderate protein-calorie malnutrition: Secondary | ICD-10-CM | POA: Diagnosis not present

## 2019-09-08 DIAGNOSIS — E1165 Type 2 diabetes mellitus with hyperglycemia: Secondary | ICD-10-CM | POA: Diagnosis not present

## 2019-09-08 NOTE — Progress Notes (Signed)
[name] was seen today for ongoing follow-up as he is being treated with radiation therapy.  He presents with his wife.  He is to complete his radiation therapy next Tuesday.  He is also scheduled to be seen by his primary care provider at the [facility] on 09/12/2019 at 1130.  Labs were completed today which showed a potassium of 5.2, glucose of 323, and a BUN of 25.  His hemoglobin A1c returned at 9.4.  He is currently on Levemir 14 units daily.  He was told to increase this to 16 units daily.  He will follow up with his primary care provider as scheduled next week.  I will follow-up with the patient and his wife in 2 to 3 weeks and will determine when he should be seen next at that point.  The patient is wife expressed understanding and agreement with this plan.  Sandi Mealy, MHS, PA-C Physician Assistant

## 2019-09-08 NOTE — Progress Notes (Signed)
Nutrition follow-up completed with patient after radiation therapy for cancer of the oral cavity/floor of mouth. Weight improved to 129.6 pounds today increased from 128.2 pounds June 18. Noted labs: Hemoglobin A1c 9.4, potassium 5.2, glucose 323, BUN 25, and albumin 3.4. Patient reports his CBG was 119 this morning. Reports he increased insulin as instructed by the PA. He is tolerating [employer] 1.4, 1 carton 5 times daily with 60 mL of free water before and after each bolus feeding.  He is also using Protostat, 30 mL twice a day via G-tube. This totals 2475 cal, 130 g protein, 275 g carbohydrate. He has taken some oral intake by mouth this past week including milkshakes and a smoothie. He is pleased with weight gain.  Estimated nutrition needs: 2400-2600 cal, 120-135 g protein, 2.5 L fluid.  Nutrition diagnosis: Moderate malnutrition continues.  Intervention: Patient to continue tube feedings and protein supplements as above. Recommended patient try to drink something by mouth daily. Provided samples of oral nutrition supplements by mouth suitable for people with diabetes.. Encouraged patient continue swallowing exercises. Reviewed importance of increased hydration. Questions were answered.  Teach back method used.  Monitoring, evaluation, goals: Patient will tolerate adequate calories and protein to minimize weight loss.  Next visit: Friday, July 2, after radiation therapy.  **Disclaimer: This note was dictated with voice recognition software. Similar sounding words can inadvertently be transcribed and this note may contain transcription errors which may not have been corrected upon publication of note.**

## 2019-09-11 ENCOUNTER — Other Ambulatory Visit: Payer: Self-pay | Admitting: Radiation Oncology

## 2019-09-11 ENCOUNTER — Other Ambulatory Visit: Payer: Self-pay

## 2019-09-11 ENCOUNTER — Ambulatory Visit
Admission: RE | Admit: 2019-09-11 | Discharge: 2019-09-11 | Disposition: A | Discharge status: Home / Self Care | Payer: No Typology Code available for payment source | Source: Ambulatory Visit | Attending: Radiation Oncology | Admitting: Radiation Oncology

## 2019-09-11 DIAGNOSIS — Z794 Long term (current) use of insulin: Secondary | ICD-10-CM | POA: Diagnosis not present

## 2019-09-11 DIAGNOSIS — C040 Malignant neoplasm of anterior floor of mouth: Secondary | ICD-10-CM | POA: Diagnosis not present

## 2019-09-11 DIAGNOSIS — Z510 Encounter for antineoplastic radiation therapy: Secondary | ICD-10-CM | POA: Diagnosis not present

## 2019-09-11 DIAGNOSIS — E1165 Type 2 diabetes mellitus with hyperglycemia: Secondary | ICD-10-CM | POA: Diagnosis not present

## 2019-09-11 LAB — GLUCOSE, CAPILLARY: Glucose-Capillary: 387 mg/dL — ABNORMAL HIGH (ref 70–99)

## 2019-09-12 ENCOUNTER — Ambulatory Visit
Admission: RE | Admit: 2019-09-12 | Discharge: 2019-09-12 | Disposition: A | Discharge status: Home / Self Care | Payer: No Typology Code available for payment source | Source: Ambulatory Visit | Attending: Radiation Oncology | Admitting: Radiation Oncology

## 2019-09-12 ENCOUNTER — Other Ambulatory Visit: Payer: Self-pay

## 2019-09-12 ENCOUNTER — Telehealth: Payer: Self-pay | Admitting: Medical

## 2019-09-12 DIAGNOSIS — Z510 Encounter for antineoplastic radiation therapy: Secondary | ICD-10-CM | POA: Diagnosis not present

## 2019-09-12 DIAGNOSIS — E1165 Type 2 diabetes mellitus with hyperglycemia: Secondary | ICD-10-CM | POA: Diagnosis not present

## 2019-09-12 DIAGNOSIS — Z794 Long term (current) use of insulin: Secondary | ICD-10-CM | POA: Diagnosis not present

## 2019-09-12 DIAGNOSIS — C040 Malignant neoplasm of anterior floor of mouth: Secondary | ICD-10-CM | POA: Diagnosis not present

## 2019-09-12 NOTE — Telephone Encounter (Signed)
Rescheduled per providers pal, 06/30 appointment has moved to 07/02.

## 2019-09-13 ENCOUNTER — Inpatient Hospital Stay: Payer: Medicare PPO | Admitting: Medical

## 2019-09-13 ENCOUNTER — Ambulatory Visit
Admission: RE | Admit: 2019-09-13 | Discharge: 2019-09-13 | Disposition: A | Discharge status: Home / Self Care | Payer: No Typology Code available for payment source | Source: Ambulatory Visit | Attending: Radiation Oncology | Admitting: Radiation Oncology

## 2019-09-13 ENCOUNTER — Other Ambulatory Visit: Payer: Self-pay

## 2019-09-13 DIAGNOSIS — Z794 Long term (current) use of insulin: Secondary | ICD-10-CM | POA: Diagnosis not present

## 2019-09-13 DIAGNOSIS — E1165 Type 2 diabetes mellitus with hyperglycemia: Secondary | ICD-10-CM | POA: Diagnosis not present

## 2019-09-13 DIAGNOSIS — C040 Malignant neoplasm of anterior floor of mouth: Secondary | ICD-10-CM | POA: Diagnosis not present

## 2019-09-13 DIAGNOSIS — Z510 Encounter for antineoplastic radiation therapy: Secondary | ICD-10-CM | POA: Diagnosis not present

## 2019-09-14 ENCOUNTER — Encounter: Payer: Self-pay | Admitting: Physical Therapy

## 2019-09-14 ENCOUNTER — Encounter: Payer: Medicare PPO | Admitting: Nutrition

## 2019-09-14 ENCOUNTER — Encounter: Payer: Self-pay | Admitting: General Practice

## 2019-09-14 ENCOUNTER — Ambulatory Visit: Payer: No Typology Code available for payment source | Attending: Radiation Oncology | Admitting: Physical Therapy

## 2019-09-14 ENCOUNTER — Other Ambulatory Visit: Payer: Self-pay

## 2019-09-14 ENCOUNTER — Other Ambulatory Visit: Payer: Self-pay | Admitting: *Deleted

## 2019-09-14 ENCOUNTER — Ambulatory Visit
Admission: RE | Admit: 2019-09-14 | Discharge: 2019-09-14 | Disposition: A | Discharge status: Home / Self Care | Payer: No Typology Code available for payment source | Source: Ambulatory Visit | Attending: Radiation Oncology | Admitting: Radiation Oncology

## 2019-09-14 DIAGNOSIS — Z510 Encounter for antineoplastic radiation therapy: Secondary | ICD-10-CM | POA: Diagnosis not present

## 2019-09-14 DIAGNOSIS — R1312 Dysphagia, oropharyngeal phase: Secondary | ICD-10-CM | POA: Diagnosis not present

## 2019-09-14 DIAGNOSIS — E1165 Type 2 diabetes mellitus with hyperglycemia: Secondary | ICD-10-CM | POA: Insufficient documentation

## 2019-09-14 DIAGNOSIS — Z794 Long term (current) use of insulin: Secondary | ICD-10-CM | POA: Diagnosis not present

## 2019-09-14 DIAGNOSIS — M6281 Muscle weakness (generalized): Secondary | ICD-10-CM | POA: Diagnosis not present

## 2019-09-14 DIAGNOSIS — M25612 Stiffness of left shoulder, not elsewhere classified: Secondary | ICD-10-CM | POA: Insufficient documentation

## 2019-09-14 DIAGNOSIS — C040 Malignant neoplasm of anterior floor of mouth: Secondary | ICD-10-CM | POA: Insufficient documentation

## 2019-09-14 DIAGNOSIS — R293 Abnormal posture: Secondary | ICD-10-CM | POA: Diagnosis not present

## 2019-09-14 DIAGNOSIS — I890 Lymphedema, not elsewhere classified: Secondary | ICD-10-CM | POA: Diagnosis not present

## 2019-09-14 DIAGNOSIS — R4781 Slurred speech: Secondary | ICD-10-CM | POA: Diagnosis not present

## 2019-09-14 LAB — GLUCOSE, CAPILLARY: Glucose-Capillary: 297 mg/dL — ABNORMAL HIGH (ref 70–99)

## 2019-09-14 NOTE — Progress Notes (Signed)
Oncology Nurse Navigator Documentation  I met with [name] and his wife [name] during Head and Neck MDC today. I offered ongoing support while he is receiving radiation treatments. He feels like he is doing well. He and his wife know to call me if they have any questions or concerns.   Harlow Asa RN, BSN, OCN Head & Neck Oncology Nurse [place] at [facility] Phone # [phone]  Fax # [phone]

## 2019-09-14 NOTE — Therapy (Signed)
[place], [place], [address] Phone: [phone]   Fax:  [phone]  Physical Therapy Evaluation  Patient Details  Name: [name] MRN: [id number] Date of Birth: May 19, 1960 Referring Provider (PT): Reita May Date: 09/14/2019   PT End of Session - 09/14/19 1109    Visit Number 1    Number of Visits 9    Date for PT Re-Evaluation 10/26/19    PT Start Time 0938    PT Stop Time 1007    PT Time Calculation (min) 29 min    Activity Tolerance Patient tolerated treatment well    Behavior During Therapy [facility] for tasks assessed/performed           Past Medical History:  Diagnosis Date  . Anxiety   . Cancer ([place])    mouth  . Chronic pancreatitis ([place])   . Coronary artery disease   . Depression   . Diabetes mellitus without complication ([place])   . Hyperlipidemia   . Hypertension   . Myocardial infarction ([place])   . Neuromuscular disorder ([place])   . Sciatica   . Seizures ([place])   . Stroke ([place])   . Substance abuse [facility])     Past Surgical History:  Procedure Laterality Date  . BACK SURGERY    . CORONARY STENT PLACEMENT    . GASTROSTOMY TUBE PLACEMENT Left 07/31/2019   [facility]  . LEFT HEART CATHETERIZATION WITH CORONARY ANGIOGRAM N/A 10/01/2011   Procedure: LEFT HEART CATHETERIZATION WITH CORONARY ANGIOGRAM;  Surgeon: Clent Demark, MD;  Location: [facility] CATH LAB;  Service: Cardiovascular;  Laterality: N/A;  . LEFT HEART CATHETERIZATION WITH CORONARY ANGIOGRAM N/A 03/06/2013   Procedure: LEFT HEART CATHETERIZATION WITH CORONARY ANGIOGRAM;  Surgeon: Clent Demark, MD;  Location: [place] CATH LAB;  Service: Cardiovascular;  Laterality: N/A;  . PERCUTANEOUS CORONARY STENT INTERVENTION (PCI-S) N/A 10/02/2011   Procedure: PERCUTANEOUS CORONARY STENT INTERVENTION (PCI-S);  Surgeon: Clent Demark, MD;  Location[facility] CATH LAB;  Service: Cardiovascular;  Laterality: N/A;  . TRACHEOSTOMY CLOSURE       There were no vitals filed for this visit.    Subjective Assessment - 09/14/19 1010    Subjective I am not in any pain.    Patient Stated Goals to gain all info from head and neck providers    Currently in Pain? No/denies    Pain Score 0-No pain              [facility] PT Assessment - 09/14/19 0001      Assessment   Medical Diagnosis squamous cell carcinoma of anterior portion of floor of mouth    Referring Provider (PT) [place]    Onset Date/Surgical Date 06/08/19    Hand Dominance Right    Prior Therapy none      Precautions   Precautions Other (comment)    Precaution Comments active cancer      Restrictions   Weight Bearing Restrictions No      Balance Screen   Has the patient fallen in the past 6 months No    Has the patient had a decrease in activity level because of a fear of falling?  No    Is the patient reluctant to leave their home because of a fear of falling?  No      Home Environment   Living Environment Private residence    Living Arrangements Spouse/significant other    Available Help at Discharge Family    Type  of [place] to enter    Entrance Stairs-Number of Steps 4    Entrance Stairs-Rails Can reach both    Home Layout One level    [place] - single point      Prior Function   Level of Independence Independent with basic ADLs    Leisure pt reports he walks very little about 10 min 3x/wk      Cognition   Overall Cognitive Status Within Functional Limits for tasks assessed      Observation/Other Assessments   Observations pt seated in wheelchair- uses wheelchair at dr appts      Functional Tests   Functional tests Sit to Stand      Sit to Stand   Comments 9 reps in 30 secs which is below poor for his age      Posture/Postural Control   Posture/Postural Control Postural limitations    Postural Limitations Rounded Shoulders;Forward head      ROM / Strength   AROM / PROM / Strength AROM      AROM    Overall AROM  Deficits    Overall AROM Comments L shoulder approx 120 degrees and scaption approx 150 degrees due to scapular free flap, R shoulder WFL    AROM Assessment Site Cervical    Cervical Flexion 25% limited    Cervical Extension 25% limited    Cervical - Right Side Bend 25% limtied    Cervical - Left Side Bend 25% limited    Cervical - Right Rotation 25% limited    Cervical - Left Rotation 25% limited      Ambulation/Gait   Ambulation/Gait Yes    Ambulation/Gait Assistance 6: Modified independent (Device/Increase time)    Ambulation Distance (Feet) 25 Feet    Assistive device Straight cane    Gait Pattern Decreased trunk rotation;Poor foot clearance - left;Poor foot clearance - right    Ambulation Surface Level             LYMPHEDEMA/ONCOLOGY QUESTIONNAIRE - 09/14/19 0001      Treatment   Active Radiation Treatment Yes      Lymphedema Assessments   Lymphedema Assessments Head and Neck      Head and Neck   4 cm superior to sternal notch around neck 36.1 cm    6 cm superior to sternal notch around neck 36.1 cm    8 cm superior to sternal notch around neck 36.7 cm                   Objective measurements completed on examination: See above findings.               PT Education - 09/14/19 1109    Education Details Neck ROM, importance of posture when sitting, standing and lying down, deep breathing, walking program and importance of staying active throughout treatment, CURE article on staying active, "Why exercise?" flyer, lymphedema and PT info    Person(s) Educated Patient;Spouse    Methods Explanation;Handout    Comprehension Verbalized understanding               PT Long Term Goals - 09/14/19 1127      PT LONG TERM GOAL #1   Title Pt will be independent in self MLD for long term management of lymphedema.    Time 6    Period Weeks    Status New    Target Date 10/26/19      PT  LONG TERM GOAL #2   Title Pt will receive  appropriate compression garments for long term management of lymphedema    Time 6    Period Weeks    Status New    Target Date 10/26/19      PT LONG TERM GOAL #3   Title Pt will be independent in a home exercise program for continued strengthening and stretching.    Time 6    Period Weeks    Status New    Target Date 10/26/19              Head and Neck Clinic Goals - 09/14/19 1117      Patient will be able to verbalize understanding of a home exercise program for cervical range of motion, posture, and walking.    Status Achieved      Patient will be able to verbalize understanding of proper sitting and standing posture.    Status Achieved      Patient will be able to verbalize understanding of lymphedema risk and availability of treatment for this condition.    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 On 06/08/19 he underwent a resection of the floor of mouth cancer with partial glossectomy, bilateral neck dissection tracheostomy, mandibulectomy, plating, hyoid suspension bilaterally and left scapular free flap. He has started radiation and it will be completed on July 14th. Pt uses a straight cane for ambulation for short distances and a wheelchair for longer distances. He has a history of a stroke and back surgery and reports he has been weak since that time. Pt was only able to complete 9 sit to stands in 30 sec which is very poor for his age. He has limited left shoulder ROM due to left scapular free flap and pt has not been doing exercises from doctor for improving left shoulder ROM per wife. Pt also presents with chin swelling as a result of the surgies and radiation. Pt was educated about lymphedema today and will be scheduled for a follow up visit at outpatient PT to address this. Pt would  also benefit from skilled PT services to address bilateral LE weakness and decreased overall functional mobility to decrease fall risk.    Personal Factors and Comorbidities Comorbidity 3+;Fitness    Comorbidities stroke, back surgery, seizures, diabetes    Examination-Activity Limitations Reach Overhead    Examination-Participation Restrictions Community Activity;Yard Work    Stability/Clinical Decision Making Evolving/Moderate complexity    Clinical Decision Making Moderate    Rehab Potential Good    PT Frequency 2x / week    PT Duration 4 weeks   will not begin until the week of July 19   PT Treatment/Interventions ADLs/Self Care Home Management;Therapeutic exercise;Manual techniques;Manual lymph drainage;Compression bandaging;Taping;Passive range of motion;Scar mobilization;Vasopneumatic Device;Therapeutic activities;Neuromuscular re-education    PT Next Visit Plan begin MLD for head and neck, scar massage, see about pump coverage, add LE strengthening if pt agreeable, work on compression garment    PT Home Exercise Plan head and neck ROM exercises    Consulted and Agree with Plan of Care Patient           Patient will benefit from skilled therapeutic intervention in order to improve the following deficits and impairments:  Postural dysfunction, Impaired UE functional use,  Decreased range of motion, Decreased knowledge of precautions, Increased edema, Increased fascial restricitons, Decreased strength  Visit Diagnosis: Lymphedema, not elsewhere classified  Stiffness of left shoulder, not elsewhere classified  Muscle weakness (generalized)  Abnormal posture  Carcinoma of anterior part of floor of mouth [facility])     Problem List Patient Active Problem List   Diagnosis Date Noted  . Malnutrition of moderate degree 08/16/2019  . Hyperkalemia 08/15/2019  . Hyperglycemia 08/15/2019  . SIRS (systemic inflammatory response syndrome) ([place]) 08/15/2019  . AKI (acute kidney injury)  ([place]) 08/15/2019  . Oral candidiasis 08/15/2019  . Hypercalcemia 08/15/2019  . 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 ([place]) 08/08/2019  . Schizophrenia spectrum disorder with psychotic disorder type not yet determined ([place]) 09/24/2015  . Intermittent explosive disorder 09/20/2015  . Severe recurrent major depressive disorder with psychotic features ([place]) 09/20/2015  . Suicidal ideation 09/20/2015  . Homicidal ideation 09/20/2015  . Chest pain at rest 03/19/2013  . Acute non Q wave MI (myocardial infarction), initial episode of care ([place]) 03/06/2013  . Chronic sciatica of left side 09/10/2012  . Personality disorder ([place]) 11/10/2011  . Alcohol use disorder, mild, abuse 10/01/2011  . NSTEMI (non-ST elevated myocardial infarction) ([place]) 10/01/2011  . Erectile dysfunction 08/01/2010  . Urge incontinence of urine 08/01/2010  . Chronic alcoholic pancreatitis ([place]) 07/24/2010  . Tobacco use disorder 07/24/2010  . Hypertension 07/24/2010  . Hyperlipidemia 07/24/2010  . CVA (cerebral vascular accident) ([place]) 07/24/2010  . Chronic back pain 07/24/2010    Allyson Sabal [facility] 09/14/2019, 11:28 AM  [place], [place], [address] Phone: [phone]   Fax:  [phone]  Name: [name] MRN: [id number] Date of Birth: 15-Jan-1961  Manus Gunning, PT 09/14/19 11:28 AM

## 2019-09-14 NOTE — Patient Outreach (Signed)
[place] [facility]) Care Management  09/14/2019  [name] [facility] Dec 10, 1960 [id number]   [place] Management  Follow up call -Transition of care    Recent Hospital Admission at [facility] 6/1-08/17/19  Dx: Diabetes type 2 uncontrolled hyperglycemia glucose 520 on admission, hyperkalemia, PMX: Squamous cell carcinoma of oral activity s/p resection 06/08/19 with tracheostomy (decannulation on 08/03/19), PEG placement on 07/31/19, HTN, hyperlipidemia, depression, poorly controlled type 2 Diabetes, hemoglobin A1c 9.8% on 08/15/19   Subjective: Unsuccessful outreach call to patient, no answer able to leave a HIPAA compliant message for return call.    Plan Will plan return call in the next 4 business days     Joylene Draft, RN, BSN  [place] Management Coordinator  [phone]- Mobile [phone]- [place]

## 2019-09-14 NOTE — Progress Notes (Signed)
[place] CSW Progress Notes  Met w patient and wife in exam room during Head and Neck Clinic.  Pt states he is doing "all right", wife confirms that things are stable.  They are also caring for her bedbound mother at home, no agencies involved in helping w mother's care.  Will provide wife w information on [facility] for any possible help w caring for elderly family member at home in addition to caring for husband undergoing cancer treatment.  Edwyna Shell, LCSW Clinical Social Worker Phone:  [phone] Cell:  [phone]

## 2019-09-15 ENCOUNTER — Inpatient Hospital Stay: Payer: Medicare PPO | Attending: Radiation Oncology | Admitting: Nutrition

## 2019-09-15 ENCOUNTER — Other Ambulatory Visit: Payer: Self-pay

## 2019-09-15 ENCOUNTER — Inpatient Hospital Stay: Payer: Medicare PPO | Admitting: Medical

## 2019-09-15 ENCOUNTER — Ambulatory Visit
Admission: RE | Admit: 2019-09-15 | Discharge: 2019-09-15 | Disposition: A | Discharge status: Home / Self Care | Payer: No Typology Code available for payment source | Source: Ambulatory Visit | Attending: Radiation Oncology | Admitting: Radiation Oncology

## 2019-09-15 ENCOUNTER — Telehealth: Payer: Self-pay | Admitting: Emergency Medicine

## 2019-09-15 DIAGNOSIS — Y842 Radiological procedure and radiotherapy as the cause of abnormal reaction of the patient, or of later complication, without mention of misadventure at the time of the procedure: Secondary | ICD-10-CM | POA: Insufficient documentation

## 2019-09-15 DIAGNOSIS — Z8673 Personal history of transient ischemic attack (TIA), and cerebral infarction without residual deficits: Secondary | ICD-10-CM | POA: Insufficient documentation

## 2019-09-15 DIAGNOSIS — E1165 Type 2 diabetes mellitus with hyperglycemia: Secondary | ICD-10-CM | POA: Insufficient documentation

## 2019-09-15 DIAGNOSIS — I252 Old myocardial infarction: Secondary | ICD-10-CM | POA: Insufficient documentation

## 2019-09-15 DIAGNOSIS — I2510 Atherosclerotic heart disease of native coronary artery without angina pectoris: Secondary | ICD-10-CM | POA: Insufficient documentation

## 2019-09-15 DIAGNOSIS — Z87891 Personal history of nicotine dependence: Secondary | ICD-10-CM | POA: Insufficient documentation

## 2019-09-15 DIAGNOSIS — Z794 Long term (current) use of insulin: Secondary | ICD-10-CM | POA: Insufficient documentation

## 2019-09-15 DIAGNOSIS — C040 Malignant neoplasm of anterior floor of mouth: Secondary | ICD-10-CM | POA: Insufficient documentation

## 2019-09-15 DIAGNOSIS — Z79899 Other long term (current) drug therapy: Secondary | ICD-10-CM | POA: Insufficient documentation

## 2019-09-15 DIAGNOSIS — Z923 Personal history of irradiation: Secondary | ICD-10-CM | POA: Insufficient documentation

## 2019-09-15 DIAGNOSIS — R471 Dysarthria and anarthria: Secondary | ICD-10-CM | POA: Insufficient documentation

## 2019-09-15 DIAGNOSIS — Z510 Encounter for antineoplastic radiation therapy: Secondary | ICD-10-CM | POA: Diagnosis not present

## 2019-09-15 DIAGNOSIS — R05 Cough: Secondary | ICD-10-CM | POA: Insufficient documentation

## 2019-09-15 DIAGNOSIS — I10 Essential (primary) hypertension: Secondary | ICD-10-CM | POA: Insufficient documentation

## 2019-09-15 DIAGNOSIS — R630 Anorexia: Secondary | ICD-10-CM | POA: Insufficient documentation

## 2019-09-15 DIAGNOSIS — R269 Unspecified abnormalities of gait and mobility: Secondary | ICD-10-CM | POA: Insufficient documentation

## 2019-09-15 DIAGNOSIS — E785 Hyperlipidemia, unspecified: Secondary | ICD-10-CM | POA: Insufficient documentation

## 2019-09-15 NOTE — Progress Notes (Signed)
Nutrition follow-up completed with patient after radiation therapy for cancer of the oral cavity/floor of mouth. Weight improved to 130.4 pounds today up from 129.6 pounds June 25. Labs were reviewed.  Noted blood sugars are improved. Patient continues to tolerate [facility] 1.4, 1 carton 5 times daily with 60 mL of free water before and after bolus feeding.  He also tolerates Protostat, 30 mL twice a day via G-tube. This totals 2475 cal, 130 g protein, 275 g carbohydrate.   Patient is continuing to drink some water by mouth as well as shakes and smoothies.  Estimated nutrition needs: 2400-2600 cal, 120-135 g protein, 2.5 L fluid.  Nutrition diagnosis: Moderate malnutrition improving.  Intervention: Continue tube feedings and protein supplements as above. Recommended patient continue to drink something by mouth throughout the day. Provided some additional oral nutrition supplements. Encourage patient to continue swallowing exercises.  Monitoring, evaluation, goals: Patient will continue to tolerate adequate calories and protein to minimize weight loss.  Next visit: Friday, June 9 after radiation therapy.  **Disclaimer: This note was dictated with voice recognition software. Similar sounding words can inadvertently be transcribed and this note may contain transcription errors which may not have been corrected upon publication of note.**

## 2019-09-15 NOTE — Telephone Encounter (Signed)
Per PA Lucianne Lei [name] does not need to come in for in-person visit today, appt scheduled for today is from previous tx plan.  Attempted to contact [name] to let him know he doesn't need to come in for his appt today, left VM with this info and f/u info.

## 2019-09-19 ENCOUNTER — Other Ambulatory Visit: Payer: Self-pay | Admitting: Radiation Oncology

## 2019-09-19 ENCOUNTER — Other Ambulatory Visit: Payer: Self-pay

## 2019-09-19 ENCOUNTER — Ambulatory Visit
Admission: RE | Admit: 2019-09-19 | Discharge: 2019-09-19 | Disposition: A | Discharge status: Home / Self Care | Payer: No Typology Code available for payment source | Source: Ambulatory Visit | Attending: Radiation Oncology | Admitting: Radiation Oncology

## 2019-09-19 ENCOUNTER — Other Ambulatory Visit: Payer: Self-pay | Admitting: *Deleted

## 2019-09-19 DIAGNOSIS — Z510 Encounter for antineoplastic radiation therapy: Secondary | ICD-10-CM | POA: Diagnosis not present

## 2019-09-19 DIAGNOSIS — C040 Malignant neoplasm of anterior floor of mouth: Secondary | ICD-10-CM

## 2019-09-19 LAB — GLUCOSE, CAPILLARY: Glucose-Capillary: 341 mg/dL — ABNORMAL HIGH (ref 70–99)

## 2019-09-19 MED ORDER — SCOPOLAMINE 1 MG/3DAYS TD PT72: 1.0000 | MEDICATED_PATCH | TRANSDERMAL | 1 refills | Status: DC

## 2019-09-19 NOTE — Patient Outreach (Signed)
[facility]) Care Management  [place]  09/19/2019   Merik Mignano [facility] 1961/01/13 [id number]   [facility] Care Management  Follow up call -Transition of care    [place] [facility] at [facility] 6/1-08/17/19  Dx: Diabetes type 2 uncontrolled hyperglycemia glucose 520 on admission, hyperkalemia, PMX: Squamous cell carcinoma of oral activity s/p resection 06/08/19 with tracheostomy (decannulation on 08/03/19), PEG placement on 07/31/19, HTN, hyperlipidemia, depression, poorly controlled type 2 Diabetes, hemoglobin A1c 9.8% on 08/15/19  Outreach attempt #2 Subjective: Unsuccessful outreach call to patient, no answer able to leave a HIPAA compliant message for return call.    Plan Will plan return call in the next 4 business days  Will send unsuccessful outreach letter after 2nd unsuccessful outreach .   Joylene Draft, RN, BSN  [place] Management Coordinator  [phone]- Mobile [phone]- Toll Free Main Office

## 2019-09-20 ENCOUNTER — Other Ambulatory Visit: Payer: Self-pay

## 2019-09-20 ENCOUNTER — Ambulatory Visit
Admission: RE | Admit: 2019-09-20 | Discharge: 2019-09-20 | Disposition: A | Discharge status: Home / Self Care | Payer: No Typology Code available for payment source | Source: Ambulatory Visit | Attending: Radiation Oncology | Admitting: Radiation Oncology

## 2019-09-20 ENCOUNTER — Inpatient Hospital Stay: Payer: Medicare PPO | Admitting: Medical

## 2019-09-20 DIAGNOSIS — Z510 Encounter for antineoplastic radiation therapy: Secondary | ICD-10-CM | POA: Diagnosis not present

## 2019-09-20 DIAGNOSIS — C040 Malignant neoplasm of anterior floor of mouth: Secondary | ICD-10-CM | POA: Diagnosis not present

## 2019-09-21 ENCOUNTER — Encounter: Payer: Medicare PPO | Admitting: Nutrition

## 2019-09-21 ENCOUNTER — Other Ambulatory Visit: Payer: Self-pay

## 2019-09-21 ENCOUNTER — Ambulatory Visit
Admission: RE | Admit: 2019-09-21 | Discharge: 2019-09-21 | Disposition: A | Discharge status: Home / Self Care | Payer: No Typology Code available for payment source | Source: Ambulatory Visit | Attending: Radiation Oncology | Admitting: Radiation Oncology

## 2019-09-21 DIAGNOSIS — Z510 Encounter for antineoplastic radiation therapy: Secondary | ICD-10-CM | POA: Diagnosis not present

## 2019-09-21 DIAGNOSIS — C040 Malignant neoplasm of anterior floor of mouth: Secondary | ICD-10-CM | POA: Diagnosis not present

## 2019-09-22 ENCOUNTER — Other Ambulatory Visit: Payer: Self-pay | Admitting: *Deleted

## 2019-09-22 ENCOUNTER — Other Ambulatory Visit: Payer: Self-pay

## 2019-09-22 ENCOUNTER — Ambulatory Visit
Admission: RE | Admit: 2019-09-22 | Discharge: 2019-09-22 | Disposition: A | Discharge status: Home / Self Care | Payer: No Typology Code available for payment source | Source: Ambulatory Visit | Attending: Radiation Oncology | Admitting: Radiation Oncology

## 2019-09-22 ENCOUNTER — Inpatient Hospital Stay: Payer: Medicare PPO | Admitting: Nutrition

## 2019-09-22 DIAGNOSIS — Z510 Encounter for antineoplastic radiation therapy: Secondary | ICD-10-CM | POA: Diagnosis not present

## 2019-09-22 NOTE — Progress Notes (Signed)
Nutrition follow-up completed with patient and wife after radiation therapy for cancer of the oral cavity/floor of mouth. Weight is stable at 129.4 pounds.  He has completed 17 out of 20 radiation therapy treatments. Noted glucose is elevated at 341.  Patient reports adjusting insulin. Patient is not eating foods or drinking liquids by mouth other than sips of water. He tolerates [employer] 1.4, 1 carton 5 times daily with 60 mL of free water before and after each bolus feeding.  He also tolerates Protostat, 30 mL twice a day via G-tube. Tube feeding regimen provides 2475 cal, 130 g protein, 275 g carbohydrate.  Estimated nutrition needs: 2400-2600 cal, 120-135 g protein, 2.5 L fluid.  Nutrition diagnosis: Moderate malnutrition improving.  Intervention: Patient educated to continue tube feedings of [place] along with [employer] as directed. Encourage patient to continue trying to swallow water and eventually adding in other liquids as tolerated. Brief education provided on advancing to soft moist foods. Patient should not decrease tube feeding until seen by RD for adequate oral intake. Patient requesting telephone follow-up be completed after his wife gets home from work at their home phone number. [phone]) he has great difficulty talking secondary thick thickened saliva.  Monitoring, evaluation, goals: Patient will continue tube feeding and water by mouth.  Will monitor weights and intake.  Next visit: Tuesday, July 20 at 4 PM by telephone.  **Disclaimer: This note was dictated with voice recognition software. Similar sounding words can inadvertently be transcribed and this note may contain transcription errors which may not have been corrected upon publication of note.**

## 2019-09-22 NOTE — Patient Outreach (Signed)
[facility] [facility]) Care Management  09/22/2019  [name] [facility] 09/17/1960 [id number]   [facility] Care Management  Follow up call -Transition of care    Recent Hospital Admission at [facility] 6/1-08/17/19  Dx: Diabetes type 2 uncontrolled hyperglycemia glucose 520 on admission, hyperkalemia, PMX: Squamous cell carcinoma of oral activity s/p resection 06/08/19 with tracheostomy (decannulation on 08/03/19), PEG placement on 07/31/19, HTN, hyperlipidemia, depression, poorly controlled type 2 Diabetes, hemoglobin A1c 9.8% on 08/15/19  Outreach attempt #2 Subjective: Unsuccessful outreach call to patient, no answer able to leave a HIPAA compliant message for return call.   Plan  No return call from patient in the last 3 outreaches, will plan case closure on day 10, noting patient PCP is within [facility] administration of [place] ,not eligible for [facility] care management services.    Joylene Draft, RN, BSN  [place] Management Coordinator  [phone]- Mobile [phone]- Toll Free Main Office

## 2019-09-25 ENCOUNTER — Other Ambulatory Visit: Payer: Self-pay

## 2019-09-25 ENCOUNTER — Ambulatory Visit
Admission: RE | Admit: 2019-09-25 | Discharge: 2019-09-25 | Disposition: A | Discharge status: Home / Self Care | Payer: No Typology Code available for payment source | Source: Ambulatory Visit | Attending: Radiation Oncology | Admitting: Radiation Oncology

## 2019-09-25 ENCOUNTER — Other Ambulatory Visit: Payer: Self-pay | Admitting: Radiation Oncology

## 2019-09-25 DIAGNOSIS — C040 Malignant neoplasm of anterior floor of mouth: Secondary | ICD-10-CM

## 2019-09-25 DIAGNOSIS — Z510 Encounter for antineoplastic radiation therapy: Secondary | ICD-10-CM | POA: Diagnosis not present

## 2019-09-25 LAB — GLUCOSE, CAPILLARY: Glucose-Capillary: 383 mg/dL — ABNORMAL HIGH (ref 70–99)

## 2019-09-25 MED ORDER — FLUCONAZOLE 40 MG/ML PO SUSR: ORAL | 0 refills | Status: AC

## 2019-09-26 ENCOUNTER — Ambulatory Visit
Admission: RE | Admit: 2019-09-26 | Discharge: 2019-09-26 | Disposition: A | Discharge status: Home / Self Care | Payer: No Typology Code available for payment source | Source: Ambulatory Visit | Attending: Radiation Oncology | Admitting: Radiation Oncology

## 2019-09-26 ENCOUNTER — Other Ambulatory Visit: Payer: Self-pay

## 2019-09-26 DIAGNOSIS — Z510 Encounter for antineoplastic radiation therapy: Secondary | ICD-10-CM | POA: Diagnosis not present

## 2019-09-26 DIAGNOSIS — C040 Malignant neoplasm of anterior floor of mouth: Secondary | ICD-10-CM | POA: Diagnosis not present

## 2019-09-26 NOTE — Progress Notes (Signed)
Sympt[facility] Management Clinic Progress Note   [name] [id number] 27-Feb-1961 59 y.o.  [name] is managed by Dr. 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 He is scheduled to see Dr. Isidore Moos in follow up on 10/11/2019.  He is scheduled to see speech pathology on 10/02/2019.  Hyperglycemia in a setting of diabetes: A capillary blood glucose returned at 408 today.  The patient was told to continue Levemir as instructed by his primary care provider.  Additionally he was given a sliding scale of NovoLog insulin.  He has a phone follow-up visit with the [facility] scheduled.  Please see After Visit Summary for patient specific instructions.  Future Appointments  Date Time Provider [place]  09/27/2019  8:30 AM [facility] [id number] [facility] None  09/27/2019  9:00 AM Sandi Mealy E., PA-C [facility] None  10/02/2019 11:00 AM Sharen Counter, CCC-SLP [facility]  10/03/2019  4:00 PM Karie Mainland, RD [facility] None  10/04/2019  3:00 PM Wynelle Beckmann, Blaire L, PT [facility] None  10/09/2019 11:00 AM Breedlove [place], Blaire L, PT [facility] None  10/11/2019  1:00 PM Breedlove [place], Joaquin L, PT [facility] None  10/11/2019  3:50 PM Eppie Gibson, MD [facility] None  10/11/2019  4:00 PM Karie Mainland, RD [facility] None  10/16/2019  8:00 AM Wynelle Beckmann, Blaire L, PT [facility] None  10/18/2019  8:00 AM [place], Blaire L, PT [facility] None    No orders of the defined types were placed in this encounter.      Subjective:   Patient ID:  [name] is a 59 y.o. (DOB 1960-05-14) male.  Chief Complaint: No chief complaint on  file.   HPI [name]  is a 59 y.o. male with a diagnosis of a cancer of the anterior floor of the mouth: [name] is status post completion of radiation therapy today.  He has a follow-up visit with Dr. Isidore Moos on 10/11/2019.  He is being seen by speech pathology on 10/02/2019.  He reports that he continues to have anorexia and is coughing up phlegm.  He continues to do his feedings via his PEG tube and has no issues with his PEG tube.  He denies dizziness, weakness, nausea, vomiting, diarrhea, constipation, fevers, chills, or sweats.  He has a phone visit with his primary care provider at the [place] which is upcoming.  He continues using Levemir.  Despite this his blood sugar continues to be in the 300s.  Medications: I have reviewed the patient's current medications.  Allergies:  Allergies  Allergen Reactions  . Morphine And Related Itching    Past Medical History:  Diagnosis Date  . Anxiety   . Cancer ([place])    mouth  . Chronic pancreatitis ([place])   . Coronary artery disease   . Depression   . Diabetes mellitus without complication ([place])   . Hyperlipidemia   . Hypertension   . Myocardial infarction ([place])   . Neuromuscular disorder ([place])   . Sciatica   . Seizures ([place])   . Stroke ([place])   . Substance abuse [facility])     Past Surgical History:  Procedure Laterality Date  . BACK SURGERY    . CORONARY  STENT PLACEMENT    . GASTROSTOMY TUBE PLACEMENT Left 07/31/2019   [facility]  . LEFT HEART CATHETERIZATION WITH CORONARY ANGIOGRAM N/A 10/01/2011   Procedure: LEFT HEART CATHETERIZATION WITH CORONARY ANGIOGRAM;  Surgeon: Clent Demark, MD;  Location: [facility] CATH LAB;  Service: Cardiovascular;  Laterality: N/A;  . LEFT HEART CATHETERIZATION WITH CORONARY ANGIOGRAM N/A 03/06/2013   Procedure: LEFT HEART CATHETERIZATION WITH CORONARY ANGIOGRAM;  Surgeon: Clent Demark, MD;  Location: [place] CATH LAB;  Service: Cardiovascular;  Laterality: N/A;  . PERCUTANEOUS CORONARY STENT INTERVENTION  (PCI-S) N/A 10/02/2011   Procedure: PERCUTANEOUS CORONARY STENT INTERVENTION (PCI-S);  Surgeon: Clent Demark, MD;  Location: [facility] CATH LAB;  Service: Cardiovascular;  Laterality: N/A;  . TRACHEOSTOMY CLOSURE      Family History  Family history unknown: Yes    Social History   Socioeconomic History  . Marital status: Married    Spouse name: Not on file  . Number of children: 1  . Years of education: Not on file  . Highest education level: Not on file  Occupational History  . Not on file  Tobacco Use  . Smoking status: Former Smoker    Packs/day: 1.00    Years: 40.00    Pack years: 40.00    Types: Cigarettes  . Smokeless tobacco: Never Used  Vaping Use  . Vaping Use: Never used  Substance and Sexual Activity  . Alcohol use: Not Currently  . Drug use: Never  . Sexual activity: Not on file  Other Topics Concern  . Not on file  Social History Narrative   The patient is married. The patient has a 52 year old daughter.  He is currently unemployed, and  has a high school degree.        He has a history of 1- 1.5 pack per day smoking of many years.  He states he has quit smoking (07/2019)      He has a history of heavy alcohol use, states typically drinking a fifth to a pint of gin daily, after stroke in 06/2010, decreased to one pint of gin per week, and after hospitalization for acute on chronic pancreatitis, was abstinent for at least 1-2 weeks (from 5/7 to 5/17)        He denies any illicit drug use.    Social Determinants of Health   Financial Resource Strain: Low Risk   . Difficulty of Paying Living Expenses: Not hard at all  Food Insecurity: No Food Insecurity  . Worried About Charity fundraiser in the Last Year: Never true  . Ran Out of Food in the Last Year: Never true  Transportation Needs: No Transportation Needs  . Lack of Transportation (Medical): No  . Lack of Transportation (Non-Medical): No  Physical Activity:   . Days of Exercise per Week:   . Minutes of  Exercise per Session:   Stress:   . Feeling of Stress :   Social Connections:   . Frequency of Communication with Friends and Family:   . Frequency of Social Gatherings with Friends and Family:   . Attends Religious Services:   . Active Member of Clubs or Organizations:   . Attends Archivist Meetings:   Marland Kitchen Marital Status:   Intimate Partner Violence:   . Fear of Current or Ex-Partner:   . Emotionally Abused:   Marland Kitchen Physically Abused:   . Sexually Abused:     Past Medical History, Surgical history, Social history, and Family history were reviewed and updated as appropriate.  Please see review of systems for further details on the patient's review from today.   Review of Systems:  Review of Systems  Constitutional: Positive for activity change. Negative for chills, diaphoresis and fever.  HENT: Positive for trouble swallowing and voice change.   Respiratory: Positive for cough. Negative for chest tightness, shortness of breath and wheezing.   Cardiovascular: Negative for chest pain and palpitations.  Gastrointestinal: Negative for abdominal pain, constipation, diarrhea, nausea and vomiting.  Musculoskeletal: Negative for back pain and myalgias.  Neurological: Negative for dizziness, light-headedness and headaches.    Objective:   Physical Exam:  There were no vitals taken for this visit. ECOG: 1  Physical Exam Constitutional:      Appearance: Normal appearance.     Comments: Patient is an adult male who has dysarthria secondary to radiation therapy but who appears to be in no acute distress.  HENT:     Head: Normocephalic and atraumatic.  Eyes:     General: No scleral icterus.       Right eye: No discharge.        Left eye: No discharge.     Conjunctiva/sclera: Conjunctivae normal.  Skin:    General: Skin is warm and dry.  Neurological:     Mental Status: He is alert.     Coordination: Coordination normal.     Gait: Gait abnormal (The patient is ambulating  with the use of a wheelchair.).  Psychiatric:        Mood and Affect: Mood normal.        Behavior: Behavior normal.        Thought Content: Thought content normal.        Judgment: Judgment normal.     Lab Review:     Component Value Date/Time   NA 138 09/07/2019 1125   K 5.2 (H) 09/07/2019 1125   CL 100 09/07/2019 1125   CO2 27 09/07/2019 1125   GLUCOSE 323 (H) 09/07/2019 1125   BUN 25 (H) 09/07/2019 1125   CREATININE 0.87 09/07/2019 1125   CALCIUM 9.6 09/07/2019 1125   PROT 7.7 09/07/2019 1125   ALBUMIN 3.4 (L) 09/07/2019 1125   AST 16 09/07/2019 1125   ALT 19 09/07/2019 1125   ALKPHOS 108 09/07/2019 1125   BILITOT <0.2 (L) 09/07/2019 1125   GFRNONAA >60 09/07/2019 1125   GFRAA >60 09/07/2019 1125       Component Value Date/Time   WBC 5.7 09/07/2019 1125   WBC 10.0 08/17/2019 0501   RBC 3.89 (L) 09/07/2019 1125   HGB 10.6 (L) 09/07/2019 1125   HCT 34.6 (L) 09/07/2019 1125   PLT 279 09/07/2019 1125   MCV 88.9 09/07/2019 1125   MCV 90.8 07/12/2017 0944   MCH 27.2 09/07/2019 1125   MCHC 30.6 09/07/2019 1125   RDW 15.5 09/07/2019 1125   LYMPHSABS 1.2 09/07/2019 1125   MONOABS 0.5 09/07/2019 1125   EOSABS 0.3 09/07/2019 1125   BASOSABS 0.0 09/07/2019 1125   -------------------------------  Imaging from last 24 hours (if applicable):  Radiology interpretation: No results found.

## 2019-09-27 ENCOUNTER — Other Ambulatory Visit: Payer: Self-pay

## 2019-09-27 ENCOUNTER — Ambulatory Visit
Admission: RE | Admit: 2019-09-27 | Discharge: 2019-09-27 | Disposition: A | Discharge status: Home / Self Care | Payer: No Typology Code available for payment source | Source: Ambulatory Visit | Attending: Radiation Oncology | Admitting: Radiation Oncology

## 2019-09-27 ENCOUNTER — Encounter: Payer: Self-pay | Admitting: Radiation Oncology

## 2019-09-27 ENCOUNTER — Other Ambulatory Visit: Payer: Self-pay | Admitting: *Deleted

## 2019-09-27 ENCOUNTER — Encounter: Payer: Medicare PPO | Admitting: Nutrition

## 2019-09-27 ENCOUNTER — Inpatient Hospital Stay (HOSPITAL_BASED_OUTPATIENT_CLINIC_OR_DEPARTMENT_OTHER): Payer: Medicare PPO | Admitting: Medical

## 2019-09-27 ENCOUNTER — Encounter: Payer: Self-pay | Admitting: Medical

## 2019-09-27 VITALS — BP 98/65 | HR 95 | Temp 98.6°F | Resp 20 | Wt 127.0 lb

## 2019-09-27 DIAGNOSIS — R630 Anorexia: Secondary | ICD-10-CM | POA: Diagnosis not present

## 2019-09-27 DIAGNOSIS — Z79899 Other long term (current) drug therapy: Secondary | ICD-10-CM | POA: Diagnosis not present

## 2019-09-27 DIAGNOSIS — Y842 Radiological procedure and radiotherapy as the cause of abnormal reaction of the patient, or of later complication, without mention of misadventure at the time of the procedure: Secondary | ICD-10-CM | POA: Diagnosis not present

## 2019-09-27 DIAGNOSIS — C040 Malignant neoplasm of anterior floor of mouth: Secondary | ICD-10-CM

## 2019-09-27 DIAGNOSIS — E0821 Diabetes mellitus due to underlying condition with diabetic nephropathy: Secondary | ICD-10-CM | POA: Diagnosis not present

## 2019-09-27 DIAGNOSIS — R471 Dysarthria and anarthria: Secondary | ICD-10-CM | POA: Diagnosis not present

## 2019-09-27 DIAGNOSIS — Z923 Personal history of irradiation: Secondary | ICD-10-CM | POA: Diagnosis not present

## 2019-09-27 DIAGNOSIS — I10 Essential (primary) hypertension: Secondary | ICD-10-CM | POA: Diagnosis not present

## 2019-09-27 DIAGNOSIS — I252 Old myocardial infarction: Secondary | ICD-10-CM | POA: Diagnosis not present

## 2019-09-27 DIAGNOSIS — E1165 Type 2 diabetes mellitus with hyperglycemia: Secondary | ICD-10-CM | POA: Diagnosis not present

## 2019-09-27 DIAGNOSIS — R05 Cough: Secondary | ICD-10-CM | POA: Diagnosis not present

## 2019-09-27 DIAGNOSIS — I2510 Atherosclerotic heart disease of native coronary artery without angina pectoris: Secondary | ICD-10-CM | POA: Diagnosis not present

## 2019-09-27 DIAGNOSIS — Z8673 Personal history of transient ischemic attack (TIA), and cerebral infarction without residual deficits: Secondary | ICD-10-CM | POA: Diagnosis not present

## 2019-09-27 DIAGNOSIS — R269 Unspecified abnormalities of gait and mobility: Secondary | ICD-10-CM | POA: Diagnosis not present

## 2019-09-27 DIAGNOSIS — Z87891 Personal history of nicotine dependence: Secondary | ICD-10-CM | POA: Diagnosis not present

## 2019-09-27 DIAGNOSIS — Z794 Long term (current) use of insulin: Secondary | ICD-10-CM

## 2019-09-27 DIAGNOSIS — Z510 Encounter for antineoplastic radiation therapy: Secondary | ICD-10-CM | POA: Diagnosis not present

## 2019-09-27 DIAGNOSIS — E785 Hyperlipidemia, unspecified: Secondary | ICD-10-CM | POA: Diagnosis not present

## 2019-09-27 LAB — GLUCOSE, CAPILLARY: Glucose-Capillary: 408 mg/dL — ABNORMAL HIGH (ref 70–99)

## 2019-09-27 MED ORDER — INSULIN ISOPHANE & REGULAR (HUMAN 70-30)100 UNIT/ML KWIKPEN: PEN_INJECTOR | SUBCUTANEOUS | 11 refills | Status: DC

## 2019-09-27 MED ORDER — INSULIN REGULAR HUMAN 100 UNIT/ML IJ SOLN
INTRAMUSCULAR | Status: AC
  Filled 2019-09-27: qty 1

## 2019-09-27 MED ORDER — INSULIN ISOPHANE & REGULAR (HUMAN 70-30)100 UNIT/ML KWIKPEN: PEN_INJECTOR | SUBCUTANEOUS | 11 refills | Status: AC

## 2019-09-27 MED ORDER — INSULIN REGULAR HUMAN 100 UNIT/ML IJ SOLN
10.0000 [IU] | Freq: Once | INTRAMUSCULAR | Status: AC
  Administered 2019-09-27: 10 [IU] via SUBCUTANEOUS

## 2019-09-27 NOTE — Progress Notes (Signed)
Blood sugar taken at 0921: 408 PA Lucianne Lei made aware.  Received VM from pt's wife stating the [facility] requires prescription written by PA Lucianne Lei for insulin to be faxed to them in order to be filled.  Order faxed to [phone], marked as received.  Wife aware to call back as needed with any further prescription/other issues.

## 2019-09-27 NOTE — Patient Outreach (Signed)
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 Patient was able to follow up with PCP at  [facility] .    Unable to complete 30 day assessment,unsuccessful outreaches.. Identified PCP is not in network ,Hoy Finlay is provider at [facility] at [place]. Patient is currently followed at [facility] for Radiation treatment noted final treatment on today.   Plan  Will plan [facility] care management case closure, patient Primary care provider not in network.    Joylene Draft, RN, BSN  [place] Management Coordinator  [phone]- Mobile [phone]- Toll Free Main Office

## 2019-09-27 NOTE — Progress Notes (Signed)
Oncology Nurse Navigator Documentation  Met with [name] and his wife after final RT to offer support and to celebrate end of radiation treatment.   Provided verbal/written post-RT guidance:  Importance of keeping all follow-up appts, especially those with Nutrition and SLP.  Importance of protecting treatment area from sun.  Continuation of Sonafine application 2-3 times daily, application of antibiotic ointment to areas of raw skin; when supply of Sonafine exhausted transition to OTC lotion with vitamin E. Provided/reviewed Epic calendar of upcoming appts. Explained my role as navigator will continue for several more months, encouraged him to call me with needs/concerns.    Harlow Asa RN, BSN, OCN Head & Neck Oncology Nurse [place] at [facility] Phone # [phone]  Fax # [phone]

## 2019-09-27 NOTE — Patient Instructions (Signed)
REGULAR SLIDING SCALE INSULIN COVERAGE  Blood Sugar Result    Regular Insulin Coverage SubQ   60 - 124      No Coverage 125 - 150      2 units of Humalog Insulin subq 151 - 200      4 units of Humalog Insulin subq 201 - 250      6 units of Humalog Insulin subq 251 - 300      8 units of Humalog Insulin subq 301 - 350      10 units of Humalog Insulin subq 351 - 400      12 units of Humalog Insulin subq   Call MD if blood sugar is less than 60 or greater than 400

## 2019-09-28 ENCOUNTER — Ambulatory Visit: Payer: No Typology Code available for payment source

## 2019-09-29 ENCOUNTER — Ambulatory Visit: Payer: No Typology Code available for payment source

## 2019-09-29 ENCOUNTER — Encounter (HOSPITAL_COMMUNITY): Payer: Self-pay | Admitting: Emergency Medicine

## 2019-09-29 ENCOUNTER — Emergency Department (HOSPITAL_COMMUNITY)
Admission: EM | Admit: 2019-09-29 | Discharge: 2019-09-29 | Disposition: A | Discharge status: Home / Self Care | Payer: No Typology Code available for payment source | Attending: Emergency Medicine | Admitting: Emergency Medicine

## 2019-09-29 ENCOUNTER — Other Ambulatory Visit: Payer: Self-pay

## 2019-09-29 DIAGNOSIS — Z8673 Personal history of transient ischemic attack (TIA), and cerebral infarction without residual deficits: Secondary | ICD-10-CM | POA: Diagnosis not present

## 2019-09-29 DIAGNOSIS — I2510 Atherosclerotic heart disease of native coronary artery without angina pectoris: Secondary | ICD-10-CM | POA: Diagnosis not present

## 2019-09-29 DIAGNOSIS — Z955 Presence of coronary angioplasty implant and graft: Secondary | ICD-10-CM | POA: Diagnosis not present

## 2019-09-29 DIAGNOSIS — Z7982 Long term (current) use of aspirin: Secondary | ICD-10-CM | POA: Diagnosis not present

## 2019-09-29 DIAGNOSIS — Z794 Long term (current) use of insulin: Secondary | ICD-10-CM

## 2019-09-29 DIAGNOSIS — E1165 Type 2 diabetes mellitus with hyperglycemia: Secondary | ICD-10-CM | POA: Insufficient documentation

## 2019-09-29 DIAGNOSIS — Z87891 Personal history of nicotine dependence: Secondary | ICD-10-CM | POA: Insufficient documentation

## 2019-09-29 DIAGNOSIS — I10 Essential (primary) hypertension: Secondary | ICD-10-CM | POA: Insufficient documentation

## 2019-09-29 DIAGNOSIS — R739 Hyperglycemia, unspecified: Secondary | ICD-10-CM | POA: Diagnosis present

## 2019-09-29 DIAGNOSIS — Z79899 Other long term (current) drug therapy: Secondary | ICD-10-CM | POA: Insufficient documentation

## 2019-09-29 DIAGNOSIS — Z85818 Personal history of malignant neoplasm of other sites of lip, oral cavity, and pharynx: Secondary | ICD-10-CM | POA: Insufficient documentation

## 2019-09-29 LAB — BASIC METABOLIC PANEL
Anion gap: 10 (ref 5–15)
BUN: 23 mg/dL — ABNORMAL HIGH (ref 6–20)
CO2: 29 mmol/L (ref 22–32)
Calcium: 9.8 mg/dL (ref 8.9–10.3)
Chloride: 100 mmol/L (ref 98–111)
Creatinine, Ser: 0.66 mg/dL (ref 0.61–1.24)
GFR calc Af Amer: >60 mL/min (ref >60)
GFR calc non Af Amer: >60 mL/min (ref >60)
Glucose, Bld: 339 mg/dL — ABNORMAL HIGH (ref 70–99)
Potassium: 4.2 mmol/L (ref 3.5–5.1)
Sodium: 139 mmol/L (ref 135–145)

## 2019-09-29 LAB — CBC
HCT: 35.9 % — ABNORMAL LOW (ref 39.0–52.0)
Hemoglobin: 11.5 g/dL — ABNORMAL LOW (ref 13.0–17.0)
MCH: 28.2 pg (ref 26.0–34.0)
MCHC: 32 g/dL (ref 30.0–36.0)
MCV: 88 fL (ref 80.0–100.0)
Platelets: 373 10*3/uL (ref 150–400)
RBC: 4.08 MIL/uL — ABNORMAL LOW (ref 4.22–5.81)
RDW: 14.1 % (ref 11.5–15.5)
WBC: 7.3 10*3/uL (ref 4.0–10.5)
nRBC: 0 % (ref 0.0–0.2)

## 2019-09-29 LAB — URINALYSIS, ROUTINE W REFLEX MICROSCOPIC
Bacteria, UA: NONE SEEN
Bilirubin Urine: NEGATIVE
Glucose, UA: >=500 mg/dL — AB
Hgb urine dipstick: NEGATIVE
Ketones, ur: 5 mg/dL — AB
Leukocytes,Ua: NEGATIVE
Nitrite: NEGATIVE
Protein, ur: NEGATIVE mg/dL
Specific Gravity, Urine: 1.038 — ABNORMAL HIGH (ref 1.005–1.030)
pH: 5 (ref 5.0–8.0)

## 2019-09-29 LAB — CBG MONITORING, ED
Glucose-Capillary: 382 mg/dL — ABNORMAL HIGH (ref 70–99)
Glucose-Capillary: 257 mg/dL — ABNORMAL HIGH (ref 70–99)

## 2019-09-29 MED ORDER — SODIUM CHLORIDE 0.9 % IV BOLUS
1000.0000 mL | Freq: Once | INTRAVENOUS | Status: AC
  Administered 2019-09-29: 1000 mL via INTRAVENOUS

## 2019-09-29 NOTE — ED Notes (Signed)
I sent a urine culture to the main lab

## 2019-09-29 NOTE — ED Triage Notes (Signed)
Per pt/family-states [facility] for [facility] CBG-in the 400's-recently treated for cancer-has a feeding tube-has not taken any insulin today

## 2019-09-29 NOTE — ED Provider Notes (Signed)
[name]   CSN: [id number] Arrival date & time: 09/29/19  1552     History Chief Complaint  Patient presents with  . Hyperglycemia    [name] is a 59 y.o. male w PMHx mouth cancer, CVA, HTN, substance abuse, chronic pancreatitis, insulin-dependent T2DM, presenting to the ED with complaint of hyperglycemia.  Patient and his wife provide history.  She states he was seen at the [place] today at his PCP, and blood sugar was over 400 therefore sent to the ED for evaluation.  She states they recently started him on sliding scale insulin today, however he has not begun this treatment yet.  He is otherwise compliant with his medications.  He states he has had some polyuria and polydipsia though no abdominal pain, nausea, vomiting, shortness of breath, or other associated symptoms.  He had surgery for mouth cancer in March and just completed a 20-day radiation cycle on Wednesday of this week.  He overall all has no complaints.  He has a feeding tube in place for nutrition, where he takes in most of his daily intake.  He has significant dysphasia due to his mouth cancer.  He is able to drink some p.o. water.  The history is provided by the patient and the spouse.       Past Medical History:  Diagnosis Date  . Anxiety   . Cancer ([place])    mouth  . Chronic pancreatitis ([place])   . Coronary artery disease   . Depression   . Diabetes mellitus without complication ([place])   . Hyperlipidemia   . Hypertension   . Myocardial infarction ([place])   . Neuromuscular disorder ([place])   . Sciatica   . Seizures ([place])   . Stroke ([place])   . Substance abuse [facility])     Patient Active Problem List   Diagnosis Date Noted  . Diabetes mellitus due to underlying condition with diabetic nephropathy, with long-term current use of insulin ([place]) 09/27/2019  . Malnutrition of moderate degree 08/16/2019  . Hyperkalemia 08/15/2019  . Hyperglycemia 08/15/2019  .  SIRS (systemic inflammatory response syndrome) ([place]) 08/15/2019  . AKI (acute kidney injury) ([place]) 08/15/2019  . Oral candidiasis 08/15/2019  . Hypercalcemia 08/15/2019  . 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 ([place]) 08/08/2019  . Schizophrenia spectrum disorder with psychotic disorder type not yet determined ([place]) 09/24/2015  . Intermittent explosive disorder 09/20/2015  . Severe recurrent major depressive disorder with psychotic features ([place]) 09/20/2015  . Suicidal ideation 09/20/2015  . Homicidal ideation 09/20/2015  . Chest pain at rest 03/19/2013  . Acute non Q wave MI (myocardial infarction), initial episode of care ([place]) 03/06/2013  . Chronic sciatica of left side 09/10/2012  . Personality disorder ([place]) 11/10/2011  . Alcohol use disorder, mild, abuse 10/01/2011  . NSTEMI (non-ST elevated myocardial infarction) ([place]) 10/01/2011  . Erectile dysfunction 08/01/2010  . Urge incontinence of urine 08/01/2010  . Chronic alcoholic pancreatitis ([place]) 07/24/2010  . Tobacco use disorder 07/24/2010  . Hypertension 07/24/2010  . Hyperlipidemia 07/24/2010  . CVA (cerebral vascular accident) ([place]) 07/24/2010  . Chronic back pain 07/24/2010    Past Surgical History:  Procedure Laterality Date  . BACK SURGERY    . CORONARY STENT PLACEMENT    . GASTROSTOMY TUBE PLACEMENT Left 07/31/2019   [facility]  . LEFT HEART CATHETERIZATION WITH CORONARY ANGIOGRAM N/A 10/01/2011   Procedure: LEFT HEART CATHETERIZATION WITH CORONARY ANGIOGRAM;  Surgeon: Clent Demark, MD;  Location: [facility]  CATH LAB;  Service: Cardiovascular;  Laterality: N/A;  . LEFT HEART CATHETERIZATION WITH CORONARY ANGIOGRAM N/A 03/06/2013   Procedure: LEFT HEART CATHETERIZATION WITH CORONARY ANGIOGRAM;  Surgeon: Clent Demark, MD;  Location: [facility] CATH LAB;  Service: Cardiovascular;  Laterality: N/A;  . PERCUTANEOUS CORONARY STENT INTERVENTION (PCI-S) N/A 10/02/2011   Procedure: PERCUTANEOUS CORONARY STENT INTERVENTION (PCI-S);   Surgeon: Clent Demark, MD;  Location[facility] CATH LAB;  Service: Cardiovascular;  Laterality: N/A;  . TRACHEOSTOMY CLOSURE         Family History  Family history unknown: Yes    Social History   Tobacco Use  . Smoking status: Former Smoker    Packs/day: 1.00    Years: 40.00    Pack years: 40.00    Types: Cigarettes  . Smokeless tobacco: Never Used  Vaping Use  . Vaping Use: Never used  Substance Use Topics  . Alcohol use: Not Currently  . Drug use: Never    Home Medications Prior to Admission medications   Medication Sig Start Date End Date Taking? Authorizing Provider  aspirin EC 81 MG EC tablet Take 1 tablet (81 mg total) by mouth daily. 03/08/13  Yes Charolette Forward, MD  atorvastatin (LIPITOR) 80 MG tablet Take 80 mg by mouth at bedtime.   Yes [provider]  clopidogrel (PLAVIX) 75 MG tablet Take 75 mg by mouth daily.   Yes [provider]  fluconazole (DIFLUCAN) 40 MG/ML suspension Take 5 mL today, then, 2.2mL daily for 20 more days. 09/25/19  Yes Eppie Gibson, MD  insulin detemir (LEVEMIR) 100 UNIT/ML FlexPen Inject 12 Units into the skin daily. May substitute for Lantus FlexPen if Levemir is not available Patient taking differently: Inject 20 Units into the skin daily. May substitute for Lantus FlexPen if Levemir is not available 08/17/19  Yes Rai, Ripudeep K, MD  lidocaine (XYLOCAINE) 2 % solution Patient: Mix 1part 2% viscous lidocaine, 1part H20. Swish & swallow 23mL of diluted mixture, 38min before meals and at bedtime, up to QID Patient taking differently: Use as directed 15 mLs in the mouth or throat every 6 (six) hours as needed for mouth pain. Patient: Mix 1part 2% viscous lidocaine, 1part H20. Swish & swallow 51mL of diluted mixture, 8min before meals and at bedtime, up to QID 09/04/19  Yes Eppie Gibson, MD  magnesium oxide (MAG-OX) 400 MG tablet Take 400 mg by mouth 2 (two) times daily.   Yes [provider]  metoprolol tartrate  (LOPRESSOR) 50 MG tablet Take 50 mg by mouth in the morning and at bedtime. 08/04/19  Yes [provider]  phenytoin (DILANTIN) 125 MG/5ML suspension Take 4 mLs (100 mg total) by mouth 3 (three) times daily. 08/30/19  Yes Tanner, Lyndon Code., PA-C  valproic acid (DEPAKENE) 250 MG/5ML solution Take 10 mLs (500 mg total) by mouth in the morning and at bedtime. 08/30/19  Yes Tanner, Lyndon Code., PA-C  cyclobenzaprine (FLEXERIL) 10 MG tablet Take 10 mg by mouth 3 (three) times daily as needed for muscle spasms. Patient not taking: Reported on 09/06/2019    [provider]  glipiZIDE (GLUCOTROL) 5 MG tablet Take 5 mg by mouth 2 (two) times daily before a meal. Patient not taking: Reported on 09/29/2019 03/24/19   [provider]  insulin isophane & regular human (HUMULIN 70/30 MIX) (70-30) 100 UNIT/ML KwikPen Dose based on SSI coverage chart provided to patient by clinic 09/27/19   Harle Stanford., PA-C  Insulin Pen Needle 32G X 4 MM  MISC 12 Units by Does not apply route daily. 08/17/19   Rai, Vernelle Emerald, MD  scopolamine (TRANSDERM-SCOP) 1 MG/3DAYS Place 1 patch (1.5 mg total) onto the skin every 3 (three) days. Patient not taking: Reported on 09/29/2019 09/19/19   Eppie Gibson, MD  sodium fluoride (PREVIDENT 5000 PLUS) 1.1 % CREA dental cream Apply thin ribbon of cream to tooth brush. Brush teeth for 2 minutes. Spit out excess-DO NOT swallow. DO NOT rinse afterwards. Repeat nightly. Patient not taking: Reported on 09/06/2019 08/11/19   Lenn Cal, DDS  Water For Irrigation, Sterile (FREE WATER) SOLN Place 120 mLs into feeding tube 5 (five) times daily. 08/17/19   Rai, Vernelle Emerald, MD    Allergies    Morphine and related  Review of Systems   Review of Systems  All other systems reviewed and are negative.   Physical Exam Updated Vital Signs BP 125/76   Pulse (!) 117   Temp 98.7 F (37.1 C)   Resp 16   SpO2 99%   Physical Exam Vitals and nursing note reviewed.  Constitutional:       General: He is not in acute distress.    Appearance: He is well-developed.     Comments: Thin appearing male  HENT:     Head: Normocephalic and atraumatic.  Eyes:     Conjunctiva/sclera: Conjunctivae normal.  Cardiovascular:     Rate and Rhythm: Regular rhythm. Tachycardia present.  Pulmonary:     Effort: Pulmonary effort is normal. No respiratory distress.     Breath sounds: Normal breath sounds.  Abdominal:     General: Bowel sounds are normal.     Palpations: Abdomen is soft.     Tenderness: There is no abdominal tenderness.  Skin:    General: Skin is warm.  Neurological:     Mental Status: He is alert.     Comments: Dysphasia (chronic)   Psychiatric:        Behavior: Behavior normal.     ED Results / Procedures / Treatments   Labs (all labs ordered are listed, but only abnormal results are displayed) Labs Reviewed  BASIC METABOLIC PANEL - Abnormal; Notable for the following components:      Result Value   Glucose, Bld 339 (*)    BUN 23 (*)    All other components within normal limits  CBC - Abnormal; Notable for the following components:   RBC 4.08 (*)    Hemoglobin 11.5 (*)    HCT 35.9 (*)    All other components within normal limits  URINALYSIS, ROUTINE W REFLEX MICROSCOPIC - Abnormal; Notable for the following components:   Specific Gravity, Urine 1.038 (*)    Glucose, UA >=500 (*)    Ketones, ur 5 (*)    All other components within normal limits  CBG MONITORING, ED - Abnormal; Notable for the following components:   Glucose-Capillary 382 (*)    All other components within normal limits  CBG MONITORING, ED - Abnormal; Notable for the following components:   Glucose-Capillary 257 (*)    All other components within normal limits  CBG MONITORING, ED    EKG None  Radiology No results found.  Procedures Procedures (including critical care time)  Medications Ordered in ED Medications  sodium chloride 0.9 % bolus 1,000 mL (0 mLs Intravenous Stopped  09/29/19 2035)    ED Course  I have reviewed the triage vital signs and the nursing notes.  Pertinent labs & imaging results that were available during  my care of the patient were reviewed by me and considered in my medical decision making (see chart for details).    MDM Rules/Calculators/A&P                          Patient presenting from PCP with hyperglycemia.  He is insulin-dependent type 2 diabetic, currently being treated for mouth cancer, recently completed radiation therapy on Wednesday.  He states he has had good intake through his feeding tube and thinks he has been staying hydrated.  His PCP prescribed him new sliding scale insulin to add to his regimen today for better control of his diabetes.  He states overall he feels well, he is not short of breath, he is not complaining of nausea, vomiting, abdominal pain.  He is in no distress on evaluation.  His blood glucose is 339 though he is not in DKA.  No other electrolyte abnormalities.  No leukocytosis.  Hemoglobin appears stable.  He is however tachycardic, therefore IV fluids were administered.  On reevaluation his heart rate is improving.  Had shared decision making with patient regarding tachycardia.  He is not exhibiting any symptoms of PE.  He is asymptomatic and feels comfortable with continuing to hydrate at home, monitoring blood sugar.  He is instructed of strict return precautions and close outpatient follow-up.  Patient discussed with Dr. Ron Parker.   Final Clinical Impression(s) / ED Diagnoses Final diagnoses:  Type 2 diabetes mellitus with hyperglycemia, with long-term current use of insulin [facility])    Rx / DC Orders ED Discharge Orders    None       Charnice Zwilling, [place] N, PA-C 09/29/19 2316    Breck Coons, MD 09/30/19 1019

## 2019-09-29 NOTE — Discharge Instructions (Addendum)
Continue to take your insulin as directed.  It is important that you stay hydrated, drink plenty of water.  Monitor your blood sugar level. Please follow-up with your primary care. Please return to the emergency department if you develop vomiting, abdominal pain, shortness of breath, or persistently high blood sugars.

## 2019-10-02 ENCOUNTER — Ambulatory Visit: Payer: No Typology Code available for payment source

## 2019-10-02 ENCOUNTER — Other Ambulatory Visit: Payer: Self-pay

## 2019-10-02 DIAGNOSIS — R4781 Slurred speech: Secondary | ICD-10-CM | POA: Diagnosis not present

## 2019-10-02 DIAGNOSIS — C040 Malignant neoplasm of anterior floor of mouth: Secondary | ICD-10-CM | POA: Diagnosis not present

## 2019-10-02 DIAGNOSIS — R1312 Dysphagia, oropharyngeal phase: Secondary | ICD-10-CM | POA: Diagnosis not present

## 2019-10-02 DIAGNOSIS — I890 Lymphedema, not elsewhere classified: Secondary | ICD-10-CM | POA: Diagnosis not present

## 2019-10-02 DIAGNOSIS — R293 Abnormal posture: Secondary | ICD-10-CM | POA: Diagnosis not present

## 2019-10-02 DIAGNOSIS — M6281 Muscle weakness (generalized): Secondary | ICD-10-CM | POA: Diagnosis not present

## 2019-10-02 DIAGNOSIS — M25612 Stiffness of left shoulder, not elsewhere classified: Secondary | ICD-10-CM | POA: Diagnosis not present

## 2019-10-02 NOTE — Therapy (Signed)
[facility] [address] [place], [place], [address] Phone: [phone]   Fax:  [phone]  Speech Language Pathology Treatment  Patient Details  Name: [name] MRN: [id number] Date of Birth: 1960-12-22 Referring Provider (SLP): Eppie Gibson, MD   Encounter Date: 10/02/2019   End of Session - 10/02/19 1516    Visit Number 2    Number of Visits 7    Date for SLP Re-Evaluation 11/29/19    SLP Start Time 1106    SLP Stop Time  1148    SLP Time Calculation (min) 42 min    Activity Tolerance Patient tolerated treatment well           Past Medical History:  Diagnosis Date  . Anxiety   . Cancer ([place])    mouth  . Chronic pancreatitis ([place])   . Coronary artery disease   . Depression   . Diabetes mellitus without complication ([place])   . Hyperlipidemia   . Hypertension   . Myocardial infarction ([place])   . Neuromuscular disorder ([place])   . Sciatica   . Seizures ([place])   . Stroke ([place])   . Substance abuse [facility])     Past Surgical History:  Procedure Laterality Date  . BACK SURGERY    . CORONARY STENT PLACEMENT    . GASTROSTOMY TUBE PLACEMENT Left 07/31/2019   [facility]  . LEFT HEART CATHETERIZATION WITH CORONARY ANGIOGRAM N/A 10/01/2011   Procedure: LEFT HEART CATHETERIZATION WITH CORONARY ANGIOGRAM;  Surgeon: Clent Demark, MD;  Location: [facility] CATH LAB;  Service: Cardiovascular;  Laterality: N/A;  . LEFT HEART CATHETERIZATION WITH CORONARY ANGIOGRAM N/A 03/06/2013   Procedure: LEFT HEART CATHETERIZATION WITH CORONARY ANGIOGRAM;  Surgeon: Clent Demark, MD;  Location: [facility] CATH LAB;  Service: Cardiovascular;  Laterality: N/A;  . PERCUTANEOUS CORONARY STENT INTERVENTION (PCI-S) N/A 10/02/2011   Procedure: PERCUTANEOUS CORONARY STENT INTERVENTION (PCI-S);  Surgeon: Clent Demark, MD;  Location: [facility] CATH LAB;  Service: Cardiovascular;  Laterality: N/A;  . TRACHEOSTOMY CLOSURE      There were no vitals filed  for this visit.   Subjective Assessment - 10/02/19 1305    Subjective Pt's speech intelligibilty remains decr'd for this SLP.    Patient is accompained by: Family member   wife   Currently in Pain? No/denies    Pain Onset More than a month ago                 ADULT SLP TREATMENT - 10/02/19 1306      General Information   Behavior/Cognition Alert;Cooperative      Treatment Provided   Treatment provided Dysphagia      Dysphagia Treatment   Temperature Spikes Noted No    Respiratory Status Room air    Oral Cavity - Dentition Missing dentition    Treatment Methods Skilled observation;Compensation strategy training;Patient/caregiver education    Other treatment/comments Enters today with what sounds like secretions in nasopharyngeal cavity. SLP asked pt to swallow x4 with mild-mod improvement in vocal quality, average trigger time 7.5 seconds. Pt indicates he has not had PO x3/day as SLP recommended in previous session, Kelechi states he expectorates instead of attempting to swallow his secretions. He drinks "a couple sips" of water, rarely in the last 4 weeks. Last week, pt reports he attempted water via a bottle sip and coughed (SLP recommended sips via teaspoon in previous session as well as a small amount of POs x3/day to inhibit/limit disuse atrophy). With small sip  from a cup, half teaspoon sips x3 via teaspoon, pt demonstrated consistent difficulty with what sounded like posterior oral residue. On one 1/2 teaspoon bolus via teaspoon pt with violent cough, immediately. With three plugged-straw boluses H2O, pt had what appeared to be the same degree of oral/pharyngeal residues as with 1/2 teaspoon teaspoon boluses. HEP not reviewed due to time; pt admits he has not performed HEP (effortful swallow, Pitch raise, Mendelsohn). SLP reminded pt about disuse atrophy and told pt to attempt to swallow saliva once each hour x4-5 attempts with vocalization between each rep to assess for success in  clearing secretions- if pt unsuccesful, he is supposed to "hock" and expectorate secretions. Pt also strongly encoraged to begin to perform swallowing HEP.       Assessment / Recommendations / Plan   Plan Continue with current plan of care      Progression Toward Goals   Progression toward goals Not progressing toward goals (comment)            SLP Education - 10/02/19 1515    Education Details muscle disuse atrophy and it's effects, need to have POs, swallow 4-5 times each hour (see pt instructions)    Person(s) Educated Patient;Spouse    Methods Explanation;Demonstration;Verbal cues    Comprehension Verbalized understanding;Returned demonstration;Verbal cues required            SLP Short Term Goals - 10/02/19 1522      SLP SHORT TERM GOAL #1   Title pt will complete HEP with occasional min A    Time 1    Period --   sessions, for all STGs   Status On-going      SLP SHORT TERM GOAL #2   Title pt will follow swallow precautions with POs with rare min A    Time 1    Status On-going      SLP SHORT TERM GOAL #3   Title pt will tell SLP why oral hygiene is important    Time 1    Status Deferred      SLP SHORT TERM GOAL #4   Title pt will tell SLP why he is doing exercises, and why it is important to have daily PO in two sessions, with modified independence    Time 1    Status On-going            SLP Long Term Goals - 10/02/19 1523      SLP LONG TERM GOAL #1   Title pt will tell SLP why he is doing exercises, and why it is important to have daily PO in four sessions, with modified independence    Time 4    Period --   sessions, for all LTGs   Status On-going      SLP LONG TERM GOAL #2   Title pt will complete HEP with modifeid independence in 2 sessions    Time 3    Status On-going      SLP LONG TERM GOAL #3   Title pt will tell SLP 3 overt s/sx aspiration PNA with modified independence over 2 sessions    Time 4    Status On-going      SLP LONG TERM GOAL  #4   Title pt will tell SLP when to reduce frequency of HEP    Time 5    Status On-going            Plan - 10/02/19 1517    Clinical Impression Statement Pt remains PEG  dependent since Jul 31, 2019. Despite SLP directions to have something PO x3/day, pt endorsed today extremely rare PO. See "other comments" for details. Pt having difficulty today clearing secretions with one episode of immediate cough with 1/2 teaspoon bolus H2O presented via teaspoon and minor head tilt back to foster oral transit of water bolus. Data indicate that pt's swallow ability will decr over the course of radiation therapy and could very well decline over time following conclusion of their radiation therapy due to muscle disuse atrophy and/or muscle fibrosis. Pt will cont to need to be seen by SLP in order to assess safety of PO intake, assess the need for recommending any objective swallow assessment, and ensuring pt correctly completes the individualized HEP.    Speech Therapy Frequency --   once approx every 4 weeks   Duration --   6 visits; 7 total sessions   Treatment/Interventions Aspiration precaution training;Pharyngeal strengthening exercises;Diet toleration management by SLP;Trials of upgraded texture/liquids;Internal/external aids;Patient/family education;SLP instruction and feedback    Potential to Achieve Goals Fair    Potential Considerations Severity of impairments    SLP Home Exercise Plan provided today    Consulted and Agree with Plan of Care Patient           Patient will benefit from skilled therapeutic intervention in order to improve the following deficits and impairments:   Dysphagia, oropharyngeal phase  Slurred speech    Problem List Patient Active Problem List   Diagnosis Date Noted  . Diabetes mellitus due to underlying condition with diabetic nephropathy, with long-term current use of insulin ([place]) 09/27/2019  . Malnutrition of moderate degree 08/16/2019  . Hyperkalemia  08/15/2019  . Hyperglycemia 08/15/2019  . SIRS (systemic inflammatory response syndrome) ([place]) 08/15/2019  . AKI (acute kidney injury) ([place]) 08/15/2019  . Oral candidiasis 08/15/2019  . Hypercalcemia 08/15/2019  . 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 Schizophrenia spectrum disorder with psychotic disorder type not yet determined ([place]) 09/24/2015  . Intermittent explosive disorder 09/20/2015  . Severe recurrent major depressive disorder with psychotic features ([place]) 09/20/2015  . Suicidal ideation 09/20/2015  . Homicidal ideation 09/20/2015  . Chest pain at rest 03/19/2013  . Acute non Q wave MI (myocardial infarction), initial episode of care ([place]) 03/06/2013  . Chronic sciatica of left side 09/10/2012  . Personality disorder ([place]) 11/10/2011  . Alcohol use disorder, mild, abuse 10/01/2011  . NSTEMI (non-ST elevated myocardial infarction) ([place]) 10/01/2011  . Erectile dysfunction 08/01/2010  . Urge incontinence of urine 08/01/2010  . Chronic alcoholic pancreatitis ([place]) 07/24/2010  . Tobacco use disorder 07/24/2010  . Hypertension 07/24/2010  . Hyperlipidemia 07/24/2010  . CVA (cerebral vascular accident) ([place]) 07/24/2010  . Chronic back pain 07/24/2010    [facility] 10/02/2019, 3:24 PM  [facility] [address] [place], [place], [address] Phone: [phone]   Fax:  [phone]   Name: Garret Teale MRN: [id number] Date of Birth: 09-May-1960

## 2019-10-02 NOTE — Patient Instructions (Signed)
   Every hour, swallow HARD 4-5 times to clear the saliva from your mouth. If you still have it in your throat or mouth, "hock" like you did today and spit it out.

## 2019-10-03 ENCOUNTER — Telehealth: Payer: Self-pay | Admitting: Nutrition

## 2019-10-03 ENCOUNTER — Encounter: Payer: Medicare PPO | Admitting: Nutrition

## 2019-10-03 ENCOUNTER — Ambulatory Visit: Payer: No Typology Code available for payment source

## 2019-10-03 ENCOUNTER — Inpatient Hospital Stay: Payer: Medicare PPO | Admitting: Nutrition

## 2019-10-03 NOTE — Progress Notes (Signed)
See telephone note.

## 2019-10-03 NOTE — Telephone Encounter (Signed)
Nutrition follow-up completed with patient and wife over the telephone.  Patient is status post radiation therapy for cancer of the oral cavity/floor of mouth. Weight decreased and documented as 127 pounds on July 14. Patient's wife reports patient is doing well with tube feeding.  He is tolerating [employer] 1.4, 1 carton 5 times daily with 60 mL of free water before and after each bolus feeding.  He is also tolerating Prosource, 30 mL twice a day via G-tube. Tube feeding and Prosource provides 2475 cal, 130 g protein, and 275 g carbohydrate.  Estimated nutrition needs: 2400-2600 cal, 120-135 g protein, 2.5 L fluid.  Nutrition diagnosis: Moderate malnutrition continues.  Intervention: Educated patient to begin water per speech therapy instructions.  Enforced importance of doing swallowing exercises so patient begin can begin to eat safely. Continue present tube feeding with added protein.  Encourage patient to attempt to get one extra carton of [employer] a day to provide additional calories and protein. Once patient able to start eating soft foods and drinking liquids patient may be able to gain weight.  Monitoring, evaluation, goals: Patient will continue tube feedings and free water flushes to minimize further weight loss.  Next visit: Wednesday, July 28 after MD visit.  **Disclaimer: This note was dictated with voice recognition software. Similar sounding words can inadvertently be transcribed and this note may contain transcription errors which may not have been corrected upon publication of note.**

## 2019-10-04 ENCOUNTER — Ambulatory Visit: Payer: No Typology Code available for payment source

## 2019-10-04 ENCOUNTER — Ambulatory Visit: Payer: No Typology Code available for payment source | Admitting: Physical Therapy

## 2019-10-04 ENCOUNTER — Other Ambulatory Visit: Payer: Self-pay

## 2019-10-04 ENCOUNTER — Encounter: Payer: Self-pay | Admitting: Physical Therapy

## 2019-10-04 DIAGNOSIS — R293 Abnormal posture: Secondary | ICD-10-CM | POA: Diagnosis not present

## 2019-10-04 DIAGNOSIS — I890 Lymphedema, not elsewhere classified: Secondary | ICD-10-CM | POA: Diagnosis not present

## 2019-10-04 DIAGNOSIS — M25612 Stiffness of left shoulder, not elsewhere classified: Secondary | ICD-10-CM

## 2019-10-04 DIAGNOSIS — M6281 Muscle weakness (generalized): Secondary | ICD-10-CM | POA: Diagnosis not present

## 2019-10-04 DIAGNOSIS — C040 Malignant neoplasm of anterior floor of mouth: Secondary | ICD-10-CM

## 2019-10-04 DIAGNOSIS — R1312 Dysphagia, oropharyngeal phase: Secondary | ICD-10-CM | POA: Diagnosis not present

## 2019-10-04 DIAGNOSIS — R4781 Slurred speech: Secondary | ICD-10-CM | POA: Diagnosis not present

## 2019-10-04 NOTE — Therapy (Signed)
[place], [place], [address] Phone: [phone]   Fax:  [phone]  Physical Therapy Treatment  Patient Details  Name: [name] MRN: [id number] Date of Birth: 10-07-1960 Referring Provider (PT): Reita May Date: 10/04/2019   PT End of Session - 10/04/19 1626    Visit Number 2    Number of Visits 10    Date for PT Re-Evaluation 11/06/19    PT Start Time 1503    PT Stop Time 1600    PT Time Calculation (min) 57 min    Activity Tolerance Patient tolerated treatment well    Behavior During Therapy [facility] for tasks assessed/performed           Past Medical History:  Diagnosis Date  . Anxiety   . Cancer ([place])    mouth  . Chronic pancreatitis ([place])   . Coronary artery disease   . Depression   . Diabetes mellitus without complication ([place])   . Hyperlipidemia   . Hypertension   . Myocardial infarction ([place])   . Neuromuscular disorder ([place])   . Sciatica   . Seizures ([place])   . Stroke ([place])   . Substance abuse [facility])     Past Surgical History:  Procedure Laterality Date  . BACK SURGERY    . CORONARY STENT PLACEMENT    . GASTROSTOMY TUBE PLACEMENT Left 07/31/2019   [facility]  . LEFT HEART CATHETERIZATION WITH CORONARY ANGIOGRAM N/A 10/01/2011   Procedure: LEFT HEART CATHETERIZATION WITH CORONARY ANGIOGRAM;  Surgeon: Clent Demark, MD;  Location: [facility] CATH LAB;  Service: Cardiovascular;  Laterality: N/A;  . LEFT HEART CATHETERIZATION WITH CORONARY ANGIOGRAM N/A 03/06/2013   Procedure: LEFT HEART CATHETERIZATION WITH CORONARY ANGIOGRAM;  Surgeon: Clent Demark, MD;  Location: [place] CATH LAB;  Service: Cardiovascular;  Laterality: N/A;  . PERCUTANEOUS CORONARY STENT INTERVENTION (PCI-S) N/A 10/02/2011   Procedure: PERCUTANEOUS CORONARY STENT INTERVENTION (PCI-S);  Surgeon: Clent Demark, MD;  Location: [facility] CATH LAB;  Service: Cardiovascular;  Laterality: N/A;  . TRACHEOSTOMY CLOSURE       There were no vitals filed for this visit.   Subjective Assessment - 10/04/19 1507    Subjective I am having swelling in my neck. It started about two-three weeks before ending radiation.    Patient Stated Goals to gain all info from head and neck providers    Currently in Pain? No/denies    Pain Score 0-No pain              [facility] PT Assessment - 10/04/19 0001      ROM / Strength   AROM / PROM / Strength Strength;AROM      AROM   AROM Assessment Site Shoulder    Right/Left Shoulder Right;Left    Right Shoulder Flexion 150 Degrees    Right Shoulder ABduction 159 Degrees    Right Shoulder Internal Rotation 61 Degrees    Right Shoulder External Rotation 73 Degrees    Left Shoulder Flexion 120 Degrees    Left Shoulder ABduction 74 Degrees   sharp pain    Left Shoulder Internal Rotation 40 Degrees    Left Shoulder External Rotation 57 Degrees      Strength   Strength Assessment Site Hip;Knee;Ankle    Right/Left Hip Right;Left    Right Hip Flexion 3/5    Right Hip Extension 2+/5    Right Hip ABduction 4/5    Left Hip Flexion 3+/5    Left Hip Extension  2+/5    Left Hip ABduction 4/5    Right/Left Knee Right;Left    Right Knee Flexion 4/5    Right Knee Extension 4/5    Left Knee Flexion 4/5    Left Knee Extension 4/5    Right/Left Ankle Right;Left    Right Ankle Dorsiflexion 5/5    Left Ankle Dorsiflexion 5/5             LYMPHEDEMA/ONCOLOGY QUESTIONNAIRE - 10/04/19 0001      Head and Neck   4 cm superior to sternal notch around neck 38.6 cm    6 cm superior to sternal notch around neck 37.1 cm    8 cm superior to sternal notch around neck 37.5 cm                      [facility] Adult PT Treatment/Exercise - 10/04/19 0001      Knee/Hip Exercises: Standing   Hip Flexion AROM;Both;1 set;10 reps;Knee bent   pt returned therapist demo, used counter for support   Hip ADduction AROM;Both;1 set;10 reps   pt returned therapist demo, v/c to keep knee  straight   Hip Extension AROM;Both;1 set;10 reps;Knee straight   holding on to counter, pt returned therapist demo     Knee/Hip Exercises: [place] Strengthening;Both;1 set;10 reps;Weights   pt returned therapist demo, 3 sec hold   Long Arc Quad Weight 2 lbs.    Marching Strengthening;Both;1 set;10 reps;Weights   verbal cues to slowly raise hip and slowly lower   Marching Weights 2 lbs.      Shoulder Exercises: Supine   Flexion AROM;10 reps;Left   began with AAROM then progressed to AROM   ABduction AROM;Left;10 reps   pt had pain in direction of abduction so did scaption                      PT Long Term Goals - 10/04/19 1628      PT LONG TERM GOAL #1   Title Pt will be independent in self MLD for long term management of lymphedema.    Time 4    Period Weeks    Status New      PT LONG TERM GOAL #2   Title Pt will receive appropriate compression garments for long term management of lymphedema    Time 4    Period Weeks    Status New      PT LONG TERM GOAL #3   Title Pt will be independent in a home exercise program for continued strengthening and stretching.    Time 4    Period Weeks    Status New      PT LONG TERM GOAL #4   Title Pt will demonstrate 3+/5 bilateral hip extensor strength to decrease fall risk    Baseline R and L 2+/5    Time 4    Period Weeks    Status New    Target Date 11/06/19      PT LONG TERM GOAL #5   Title Pt will demosntate 130 degrees of left shoulder abduction to allow him to reach out to the sides    Baseline 74 with sharp pain    Time 4    Period Weeks    Status New    Target Date 11/06/19                 Plan - 10/04/19 1609    Clinical  Impression Statement Pt presents to PT today after completing radiation. Reassessed neck swelling today and it is about 1 cm larger than evaluation. Pt is also still demonstrating chin swelling. Assessed LE strength today and pt exhibits weakness of bilateral hip  extensors and hip abductors. He ambulates with a straight cane but is very stiff with minimal trunk rotation, hip flexion and foot clearance. Measured left shoulder ROM which is 30 degrees less in direction of flexion and nearly 100 degrees less in direction of abduction. Pt has pain with R shoulder abduction. Pt would benefit from skilled PT services to increase bilateral LE strength, improve L shoulder strength and ROM and instruct pt in MLD for head and neck so he can manage independently.    Rehab Potential Good    PT Frequency 2x / week    PT Duration 4 weeks    PT Treatment/Interventions ADLs/Self Care Home Management;Therapeutic exercise;Manual techniques;Manual lymph drainage;Compression bandaging;Taping;Passive range of motion;Scar mobilization;Vasopneumatic Device;Therapeutic activities;Neuromuscular re-education    PT Next Visit Plan begin MLD for head and neck, scar massage, see about pump coverage, add LE strengthening if pt agreeable, work on compression garment    PT Home Exercise Plan head and neck ROM exercises,Access Code: [email or web address]    Consulted and Agree with Plan of Care Patient           Patient will benefit from skilled therapeutic intervention in order to improve the following deficits and impairments:  Postural dysfunction, Impaired UE functional use, Decreased range of motion, Decreased knowledge of precautions, Increased edema, Increased fascial restricitons, Decreased strength  Visit Diagnosis: Lymphedema, not elsewhere classified  Stiffness of left shoulder, not elsewhere classified  Muscle weakness (generalized)  Abnormal posture  Carcinoma of anterior part of floor of mouth ([place])     Problem List Patient Active Problem List   Diagnosis Date Noted  . Diabetes mellitus due to underlying condition with diabetic nephropathy, with long-term current use of insulin ([place]) 09/27/2019  . Malnutrition of moderate degree 08/16/2019  . Hyperkalemia 08/15/2019  .  Hyperglycemia 08/15/2019  . SIRS (systemic inflammatory response syndrome) ([place]) 08/15/2019  . AKI (acute kidney injury) ([place]) 08/15/2019  . Oral candidiasis 08/15/2019  . Hypercalcemia 08/15/2019  . 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 Schizophrenia spectrum disorder with psychotic disorder type not yet determined ([place]) 09/24/2015  . Intermittent explosive disorder 09/20/2015  . Severe recurrent major depressive disorder with psychotic features ([place]) 09/20/2015  . Suicidal ideation 09/20/2015  . Homicidal ideation 09/20/2015  . Chest pain at rest 03/19/2013  . Acute non Q wave MI (myocardial infarction), initial episode of care ([place]) 03/06/2013  . Chronic sciatica of left side 09/10/2012  . Personality disorder ([place]) 11/10/2011  . Alcohol use disorder, mild, abuse 10/01/2011  . NSTEMI (non-ST elevated myocardial infarction) ([place]) 10/01/2011  . Erectile dysfunction 08/01/2010  . Urge incontinence of urine 08/01/2010  . Chronic alcoholic pancreatitis ([place]) 07/24/2010  . Tobacco use disorder 07/24/2010  . Hypertension 07/24/2010  . Hyperlipidemia 07/24/2010  . CVA (cerebral vascular accident) ([place]) 07/24/2010  . Chronic back pain 07/24/2010    Allyson Sabal [facility] 10/04/2019, 4:30 PM  [place], [place], [address] Phone: [phone]   Fax:  [phone]  Name: [name] MRN: [id number] Date of Birth: 1961-01-16  Manus Gunning, PT 10/04/19 4:30 PM

## 2019-10-04 NOTE — Patient Instructions (Signed)
Access Code: [email or web address] URL: [email or web address] Date: 10/04/2019 Prepared by: Manus Gunning  Exercises Seated Long Arc Quad with Ankle Weight - 1 x daily - 7 x weekly - 2 sets - 10 reps - 3 second hold Seated Hip Flexion March with Ankle Weights - 1 x daily - 7 x weekly - 2 sets - 10 reps Standing Hip Extension with Counter Support - 1 x daily - 7 x weekly - 2 sets - 10 reps Standing Hip Abduction with Counter Support - 1 x daily - 7 x weekly - 2 sets - 10 reps Standing Hip Flexion March - 1 x daily - 7 x weekly - 2 sets - 10 reps Supine Shoulder Flexion Extension Full Range AROM - 1 x daily - 7 x weekly - 1 sets - 10 reps Supine Shoulder Abduction AROM - 1 x daily - 7 x weekly - 3 sets - 10 reps

## 2019-10-05 ENCOUNTER — Ambulatory Visit: Payer: No Typology Code available for payment source

## 2019-10-06 ENCOUNTER — Ambulatory Visit: Payer: No Typology Code available for payment source

## 2019-10-09 ENCOUNTER — Ambulatory Visit: Payer: No Typology Code available for payment source

## 2019-10-09 ENCOUNTER — Encounter: Payer: Medicare PPO | Admitting: Nutrition

## 2019-10-09 ENCOUNTER — Telehealth: Payer: Self-pay

## 2019-10-09 ENCOUNTER — Ambulatory Visit: Payer: No Typology Code available for payment source | Admitting: Physical Therapy

## 2019-10-09 ENCOUNTER — Other Ambulatory Visit: Payer: Self-pay

## 2019-10-09 ENCOUNTER — Encounter: Payer: Self-pay | Admitting: Physical Therapy

## 2019-10-09 DIAGNOSIS — M6281 Muscle weakness (generalized): Secondary | ICD-10-CM

## 2019-10-09 DIAGNOSIS — M25612 Stiffness of left shoulder, not elsewhere classified: Secondary | ICD-10-CM

## 2019-10-09 DIAGNOSIS — R1312 Dysphagia, oropharyngeal phase: Secondary | ICD-10-CM | POA: Diagnosis not present

## 2019-10-09 DIAGNOSIS — R293 Abnormal posture: Secondary | ICD-10-CM

## 2019-10-09 DIAGNOSIS — I890 Lymphedema, not elsewhere classified: Secondary | ICD-10-CM | POA: Diagnosis not present

## 2019-10-09 DIAGNOSIS — R4781 Slurred speech: Secondary | ICD-10-CM | POA: Diagnosis not present

## 2019-10-09 DIAGNOSIS — C040 Malignant neoplasm of anterior floor of mouth: Secondary | ICD-10-CM | POA: Diagnosis not present

## 2019-10-09 NOTE — Patient Instructions (Signed)
Access Code: [email or web address] URL: [email or web address] Date: 10/09/2019 Prepared by: Manus Gunning  Exercises Seated Long Arc Quad with Ankle Weight - 1 x daily - 7 x weekly - 2 sets - 10 reps - 3 second hold Seated Hip Flexion March with Ankle Weights - 1 x daily - 7 x weekly - 2 sets - 10 reps Standing Hip Extension with Counter Support - 1 x daily - 7 x weekly - 2 sets - 10 reps Standing Hip Abduction with Counter Support - 1 x daily - 7 x weekly - 2 sets - 10 reps Standing Hip Flexion March - 1 x daily - 7 x weekly - 2 sets - 10 reps Supine Shoulder Flexion Extension Full Range AROM - 1 x daily - 7 x weekly - 1 sets - 10 reps Supine Shoulder Abduction AROM - 1 x daily - 7 x weekly - 3 sets - 10 reps Isometric Shoulder External Rotation at Wall - 1-2 x daily - 7 x weekly - 1 sets - 5 reps - 5 hold Isometric Shoulder Abduction at Wall - 1 x daily - 7 x weekly - 1 sets - 5 reps - 5 sec hold Isometric Shoulder Flexion at Wall - 1 x daily - 7 x weekly - 1 sets - 5 reps - 5 sec hold Isometric Shoulder Extension at Wall - 1 x daily - 7 x weekly - 1 sets - 5 reps - 5 sec hold

## 2019-10-09 NOTE — Telephone Encounter (Signed)
Patient's wife left a VM to say that patient was completely out of his 70/30 insulin, and she wanted to know what patient could do to get something sooner than the mail-order refill.  [facility] and spoke [facility] a representative that said patient could pick up a partial prescription from the [place] to cover patient until mail order prescription arrived.   Returned wife's call and relayed above information. Wife verbalized understanding and agreement, and denied any other needs at this time

## 2019-10-09 NOTE — Therapy (Signed)
[place], [place], [address] Phone: [phone]   Fax:  [phone]  Physical Therapy Treatment  Patient Details  Name: [name] MRN: [id number] Date of Birth: 14-Apr-1960 Referring Provider (PT): Reita May Date: 10/09/2019   PT End of Session - 10/09/19 1200    Visit Number 3    Number of Visits 10    Date for PT Re-Evaluation 11/06/19    PT Start Time 1106    PT Stop Time 1148    PT Time Calculation (min) 42 min    Activity Tolerance Patient tolerated treatment well    Behavior During Therapy [facility] for tasks assessed/performed           Past Medical History:  Diagnosis Date  . Anxiety   . Cancer ([place])    mouth  . Chronic pancreatitis ([place])   . Coronary artery disease   . Depression   . Diabetes mellitus without complication ([place])   . Hyperlipidemia   . Hypertension   . Myocardial infarction ([place])   . Neuromuscular disorder ([place])   . Sciatica   . Seizures ([place])   . Stroke ([place])   . Substance abuse [facility])     Past Surgical History:  Procedure Laterality Date  . BACK SURGERY    . CORONARY STENT PLACEMENT    . GASTROSTOMY TUBE PLACEMENT Left 07/31/2019   [facility]  . LEFT HEART CATHETERIZATION WITH CORONARY ANGIOGRAM N/A 10/01/2011   Procedure: LEFT HEART CATHETERIZATION WITH CORONARY ANGIOGRAM;  Surgeon: Clent Demark, MD;  Location: [facility] CATH LAB;  Service: Cardiovascular;  Laterality: N/A;  . LEFT HEART CATHETERIZATION WITH CORONARY ANGIOGRAM N/A 03/06/2013   Procedure: LEFT HEART CATHETERIZATION WITH CORONARY ANGIOGRAM;  Surgeon: Clent Demark, MD;  Location: [facility] CATH LAB;  Service: Cardiovascular;  Laterality: N/A;  . PERCUTANEOUS CORONARY STENT INTERVENTION (PCI-S) N/A 10/02/2011   Procedure: PERCUTANEOUS CORONARY STENT INTERVENTION (PCI-S);  Surgeon: Clent Demark, MD;  Location[facility] C[facility] LAB;  Service: Cardiovascular;  Laterality: N/A;  . TRACHEOSTOMY CLOSURE       There were no vitals filed for this visit.   Subjective Assessment - 10/09/19 1109    Subjective I am not having any pain. I feel like my chin is more swollen. (Pt has significant peeling of skin on neck from radiation)    Patient Stated Goals to gain all info from head and neck providers    Currently in Pain? No/denies    Pain Score 0-No pain                             [facility] Adult PT Treatment/Exercise - 10/09/19 0001      Neuro Re-ed    Neuro Re-ed Details  on airex using counter for support: hip flexion, hip abduction, hip extension all x 10 reps each bilaterally with pt reporting increased muscle fatigue, required v/c to keep knee straight throughout and for upright posture      Knee/Hip Exercises: Standing   Hip Flexion Stengthening;Both;1 set;5 reps;Knee straight   with hand held assist at back of bike, yellow band   Hip ADduction Strengthening;Both;1 set;5 sets   at back of bike, yellow band   Hip Abduction Stengthening;Both;1 set;5 reps   at back of bike, yellow theraband   Hip Extension Stengthening;Both;1 set;5 reps   at bike of bike with yellow band     Shoulder Exercises: Seated   Retraction  Strengthening;Both;20 reps;Theraband    Theraband Level (Shoulder Retraction) Level 2 (Red)    Retraction Limitations v/c to keep muscles engaged throughout with upright posture to avoid popping in shoulder    Flexion AAROM;Left   x 2 reps but pt had pain, he had no pain with PROM   Abduction AAROM;Left   x 2 reps but pt report pain so stopped     Shoulder Exercises: Isometric Strengthening   Flexion 5X5"   pt returned therapist demo   Extension 5X5"   pt returned therapist demo   External Rotation 5X5"   pt returned therapist demo   ABduction 5X5"   pt returned therapist demo                      PT [place] - 10/04/19 1628      PT LONG TERM GOAL #1   Title Pt will be independent in self MLD for long term management of lymphedema.     Time 4    Period Weeks    Status New      PT LONG TERM GOAL #2   Title Pt will receive appropriate compression garments for long term management of lymphedema    Time 4    Period Weeks    Status New      PT LONG TERM GOAL #3   Title Pt will be independent in a home exercise program for continued strengthening and stretching.    Time 4    Period Weeks    Status New      PT LONG TERM GOAL #4   Title Pt will demonstrate 3+/5 bilateral hip extensor strength to decrease fall risk    Baseline R and L 2+/5    Time 4    Period Weeks    Status New    Target Date 11/06/19      PT LONG TERM GOAL #5   Title Pt will demosntate 130 degrees of left shoulder abduction to allow him to reach out to the sides    Baseline 74 with sharp pain    Time 4    Period Weeks    Status New    Target Date 11/06/19                 Plan - 10/09/19 1133    Clinical Impression Statement Pt returns to PT today with increased peeling of skin of neck secondary to radiation. Educated pt to not pick at skin or scratch area and keep it moisturized. Focused today on exercises including LE strengthening, high level balance and shoulder strengthening since skin is too fragile for MLD. Pt has increased muscle fatigue today with exercises and required a few recovery periods. Added shoulder isometric exercises to HEP.    PT Frequency 2x / week    PT Duration 4 weeks    PT Treatment/Interventions ADLs/Self Care Home Management;Therapeutic exercise;Manual techniques;Manual lymph drainage;Compression bandaging;Taping;Passive range of motion;Scar mobilization;Vasopneumatic Device;Therapeutic activities;Neuromuscular re-education    PT Next Visit Plan begin MLD for head and neck once neck skin has healed, scar massage, see about pump coverage, continue LE strengthening - hip strength, high level balance // bars, work on compression garment    PT Home Exercise Plan head and neck ROM exercises,Access Code: [email or web address]     Consulted and Agree with Plan of Care Patient           Patient will benefit from skilled therapeutic intervention in order to improve the following  deficits and impairments:  Postural dysfunction, Impaired UE functional use, Decreased range of motion, Decreased knowledge of precautions, Increased edema, Increased fascial restricitons, Decreased strength  Visit Diagnosis: Stiffness of left shoulder, not elsewhere classified  Muscle weakness (generalized)  Abnormal posture     Problem List Patient Active Problem List   Diagnosis Date Noted  . Diabetes mellitus due to underlying condition with diabetic nephropathy, with long-term current use of insulin ([place]) 09/27/2019  . Malnutrition of moderate degree 08/16/2019  . Hyperkalemia 08/15/2019  . Hyperglycemia 08/15/2019  . SIRS (systemic inflammatory response syndrome) ([place]) 08/15/2019  . AKI (acute kidney injury) ([place]) 08/15/2019  . Oral candidiasis 08/15/2019  . Hypercalcemia 08/15/2019  . 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 Schizophrenia spectrum disorder with psychotic disorder type not yet determined ([place]) 09/24/2015  . Intermittent explosive disorder 09/20/2015  . Severe recurrent major depressive disorder with psychotic features ([place]) 09/20/2015  . Suicidal ideation 09/20/2015  . Homicidal ideation 09/20/2015  . Chest pain at rest 03/19/2013  . Acute non Q wave MI (myocardial infarction), initial episode of care ([place]) 03/06/2013  . Chronic sciatica of left side 09/10/2012  . Personality disorder ([place]) 11/10/2011  . Alcohol use disorder, mild, abuse 10/01/2011  . NSTEMI (non-ST elevated myocardial infarction) ([place]) 10/01/2011  . Erectile dysfunction 08/01/2010  . Urge incontinence of urine 08/01/2010  . Chronic alcoholic pancreatitis ([place]) 07/24/2010  . Tobacco use disorder 07/24/2010  . Hypertension 07/24/2010  . Hyperlipidemia 07/24/2010  . CVA (cerebral vascular accident) ([place])  07/24/2010  . Chronic back pain 07/24/2010    Allyson Sabal [facility] 10/09/2019, 12:05 PM  [place], [place], [address] Phone: [phone]   Fax:  [phone]  Name: Obi Scrima MRN: [id number] Date of Birth: [birth year]  Manus Gunning, PT 10/09/19 12:06 PM

## 2019-10-10 ENCOUNTER — Ambulatory Visit: Payer: No Typology Code available for payment source

## 2019-10-11 ENCOUNTER — Ambulatory Visit
Admission: RE | Admit: 2019-10-11 | Discharge: 2019-10-11 | Disposition: A | Discharge status: Home / Self Care | Payer: Medicare PPO | Source: Ambulatory Visit | Attending: Radiation Oncology | Admitting: Radiation Oncology

## 2019-10-11 ENCOUNTER — Ambulatory Visit: Payer: No Typology Code available for payment source

## 2019-10-11 ENCOUNTER — Encounter: Payer: Medicare PPO | Admitting: Physical Therapy

## 2019-10-11 ENCOUNTER — Other Ambulatory Visit: Payer: Self-pay

## 2019-10-11 ENCOUNTER — Inpatient Hospital Stay: Payer: Medicare PPO | Admitting: Nutrition

## 2019-10-11 ENCOUNTER — Encounter: Payer: Medicare PPO | Admitting: Nutrition

## 2019-10-11 VITALS — BP 125/72 | HR 81 | Temp 99.4°F | Resp 18 | Ht 70.0 in | Wt 131.0 lb

## 2019-10-11 DIAGNOSIS — R05 Cough: Secondary | ICD-10-CM | POA: Diagnosis not present

## 2019-10-11 DIAGNOSIS — C040 Malignant neoplasm of anterior floor of mouth: Secondary | ICD-10-CM | POA: Diagnosis not present

## 2019-10-11 DIAGNOSIS — I2510 Atherosclerotic heart disease of native coronary artery without angina pectoris: Secondary | ICD-10-CM | POA: Diagnosis not present

## 2019-10-11 DIAGNOSIS — Z79899 Other long term (current) drug therapy: Secondary | ICD-10-CM | POA: Insufficient documentation

## 2019-10-11 DIAGNOSIS — Z7982 Long term (current) use of aspirin: Secondary | ICD-10-CM | POA: Insufficient documentation

## 2019-10-11 DIAGNOSIS — R1310 Dysphagia, unspecified: Secondary | ICD-10-CM | POA: Diagnosis not present

## 2019-10-11 DIAGNOSIS — I10 Essential (primary) hypertension: Secondary | ICD-10-CM | POA: Diagnosis not present

## 2019-10-11 DIAGNOSIS — R630 Anorexia: Secondary | ICD-10-CM | POA: Diagnosis not present

## 2019-10-11 DIAGNOSIS — E1165 Type 2 diabetes mellitus with hyperglycemia: Secondary | ICD-10-CM | POA: Diagnosis not present

## 2019-10-11 DIAGNOSIS — R471 Dysarthria and anarthria: Secondary | ICD-10-CM | POA: Diagnosis not present

## 2019-10-11 DIAGNOSIS — R269 Unspecified abnormalities of gait and mobility: Secondary | ICD-10-CM | POA: Diagnosis not present

## 2019-10-11 DIAGNOSIS — Z923 Personal history of irradiation: Secondary | ICD-10-CM | POA: Diagnosis not present

## 2019-10-11 NOTE — Progress Notes (Signed)
[name] presents today for 2 week follow-up after completing radiation to his floor of mouth on 09/27/2019  Pain issues, if any: Constant right ear pain (5 out of 10), and right shoulder pain from recent PT session Using a feeding tube?: Yes--still primary source of nutrition. Instills 1 carton of supplement 5x daily, as well a protein supplement 2x daily. Continues to be followed closely by dietician (phone visit on 07/20/201 with Bard Neff-RD) Weight changes, if any:  Wt Readings from Last 3 Encounters:  10/11/19 131 lb (59.4 kg)  09/27/19 127 lb (57.6 kg)  09/15/19 130 lb 6.4 oz (59.1 kg)   Swallowing issues, if any: Yes, last saw Bridgett Larsson on 10/02/2019: "Pt remains PEG dependent since Jul 31, 2019. Despite SLP directions to have something PO x3/day, pt endorsed today extremely rare PO. See "other comments" for details. Pt having difficulty today clearing secretions with one episode of immediate cough with 1/2 teaspoon bolus H2O presented via teaspoon and minor head tilt back to foster oral transit of water bolus." Smoking or chewing tobacco? None Using fluoride trays daily? N/A Last ENT visit was on: 08/10/2019 Saw Dr. Harriett Rush [place] who plans to see patient again 6 weeks after completing radiation.   Other notable issues, if any: Swelling under jaw has improved, but patient feels chin is more swollen. Denies any issues opening/closing mouth. Skin appears to be healing in treatment field.  Vitals:   10/11/19 1603  BP: 125/72  Pulse: 81  Resp: 18  Temp: 99.4 F (37.4 C)  SpO2: 100%

## 2019-10-12 ENCOUNTER — Encounter: Payer: Self-pay | Admitting: Rehabilitation

## 2019-10-12 ENCOUNTER — Other Ambulatory Visit: Payer: Self-pay

## 2019-10-12 ENCOUNTER — Ambulatory Visit: Payer: No Typology Code available for payment source | Admitting: Rehabilitation

## 2019-10-12 DIAGNOSIS — Z1329 Encounter for screening for other suspected endocrine disorder: Secondary | ICD-10-CM

## 2019-10-12 DIAGNOSIS — I890 Lymphedema, not elsewhere classified: Secondary | ICD-10-CM

## 2019-10-12 NOTE — Therapy (Signed)
[place], [place], [address] Phone: [phone]   Fax:  [phone]  Physical Therapy Treatment  Patient Details  Name: [name] MRN: [id number] Date of Birth: [birth year] Referring Provider (PT): Reita May Date: 10/12/2019   PT End of Session - 10/12/19 1423    Visit Number 3   no visit charge today 10/12/19   Number of Visits 10    Date for PT Re-Evaluation 11/06/19    PT Start Time 1105    PT Stop Time 1115    PT Time Calculation (min) 10 min           Past Medical History:  Diagnosis Date  . Anxiety   . Cancer ([place])    mouth  . Chronic pancreatitis ([place])   . Coronary artery disease   . Depression   . Diabetes mellitus without complication ([place])   . Hyperlipidemia   . Hypertension   . Myocardial infarction ([place])   . Neuromuscular disorder ([place])   . Sciatica   . Seizures ([place])   . Stroke ([place])   . Substance abuse [facility])     Past Surgical History:  Procedure Laterality Date  . BACK SURGERY    . CORONARY STENT PLACEMENT    . GASTROSTOMY TUBE PLACEMENT Left 07/31/2019   [facility]  . LEFT HEART CATHETERIZATION WITH CORONARY ANGIOGRAM N/A 10/01/2011   Procedure: LEFT HEART CATHETERIZATION WITH CORONARY ANGIOGRAM;  Surgeon: Clent Demark, MD;  Location: [facility] CATH LAB;  Service: Cardiovascular;  Laterality: N/A;  . LEFT HEART CATHETERIZATION WITH CORONARY ANGIOGRAM N/A 03/06/2013   Procedure: LEFT HEART CATHETERIZATION WITH CORONARY ANGIOGRAM;  Surgeon: Clent Demark, MD;  Location: [place] CATH LAB;  Service: Cardiovascular;  Laterality: N/A;  . PERCUTANEOUS CORONARY STENT INTERVENTION (PCI-S) N/A 10/02/2011   Procedure: PERCUTANEOUS CORONARY STENT INTERVENTION (PCI-S);  Surgeon: Clent Demark, MD;  Location: [facility] CATH LAB;  Service: Cardiovascular;  Laterality: N/A;  . TRACHEOSTOMY CLOSURE      There were no vitals filed for this visit.   Subjective Assessment - 10/12/19  1406    Subjective pt arrives reporting "this is a waste of time" " I will just do my exercises at home"    Patient is accompained by: Family member   wife   Currently in Pain? No/denies                                          PT Long Term Goals - 10/04/19 1628      PT LONG TERM GOAL #1   Title Pt will be independent in self MLD for long term management of lymphedema.    Time 4    Period Weeks    Status New      PT LONG TERM GOAL #2   Title Pt will receive appropriate compression garments for long term management of lymphedema    Time 4    Period Weeks    Status New      PT LONG TERM GOAL #3   Title Pt will be independent in a home exercise program for continued strengthening and stretching.    Time 4    Period Weeks    Status New      PT LONG TERM GOAL #4   Title Pt will demonstrate 3+/5 bilateral hip extensor strength to decrease fall risk  Baseline R and L 2+/5    Time 4    Period Weeks    Status New    Target Date 11/06/19      PT LONG TERM GOAL #5   Title Pt will demosntate 130 degrees of left shoulder abduction to allow him to reach out to the sides    Baseline 74 with sharp pain    Time 4    Period Weeks    Status New    Target Date 11/06/19                 Plan - 10/12/19 1424    Clinical Impression Statement Pt arrives today with wife reporting that doing exercise is a waste of time and he can just do all of this at home.  Discussed with pt and wife about how we are not forcing him to do anything but that it would be recommended that he continue to actually do his exercise at home, continue walking, and work on postural modifications.  Pt is agreeable to this.  pt is also agreeable to return in 1 week to start lymphedema CDT for the head and neck with wife agreeable to monitoring the skin distal to the collarbones and let us know if it is still ready to begin treatment.    Consulted and Agree with Plan of Care  Patient;Family member/caregiver           Patient will benefit from skilled therapeutic intervention in order to improve the following deficits and impairments:     Visit Diagnosis: Lymphedema, not elsewhere classified     Problem List Patient Active Problem List   Diagnosis Date Noted  . Diabetes mellitus due to underlying condition with diabetic nephropathy, with long-term current use of insulin ([place]) 09/27/2019  . Malnutrition of moderate degree 08/16/2019  . Hyperkalemia 08/15/2019  . Hyperglycemia 08/15/2019  . SIRS (systemic inflammatory response syndrome) ([place]) 08/15/2019  . AKI (acute kidney injury) ([place]) 08/15/2019  . Oral candidiasis 08/15/2019  . Hypercalcemia 08/15/2019  . 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 Schizophrenia spectrum disorder with psychotic disorder type not yet determined ([place]) 09/24/2015  . Intermittent explosive disorder 09/20/2015  . Severe recurrent major depressive disorder with psychotic features ([place]) 09/20/2015  . Suicidal ideation 09/20/2015  . Homicidal ideation 09/20/2015  . Chest pain at rest 03/19/2013  . Acute non Q wave MI (myocardial infarction), initial episode of care ([place]) 03/06/2013  . Chronic sciatica of left side 09/10/2012  . Personality disorder ([place]) 11/10/2011  . Alcohol use disorder, mild, abuse 10/01/2011  . NSTEMI (non-ST elevated myocardial infarction) ([place]) 10/01/2011  . Erectile dysfunction 08/01/2010  . Urge incontinence of urine 08/01/2010  . Chronic alcoholic pancreatitis ([place]) 07/24/2010  . Tobacco use disorder 07/24/2010  . Hypertension 07/24/2010  . Hyperlipidemia 07/24/2010  . CVA (cerebral vascular accident) ([place]) 07/24/2010  . Chronic back pain 07/24/2010    Stark Bray 10/12/2019, 2:31 PM  [place], [place], [address] Phone: [phone]   Fax:  [phone]  Name: [name] MRN: [id number] Date of Birth: [birth year]

## 2019-10-12 NOTE — Progress Notes (Signed)
Nutrition follow-up completed with patient and wife after MD appointment. Patient is status post radiation therapy for cancer of the oral cavity/floor of mouth. Weight improved and documented as 131 pounds on July 28 from 127 pounds July 14.  This is a 4 pound weight gain over 2 weeks. Patient is not able to take any oral intake by mouth.  Reports he continues to get choked with foods. CBGs are improved per patient and wife. Patient is tolerating [employer] 1.4, 1 carton 5 times daily with 60 mL of free water before and after each bolus feeding.  He is also tolerating Prosource, 30 mL twice a day via G-tube. Tube feeding and Prosource provides 2475 cal, 130 g protein, and 275 g carbohydrate. Patient reports he has plenty of tube feeding supplies.  Estimated nutrition needs: 2400-2600 cal, 120-135 g protein, 2.5 L fluid.  Nutrition diagnosis: Moderate malnutrition is improving.  Intervention: Educated patient to continue to try to sip on water per speech therapy instructions. Enforced importance of complying with swallowing exercises. Continue present tube feeding with added protein. Once patient is safe to add additional liquids and foods, educated on strategies for increasing safely. Provided additional Prosource samples. Questions were answered and teach back method used.  Monitoring, evaluation, goals: Patient will continue to tolerate tube feeding, Prosource, and free water flushes to minimize weight loss.  Next visit: Tuesday, August 24 by telephone.  **Disclaimer: This note was dictated with voice recognition software. Similar sounding words can inadvertently be transcribed and this note may contain transcription errors which may not have been corrected upon publication of note.**

## 2019-10-13 LAB — GLUCOSE, CAPILLARY: Glucose-Capillary: 143 mg/dL — ABNORMAL HIGH (ref 70–99)

## 2019-10-14 NOTE — Progress Notes (Signed)
Radiation Oncology         [phone] ________________________________  Name: [name] MRN: [id number]  Date: 10/11/2019  DOB: Apr 17, 1960  Follow-Up Visit Note  CC: Clinic, [facility] Donna Neomia Dear, MD  Diagnosis and Prior Radiotherapy:       ICD-10-CM   1. 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   Mr. Hoe presents today for 2 week follow-up after completing hypofractionated radiation to his b/l neck and floor of mouth on 09/27/2019  Pain issues, if any: Constant right ear pain (5 out of 10), and right shoulder pain from recent PT session Using a feeding tube?: Yes--still primary source of nutrition. Instills 1 carton of supplement 5x daily, as well a protein supplement 2x daily. Continues to be followed closely by dietician (phone visit on 07/20/201 with Bard Neff-RD) Weight changes, if any:  Wt Readings from Last 3 Encounters:  10/11/19 131 lb (59.4 kg)  09/27/19 127 lb (57.6 kg)  09/15/19 130 lb 6.4 oz (59.1 kg)   Swallowing issues, if any: Yes, last saw Bridgett Larsson on 10/02/2019: "Pt remains PEG dependent since Jul 31, 2019. Despite SLP directions to have something PO x3/day, pt endorsed today extremely rare PO. See "other comments" for details. Pt having difficulty today clearing secretions with one episode of immediate cough with 1/2 teaspoon bolus H2O presented via teaspoon and minor head tilt back to foster oral transit of water bolus." Smoking or chewing tobacco? None Using fluoride trays daily? N/A Last ENT visit was on: 08/10/2019 Saw Dr. Harriett Rush [place] who plans to see patient again 6 weeks after completing radiation.   Other notable issues, if any: Swelling under jaw has improved, but patient feels chin is more swollen. Denies any issues opening/closing mouth. Skin appears to be  healing in treatment field.                      ALLERGIES:  is allergic to morphine and related.  Meds: Current Outpatient Medications  Medication Sig Dispense Refill  . aspirin EC 81 MG EC tablet Take 1 tablet (81 mg total) by mouth daily. 30 tablet 3  . atorvastatin (LIPITOR) 80 MG tablet Take 80 mg by mouth at bedtime.     . clopidogrel (PLAVIX) 75 MG tablet Take 75 mg by mouth daily.     . cyclobenzaprine (FLEXERIL) 10 MG tablet Take 10 mg by mouth 3 (three) times daily as needed for muscle spasms.     . fluconazole (DIFLUCAN) 40 MG/ML suspension Take 5 mL today, then, 2.64mL daily for 20 more days. 55 mL 0  . insulin isophane & regular human (HUMULIN 70/30 MIX) (70-30) 100 UNIT/ML KwikPen Dose based on SSI coverage chart provided to patient by clinic 15 mL 11  . Insulin Pen Needle 32G X 4 MM MISC 12 Units by Does not apply route daily. 100 each 3  . magnesium oxide (MAG-OX) 400 MG tablet Take 400 mg by mouth 2 (two) times daily.     . metoprolol tartrate (LOPRESSOR) 50 MG tablet Take 50 mg by mouth in the morning and at bedtime.     . phenytoin (DILANTIN) 125 MG/5ML suspension Take 4 mLs (100 mg total) by mouth 3 (three) times daily. 237 mL 2  . scopolamine (TRANSDERM-SCOP) 1 MG/3DAYS Place 1  patch (1.5 mg total) onto the skin every 3 (three) days. 10 patch 1  . sodium fluoride (PREVIDENT 5000 PLUS) 1.1 % CREA dental cream Apply thin ribbon of cream to tooth brush. Brush teeth for 2 minutes. Spit out excess-DO NOT swallow. DO NOT rinse afterwards. Repeat nightly. 1 Tube prn  . valproic acid (DEPAKENE) 250 MG/5ML solution Take 10 mLs (500 mg total) by mouth in the morning and at bedtime. 600 mL 2  . Water For Irrigation, Sterile (FREE WATER) SOLN Place 120 mLs into feeding tube 5 (five) times daily.    Mar[place] Kitchen glipiZIDE (GLUCOTROL) 5 MG tablet Take 5 mg by mouth 2 (two) times daily before a meal.     . insulin detemir (LEVEMIR) 100 UNIT/ML FlexPen Inject 12 Units into the skin daily. May  substitute for Lantus FlexPen if Levemir is not available 15 mL 3  . lidocaine (XYLOCAINE) 2 % solution Patient: Mix 1part 2% viscous lidocaine, 1part H20. Swish & swallow 26mL of diluted mixture, 15min before meals and at bedtime, up to QID 200 mL 2   No current facility-administered medications for this encounter.    Physical Findings: The patient is in no acute distress. Patient is alert and oriented. Wt Readings from Last 3 Encounters:  10/11/19 131 lb (59.4 kg)  09/27/19 127 lb (57.6 kg)  09/15/19 130 lb 6.4 oz (59.1 kg)    height is 5\' 10"  (1.778 m) and weight is 131 lb (59.4 kg). His oral temperature is 99.4 F (37.4 C). His blood pressure is 125/72 and his pulse is 81. His respiration is 18 and oxygen saturation is 100%. .  General: Alert and oriented, in no acute distress, in WC HEENT: Oropharynx is notable for thrush but no sign of recurrence Neck: Neck is notable for healing dry desquamation, no palpable masses Skin: Skin in treatment fields as above Extremities: No cyanosis or edema. Lymphatics: see Neck Exam Psychiatric: Judgment and insight are intact. Affect is appropriate.   Lab Findings: Lab Results  Component Value Date   WBC 7.3 09/29/2019   HGB 11.5 (L) 09/29/2019   HCT 35.9 (L) 09/29/2019   MCV 88.0 09/29/2019   PLT 373 09/29/2019    Lab Results  Component Value Date   TSH 1.101 08/15/2019    Radiographic Findings: No results found.  Impression/Plan:    1) Head and Neck Cancer Status: healing well from RT  2) Nutritional Status: escalate diet as tolerated, per nutritionist advice PEG tube: using  3) Risk Factors: The patient has been educated about risk factors including alcohol and tobacco abuse; they understand that avoidance of alcohol and tobacco is important to prevent recurrences as well as other cancers  4) Swallowing: dysphagia: continue SLP  5) Dental: Encouraged to continue regular followup with dentistry, and dental hygiene as much  as [place] will provide.  I did my best to mold the RT fields away from intact teeth to reduce risks.   6) Thyroid function: check annually. Lab Results  Component Value Date   TSH 1.101 08/15/2019    7) Other: I conferred today with Drs Hendricks Limes and Edison Nasuti about f/u imaging to restage his cancer. Dr. Hendricks Limes will arrange this. Wife requests this not be done in Sept.  8) We discussed measures to reduce the risk of infection during the COVID-19 pandemic, as we have several times.  Despite my urging, patient does not want to get vaccinated.  His wife, after a long talk, says she will to protect herself and  him.  I helped her get scheduled for a vaccine in 2 days with the local health dept.  9) Follow-up in 4 months. The patient was encouraged to call with any issues or questions before then.  10) nursing will update pt's PCP on his progress and his questions about DM meds.  On date of service, in total, I spent 30 minutes on this encounter. Patient was seen in person. _____________________________________   Eppie Gibson, MD

## 2019-10-16 ENCOUNTER — Ambulatory Visit: Payer: No Typology Code available for payment source | Attending: Radiation Oncology | Admitting: Physical Therapy

## 2019-10-16 ENCOUNTER — Other Ambulatory Visit: Payer: Self-pay

## 2019-10-16 ENCOUNTER — Encounter: Payer: Self-pay | Admitting: Physical Therapy

## 2019-10-16 DIAGNOSIS — I890 Lymphedema, not elsewhere classified: Secondary | ICD-10-CM | POA: Insufficient documentation

## 2019-10-16 DIAGNOSIS — M25612 Stiffness of left shoulder, not elsewhere classified: Secondary | ICD-10-CM | POA: Diagnosis present

## 2019-10-16 DIAGNOSIS — C040 Malignant neoplasm of anterior floor of mouth: Secondary | ICD-10-CM | POA: Diagnosis present

## 2019-10-16 DIAGNOSIS — R293 Abnormal posture: Secondary | ICD-10-CM | POA: Insufficient documentation

## 2019-10-16 DIAGNOSIS — M6281 Muscle weakness (generalized): Secondary | ICD-10-CM | POA: Insufficient documentation

## 2019-10-16 NOTE — Therapy (Signed)
[place], [place], [address] Phone: [phone]   Fax:  [phone]  Physical Therapy Treatment  Patient Details  Name: [name] MRN: [id number] Date of Birth: [birth year] Referring Provider (PT): Reita May Date: 10/16/2019   PT End of Session - 10/16/19 0852    Visit Number 4    Number of Visits 10    Date for PT Re-Evaluation 11/06/19    PT Start Time 0803    PT Stop Time 0848    PT Time Calculation (min) 45 min    Activity Tolerance Patient tolerated treatment well    Behavior During Therapy [facility] for tasks assessed/performed           Past Medical History:  Diagnosis Date  . Anxiety   . Cancer ([place])    mouth  . Chronic pancreatitis ([place])   . Coronary artery disease   . Depression   . Diabetes mellitus without complication ([place])   . Hyperlipidemia   . Hypertension   . Myocardial infarction ([place])   . Neuromuscular disorder ([place])   . Sciatica   . Seizures ([place])   . Stroke ([place])   . Substance abuse [facility])     Past Surgical History:  Procedure Laterality Date  . BACK SURGERY    . CORONARY STENT PLACEMENT    . GASTROSTOMY TUBE PLACEMENT Left 07/31/2019   [facility]  . LEFT HEART CATHETERIZATION WITH CORONARY ANGIOGRAM N/A 10/01/2011   Procedure: LEFT HEART CATHETERIZATION WITH CORONARY ANGIOGRAM;  Surgeon: Clent Demark, MD;  Location: [facility] CATH LAB;  Service: Cardiovascular;  Laterality: N/A;  . LEFT HEART CATHETERIZATION WITH CORONARY ANGIOGRAM N/A 03/06/2013   Procedure: LEFT HEART CATHETERIZATION WITH CORONARY ANGIOGRAM;  Surgeon: Clent Demark, MD;  Location: [place] CATH LAB;  Service: Cardiovascular;  Laterality: N/A;  . PERCUTANEOUS CORONARY STENT INTERVENTION (PCI-S) N/A 10/02/2011   Procedure: PERCUTANEOUS CORONARY STENT INTERVENTION (PCI-S);  Surgeon: Clent Demark, MD;  Location: [facility] CATH LAB;  Service: Cardiovascular;  Laterality: N/A;  . TRACHEOSTOMY CLOSURE       There were no vitals filed for this visit.   Subjective Assessment - 10/16/19 0806    Subjective My swelling is about the same.    Patient is accompained by: Family member    Patient Stated Goals to gain all info from head and neck providers    Currently in Pain? No/denies    Pain Score 0-No pain                             [facility] Adult PT Treatment/Exercise - 10/16/19 0001      Manual Therapy   Manual Therapy Manual Lymphatic Drainage (MLD);Edema management;Myofascial release    Edema Management created foam chip pack in TG for pt to tie around head to place compression just under chin in area of fibrosis - educated pt not to tie this too tightly and to wear as much as possible    Myofascial Release scar mobilization along anterior neck and submental scar where flow of lymph is blocked- instructed pt's wife and had her return demonstrate - used cross finger technique and circles    Manual Lymphatic Drainage (MLD) 5 diaphragmatic breaths, short neck, bilateral axillary nodes, bilateral pectoral nodes, short neck, posterior, lateral and anterior neck moving fluid towards pathways then to chin and mandibular areas moving fluid down towards pathways retracing all steps- educated pt and wife throughout  on correct technique and sequence, had pt's spouse return demonstrate providing hand over hand instruction as well as verbal cues                       PT Long Term Goals - 10/04/19 1628      PT LONG TERM GOAL #1   Title Pt will be independent in self MLD for long term management of lymphedema.    Time 4    Period Weeks    Status New      PT LONG TERM GOAL #2   Title Pt will receive appropriate compression garments for long term management of lymphedema    Time 4    Period Weeks    Status New      PT LONG TERM GOAL #3   Title Pt will be independent in a home exercise program for continued strengthening and stretching.    Time 4    Period Weeks     Status New      PT LONG TERM GOAL #4   Title Pt will demonstrate 3+/5 bilateral hip extensor strength to decrease fall risk    Baseline R and L 2+/5    Time 4    Period Weeks    Status New    Target Date 11/06/19      PT LONG TERM GOAL #5   Title Pt will demosntate 130 degrees of left shoulder abduction to allow him to reach out to the sides    Baseline 74 with sharp pain    Time 4    Period Weeks    Status New    Target Date 11/06/19                 Plan - 10/16/19 0853    Clinical Impression Statement All exercise goals deferred at last visit because pt felt it was a waste of time. Focused today on instructing pt and spouse in MLD for head and neck to decrease swelling. Had pt's wife return demonstrate correct technique. She was able to demonstrate appropriate skin stretch but still requires moderate cues for direction of skin stretch and sequencing. Also instructed pt and spouse in scar mobilization since pt's scar is preventing flow of lymphatic fluid. Pt and wife to practice MLD over the next week and will continue to instruct at next session until they are independent with this. Created foam chip pack for pt to wear around head with chip pack just under chin to help soften area of fibrosis.    PT Frequency 2x / week    PT Duration 4 weeks    PT Treatment/Interventions ADLs/Self Care Home Management;Therapeutic exercise;Manual techniques;Manual lymph drainage;Compression bandaging;Taping;Passive range of motion;Scar mobilization;Vasopneumatic Device;Therapeutic activities;Neuromuscular re-education    PT Next Visit Plan conitnue MLD for head and neck, assess pt and wife independence with this, scar massage- sent email to Vicente Males and Lenna Sciara about coverage for pump and garments    PT Home Exercise Plan head and neck ROM exercises,Access Code: [email or web address]    Consulted and Agree with Plan of Care Patient;Family member/caregiver           Patient will benefit from skilled  therapeutic intervention in order to improve the following deficits and impairments:  Postural dysfunction, Impaired UE functional use, Decreased range of motion, Decreased knowledge of precautions, Increased edema, Increased fascial restricitons, Decreased strength  Visit Diagnosis: Lymphedema, not elsewhere classified     Problem List Patient Active Problem List   Diagnosis  Date Noted  . Diabetes mellitus due to underlying condition with diabetic nephropathy, with long-term current use of insulin ([place]) 09/27/2019  . Malnutrition of moderate degree 08/16/2019  . Hyperkalemia 08/15/2019  . Hyperglycemia 08/15/2019  . SIRS (systemic inflammatory response syndrome) ([place]) 08/15/2019  . AKI (acute kidney injury) ([place]) 08/15/2019  . Oral candidiasis 08/15/2019  . Hypercalcemia 08/15/2019  . 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 Schizophrenia spectrum disorder with psychotic disorder type not yet determined ([place]) 09/24/2015  . Intermittent explosive disorder 09/20/2015  . Severe recurrent major depressive disorder with psychotic features ([place]) 09/20/2015  . Suicidal ideation 09/20/2015  . Homicidal ideation 09/20/2015  . Chest pain at rest 03/19/2013  . Acute non Q wave MI (myocardial infarction), initial episode of care ([place]) 03/06/2013  . Chronic sciatica of left side 09/10/2012  . Personality disorder ([place]) 11/10/2011  . Alcohol use disorder, mild, abuse 10/01/2011  . NSTEMI (non-ST elevated myocardial infarction) ([place]) 10/01/2011  . Erectile dysfunction 08/01/2010  . Urge incontinence of urine 08/01/2010  . Chronic alcoholic pancreatitis ([place]) 07/24/2010  . Tobacco use disorder 07/24/2010  . Hypertension 07/24/2010  . Hyperlipidemia 07/24/2010  . CVA (cerebral vascular accident) ([place]) 07/24/2010  . Chronic back pain 07/24/2010    Allyson Sabal [facility] 10/16/2019, [place], [place], [address] Phone: [phone]   Fax:  [phone]  Name: Jocelyn Lowery MRN: [id number] Date of Birth: 1960/09/13  Manus Gunning, PT 10/16/19 9:05 AM

## 2019-10-18 ENCOUNTER — Other Ambulatory Visit: Payer: Self-pay

## 2019-10-18 ENCOUNTER — Ambulatory Visit: Payer: No Typology Code available for payment source

## 2019-10-18 DIAGNOSIS — M6281 Muscle weakness (generalized): Secondary | ICD-10-CM

## 2019-10-18 DIAGNOSIS — I890 Lymphedema, not elsewhere classified: Secondary | ICD-10-CM

## 2019-10-18 DIAGNOSIS — M25612 Stiffness of left shoulder, not elsewhere classified: Secondary | ICD-10-CM

## 2019-10-18 DIAGNOSIS — C040 Malignant neoplasm of anterior floor of mouth: Secondary | ICD-10-CM

## 2019-10-18 DIAGNOSIS — R293 Abnormal posture: Secondary | ICD-10-CM

## 2019-10-18 NOTE — Therapy (Signed)
[place], [place], [address] Phone: [phone]   Fax:  [phone]  Physical Therapy Treatment  Patient Details  Name: [name] MRN: [id number] Date of Birth: [birth year] Referring Provider (PT): Reita May Date: 10/18/2019   PT End of Session - 10/18/19 0907    Visit Number 5    Number of Visits 10    Date for PT Re-Evaluation 11/06/19    PT Start Time 0807    PT Stop Time 0906    PT Time Calculation (min) 59 min    Activity Tolerance Patient tolerated treatment well    Behavior During Therapy [facility] for tasks assessed/performed           Past Medical History:  Diagnosis Date  . Anxiety   . Cancer ([place])    mouth  . Chronic pancreatitis ([place])   . Coronary artery disease   . Depression   . Diabetes mellitus without complication ([place])   . Hyperlipidemia   . Hypertension   . Myocardial infarction ([place])   . Neuromuscular disorder ([place])   . Sciatica   . Seizures ([place])   . Stroke ([place])   . Substance abuse [facility])     Past Surgical History:  Procedure Laterality Date  . BACK SURGERY    . CORONARY STENT PLACEMENT    . GASTROSTOMY TUBE PLACEMENT Left 07/31/2019   [facility]  . LEFT HEART CATHETERIZATION WITH CORONARY ANGIOGRAM N/A 10/01/2011   Procedure: LEFT HEART CATHETERIZATION WITH CORONARY ANGIOGRAM;  Surgeon: Clent Demark, MD;  Location: [facility] CATH LAB;  Service: Cardiovascular;  Laterality: N/A;  . LEFT HEART CATHETERIZATION WITH CORONARY ANGIOGRAM N/A 03/06/2013   Procedure: LEFT HEART CATHETERIZATION WITH CORONARY ANGIOGRAM;  Surgeon: Clent Demark, MD;  Location: [place] CATH LAB;  Service: Cardiovascular;  Laterality: N/A;  . PERCUTANEOUS CORONARY STENT INTERVENTION (PCI-S) N/A 10/02/2011   Procedure: PERCUTANEOUS CORONARY STENT INTERVENTION (PCI-S);  Surgeon: Clent Demark, MD;  Location: [facility] CATH LAB;  Service: Cardiovascular;  Laterality: N/A;  . TRACHEOSTOMY CLOSURE       There were no vitals filed for this visit.   Subjective Assessment - 10/18/19 0819    Subjective I was doing the shoulder exercises she gave me pressing into the wall but my Lt shoulder started to hurt so I stopped and now. It's really weak, I can't lift my arm overhead any more by myself but can with my other hand. I've just noticed that since the surgery.    Patient is accompained by: Family member    Patient Stated Goals to gain all info from head and neck providers    Currently in Pain? No/denies                             [facility] Adult PT Treatment/Exercise - 10/18/19 0001      Manual Therapy   Myofascial Release scar mobilization along anterior neck and submental scar where flow of lymph is blocked- instructed pt's wife and had her return demonstrate - used cross finger technique and circles    Manual Lymphatic Drainage (MLD) 5 diaphragmatic breaths, short neck, bilateral axillary nodes, bilateral pectoral nodes, short neck, posterior, lateral and anterior neck moving fluid towards pathways then to chin and mandibular areas moving fluid down towards pathways retracing all steps- educated pt and wife throughout on correct technique and sequence, had pt return demo (he was not interested in his wife  learning and wanted to do himself) and used hand over hand technique to instruct in correction direction of stretch and correct light pressure                       PT Long Term Goals - 10/04/19 1628      PT LONG TERM GOAL #1   Title Pt will be independent in self MLD for long term management of lymphedema.    Time 4    Period Weeks    Status New      PT LONG TERM GOAL #2   Title Pt will receive appropriate compression garments for long term management of lymphedema    Time 4    Period Weeks    Status New      PT LONG TERM GOAL #3   Title Pt will be independent in a home exercise program for continued strengthening and stretching.    Time 4     Period Weeks    Status New      PT LONG TERM GOAL #4   Title Pt will demonstrate 3+/5 bilateral hip extensor strength to decrease fall risk    Baseline R and L 2+/5    Time 4    Period Weeks    Status New    Target Date 11/06/19      PT LONG TERM GOAL #5   Title Pt will demosntate 130 degrees of left shoulder abduction to allow him to reach out to the sides    Baseline 74 with sharp pain    Time 4    Period Weeks    Status New    Target Date 11/06/19                 Plan - 10/18/19 1020    Clinical Impression Statement Spent first part of session answering pts questions initially about weakness he has been noticing in Lt shoulder. He is able to lift arm to about 90 degrees of flexion and can not lift it higher due to weakness unless he uses Rt hand to pull it up which he can do easily. Even though he said it started bothering him after beginning isometrics, encouraged him to continue these instructing him that these are easiest strngthening exercises to do and if he should continue with shoulder weakness like that he needs to let his doctor know. He reports overall he's noticed this since surgery as he had normal ROM before. Then reviewed self manual lymph drainage with pt while performing. Did not have wife return demo as pt reports he will just do it himself. Used hand over hand technique to instruct pt in light pressure and correct direction of stretch. He will benefit from further instruction of this but he would like to decrease freq to 1x/wk until he becomes independent with this.    Personal Factors and Comorbidities Comorbidity 3+;Fitness    Comorbidities stroke, back surgery, seizures, diabetes    Examination-Activity Limitations Reach Overhead    Examination-Participation Restrictions Community Activity;Yard Work    Merchant navy officer Evolving/Moderate complexity    Rehab Potential Good    PT Frequency 2x / week    PT Duration 4 weeks    PT  Treatment/Interventions ADLs/Self Care Home Management;Therapeutic exercise;Manual techniques;Manual lymph drainage;Compression bandaging;Taping;Passive range of motion;Scar mobilization;Vasopneumatic Device;Therapeutic activities;Neuromuscular re-education    PT Next Visit Plan conitnue MLD for head and neck, assess pt with independence with this, scar massage- sent email to [place] and  [place] about coverage for pump and garments    Consulted and Agree with Plan of Care Patient;Family member/caregiver    Family Member Consulted Wife, [name]           Patient will benefit from skilled therapeutic intervention in order to improve the following deficits and impairments:  Postural dysfunction, Impaired UE functional use, Decreased range of motion, Decreased knowledge of precautions, Increased edema, Increased fascial restricitons, Decreased strength  Visit Diagnosis: Lymphedema, not elsewhere classified  Stiffness of left shoulder, not elsewhere classified  Muscle weakness (generalized)  Abnormal posture  Carcinoma of anterior part of floor of mouth [facility])     Problem List Patient Active Problem List   Diagnosis Date Noted  . Diabetes mellitus due to underlying condition with diabetic nephropathy, with long-term current use of insulin ([place]) 09/27/2019  . Malnutrition of moderate degree 08/16/2019  . Hyperkalemia 08/15/2019  . Hyperglycemia 08/15/2019  . SIRS (systemic inflammatory response syndrome) ([place]) 08/15/2019  . AKI (acute kidney injury) ([place]) 08/15/2019  . Oral candidiasis 08/15/2019  . Hypercalcemia 08/15/2019  . 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 Schizophrenia spectrum disorder with psychotic disorder type not yet determined ([place]) 09/24/2015  . Intermittent explosive disorder 09/20/2015  . Severe recurrent major depressive disorder with psychotic features ([place]) 09/20/2015  . Suicidal ideation 09/20/2015  . Homicidal ideation 09/20/2015  .  Chest pain at rest 03/19/2013  . Acute non Q wave MI (myocardial infarction), initial episode of care ([place]) 03/06/2013  . Chronic sciatica of left side 09/10/2012  . Personality disorder ([place]) 11/10/2011  . Alcohol use disorder, mild, abuse 10/01/2011  . NSTEMI (non-ST elevated myocardial infarction) ([place]) 10/01/2011  . Erectile dysfunction 08/01/2010  . Urge incontinence of urine 08/01/2010  . Chronic alcoholic pancreatitis ([place]) 07/24/2010  . Tobacco use disorder 07/24/2010  . Hypertension 07/24/2010  . Hyperlipidemia 07/24/2010  . CVA (cerebral vascular accident) ([place]) 07/24/2010  . Chronic back pain 07/24/2010    Otelia Limes, PTA 10/18/2019, 11:21 AM  [place], [place], [address] Phone: [phone]   Fax:  [phone]  Name: [name] MRN: [id number] Date of Birth: 1960-11-27

## 2019-10-24 ENCOUNTER — Other Ambulatory Visit: Payer: Self-pay

## 2019-10-24 ENCOUNTER — Encounter: Payer: Self-pay | Admitting: Physical Therapy

## 2019-10-24 ENCOUNTER — Ambulatory Visit: Payer: No Typology Code available for payment source | Admitting: Physical Therapy

## 2019-10-24 DIAGNOSIS — I890 Lymphedema, not elsewhere classified: Secondary | ICD-10-CM | POA: Diagnosis not present

## 2019-10-24 NOTE — Therapy (Signed)
[place], [place], [address] Phone: [phone]   Fax:  [phone]  Physical Therapy Treatment  Patient Details  Name: [name] MRN: [id number] Date of Birth: Feb 24, 1961 Referring Provider (PT): Reita May Date: 10/24/2019   PT End of Session - 10/24/19 1335    Visit Number 6    Number of Visits 10    Date for PT Re-Evaluation 11/06/19    PT Start Time 1304    PT Stop Time 1321    PT Time Calculation (min) 17 min    Activity Tolerance Treatment limited secondary to medical complications (Comment)   possible abcess   Behavior During Therapy [facility] for tasks assessed/performed           Past Medical History:  Diagnosis Date   Anxiety    Cancer ([place])    mouth   Chronic pancreatitis ([place])    Coronary artery disease    Depression    Diabetes mellitus without complication (HCC)    Hyperlipidemia    Hypertension    Myocardial infarction ([place])    Neuromuscular disorder ([place])    Sciatica    Seizures ([place])    Stroke ([place])    Substance abuse ([place])     Past Surgical History:  Procedure Laterality Date   BACK SURGERY     CORONARY STENT PLACEMENT     GASTROSTOMY TUBE PLACEMENT Left 07/31/2019   [facility]   LEFT HEART CATHETERIZATION WITH CORONARY ANGIOGRAM N/A 10/01/2011   Procedure: LEFT HEART CATHETERIZATION WITH CORONARY ANGIOGRAM;  Surgeon: Clent Demark, MD;  Location: [place] CATH LAB;  Service: Cardiovascular;  Laterality: N/A;   LEFT HEART CATHETERIZATION WITH CORONARY ANGIOGRAM N/A 03/06/2013   Procedure: LEFT HEART CATHETERIZATION WITH CORONARY ANGIOGRAM;  Surgeon: Clent Demark, MD;  Location: [place] CATH LAB;  Service: Cardiovascular;  Laterality: N/A;   PERCUTANEOUS CORONARY STENT INTERVENTION (PCI-S) N/A 10/02/2011   Procedure: PERCUTANEOUS CORONARY STENT INTERVENTION (PCI-S);  Surgeon: Clent Demark, MD;  Location[facility] C[facility] LAB;  Service: Cardiovascular;   Laterality: N/A;   TRACHEOSTOMY CLOSURE      There were no vitals filed for this visit.   Subjective Assessment - 10/24/19 1306    Subjective Thursday or Friday my neck got swollen and burst. It is still draining.    Patient Stated Goals to gain all info from head and neck providers    Currently in Pain? No/denies    Pain Score 0-No pain                                     PT Education - 10/24/19 1334    Education Details not to do MLD with new medical development due to the fact that it may cause an infection to spread, need to have new open area seen by doctor, use less pressure with shoulder isometrics to avoid shoulder pain    Person(s) Educated Patient;Spouse    Methods Explanation    Comprehension Verbalized understanding               PT Long Term Goals - 10/04/19 1628      PT LONG TERM GOAL #1   Title Pt will be independent in self MLD for long term management of lymphedema.    Time 4    Period Weeks    Status New      PT LONG TERM GOAL #  2   Title Pt will receive appropriate compression garments for long term management of lymphedema    Time 4    Period Weeks    Status New      PT LONG TERM GOAL #3   Title Pt will be independent in a home exercise program for continued strengthening and stretching.    Time 4    Period Weeks    Status New      PT LONG TERM GOAL #4   Title Pt will demonstrate 3+/5 bilateral hip extensor strength to decrease fall risk    Baseline R and L 2+/5    Time 4    Period Weeks    Status New    Target Date 11/06/19      PT LONG TERM GOAL #5   Title Pt will demosntate 130 degrees of left shoulder abduction to allow him to reach out to the sides    Baseline 74 with sharp pain    Time 4    Period Weeks    Status New    Target Date 11/06/19                 Plan - 10/24/19 1337    Clinical Impression Statement Pt arrived to appointment today with bandage on anterior neck. Pt reports that  Thursday or Friday his jaw began to swell up and became very painful and later it ruptured (bloody fluid). Pt reports that this area is still draining. Educated pt and spouse that a doctor needs to see him prior to pt continuing therapy due to possible infection and that MLD can spread an infection. Called [facility] RN and left voicemail regarding pt's current status. Pt does have an appointment to follow up with his surgeon on Thursday morning. Educated pt that if he begins to feel bad, has increased pain or the area turns red to seek medical attention. Also educated pt to use less pressure when doing shoulder isometrics at home for strengthening to avoid pain. No MLD performed this session.    PT Frequency 1x / week    PT Duration 4 weeks    PT Treatment/Interventions ADLs/Self Care Home Management;Therapeutic exercise;Manual techniques;Manual lymph drainage;Compression bandaging;Taping;Passive range of motion;Scar mobilization;Vasopneumatic Device;Therapeutic activities;Neuromuscular re-education    PT Next Visit Plan conitnue MLD for head and neck, assess pt with independence with this, scar massage- sent email to Vicente Males and Lenna Sciara about coverage for pump and garments    PT Home Exercise Plan head and neck ROM exercises,Access Code: [email or web address]    Consulted and Agree with Plan of Care Patient;Family member/caregiver    Family Member Consulted Wife, [name]           Patient will benefit from skilled therapeutic intervention in order to improve the following deficits and impairments:  Postural dysfunction, Impaired UE functional use, Decreased range of motion, Decreased knowledge of precautions, Increased edema, Increased fascial restricitons, Decreased strength  Visit Diagnosis: Lymphedema, not elsewhere classified     Problem List Patient Active Problem List   Diagnosis Date Noted   Diabetes mellitus due to underlying condition with diabetic nephropathy, with long-term current use of  insulin ([place]) 09/27/2019   Malnutrition of moderate degree 08/16/2019   Hyperkalemia 08/15/2019   Hyperglycemia 08/15/2019   SIRS (systemic inflammatory response syndrome) ([place]) 08/15/2019   AKI (acute kidney injury) ([place]) 08/15/2019   Oral candidiasis 08/15/2019   Hypercalcemia 08/15/2019   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 ([place]) 08/08/2019   Schizophrenia spectrum  disorder with psychotic disorder type not yet determined ([place]) 09/24/2015   Intermittent explosive disorder 09/20/2015   Severe recurrent major depressive disorder with psychotic features ([place]) 09/20/2015   Suicidal ideation 09/20/2015   Homicidal ideation 09/20/2015   Chest pain at rest 03/19/2013   Acute non Q wave MI (myocardial infarction), initial episode of care ([place]) 03/06/2013   Chronic sciatica of left side 09/10/2012   Personality disorder ([place]) 11/10/2011   Alcohol use disorder, mild, abuse 10/01/2011   NSTEMI (non-ST elevated myocardial infarction) ([place]) 10/01/2011   Erectile dysfunction 08/01/2010   Urge incontinence of urine 08/01/2010   Chronic alcoholic pancreatitis ([place]) 07/24/2010   Tobacco use disorder 07/24/2010   Hypertension 07/24/2010   Hyperlipidemia 07/24/2010   CVA (cerebral vascular accident) ([place]) 07/24/2010   Chronic back pain 07/24/2010    Allyson Sabal [facility] 10/24/2019, 1:40 PM  [place], [place], [address] Phone: [phone]   Fax:  [phone]  Name: Bronco Mcgrory MRN: [id number] Date of Birth: [birth year]  Manus Gunning, PT 10/24/19 1:41 PM

## 2019-10-26 ENCOUNTER — Ambulatory Visit: Payer: No Typology Code available for payment source | Admitting: Speech Pathology

## 2019-10-26 DIAGNOSIS — F17210 Nicotine dependence, cigarettes, uncomplicated: Secondary | ICD-10-CM | POA: Diagnosis not present

## 2019-10-26 DIAGNOSIS — C069 Malignant neoplasm of mouth, unspecified: Secondary | ICD-10-CM | POA: Diagnosis not present

## 2019-10-26 DIAGNOSIS — R1312 Dysphagia, oropharyngeal phase: Secondary | ICD-10-CM | POA: Diagnosis not present

## 2019-10-26 DIAGNOSIS — R220 Localized swelling, mass and lump, head: Secondary | ICD-10-CM | POA: Diagnosis not present

## 2019-10-26 DIAGNOSIS — I2510 Atherosclerotic heart disease of native coronary artery without angina pectoris: Secondary | ICD-10-CM | POA: Diagnosis not present

## 2019-10-26 DIAGNOSIS — F209 Schizophrenia, unspecified: Secondary | ICD-10-CM | POA: Diagnosis not present

## 2019-10-26 DIAGNOSIS — Z7289 Other problems related to lifestyle: Secondary | ICD-10-CM | POA: Diagnosis not present

## 2019-10-26 DIAGNOSIS — Z8673 Personal history of transient ischemic attack (TIA), and cerebral infarction without residual deficits: Secondary | ICD-10-CM | POA: Diagnosis not present

## 2019-10-30 ENCOUNTER — Encounter: Payer: Medicare PPO | Admitting: Physical Therapy

## 2019-11-01 ENCOUNTER — Other Ambulatory Visit: Payer: Self-pay

## 2019-11-01 ENCOUNTER — Encounter: Payer: Self-pay | Admitting: Physical Therapy

## 2019-11-01 ENCOUNTER — Ambulatory Visit: Payer: No Typology Code available for payment source | Admitting: Physical Therapy

## 2019-11-01 DIAGNOSIS — I890 Lymphedema, not elsewhere classified: Secondary | ICD-10-CM

## 2019-11-01 NOTE — Therapy (Signed)
[place], [place], [address] Phone: [phone]   Fax:  [phone]  Physical Therapy Treatment  Patient Details  Name: [name] MRN: [id number] Date of Birth: 10-Aug-1960 Referring Provider (PT): Reita May Date: 11/01/2019   PT End of Session - 11/01/19 1358    Visit Number 7    Number of Visits 10    Date for PT Re-Evaluation 11/06/19    PT Start Time 0254    PT Stop Time 1354    PT Time Calculation (min) 51 min    Activity Tolerance Patient tolerated treatment well    Behavior During Therapy [facility] for tasks assessed/performed           Past Medical History:  Diagnosis Date  . Anxiety   . Cancer ([place])    mouth  . Chronic pancreatitis ([place])   . Coronary artery disease   . Depression   . Diabetes mellitus without complication ([place])   . Hyperlipidemia   . Hypertension   . Myocardial infarction ([place])   . Neuromuscular disorder ([place])   . Sciatica   . Seizures ([place])   . Stroke ([place])   . Substance abuse [facility])     Past Surgical History:  Procedure Laterality Date  . BACK SURGERY    . CORONARY STENT PLACEMENT    . GASTROSTOMY TUBE PLACEMENT Left 07/31/2019   [facility]  . LEFT HEART CATHETERIZATION WITH CORONARY ANGIOGRAM N/A 10/01/2011   Procedure: LEFT HEART CATHETERIZATION WITH CORONARY ANGIOGRAM;  Surgeon: Clent Demark, MD;  Location: [facility] CATH LAB;  Service: Cardiovascular;  Laterality: N/A;  . LEFT HEART CATHETERIZATION WITH CORONARY ANGIOGRAM N/A 03/06/2013   Procedure: LEFT HEART CATHETERIZATION WITH CORONARY ANGIOGRAM;  Surgeon: Clent Demark, MD;  Location: [place] CATH LAB;  Service: Cardiovascular;  Laterality: N/A;  . PERCUTANEOUS CORONARY STENT INTERVENTION (PCI-S) N/A 10/02/2011   Procedure: PERCUTANEOUS CORONARY STENT INTERVENTION (PCI-S);  Surgeon: Clent Demark, MD;  Location: [facility] CATH LAB;  Service: Cardiovascular;  Laterality: N/A;  . TRACHEOSTOMY CLOSURE       There were no vitals filed for this visit.   Subjective Assessment - 11/01/19 1307    Subjective I want to get rid of the swelling where it is hard.    Patient Stated Goals to gain all info from head and neck providers    Currently in Pain? No/denies    Pain Score 0-No pain                             [facility] Adult PT Treatment/Exercise - 11/01/19 0001      Manual Therapy   Manual Therapy Manual Lymphatic Drainage (MLD);Soft tissue mobilization    Soft tissue mobilization to area of fibrosis at chin and submandibular areas to help soften    Manual Lymphatic Drainage (MLD) 5 diaphragmatic breaths, short neck, bilateral axillary nodes, bilateral pectoral nodes, short neck, posterior, lateral and anterior neck moving fluid towards pathways then to chin and mandibular areas moving fluid down towards pathways retracing all steps                       PT Long Term Goals - 10/04/19 1628      PT LONG TERM GOAL #1   Title Pt will be independent in self MLD for long term management of lymphedema.    Time 4    Period Weeks  Status New      PT LONG TERM GOAL #2   Title Pt will receive appropriate compression garments for long term management of lymphedema    Time 4    Period Weeks    Status New      PT LONG TERM GOAL #3   Title Pt will be independent in a home exercise program for continued strengthening and stretching.    Time 4    Period Weeks    Status New      PT LONG TERM GOAL #4   Title Pt will demonstrate 3+/5 bilateral hip extensor strength to decrease fall risk    Baseline R and L 2+/5    Time 4    Period Weeks    Status New    Target Date 11/06/19      PT LONG TERM GOAL #5   Title Pt will demosntate 130 degrees of left shoulder abduction to allow him to reach out to the sides    Baseline 74 with sharp pain    Time 4    Period Weeks    Status New    Target Date 11/06/19                 Plan - 11/01/19 1358    Clinical  Impression Statement Pt's wife had not made contact with [employer] regarding the pump. Reached out to [place] to discuss if pt had coverage for the pump. The pump should be covered so pt's wife spoke with [employer] to begin the process. Pt's chin swelling seems to be decreasing but he has considerable fibrosis in submental area and anterior neck. Area that had opened and drained at last session has now closed. He saw the doctor about this. The Dr wants to do another CT scan. Pt wants to continue PT at once a week until he is able to get a compression pump to help manage his lymphedema. Fibrosis did soften some today by end of session.    PT Frequency 1x / week    PT Duration 4 weeks    PT Treatment/Interventions ADLs/Self Care Home Management;Therapeutic exercise;Manual techniques;Manual lymph drainage;Compression bandaging;Taping;Passive range of motion;Scar mobilization;Vasopneumatic Device;Therapeutic activities;Neuromuscular re-education    PT Next Visit Plan conitnue MLD for head and neck, assess pt with independence with this, scar massage- sent email to Vicente Males and Lenna Sciara about coverage for pump and garments    PT Home Exercise Plan head and neck ROM exercises,Access Code: [email or web address]    Consulted and Agree with Plan of Care Patient;Family member/caregiver           Patient will benefit from skilled therapeutic intervention in order to improve the following deficits and impairments:  Postural dysfunction, Impaired UE functional use, Decreased range of motion, Decreased knowledge of precautions, Increased edema, Increased fascial restricitons, Decreased strength  Visit Diagnosis: Lymphedema, not elsewhere classified     Problem List Patient Active Problem List   Diagnosis Date Noted  . Diabetes mellitus due to underlying condition with diabetic nephropathy, with long-term current use of insulin ([place]) 09/27/2019  . Malnutrition of moderate degree 08/16/2019  . Hyperkalemia 08/15/2019   . Hyperglycemia 08/15/2019  . SIRS (systemic inflammatory response syndrome) ([place]) 08/15/2019  . AKI (acute kidney injury) ([place]) 08/15/2019  . Oral candidiasis 08/15/2019  . Hypercalcemia 08/15/2019  . 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 Schizophrenia spectrum disorder with psychotic disorder type not yet determined ([place]) 09/24/2015  . Intermittent explosive disorder 09/20/2015  .  Severe recurrent major depressive disorder with psychotic features ([place]) 09/20/2015  . Suicidal ideation 09/20/2015  . Homicidal ideation 09/20/2015  . Chest pain at rest 03/19/2013  . Acute non Q wave MI (myocardial infarction), initial episode of care ([place]) 03/06/2013  . Chronic sciatica of left side 09/10/2012  . Personality disorder ([place]) 11/10/2011  . Alcohol use disorder, mild, abuse 10/01/2011  . NSTEMI (non-ST elevated myocardial infarction) ([place]) 10/01/2011  . Erectile dysfunction 08/01/2010  . Urge incontinence of urine 08/01/2010  . Chronic alcoholic pancreatitis ([place]) 07/24/2010  . Tobacco use disorder 07/24/2010  . Hypertension 07/24/2010  . Hyperlipidemia 07/24/2010  . CVA (cerebral vascular accident) ([place]) 07/24/2010  . Chronic back pain 07/24/2010    Allyson Sabal [facility] 11/01/2019, 2:01 PM  [place], [place], [address] Phone: [phone]   Fax:  [phone]  Name: Solomon Skowronek MRN: [id number] Date of Birth: 23-Jun-1960  Manus Gunning, PT 11/01/19 2:02 PM

## 2019-11-06 ENCOUNTER — Encounter: Payer: Medicare PPO | Admitting: Physical Therapy

## 2019-11-07 ENCOUNTER — Inpatient Hospital Stay: Payer: Medicare PPO | Attending: Radiation Oncology | Admitting: Nutrition

## 2019-11-07 ENCOUNTER — Telehealth: Payer: Self-pay | Admitting: Nutrition

## 2019-11-07 NOTE — Telephone Encounter (Signed)
Nutrition follow up completed with patient and wife over the telephone. They report the tube feeding is going well. He continues to tolerate [employer] 1.4 - 5 times daily with 60 mL water before and after each bolus feeding. He is using 30 mL Prosource BID. This provides 2475 kcal, 130 grams protein, 275 grams CHO. Reports he ate a baked potato last weekend. Recommend he continue TF and protein modular. Advance diet per MD and Speech Therapist. Contact RD for questions or concerns.

## 2019-11-07 NOTE — Progress Notes (Signed)
See telephone note.

## 2019-11-08 ENCOUNTER — Ambulatory Visit: Payer: No Typology Code available for payment source | Admitting: Physical Therapy

## 2019-11-08 ENCOUNTER — Other Ambulatory Visit: Payer: Self-pay

## 2019-11-08 ENCOUNTER — Encounter: Payer: Self-pay | Admitting: Physical Therapy

## 2019-11-08 DIAGNOSIS — M6281 Muscle weakness (generalized): Secondary | ICD-10-CM

## 2019-11-08 DIAGNOSIS — I890 Lymphedema, not elsewhere classified: Secondary | ICD-10-CM | POA: Diagnosis not present

## 2019-11-08 DIAGNOSIS — C040 Malignant neoplasm of anterior floor of mouth: Secondary | ICD-10-CM

## 2019-11-08 DIAGNOSIS — M25612 Stiffness of left shoulder, not elsewhere classified: Secondary | ICD-10-CM

## 2019-11-08 DIAGNOSIS — R293 Abnormal posture: Secondary | ICD-10-CM

## 2019-11-08 NOTE — Therapy (Signed)
[place], [place], [address] Phone: [phone]   Fax:  [phone]  Physical Therapy Treatment  Patient Details  Name: [name] MRN: [id number] Date of Birth: 15-Ramos-1962 Referring Provider (PT): [name] Date: 11/08/2019   PT End of Session - 11/08/19 1354    Visit Number 8    Number of Visits 14    Date for PT Re-Evaluation 12/06/19    PT Start Time 2094    PT Stop Time 1354    PT Time Calculation (min) 51 min    Activity Tolerance Patient tolerated treatment well    Behavior During Therapy [name] for tasks assessed/performed           Past Medical History:  Diagnosis Date  . Anxiety   . Cancer ([place])    mouth  . Chronic pancreatitis ([place])   . Coronary artery disease   . Depression   . Diabetes mellitus without complication ([place])   . Hyperlipidemia   . Hypertension   . Myocardial infarction ([place])   . Neuromuscular disorder ([place])   . Sciatica   . Seizures ([place])   . Stroke ([place])   . Substance abuse [name])     Past Surgical History:  Procedure Laterality Date  . BACK SURGERY    . CORONARY STENT PLACEMENT    . GASTROSTOMY TUBE PLACEMENT Left 07/31/2019   [name]  . LEFT HEART CATHETERIZATION WITH CORONARY ANGIOGRAM N/A 10/01/2011   Procedure: LEFT HEART CATHETERIZATION WITH CORONARY ANGIOGRAM;  Surgeon: Clent Demark, MD;  Location: [name];  Service: Cardiovascular;  Laterality: N/A;  . LEFT HEART CATHETERIZATION WITH CORONARY ANGIOGRAM N/A 03/06/2013   Procedure: LEFT HEART CATHETERIZATION WITH CORONARY ANGIOGRAM;  Surgeon: Clent Demark, MD;  Location: [name];  Service: Cardiovascular;  Laterality: N/A;  . PERCUTANEOUS CORONARY STENT INTERVENTION (PCI-S) N/A 10/02/2011   Procedure: PERCUTANEOUS CORONARY STENT INTERVENTION (PCI-S);  Surgeon: Clent Demark, MD;  Location: [name];  Service: Cardiovascular;  Laterality: N/A;  . TRACHEOSTOMY CLOSURE       There were no vitals filed for this visit.   Subjective Assessment - 11/08/19 1306    Subjective I think the swelling is getting a little better.    Patient Stated Goals to gain all info from head and neck providers    Currently in Pain? No/denies    Pain Score 0-No pain                             [facility] Adult PT Treatment/Exercise - 11/08/19 0001      Manual Therapy   Manual Lymphatic Drainage (MLD) 5 diaphragmatic breaths, short neck, bilateral axillary nodes, bilateral pectoral nodes, short neck, posterior, lateral and anterior neck moving fluid towards pathways then to chin and mandibular areas moving fluid down towards pathways retracing all steps - had pt and spouse return demonstrate each step and gave mod to max v/c for correct direction of stretch and correct technique. Pt and spouse still having difficulty with self MLD                       PT Long Term Goals - 11/08/19 1308      PT LONG TERM GOAL #1   Title Pt will be independent in self MLD for long term management of lymphedema.    Baseline 11/08/19- pt does not feel independent with this  Time 4    Period Weeks    Status On-going      PT LONG TERM GOAL #2   Title Pt will receive appropriate compression garments for long term management of lymphedema    Baseline 11/08/19- pt will be measured at 1130 on sept 3    Time 4    Period Weeks    Status On-going      PT LONG TERM GOAL #3   Title Pt will be independent in a home exercise program for continued strengthening and stretching.    Time 4    Period Weeks    Status Deferred      PT LONG TERM GOAL #4   Title Pt will demonstrate 3+/5 bilateral hip extensor strength to decrease fall risk    Baseline R and L 2+/5    Time 4    Period Weeks    Status Deferred      PT LONG TERM GOAL #5   Title Pt will demosntate 130 degrees of left shoulder abduction to allow him to reach out to the sides    Baseline 74 with sharp pain     Time 4    Period Weeks    Status Deferred                 Plan - 11/08/19 1357    Clinical Impression Statement Pt is waiting to find out if the FlexiTouch pump will be covered. Educated pt about different types of head and neck compression garments and sent demographics sheet to DME company. Pt would like to continue therapy to learn self MLD while he awaits a compression pump.    PT Frequency 1x / week    PT Duration 4 weeks    PT Treatment/Interventions ADLs/Self Care Home Management;Therapeutic exercise;Manual techniques;Manual lymph drainage;Compression bandaging;Taping;Passive range of motion;Scar mobilization;Vasopneumatic Device;Therapeutic activities;Neuromuscular re-education    PT Next Visit Plan conitnue MLD for head and neck, assess pt with independence with this, scar massage- sent email to [name] and [name] about coverage for pump and garments    PT Home Exercise Plan head and neck ROM exercises,Access Code: [email or web address]    Recommended Other Services pt to be measured 9/3 for garment for head and neck    Consulted and Agree with Plan of Care Patient;Family member/caregiver    Family Member Consulted [name]           Patient will benefit from skilled therapeutic intervention in order to improve the following deficits and impairments:  Postural dysfunction, Impaired UE functional use, Decreased range of motion, Decreased knowledge of precautions, Increased edema, Increased fascial restricitons, Decreased strength  Visit Diagnosis: Lymphedema, not elsewhere classified     Problem List Patient Active Problem List   Diagnosis Date Noted  . Diabetes mellitus due to underlying condition with diabetic nephropathy, with long-term current use of insulin ([place]) 09/27/2019  . Malnutrition of moderate degree 08/16/2019  . Hyperkalemia 08/15/2019  . Hyperglycemia 08/15/2019  . SIRS (systemic inflammatory response syndrome) ([place]) 08/15/2019  . AKI (acute kidney injury)  ([place]) 08/15/2019  . Oral candidiasis 08/15/2019  . Hypercalcemia 08/15/2019  . 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 Schizophrenia spectrum disorder with psychotic disorder type not yet determined ([place]) 09/24/2015  . Intermittent explosive disorder 09/20/2015  . Severe recurrent major depressive disorder with psychotic features ([place]) 09/20/2015  . Suicidal ideation 09/20/2015  . Homicidal ideation 09/20/2015  . Chest pain at rest 03/19/2013  . Acute non  Q wave MI (myocardial infarction), initial episode of care ([place]) 03/06/2013  . Chronic sciatica of left side 09/10/2012  . Personality disorder ([place]) 11/10/2011  . Alcohol use disorder, mild, abuse 10/01/2011  . NSTEMI (non-Willie elevated myocardial infarction) ([place]) 10/01/2011  . Erectile dysfunction 08/01/2010  . Urge incontinence of urine 08/01/2010  . Chronic alcoholic pancreatitis ([place]) 07/24/2010  . Tobacco use disorder 07/24/2010  . Hypertension 07/24/2010  . Hyperlipidemia 07/24/2010  . CVA (cerebral vascular accident) ([place]) 07/24/2010  . Chronic back pain 07/24/2010    Allyson Sabal [facility] 11/08/2019, 2:02 PM  [place], [place], [address] Phone: [phone]   Fax:  [phone]  Name: [name] MRN: [id number] Date of Birth: 1961/03/05  Manus Gunning, PT 11/08/19 2:02 PM

## 2019-11-13 ENCOUNTER — Encounter: Payer: Self-pay | Admitting: Physical Therapy

## 2019-11-13 ENCOUNTER — Other Ambulatory Visit: Payer: Self-pay

## 2019-11-13 ENCOUNTER — Ambulatory Visit: Payer: No Typology Code available for payment source | Admitting: Physical Therapy

## 2019-11-13 DIAGNOSIS — R293 Abnormal posture: Secondary | ICD-10-CM

## 2019-11-13 DIAGNOSIS — I890 Lymphedema, not elsewhere classified: Secondary | ICD-10-CM | POA: Diagnosis not present

## 2019-11-13 NOTE — Therapy (Signed)
[place], [place], [address] Phone: [phone]   Fax:  [phone]  Physical Therapy Treatment  Patient Details  Name: [name] MRN: [id number] Date of Birth: 06-04-1960 Referring Provider (PT): Reita May Date: 11/13/2019   PT End of Session - 11/13/19 1355    Visit Number 9    Number of Visits 14    Date for PT Re-Evaluation 12/06/19    PT Start Time 1303    PT Stop Time 1351    PT Time Calculation (min) 48 min    Activity Tolerance Patient tolerated treatment well    Behavior During Therapy [facility] for tasks assessed/performed           Past Medical History:  Diagnosis Date   Anxiety    Cancer ([place])    mouth   Chronic pancreatitis ([place])    Coronary artery disease    Depression    Diabetes mellitus without complication ([place])    Hyperlipidemia    Hypertension    Myocardial infarction ([place])    Neuromuscular disorder ([place])    Sciatica    Seizures ([place])    Stroke ([place])    Substance abuse ([place])     Past Surgical History:  Procedure Laterality Date   BACK SURGERY     CORONARY STENT PLACEMENT     GASTROSTOMY TUBE PLACEMENT Left 07/31/2019   [facility]   LEFT HEART CATHETERIZATION WITH CORONARY ANGIOGRAM N/A 10/01/2011   Procedure: LEFT HEART CATHETERIZATION WITH CORONARY ANGIOGRAM;  Surgeon: Clent Demark, MD;  Location: [facility] CATH LAB;  Service: Cardiovascular;  Laterality: N/A;   LEFT HEART CATHETERIZATION WITH CORONARY ANGIOGRAM N/A 03/06/2013   Procedure: LEFT HEART CATHETERIZATION WITH CORONARY ANGIOGRAM;  Surgeon: Clent Demark, MD;  Location: [facility] CATH LAB;  Service: Cardiovascular;  Laterality: N/A;   PERCUTANEOUS CORONARY STENT INTERVENTION (PCI-S) N/A 10/02/2011   Procedure: PERCUTANEOUS CORONARY STENT INTERVENTION (PCI-S);  Surgeon: Clent Demark, MD;  Location: [facility] CATH LAB;  Service: Cardiovascular;  Laterality: N/A;   TRACHEOSTOMY CLOSURE       There were no vitals filed for this visit.   Subjective Assessment - 11/13/19 1308    Subjective The swelling seems to have moved down the front of my neck. I think my whole neck is more swollen.    Patient Stated Goals to gain all info from head and neck providers    Currently in Pain? No/denies    Pain Score 0-No pain                 LYMPHEDEMA/ONCOLOGY QUESTIONNAIRE - 11/13/19 0001      Head and Neck   4 cm superior to sternal notch around neck 38.7 cm    6 cm superior to sternal notch around neck 37.9 cm    8 cm superior to sternal notch around neck 38 cm                      [facility] Adult PT Treatment/Exercise - 11/13/19 0001      Manual Therapy   Soft tissue mobilization to upper traps, levator, scalenes, rhomboids due to pain and discomfort at end of MLD in neck area- nuermous areas of muscle tightness noted- reissued head and neck stretches and encouraged pt to do these at home- also performed manual pec stretch bilaterally x 2 each    Manual Lymphatic Drainage (MLD) 5 diaphragmatic breaths, short neck, bilateral axillary nodes, bilateral pectoral nodes, short  neck, posterior, lateral and anterior neck moving fluid towards pathways - pt return demonstrated technique for first half of session, he required less verbal cues today and demonstrated a better skin stretch technique, educated pt on importance of not gliding over the skin, therapist performed second half of session                       PT Long Term Goals - 11/08/19 1308      PT LONG TERM GOAL #1   Title Pt will be independent in self MLD for long term management of lymphedema.    Baseline 11/08/19- pt does not feel independent with this    Time 4    Period Weeks    Status On-going      PT LONG TERM GOAL #2   Title Pt will receive appropriate compression garments for long term management of lymphedema    Baseline 11/08/19- pt will be measured at 1130 on sept 3    Time 4     Period Weeks    Status On-going      PT LONG TERM GOAL #3   Title Pt will be independent in a home exercise program for continued strengthening and stretching.    Time 4    Period Weeks    Status Deferred      PT LONG TERM GOAL #4   Title Pt will demonstrate 3+/5 bilateral hip extensor strength to decrease fall risk    Baseline R and L 2+/5    Time 4    Period Weeks    Status Deferred      PT LONG TERM GOAL #5   Title Pt will demosntate 130 degrees of left shoulder abduction to allow him to reach out to the sides    Baseline 74 with sharp pain    Time 4    Period Weeks    Status Deferred                 Plan - 11/13/19 1356    Clinical Impression Statement Continued with MLD today and having pt return demonstrate so he can be independent with self MLD. Pt required few verbal cues today. Therapist completed MLD but pt began having pain in his neck towards the end and had to sit on edge of mat. Performed soft tissue mobilization to upper back and neck musculature to help reduce pain which pt stated helped. Pt had numerous areas of tightness and discomfort and was educated on importance on doing neck stretches daily. Reissued neck ROM exercises to pt and encouraged him to do them daily.    PT Frequency 1x / week    PT Duration 4 weeks    PT Treatment/Interventions ADLs/Self Care Home Management;Therapeutic exercise;Manual techniques;Manual lymph drainage;Compression bandaging;Taping;Passive range of motion;Scar mobilization;Vasopneumatic Device;Therapeutic activities;Neuromuscular re-education    PT Next Visit Plan conitnue MLD for head and neck, assess pt with independence with this, scar massage- sent email to Vicente Males and Lenna Sciara about coverage for pump and garments    PT Home Exercise Plan head and neck ROM exercises,Access Code: [email or web address]    Consulted and Agree with Plan of Care Patient;Family member/caregiver           Patient will benefit from skilled therapeutic  intervention in order to improve the following deficits and impairments:  Postural dysfunction, Impaired UE functional use, Decreased range of motion, Decreased knowledge of precautions, Increased edema, Increased fascial restricitons, Decreased strength  Visit Diagnosis: Lymphedema, not elsewhere  classified  Abnormal posture     Problem List Patient Active Problem List   Diagnosis Date Noted   Diabetes mellitus due to underlying condition with diabetic nephropathy, with long-term current use of insulin ([place]) 09/27/2019   Malnutrition of moderate degree 08/16/2019   Hyperkalemia 08/15/2019   Hyperglycemia 08/15/2019   SIRS (systemic inflammatory response syndrome) ([place]) 08/15/2019   AKI (acute kidney injury) ([place]) 08/15/2019   Oral candidiasis 08/15/2019   Hypercalcemia 08/15/2019   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 ([place]) 08/08/2019   Schizophrenia spectrum disorder with psychotic disorder type not yet determined ([place]) 09/24/2015   Intermittent explosive disorder 09/20/2015   Severe recurrent major depressive disorder with psychotic features ([place]) 09/20/2015   Suicidal ideation 09/20/2015   Homicidal ideation 09/20/2015   Chest pain at rest 03/19/2013   Acute non Q wave MI (myocardial infarction), initial episode of care ([place]) 03/06/2013   Chronic sciatica of left side 09/10/2012   Personality disorder ([place]) 11/10/2011   Alcohol use disorder, mild, abuse 10/01/2011   NSTEMI (non-ST elevated myocardial infarction) ([place]) 10/01/2011   Erectile dysfunction 08/01/2010   Urge incontinence of urine 08/01/2010   Chronic alcoholic pancreatitis ([place]) 07/24/2010   Tobacco use disorder 07/24/2010   Hypertension 07/24/2010   Hyperlipidemia 07/24/2010   CVA (cerebral vascular accident) ([place]) 07/24/2010   Chronic back pain 07/24/2010    Allyson Sabal [facility] 11/13/2019, 1:58 PM  [place], [place], [address] Phone: [phone]   Fax:  [phone]  Name: [name] MRN: [id number] Date of Birth: 1960-10-02  Manus Gunning, PT 11/13/19 1:59 PM

## 2019-11-15 NOTE — Progress Notes (Signed)
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   Plan: The patient will follow-up with radiation oncology in 2 weeks.  -----------------------------------  Eppie Gibson, MD

## 2019-11-24 ENCOUNTER — Telehealth: Payer: Self-pay

## 2019-11-24 NOTE — Telephone Encounter (Signed)
Received VM from patient's wife stating that patient would like an appointment with Dr. Isidore Moos next week to address an issue with his mouth/jaw. I could hear Mr. Ancrum in the background saying "I have an abscess". Relayed information to Dr. Isidore Moos who advised that patient should reach out to either Dr. Kandace Parkins or Dr. Weston Anna since they are ENT providers and would be able to take a biopsy or do a more thorough investigation into the issue.   Returned wife's call to relay above information. Wife stated that she is back at work, and they currently only have one car so it is difficult for her to get him to appointments out of town. Reiterated that even if patient came to see Dr. Isidore Moos here in [place], Dr. Isidore Moos would most likely have to refer patient back to his ENT which would be a delay in care. Encouraged wife to call Dr. Lavonia Dana and Dr. Eugenia Pancoast office to see which could see patient sooner (and informed her that we would also be reaching out the their offices to see if we could expedite the process). Advised patient and wife to continue to monitor area for any redness, heat, fever, or other signs of infection. They both verbalized understanding and agreement. Wife denied any other needs at this time, but I encouraged her to call our office should they have any other questions/concerns.

## 2019-11-29 ENCOUNTER — Encounter: Payer: Self-pay | Admitting: Physical Therapy

## 2019-11-29 ENCOUNTER — Ambulatory Visit: Payer: No Typology Code available for payment source | Attending: Radiation Oncology | Admitting: Physical Therapy

## 2019-11-29 ENCOUNTER — Other Ambulatory Visit: Payer: Self-pay

## 2019-11-29 DIAGNOSIS — R4781 Slurred speech: Secondary | ICD-10-CM | POA: Insufficient documentation

## 2019-11-29 DIAGNOSIS — R1312 Dysphagia, oropharyngeal phase: Secondary | ICD-10-CM | POA: Diagnosis present

## 2019-11-29 DIAGNOSIS — I890 Lymphedema, not elsewhere classified: Secondary | ICD-10-CM | POA: Insufficient documentation

## 2019-11-29 DIAGNOSIS — R293 Abnormal posture: Secondary | ICD-10-CM

## 2019-11-29 DIAGNOSIS — C040 Malignant neoplasm of anterior floor of mouth: Secondary | ICD-10-CM | POA: Insufficient documentation

## 2019-11-29 DIAGNOSIS — M25612 Stiffness of left shoulder, not elsewhere classified: Secondary | ICD-10-CM | POA: Insufficient documentation

## 2019-11-29 NOTE — Therapy (Signed)
[place], [place], [address] Phone: [phone]   Fax:  [phone]  Physical Therapy Treatment  Patient Details  Name: [name] MRN: [id number] Date of Birth: 20-Sep-1960 Referring Provider (PT): Reita May Date: 11/29/2019   PT End of Session - 11/29/19 7654    Visit Number 10    Number of Visits 14    Date for PT Re-Evaluation 12/06/19    PT Start Time 1303    PT Stop Time 1357    PT Time Calculation (min) 54 min    Activity Tolerance Patient tolerated treatment well    Behavior During Therapy [facility] for tasks assessed/performed           Past Medical History:  Diagnosis Date   Anxiety    Cancer ([place])    mouth   Chronic pancreatitis ([place])    Coronary artery disease    Depression    Diabetes mellitus without complication ([place])    Hyperlipidemia    Hypertension    Myocardial infarction ([name])    Neuromuscular disorder ([place])    Sciatica    Seizures ([place])    Stroke ([place])    Substance abuse ([place])     Past Surgical History:  Procedure Laterality Date   BACK SURGERY     CORONARY STENT PLACEMENT     GASTROSTOMY TUBE PLACEMENT Left 07/31/2019   [facility]   LEFT HEART CATHETERIZATION WITH CORONARY ANGIOGRAM N/A 10/01/2011   Procedure: LEFT HEART CATHETERIZATION WITH CORONARY ANGIOGRAM;  Surgeon: Clent Demark, MD;  Location: [facility] CATH LAB;  Service: Cardiovascular;  Laterality: N/A;   LEFT HEART CATHETERIZATION WITH CORONARY ANGIOGRAM N/A 03/06/2013   Procedure: LEFT HEART CATHETERIZATION WITH CORONARY ANGIOGRAM;  Surgeon: Clent Demark, MD;  Location: [facility] CATH LAB;  Service: Cardiovascular;  Laterality: N/A;   PERCUTANEOUS CORONARY STENT INTERVENTION (PCI-S) N/A 10/02/2011   Procedure: PERCUTANEOUS CORONARY STENT INTERVENTION (PCI-S);  Surgeon: Clent Demark, MD;  Location: [facility] C[facility] LAB;  Service: Cardiovascular;  Laterality: N/A;   TRACHEOSTOMY CLOSURE       There were no vitals filed for this visit.   Subjective Assessment - 11/29/19 1310    Subjective The swelling went down. I have not gotten the results back from my PET scan. I go tomorrow to see the doctor.    Patient Stated Goals to gain all info from head and neck providers    Currently in Pain? No/denies    Pain Score 0-No pain                 LYMPHEDEMA/ONCOLOGY QUESTIONNAIRE - 11/29/19 0001      Head and Neck   4 cm superior to sternal notch around neck 39.1 cm    6 cm superior to sternal notch around neck 38.7 cm    8 cm superior to sternal notch around neck 38.8 cm                      [facility] Adult PT Treatment/Exercise - 11/29/19 0001      Manual Therapy   Soft tissue mobilization in left sidelying to right upper traps, cervical muscles, rhomboids, scalenes, levator and then same to opposite side- numerous areas of muscle tightness noted bilaterally with "rope like" texture palpable- this eased with treatment today and pt stated his neck felt more loose  PT Long Term Goals - 11/08/19 1308      PT LONG TERM GOAL #1   Title Pt will be independent in self MLD for long term management of lymphedema.    Baseline 11/08/19- pt does not feel independent with this    Time 4    Period Weeks    Status On-going      PT LONG TERM GOAL #2   Title Pt will receive appropriate compression garments for long term management of lymphedema    Baseline 11/08/19- pt will be measured at 1130 on sept 3    Time 4    Period Weeks    Status On-going      PT LONG TERM GOAL #3   Title Pt will be independent in a home exercise program for continued strengthening and stretching.    Time 4    Period Weeks    Status Deferred      PT LONG TERM GOAL #4   Title Pt will demonstrate 3+/5 bilateral hip extensor strength to decrease fall risk    Baseline R and L 2+/5    Time 4    Period Weeks    Status Deferred      PT LONG TERM GOAL #5    Title Pt will demosntate 130 degrees of left shoulder abduction to allow him to reach out to the sides    Baseline 74 with sharp pain    Time 4    Period Weeks    Status Deferred                 Plan - 11/29/19 1358    Clinical Impression Statement Pt states his swelling has gone done some. He reports the soft tissue mobilization to his neck helped reduce pain last session and he wanted to focus on this again today. Spent session performing soft tissue mobilization to bilateral neck and upper back. Numerous areas of tightness palpable that did relax today with treatment. Pt signed up to be measured of Friday for head and neck garment.    PT Frequency 1x / week    PT Duration 4 weeks    PT Treatment/Interventions ADLs/Self Care Home Management;Therapeutic exercise;Manual techniques;Manual lymph drainage;Compression bandaging;Taping;Passive range of motion;Scar mobilization;Vasopneumatic Device;Therapeutic activities;Neuromuscular re-education    PT Next Visit Plan conitnue MLD for head and neck, assess pt with independence with this, scar massage- sent email to Vicente Males and Lenna Sciara about coverage for pump and garments    PT Home Exercise Plan head and neck ROM exercises,Access Code: [email or web address]    Consulted and Agree with Plan of Care Patient           Patient will benefit from skilled therapeutic intervention in order to improve the following deficits and impairments:  Postural dysfunction, Impaired UE functional use, Decreased range of motion, Decreased knowledge of precautions, Increased edema, Increased fascial restricitons, Decreased strength  Visit Diagnosis: Abnormal posture  Stiffness of left shoulder, not elsewhere classified     Problem List Patient Active Problem List   Diagnosis Date Noted   Diabetes mellitus due to underlying condition with diabetic nephropathy, with long-term current use of insulin ([place]) 09/27/2019   Malnutrition of moderate degree 08/16/2019    Hyperkalemia 08/15/2019   Hyperglycemia 08/15/2019   SIRS (systemic inflammatory response syndrome) ([place]) 08/15/2019   AKI (acute kidney injury) ([place]) 08/15/2019   Oral candidiasis 08/15/2019   Hypercalcemia 08/15/2019   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 ([place]) 08/08/2019   Schizophrenia spectrum disorder  with psychotic disorder type not yet determined ([place]) 09/24/2015   Intermittent explosive disorder 09/20/2015   Severe recurrent major depressive disorder with psychotic features ([place]) 09/20/2015   Suicidal ideation 09/20/2015   Homicidal ideation 09/20/2015   Chest pain at rest 03/19/2013   Acute non Q wave MI (myocardial infarction), initial episode of care ([place]) 03/06/2013   Chronic sciatica of left side 09/10/2012   Personality disorder ([place]) 11/10/2011   Alcohol use disorder, mild, abuse 10/01/2011   NSTEMI (non-ST elevated myocardial infarction) ([place]) 10/01/2011   Erectile dysfunction 08/01/2010   Urge incontinence of urine 08/01/2010   Chronic alcoholic pancreatitis ([place]) 07/24/2010   Tobacco use disorder 07/24/2010   Hypertension 07/24/2010   Hyperlipidemia 07/24/2010   CVA (cerebral vascular accident) ([place]) 07/24/2010   Chronic back pain 07/24/2010    Allyson Sabal [facility] 11/29/2019, 2:00 PM  [place], [place], [address] Phone: [phone]   Fax:  [phone]  Name: [name] MRN: [id number] Date of Birth: [birth year]  Manus Gunning, PT 11/29/19 2:00 PM

## 2019-12-06 ENCOUNTER — Ambulatory Visit: Payer: No Typology Code available for payment source | Admitting: Physical Therapy

## 2019-12-07 ENCOUNTER — Telehealth: Payer: Self-pay

## 2019-12-07 ENCOUNTER — Telehealth: Payer: Self-pay | Admitting: *Deleted

## 2019-12-07 NOTE — Progress Notes (Signed)
Pain issues, if any: Patient reports a sore throat (5 out of 10 pain). Reports it is constant and also feels like his throat is slightly swollen, and woke him up last night because he felt like he was choking Using a feeding tube?: Yes. Reports using 5x daily without any issue.  Weight changes, if any:  Wt Readings from Last 3 Encounters:  12/08/19 135 lb 9.6 oz (61.5 kg)  10/11/19 131 lb (59.4 kg)  09/27/19 127 lb (57.6 kg)   Swallowing issues, if any: Reports he is able to take small sips of liquid and soft foods (but still predominately uses PEG tube for nutrition) Smoking or chewing tobacco? None Using fluoride trays daily? N/A Last ENT visit was on: 11/30/2019 Saw. Dr. Harriett Rush [place]:  "No concerning masses noted on exam 1. NED - SLP visit on 10/2019 at [facility] - diet per SLP recs - continue PT for shoulder - 12 week PET scan in oct. Working on [employer].  - follow up in Oct/2021 for 12 week visit 2. Neck drainage  - asked him to call the office if he has recurrence"  Other notable issues, if any: Still dealing with residual swelling under jaw/neck. Skin in treatment field appears well healed. Reports occasional sharp pain inside his right ear. Denies any difficulty opening/closing his mouth. Has an appointment to see Marin Olp later this afternoon

## 2019-12-07 NOTE — Telephone Encounter (Signed)
CALLED PATIENT TO ASK ABOUT COMING FOR FU APPT. TOMORROW, PATIENT AGREED TO COME IN @ 10:20 AM ON 12-08-19

## 2019-12-07 NOTE — Telephone Encounter (Signed)
Received VM from patient's wife [name] asking if patient could be evaluated by Dr. Isidore Moos. She stated that [name]'s throat was sore and seemed swollen. Patient saw Dr. Harriett Rush Patwa-ENT last week about swelling in his jaw, but does not have another F/U appointment with him until October. Information passed along to Dr. Isidore Moos who approved to evaluate patient tomorrow for a morning F/U. Scheduling message sent with instructions to call patient/wife with update.

## 2019-12-08 ENCOUNTER — Other Ambulatory Visit: Payer: Self-pay

## 2019-12-08 ENCOUNTER — Ambulatory Visit
Admission: RE | Admit: 2019-12-08 | Discharge: 2019-12-08 | Disposition: A | Discharge status: Home / Self Care | Payer: No Typology Code available for payment source | Source: Ambulatory Visit | Attending: Radiation Oncology | Admitting: Radiation Oncology

## 2019-12-08 ENCOUNTER — Ambulatory Visit: Payer: No Typology Code available for payment source

## 2019-12-08 VITALS — BP 118/76 | HR 96 | Temp 97.9°F | Resp 20 | Ht 70.0 in | Wt 135.6 lb

## 2019-12-08 DIAGNOSIS — R1312 Dysphagia, oropharyngeal phase: Secondary | ICD-10-CM

## 2019-12-08 DIAGNOSIS — Z79899 Other long term (current) drug therapy: Secondary | ICD-10-CM | POA: Insufficient documentation

## 2019-12-08 DIAGNOSIS — R293 Abnormal posture: Secondary | ICD-10-CM | POA: Diagnosis not present

## 2019-12-08 DIAGNOSIS — Z7982 Long term (current) use of aspirin: Secondary | ICD-10-CM | POA: Insufficient documentation

## 2019-12-08 DIAGNOSIS — Z85819 Personal history of malignant neoplasm of unspecified site of lip, oral cavity, and pharynx: Secondary | ICD-10-CM | POA: Insufficient documentation

## 2019-12-08 DIAGNOSIS — R221 Localized swelling, mass and lump, neck: Secondary | ICD-10-CM | POA: Diagnosis not present

## 2019-12-08 DIAGNOSIS — Z08 Encounter for follow-up examination after completed treatment for malignant neoplasm: Secondary | ICD-10-CM | POA: Diagnosis not present

## 2019-12-08 DIAGNOSIS — C040 Malignant neoplasm of anterior floor of mouth: Secondary | ICD-10-CM

## 2019-12-08 DIAGNOSIS — Z794 Long term (current) use of insulin: Secondary | ICD-10-CM | POA: Insufficient documentation

## 2019-12-08 DIAGNOSIS — J029 Acute pharyngitis, unspecified: Secondary | ICD-10-CM | POA: Insufficient documentation

## 2019-12-08 DIAGNOSIS — Z923 Personal history of irradiation: Secondary | ICD-10-CM | POA: Insufficient documentation

## 2019-12-08 DIAGNOSIS — Z85818 Personal history of malignant neoplasm of other sites of lip, oral cavity, and pharynx: Secondary | ICD-10-CM | POA: Diagnosis not present

## 2019-12-08 DIAGNOSIS — I890 Lymphedema, not elsewhere classified: Secondary | ICD-10-CM | POA: Insufficient documentation

## 2019-12-08 DIAGNOSIS — R4781 Slurred speech: Secondary | ICD-10-CM

## 2019-12-08 NOTE — Therapy (Signed)
[facility] [address] [place], [place], [address] Phone: [phone]   Fax:  [phone]  Speech Language Pathology Treatment  Patient Details  Name: [name] MRN: [id number] Date of Birth: [birth year] Referring Provider (SLP): Eppie Gibson, MD   Encounter Date: 12/08/2019   End of Session - 12/08/19 1657    Visit Number 3    Number of Visits 7    Date for SLP Re-Evaluation 02/23/20   12 weeks   SLP Start Time 1319    SLP Stop Time  4765    SLP Time Calculation (min) 43 min    Activity Tolerance Patient tolerated treatment well           Past Medical History:  Diagnosis Date  . Anxiety   . Cancer ([place])    mouth  . Chronic pancreatitis ([place])   . Coronary artery disease   . Depression   . Diabetes mellitus without complication ([place])   . Hyperlipidemia   . Hypertension   . Myocardial infarction ([place])   . Neuromuscular disorder ([place])   . Sciatica   . Seizures ([place])   . Stroke ([place])   . Substance abuse [facility])     Past Surgical History:  Procedure Laterality Date  . BACK SURGERY    . CORONARY STENT PLACEMENT    . GASTROSTOMY TUBE PLACEMENT Left 07/31/2019   [facility]  . LEFT HEART CATHETERIZATION WITH CORONARY ANGIOGRAM N/A 10/01/2011   Procedure: LEFT HEART CATHETERIZATION WITH CORONARY ANGIOGRAM;  Surgeon: Clent Demark, MD;  Location: [facility] CATH LAB;  Service: Cardiovascular;  Laterality: N/A;  . LEFT HEART CATHETERIZATION WITH CORONARY ANGIOGRAM N/A 03/06/2013   Procedure: LEFT HEART CATHETERIZATION WITH CORONARY ANGIOGRAM;  Surgeon: Clent Demark, MD;  Location: [facility] CATH LAB;  Service: Cardiovascular;  Laterality: N/A;  . PERCUTANEOUS CORONARY STENT INTERVENTION (PCI-S) N/A 10/02/2011   Procedure: PERCUTANEOUS CORONARY STENT INTERVENTION (PCI-S);  Surgeon: Clent Demark, MD;  Location: [facility] CATH LAB;  Service: Cardiovascular;  Laterality: N/A;  . TRACHEOSTOMY CLOSURE      There were no  vitals filed for this visit.   Subjective Assessment - 12/08/19 1314    Subjective --                 ADULT SLP TREATMENT - 12/08/19 1643      General Information   Behavior/Cognition Alert;Cooperative      Treatment Provided   Treatment provided Dysphagia      Dysphagia Treatment   Temperature Spikes Noted No    Respiratory Status Room air    Oral Cavity - Dentition Missing dentition    Treatment Methods Skilled observation;Therapeutic exercise;Patient/caregiver education;Compensation strategy training    Patient observed directly with PO's Yes    Type of PO's observed Dysphagia 1 (puree);Thin liquids    Liquids provided via Cup    Oral Phase Signs & Symptoms Prolonged bolus formation    Pharyngeal Phase Signs & Symptoms Multiple swallows;Complaints of residue;Audible swallow    Type of cueing Verbal;Visual    Amount of cueing --   occasional faded to rare   Other treatment/comments [name] reports he would rather have tube feed to PO due to disliking pureed, adn not having teeth to chew. SLP told pt we have seen he is safest with pureed at this point and if he had teeth he well might not be safe with food he could masticate. Pt reports he has something PO each day - yesterday it was a  couple bites of cookie he let "melt" in his mouth snd then he swallowed. SLP educated pt (re-educated) on late effects of head/neck radiation on swallow function. Pt to decide if he wants to use PEG or do POs to gain strength in order to give him best opportunity to regain some swallow strength and possibly have something OTHER THAN puree, which he does not like. SLP explained pt's POs are not primarily for pleasure right now but for regaining swallow strength. SLP trained pt in use of copmensatory strategies from FEES in August 2021- small bites/sips, alternate bite/sip, fully swallow prior to inserting next bolus, multiple swallows. Occasional min cues for multiple swallows (fully swalow) and to  alternate bite/sip, which was faded to independent by second of three solid/liquid reps. SLP reminded pt of muscle fibrosis as a danger of not completing HEP and reviewed HEP with pt (he has not/had not completed this). SLP told pt to do two-three-four reps of each swallow exercise and put reps of non-swallowing exercise (pitch raise) between them to give pt more stamina.       Assessment / Recommendations / Plan   Plan Continue with current plan of care      Progression Toward Goals   Progression toward goals Progressing toward goals            SLP Education - 12/08/19 1656    Education Details late effects (atrophy, fibrosis), HEP procedure adn how to structure HEP (swallow-nonswallow-swallow, etc), rationale for puree POs not for pleasure but for function at tihs time, oral care after POs    Person(s) Educated Patient    Methods Explanation;Demonstration;Verbal cues;Handout    Comprehension Verbalized understanding;Returned demonstration;Verbal cues required;Need further instruction            SLP Short Term Goals - 12/08/19 1700      SLP SHORT TERM GOAL #1   Title pt will complete HEP with occasional min A    Time 2   goals renewed 12-07-19   Period --   sessions, for all STGs   Status On-going      SLP SHORT TERM GOAL #2   Title pt will follow swallow precautions with POs with rare min A    Time 2    Status On-going      SLP SHORT TERM GOAL #3   Title pt will tell SLP why oral hygiene is important    Time 2    Status Deferred      SLP SHORT TERM GOAL #4   Title pt will tell SLP why he is doing exercises, and why it is important to have daily PO in two sessions, with modified independence    Time 2    Status On-going            SLP Long Term Goals - 12/08/19 1701      SLP LONG TERM GOAL #1   Title pt will tell SLP why he is doing exercises, and why it is important to have daily PO in four sessions, with modified independence    Time 5   goals renewed 12-07-19    Period --   sessions, for all LTGs   Status On-going      SLP LONG TERM GOAL #2   Title pt will complete HEP with modifeid independence in 2 sessions    Time 4    Status On-going      SLP LONG TERM GOAL #3   Title pt will tell SLP 3 overt  s/sx aspiration PNA with modified independence over 2 sessions    Time 5    Status On-going      SLP LONG TERM GOAL #4   Title pt will tell SLP when to reduce frequency of HEP    Time 6    Status On-going            Plan - 12/08/19 1658    Clinical Impression Statement Pt had FEES at [facility] with Thomasene Mohair, SLP in August 2021. See "care everywhere" for details - puree with thin recommended. Pt remains PEG dependent since Jul 31, 2019. Amon cont to endorse daily minimal PO. See "other comments" for details. Data indicate that pt's swallow ability could very well decline over time following conclusion of his radiation therapy due to muscle disuse atrophy and/or muscle fibrosis. Pt will cont to need to be seen by SLP in order to assess safety of PO intake, assess the need for recommending any objective swallow assessment, and ensuring pt correctly completes the individualized HEP. Pt continues extremely difficult to understand due to surgical changes to oral and lingual structures.    Speech Therapy Frequency --   once approx every 4 weeks   Duration --   6 visits; 7 total sessions   Treatment/Interventions Aspiration precaution training;Pharyngeal strengthening exercises;Diet toleration management by SLP;Trials of upgraded texture/liquids;Internal/external aids;Patient/family education;SLP instruction and feedback    Potential to Achieve Goals Fair    Potential Considerations Severity of impairments    SLP Home Exercise Plan provided today    Consulted and Agree with Plan of Care Patient           Patient will benefit from skilled therapeutic intervention in order to improve the following deficits and impairments:   Dysphagia, oropharyngeal  phase - Plan: SLP plan of care cert/re-cert  Slurred speech - Plan: SLP plan of care cert/re-cert    Problem List Patient Active Problem List   Diagnosis Date Noted  . Diabetes mellitus due to underlying condition with diabetic nephropathy, with long-term current use of insulin ([place]) 09/27/2019  . Malnutrition of moderate degree 08/16/2019  . Hyperkalemia 08/15/2019  . Hyperglycemia 08/15/2019  . SIRS (systemic inflammatory response syndrome) ([place]) 08/15/2019  . AKI (acute kidney injury) ([place]) 08/15/2019  . Oral candidiasis 08/15/2019  . Hypercalcemia 08/15/2019  . 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 Schizophrenia spectrum disorder with psychotic disorder type not yet determined ([place]) 09/24/2015  . Intermittent explosive disorder 09/20/2015  . Severe recurrent major depressive disorder with psychotic features ([place]) 09/20/2015  . Suicidal ideation 09/20/2015  . Homicidal ideation 09/20/2015  . Chest pain at rest 03/19/2013  . Acute non Q wave MI (myocardial infarction), initial episode of care ([place]) 03/06/2013  . Chronic sciatica of left side 09/10/2012  . Personality disorder ([place]) 11/10/2011  . Alcohol use disorder, mild, abuse 10/01/2011  . NSTEMI (non-ST elevated myocardial infarction) ([place]) 10/01/2011  . Erectile dysfunction 08/01/2010  . Urge incontinence of urine 08/01/2010  . Chronic alcoholic pancreatitis ([place]) 07/24/2010  . Tobacco use disorder 07/24/2010  . Hypertension 07/24/2010  . Hyperlipidemia 07/24/2010  . CVA (cerebral vascular accident) ([place]) 07/24/2010  . Chronic back pain 07/24/2010    [facility] 12/08/2019, 5:04 PM  [facility] [address] [place], [place], [address] Phone: [phone]   Fax:  [phone]   Name: [name] MRN: [id number] Date of Birth: Sep 25, 1960

## 2019-12-08 NOTE — Patient Instructions (Addendum)
   Eat Pureed foods, and drink regular liquids- follow these things below when you eat and drink:  -Take smaller bites and smaller sips  -Eat at a slower pace  -Alternate food and liquid - back and forth  -Don't mix food and liquids - swallow a few times before you put the next thing in your mouth  -clean out your mouth after you have something by mouth ==========================================================  Do a couple hard swallows, then a few "Heeeeee", then one or two "swallow and squeeze" (#5) - and repeat this as many times as you can, as many times a day as you can

## 2019-12-08 NOTE — Progress Notes (Signed)
Oncology Nurse Navigator Documentation  I met with [name] during his follow up with Dr. Isidore Moos today. He last saw Dr. Hendricks Limes on 11/30/19, and Dr. Hendricks Limes plans to see him again in October 2021 after a PET scan is completed. He will see Dr. Isidore Moos again on December 3rd. He will see Garald Balding SLP today, and Allyson Sabal PT on 9/29. He knows to call us if he has any further needs or concerns.  Harlow Asa RN, BSN, OCN Head & Neck Oncology Nurse [place] at [facility] Phone # [phone]  Fax # [phone]

## 2019-12-12 ENCOUNTER — Encounter: Payer: Self-pay | Admitting: Radiation Oncology

## 2019-12-12 NOTE — Progress Notes (Signed)
Radiation Oncology         [phone] ________________________________  Name: [name] MRN: [id number]  Date: 12/08/2019  DOB: [birth year]  Follow-Up Visit Note  CC: Clinic, [facility] Neomia Dear, MD  Diagnosis and Prior Radiotherapy:       ICD-10-CM   1. 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  He saw otolaryngology earlier this month and was found to have no evidence of disease.  The patient reports a sore throat (5 out of 10 pain). Reports it is constant and also feels like his throat is slightly swollen, and woke him up last night because he felt like he was choking.  Overall however, he reports that he has felt a little bit better over the past couple of months.  He is gaining weight and his secretions have decreased in his mouth.  He still has some lymphedema under his jaw and neck.  His skin has healed well.  He denies any significant trismus.  He has speech-language pathology appointment later today.  Using a feeding tube?: Yes. Reports using 5x daily without any issue.  Weight changes, if any: He is gaining weight Wt Readings from Last 3 Encounters:  12/08/19 135 lb 9.6 oz (61.5 kg)  10/11/19 131 lb (59.4 kg)  09/27/19 127 lb (57.6 kg)   Swallowing issues, if any: Reports he is able to take small sips of liquid and soft foods (but still predominately uses PEG tube for nutrition) Smoking or chewing tobacco? None Using fluoride trays daily? N/A Last ENT visit was on: 11/30/2019 Saw. Dr. Harriett Rush [place]:  "No concerning masses noted on exam 1. NED - SLP visit on 10/2019 at [facility] - diet per SLP recs - continue PT for shoulder - 12 week PET scan in oct. Working on [employer].  - follow up in Oct/2021 for 12 week visit 2. Neck drainage  - asked him to call the  office if he has recurrence"                     On November 08, 2019 he underwent CT of the neck and thyroid at [facility].  This revealed, according to the report, "No evidence of local residual or recurrent neoplastic disease. 2. No suspicious lymph nodes or evidence of metastatic disease elsewhere in the neck. 3. Chronic occlusion or critical stenosis of the proximal cervical right vertebral artery. Multifocal stenoses of the cervical left vertebral artery."  I do not currently have access to the images.   ALLERGIES:  is allergic to morphine and related.  Meds: Current Outpatient Medications  Medication Sig Dispense Refill  . aspirin EC 81 MG EC tablet Take 1 tablet (81 mg total) by mouth daily. 30 tablet 3  . atorvastatin (LIPITOR) 80 MG tablet Take 80 mg by mouth at bedtime.     . clopidogrel (PLAVIX) 75 MG tablet Take 75 mg by mouth daily.     . cyclobenzaprine (FLEXERIL) 10 MG tablet Take 10 mg by mouth 3 (three) times daily as needed for muscle spasms.     . fluconazole (DIFLUCAN) 40 MG/ML suspension Take 5 mL today, then, 2.64mL daily for 20 more days. 55 mL 0  . glipiZIDE (GLUCOTROL) 5 MG tablet Take 5 mg by  mouth 2 (two) times daily before a meal.     . insulin detemir (LEVEMIR) 100 UNIT/ML FlexPen Inject 12 Units into the skin daily. May substitute for Lantus FlexPen if Levemir is not available 15 mL 3  . insulin isophane & regular human (HUMULIN 70/30 MIX) (70-30) 100 UNIT/ML KwikPen Dose based on SSI coverage chart provided to patient by clinic 15 mL 11  . magnesium oxide (MAG-OX) 400 MG tablet Take 400 mg by mouth 2 (two) times daily.     . metFORMIN (GLUCOPHAGE) 1000 MG tablet Take 1,000 mg by mouth 2 (two) times daily.    . metoprolol tartrate (LOPRESSOR) 50 MG tablet Take 50 mg by mouth in the morning and at bedtime.     . phenytoin (DILANTIN) 125 MG/5ML suspension Take 4 mLs (100 mg total) by mouth 3 (three) times daily. 237 mL 2  . valproic acid (DEPAKENE) 250 MG/5ML  solution Take 10 mLs (500 mg total) by mouth in the morning and at bedtime. 600 mL 2  . Water For Irrigation, Sterile (FREE WATER) SOLN Place 120 mLs into feeding tube 5 (five) times daily.    Marland Kitchen lidocaine (XYLOCAINE) 2 % solution Patient: Mix 1part 2% viscous lidocaine, 1part H20. Swish & swallow 36mL of diluted mixture, 32min before meals and at bedtime, up to QID (Patient not taking: Reported on 12/08/2019) 200 mL 2  . scopolamine (TRANSDERM-SCOP) 1 MG/3DAYS Place 1 patch (1.5 mg total) onto the skin every 3 (three) days. (Patient not taking: Reported on 12/08/2019) 10 patch 1  . sodium fluoride (PREVIDENT 5000 PLUS) 1.1 % CREA dental cream Apply thin ribbon of cream to tooth brush. Brush teeth for 2 minutes. Spit out excess-DO NOT swallow. DO NOT rinse afterwards. Repeat nightly. (Patient not taking: Reported on 12/08/2019) 1 Tube prn   No current facility-administered medications for this encounter.    Physical Findings: The patient is in no acute distress. Patient is alert and oriented. Wt Readings from Last 3 Encounters:  12/08/19 135 lb 9.6 oz (61.5 kg)  10/11/19 131 lb (59.4 kg)  09/27/19 127 lb (57.6 kg)    height is 5\' 10"  (1.778 m) and weight is 135 lb 9.6 oz (61.5 kg). His temperature is 97.9 F (36.6 C). His blood pressure is 118/76 and his pulse is 96. His respiration is 20 and oxygen saturation is 100%. .  General: Alert and oriented, in no acute distress, ambulatory HEENT: Oral cavity and oropharynx demonstrate no thrush and no sign of recurrence.  His secretions have decreased in mucosa remains moist. Neck: No palpable masses; surgical incision sites are intact.  There is mild fibrosis and lymphedema in the upper neck and submental region. Skin: Skin in treatment fields has healed well  Extremities: No cyanosis or edema. Lymphatics: see Neck Exam Psychiatric: Pleasant affect, alert and oriented   Lab Findings: Lab Results  Component Value Date   WBC 7.3 09/29/2019   HGB  11.5 (L) 09/29/2019   HCT 35.9 (L) 09/29/2019   MCV 88.0 09/29/2019   PLT 373 09/29/2019    Lab Results  Component Value Date   TSH 1.101 08/15/2019    Radiographic Findings: No results found.  Impression/Plan:    1) Head and Neck Cancer Status: healing well from RT with no evidence of disease.  His main complaint today is the sensation of swelling in his throat which sometimes wakes him up at night because he feels like he is choking.  I suspect this is related to residual  lymphedema and inflammation from his treatments.  While steroids could reduce the degree of swelling in his neck and throat, that these carry significant side effects so I recommend we hold off on steroids for now.  He has been sleeping supine and I recommended that he try sleeping upright in a recliner.  He has a recliner at home and he will try this.  If his symptoms do not improve or worsen he will let us know.  [facility] at [facility] has ordered a PET scan for October.  2) Nutritional Status: escalate diet as tolerated, per nutritionist advice; gaining weight PEG tube: using   3) Risk Factors: The patient has been educated about risk factors including alcohol and tobacco abuse; they understand that avoidance of alcohol and tobacco is important to prevent recurrences as well as other cancers  4) Swallowing: dysphagia: continue SLP  5) Dental: Encouraged to continue regular followup with dentistry, and dental hygiene as much as [place] will provide.  I did my best to mold the RT fields away from intact teeth to reduce risks.   6) Thyroid function: check annually. Lab Results  Component Value Date   TSH 1.101 08/15/2019    8) Continue physical therapy for rehabilitation, neck lymphedema, fibrosis.  9) Other:  We discussed measures to reduce the risk of infection during the COVID-19 pandemic.  I again strongly recommended he received the COVID 19 vaccine.  He again declined this.  I will see him back for  follow-up in early December, sooner if needed.   On date of service, in total, I spent 25 minutes on this encounter. Patient was seen in person. _____________________________________   Eppie Gibson, MD

## 2019-12-13 ENCOUNTER — Other Ambulatory Visit: Payer: Self-pay

## 2019-12-13 ENCOUNTER — Ambulatory Visit: Payer: No Typology Code available for payment source | Admitting: Physical Therapy

## 2019-12-13 ENCOUNTER — Encounter: Payer: Self-pay | Admitting: Physical Therapy

## 2019-12-13 DIAGNOSIS — R293 Abnormal posture: Secondary | ICD-10-CM | POA: Diagnosis not present

## 2019-12-13 DIAGNOSIS — I890 Lymphedema, not elsewhere classified: Secondary | ICD-10-CM

## 2019-12-13 NOTE — Therapy (Signed)
[place], [place], [address] Phone: [phone]   Fax:  [phone]  Physical Therapy Treatment  Patient Details  Name: [name] MRN: [id number] Date of Birth: 10-09-1960 Referring Provider (PT): Reita May Date: 12/13/2019   PT End of Session - 12/13/19 1343    Visit Number 11    Number of Visits 14    Date for PT Re-Evaluation 12/06/19    PT Start Time 1303    PT Stop Time 9892    PT Time Calculation (min) 34 min    Activity Tolerance Patient tolerated treatment well    Behavior During Therapy [facility] for tasks assessed/performed           Past Medical History:  Diagnosis Date   Anxiety    Cancer ([place])    mouth   Chronic pancreatitis ([employer])    Coronary artery disease    Depression    Diabetes mellitus without complication ([place])    Hyperlipidemia    Hypertension    Myocardial infarction ([place])    Neuromuscular disorder ([place])    Sciatica    Seizures ([place])    Stroke ([place])    Substance abuse ([place])     Past Surgical History:  Procedure Laterality Date   BACK SURGERY     CORONARY STENT PLACEMENT     GASTROSTOMY TUBE PLACEMENT Left 07/31/2019   [facility]   LEFT HEART CATHETERIZATION WITH CORONARY ANGIOGRAM N/A 10/01/2011   Procedure: LEFT HEART CATHETERIZATION WITH CORONARY ANGIOGRAM;  Surgeon: Clent Demark, MD;  Location: [place] CATH LAB;  Service: Cardiovascular;  Laterality: N/A;   LEFT HEART CATHETERIZATION WITH CORONARY ANGIOGRAM N/A 03/06/2013   Procedure: LEFT HEART CATHETERIZATION WITH CORONARY ANGIOGRAM;  Surgeon: Clent Demark, MD;  Location: [place] CATH LAB;  Service: Cardiovascular;  Laterality: N/A;   PERCUTANEOUS CORONARY STENT INTERVENTION (PCI-S) N/A 10/02/2011   Procedure: PERCUTANEOUS CORONARY STENT INTERVENTION (PCI-S);  Surgeon: Clent Demark, MD;  Location: [facility] CATH LAB;  Service: Cardiovascular;  Laterality: N/A;   TRACHEOSTOMY CLOSURE       There were no vitals filed for this visit.   Subjective Assessment - 12/13/19 1319    Subjective I do not want to continue with therapy. I do not want to do therapy for my shoulder.    Patient Stated Goals to gain all info from head and neck providers    Currently in Pain? No/denies    Pain Score 0-No pain                             [facility] Adult PT Treatment/Exercise - 12/13/19 0001      Manual Therapy   Edema Management See assessment for more details. Issued info on obtaining compression garments while pt awaits to see if his insurance will cover garments. Educated pt about next steps for obtaining compression pump. Cut 1/2 in grey foam for pt to place in head and neck garment once he receives it for additional compression.                        PT Long Term Goals - 12/13/19 1320      PT LONG TERM GOAL #1   Title Pt will be independent in self MLD for long term management of lymphedema.    Baseline 11/08/19- pt does not feel independent with this; 12/13/19- pt feels like he independent with this  Time 4    Period Weeks    Status Achieved      PT LONG TERM GOAL #2   Title Pt will receive appropriate compression garments for long term management of lymphedema    Baseline 11/08/19- pt will be measured at 1130 on sept 3; 12/13/19- pt was measured for garments but plans on purchasing one online and not going through insurance    Time 4    Period Weeks    Status Achieved      PT LONG TERM GOAL #3   Title Pt will be independent in a home exercise program for continued strengthening and stretching.    Time 4    Period Weeks    Status Deferred      PT LONG TERM GOAL #4   Title Pt will demonstrate 3+/5 bilateral hip extensor strength to decrease fall risk    Baseline R and L 2+/5    Time 4    Period Weeks    Status Deferred      PT LONG TERM GOAL #5   Title Pt will demosntate 130 degrees of left shoulder abduction to allow him to reach out to  the sides    Baseline 74 with sharp pain    Time 4    Period Weeks    Status Deferred                 Plan - 12/13/19 1345    Clinical Impression Statement Assessed pt's progress towards goals. Pt wants to discontinue therapy at this time. He does not want any therapy for his shoulder with has limited ROM and tightness. Spent session today educating pt on how to manage his lymphedema long term. 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 Printed off email that stated pt needs to be seen by primary care dr at [facility] in order to get compression pump. Cut 1/2 in grey foam for pt to wear in head and neck garment for additional compression if the garment does not have enough compression. Pt will be discharged from skilled PT services at this time.    PT Frequency 1x / week    PT Duration 4 weeks    PT Treatment/Interventions ADLs/Self Care Home Management;Therapeutic exercise;Manual techniques;Manual lymph drainage;Compression bandaging;Taping;Passive range of motion;Scar mobilization;Vasopneumatic Device;Therapeutic activities;Neuromuscular re-education    PT Next Visit Plan d/c this visit    PT Home Exercise Plan head and neck ROM exercises,Access Code: [email or web address]    Consulted and Agree with Plan of Care Patient           Patient will benefit from skilled therapeutic intervention in order to improve the following deficits and impairments:  Postural dysfunction, Impaired UE functional use, Decreased range of motion, Decreased knowledge of precautions, Increased edema, Increased fascial restricitons, Decreased strength  Visit Diagnosis: Lymphedema, not elsewhere classified     Problem List Patient Active Problem List   Diagnosis Date Noted   Diabetes mellitus due to underlying condition with  diabetic nephropathy, with long-term current use of insulin ([place]) 09/27/2019   Malnutrition of moderate degree 08/16/2019   Hyperkalemia 08/15/2019   Hyperglycemia 08/15/2019   SIRS (systemic inflammatory response syndrome) ([place]) 08/15/2019   AKI (acute kidney injury) ([place]) 08/15/2019   Oral candidiasis 08/15/2019   Hypercalcemia 08/15/2019   Cancer of anterior portion of  floor of mouth ([place]) 08/08/2019   Schizophrenia spectrum disorder with psychotic disorder type not yet determined ([place]) 09/24/2015   Intermittent explosive disorder 09/20/2015   Severe recurrent major depressive disorder with psychotic features ([place]) 09/20/2015   Suicidal ideation 09/20/2015   Homicidal ideation 09/20/2015   Chest pain at rest 03/19/2013   Acute non Q wave MI (myocardial infarction), initial episode of care ([place]) 03/06/2013   Chronic sciatica of left side 09/10/2012   Personality disorder ([place]) 11/10/2011   Alcohol use disorder, mild, abuse 10/01/2011   NSTEMI (non-ST elevated myocardial infarction) ([place]) 10/01/2011   Erectile dysfunction 08/01/2010   Urge incontinence of urine 08/01/2010   Chronic alcoholic pancreatitis ([place]) 07/24/2010   Tobacco use disorder 07/24/2010   Hypertension 07/24/2010   Hyperlipidemia 07/24/2010   CVA (cerebral vascular accident) ([place]) 07/24/2010   Chronic back pain 07/24/2010    Allyson Sabal [facility] 12/13/2019, 1:50 PM  [place], [place], [address] Phone: [phone]   Fax:  [phone]  Name: [name] MRN: [id number] Date of Birth: Dec 16, 1960  PHYSICAL THERAPY DISCHARGE SUMMARY  Visits from Start of Care: 11 Current functional level related to goals / functional outcomes: See above   Remaining deficits: Still has limited shoulder ROM, pain and tightness but wishes to be d/c   Education / Equipment: HEP, self MLD, compression garments  and pump  Plan: Patient agrees to discharge.  Patient goals were partially met. Patient is being discharged due to the patient's request.  ?????    Allyson Sabal [place], [place] 12/13/19 1:51 PM

## 2020-01-05 ENCOUNTER — Ambulatory Visit: Payer: No Typology Code available for payment source | Attending: Radiation Oncology

## 2020-01-19 ENCOUNTER — Telehealth: Payer: Self-pay | Admitting: Nutrition

## 2020-01-19 ENCOUNTER — Ambulatory Visit: Payer: Medicare PPO | Admitting: Nutrition

## 2020-01-19 NOTE — Progress Notes (Signed)
See telephone note.

## 2020-01-19 NOTE — Telephone Encounter (Signed)
Nutrition follow-up completed with patient over the telephone. Patient is status post radiation therapy for cancer of the oral cavity/floor of mouth. Weight improved and was documented as 135.6 pounds on September 24. Patient is safe for pured diet with thin liquids.  He does not have teeth so it is difficult for him to eat food.   I asked patient about adding pured foods and he has refused.Reports he does not like pured foods. I have encouraged him to try thin liquids by mouth to help strengthen his swallowing muscles.  Provided examples. Continue Dillard Essex 1.4-5 times daily with 60 mL of water before and after bolus feedings.  Continue 30 mL of Prosource twice daily. This provides 2475 cal, 130 g protein, 275 g carbohydrate.  This is 100% estimated nutrition needs. Patient encouraged to reorder Dillard Essex 1.4 from his home care agency so he does not run out.  Patient agrees and verbalizes understanding. I will contact patient for nutrition follow-up in about 1 month.

## 2020-02-15 NOTE — Progress Notes (Signed)
Mr. Burgert presents today for follow-up after completing radiation to his floor of mouth on 09/27/2019  Pain issues, if any: Patient denies Using a feeding tube?: Yes--reports doing 5 feedings a day without any issues; 01/19/2020 Had telephone F/U with Bard Neff-RD: "Patient is safe for pured diet with thin liquids. He does not have teeth so it is difficult for him to eat food. I asked patient about adding pured foods and he has refused.Reports he does not like pured foods. I have encouraged him to try thin liquids by mouth to help strengthen his swallowing muscles.  Provided examples. Continue Dillard Essex 1.4-5 times daily with 60 mL of water before and after bolus feedings.  Continue 30 mL of Prosource twice daily." Weight changes, if any:  Wt Readings from Last 3 Encounters:  02/16/20 131 lb 9.6 oz (59.7 kg)  12/08/19 135 lb 9.6 oz (61.5 kg)  10/11/19 131 lb (59.4 kg)   Swallowing issues, if any: Reports he is able to tolerate soups and softer foods occasionally (like chicken and rice), denies any issues with thin liquids--but continues to primarily use PEG tube for nutrition; 12/08/2019 Saw Glendell Docker Shincke-SLP: "Demon reports he would rather have tube feed to PO due to disliking pureed, adn not having teeth to chew. SLP told pt we have seen he is safest with pureed at this point and if he had teeth he well might not be safe with food he could masticate. Pt reports he has something PO each day - yesterday it was a couple bites of cookie he let "melt" in his mouth snd then he swallowed. SLP educated pt (re-educated) on late effects of head/neck radiation on swallow function. Pt to decide if he wants to use PEG or do POs to gain strength in order to give him best opportunity to regain some swallow strength and possibly have something OTHER THAN puree, which he does not like. SLP explained pt's POs are not primarily for pleasure right now but for regaining swallow strength. SLP trained pt in use of  copmensatory strategies from FEES in August 2021- small bites/sips, alternate bite/sip, fully swallow prior to inserting next bolus, multiple swallows. Occasional min cues for multiple swallows (fully swalow) and to alternate bite/sip, which was faded to independent by second of three solid/liquid reps. SLP reminded pt of muscle fibrosis as a danger of not completing HEP and reviewed HEP with pt (he has not/had not completed this). SLP told pt to do two-three-four reps of each swallow exercise and put reps of non-swallowing exercise (pitch raise) between them to give pt more stamina." Smoking or chewing tobacco? [place] to smoke ~4 cigarettes day  Using fluoride trays daily? N/A Last ENT visit was on: 02/01/2020 Saw Dr. Harriett Rush [place];  "1. 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 Neck drainage  - This is likely from non union of flap to native mandible site vs hardware infection; less likely to be 2/2 recurrent disease.  - this has now stopped on antibiotics and packing.  - advised them that given clinical history, it may do the same again. Have asked him to call the office if he starts draining again.  Follow up in Feb 22 as scheduled"  Other notable issues, if any: Denies any ear or jaw pain, or difficulty opening his mouth. Continues to do exercises per PT's recommendations (12/13/2019: "  Assessed pt's progress towards goals. Pt wants to discontinue therapy at this time. He does not want any therapy for his shoulder with has limited ROM and tightness. Spent session today educating pt on how to manage his lymphedema long term. 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 Printed  off email that stated pt needs to be seen by primary care dr at [facility] in order to get compression pump. Cut 1/2 in grey foam for pt to wear in head and neck garment for additional compression if the garment does not have enough compression. Pt will be discharged from skilled PT services at this time. "). Continues to deal with extra saliva. Reports impvorement in sleeping habits.   Vitals:   02/16/20 1119  BP: 96/76  Pulse: (!) 113  Resp: 18  Temp: 98.2 F (36.8 C)  SpO2: 100%

## 2020-02-16 ENCOUNTER — Ambulatory Visit
Admission: RE | Admit: 2020-02-16 | Discharge: 2020-02-16 | Disposition: A | Discharge status: Home / Self Care | Payer: Medicare PPO | Source: Ambulatory Visit | Attending: Radiation Oncology | Admitting: Radiation Oncology

## 2020-02-16 ENCOUNTER — Other Ambulatory Visit: Payer: Self-pay

## 2020-02-16 VITALS — BP 96/76 | HR 113 | Temp 98.2°F | Resp 18 | Ht 70.0 in | Wt 131.6 lb

## 2020-02-16 DIAGNOSIS — C040 Malignant neoplasm of anterior floor of mouth: Secondary | ICD-10-CM | POA: Diagnosis not present

## 2020-02-16 DIAGNOSIS — Z08 Encounter for follow-up examination after completed treatment for malignant neoplasm: Secondary | ICD-10-CM | POA: Diagnosis not present

## 2020-02-16 DIAGNOSIS — Z923 Personal history of irradiation: Secondary | ICD-10-CM | POA: Diagnosis not present

## 2020-02-23 ENCOUNTER — Encounter: Payer: Self-pay | Admitting: Radiation Oncology

## 2020-02-23 ENCOUNTER — Other Ambulatory Visit: Payer: Self-pay

## 2020-02-23 DIAGNOSIS — C040 Malignant neoplasm of anterior floor of mouth: Secondary | ICD-10-CM

## 2020-02-23 DIAGNOSIS — Z1329 Encounter for screening for other suspected endocrine disorder: Secondary | ICD-10-CM

## 2020-02-23 NOTE — Progress Notes (Signed)
Radiation Oncology         [phone] ________________________________  Name: [name] MRN: [id number]  Date: 02/16/2020  DOB: 1961-03-13  Follow-Up Visit Note  CC: Clinic, Willie Donna Neomia Dear, MD  Diagnosis and Prior Radiotherapy:       ICD-10-CM   1. 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 He does not have teeth so it is difficult for him to eat food. I asked patient about adding pured foods and he has refused.Reports he does not like pured foods. I have encouraged him to try thin liquids by mouth to help strengthen his swallowing muscles.  Provided examples. Continue Dillard Essex 1.4-5 times daily with 60 mL of water before and after bolus feedings.  Continue 30 mL of Prosource twice daily."  Weight changes, if any:  Wt Readings from Last 3 Encounters:  02/16/20 131 lb 9.6 oz (59.7 kg)  12/08/19 135 lb 9.6 oz (61.5 kg)  10/11/19 131 lb (59.4 kg)   Swallowing issues, if any: Reports he is able to tolerate soups and softer foods occasionally (like chicken and rice), denies any issues with thin liquids--but continues to primarily use PEG tube for nutrition; 12/08/2019 Saw [name]: "[name] reports he would rather have tube feed to PO due to disliking pureed, adn not having teeth to chew. SLP told pt we have seen he is safest with pureed at this point and if he had teeth he well might not be safe with food he could masticate. Pt reports he has something PO each day - yesterday it was a couple bites of cookie he let  "melt" in his mouth snd then he swallowed. SLP educated pt (re-educated) on late effects of head/neck radiation on swallow function. Pt to decide if he wants to use PEG or do POs to gain strength in order to give him best opportunity to regain some swallow strength and possibly have something OTHER THAN puree, which he does not like. SLP explained pt's POs are not primarily for pleasure right now but for regaining swallow strength. SLP trained pt in use of copmensatory strategies from FEES in August 2021- small bites/sips, alternate bite/sip, fully swallow prior to inserting next bolus, multiple swallows. Occasional min cues for multiple swallows (fully swalow) and to alternate bite/sip, which was faded to independent by second of three solid/liquid reps. SLP reminded pt of muscle fibrosis as a danger of not completing HEP and reviewed HEP with pt (he has not/had not completed this). SLP told pt to do two-three-four reps of each swallow exercise and put reps of non-swallowing exercise (pitch raise) between them to give pt more stamina."  Smoking or chewing tobacco? [place] to smoke ~4 cigarettes day AGAINST MEDICAL ADVICE  Using fluoride trays daily? N/A Last ENT visit was on: 02/01/2020 Saw [name];  "1. 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  Neck drainage  - This is likely from non union of flap to native mandible site vs hardware infection; less likely to be 2/2 recurrent disease.  - this has now stopped on antibiotics and packing.  - advised them that given clinical history, it may do the same again. Have asked him to call the office if he starts draining again.  Follow up in Feb 22 as scheduled"  Other notable issues, if any: Denies any ear or jaw pain, or difficulty opening his mouth. Continues to do exercises per PT's recommendations (12/13/2019:  "Assessed pt's progress towards goals. Pt wants to discontinue therapy at this time. He does not want any therapy for his shoulder with has limited ROM and tightness. Spent session today educating pt on how to manage his lymphedema long term. 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 Printed off email that stated pt needs to be seen by primary care dr at [facility] in order to get compression pump. Cut 1/2 in grey foam for pt to wear in head and neck garment for additional compression if the garment does not have enough compression. Pt will be discharged from skilled PT services at this time. "). Continues to deal with extra saliva. Reports improvement in sleeping habits.   He underwent a nondiagnostic PET scan in October at [facility].  Biodistribution was altered.  Recommendations made for reattempt with optimization of blood sugar control.   Vitals:   02/16/20 1119  BP: 96/76  Pulse: (!) 113  Resp: 18  Temp: 98.2 F (36.8 C)  SpO2: 100%   ALLERGIES:  is allergic to morphine and related.  Meds: Current Outpatient Medications  Medication Sig Dispense Refill  . aspirin EC 81 MG EC tablet Take 1 tablet (81 mg total) by mouth daily. 30 tablet 3  . atorvastatin (LIPITOR) 80 MG tablet Take 80 mg by mouth at bedtime.     . clopidogrel (PLAVIX) 75 MG tablet Take 75 mg by mouth daily.     . cyclobenzaprine (FLEXERIL) 10 MG tablet Take 10 mg by mouth 3 (three) times daily as needed for muscle spasms.     . fluconazole (DIFLUCAN) 40 MG/ML suspension Take 5 mL today, then, 2.60mL daily for 20 more days. 55 mL 0  . glipiZIDE (GLUCOTROL) 5 MG tablet Take 5 mg by mouth 2 (two) times daily before a meal.     . insulin detemir (LEVEMIR) 100 UNIT/ML FlexPen Inject 12 Units into the skin daily. May substitute for Lantus  FlexPen if Levemir is not available 15 mL 3  . insulin isophane & regular human (HUMULIN 70/30 MIX) (70-30) 100 UNIT/ML KwikPen Dose based on SSI coverage chart provided to patient by clinic 15 mL 11  . lidocaine (XYLOCAINE) 2 % solution Patient: Mix 1part 2% viscous lidocaine, 1part H20. Swish & swallow 21mL of diluted mixture, 61min before meals and at bedtime, up to QID (Patient not taking: Reported on 12/08/2019) 200 mL 2  . magnesium oxide (MAG-OX) 400 MG tablet Take 400 mg by mouth 2 (two) times daily.     . metFORMIN (GLUCOPHAGE) 1000 MG tablet Take 1,000 mg by mouth 2 (two) times daily.    . metoprolol tartrate (LOPRESSOR) 50 MG tablet Take 50 mg by mouth in the morning and at bedtime.     . phenytoin (DILANTIN)  125 MG/5ML suspension Take 4 mLs (100 mg total) by mouth 3 (three) times daily. 237 mL 2  . scopolamine (TRANSDERM-SCOP) 1 MG/3DAYS Place 1 patch (1.5 mg total) onto the skin every 3 (three) days. (Patient not taking: Reported on 12/08/2019) 10 patch 1  . sodium fluoride (PREVIDENT 5000 PLUS) 1.1 % CREA dental cream Apply thin ribbon of cream to tooth brush. Brush teeth for 2 minutes. Spit out excess-DO NOT swallow. DO NOT rinse afterwards. Repeat nightly. (Patient not taking: Reported on 12/08/2019) 1 Tube prn  . valproic acid (DEPAKENE) 250 MG/5ML solution Take 10 mLs (500 mg total) by mouth in the morning and at bedtime. 600 mL 2  . Water For Irrigation, Sterile (FREE WATER) SOLN Place 120 mLs into feeding tube 5 (five) times daily.     No current facility-administered medications for this encounter.    Physical Findings: The patient is in no acute distress. Patient is alert and oriented. Wt Readings from Last 3 Encounters:  02/16/20 131 lb 9.6 oz (59.7 kg)  12/08/19 135 lb 9.6 oz (61.5 kg)  10/11/19 131 lb (59.4 kg)    height is 5\' 10"  (1.778 m) and weight is 131 lb 9.6 oz (59.7 kg). His temperature is 98.2 F (36.8 C). His blood pressure is 96/76 and his pulse is 113  (abnormal). His respiration is 18 and oxygen saturation is 100%. .  General: Alert and oriented, in no acute distress, ambulatory HEENT: Oral cavity and oropharynx demonstrate no thrush and no sign of recurrence.   Neck: No palpable masses; surgical incision sites are intact.  There is fibrosis and lymphedema in the upper neck and submental region. Skin: Skin in treatment fields has healed well  Lymphatics: see Neck Exam Heart regular in rhythm, slightly tachycardic Chest clear to auscultation bilaterally Psychiatric: Pleasant affect, alert and oriented   Lab Findings: Lab Results  Component Value Date   WBC 7.3 09/29/2019   HGB 11.5 (L) 09/29/2019   HCT 35.9 (L) 09/29/2019   MCV 88.0 09/29/2019   PLT 373 09/29/2019    Lab Results  Component Value Date   TSH 1.101 08/15/2019    Radiographic Findings: No results found.  Impression/Plan:    1) Head and Neck Cancer Status: No evidence of disease  2) Nutritional Status: Weight is stable; patient is largely reliant on PEG tube.  Expresses reluctance to take food by mouth.  Has been followed closely by swallowing therapy nutritionist Wt Readings from Last 3 Encounters:  02/16/20 131 lb 9.6 oz (59.7 kg)  12/08/19 135 lb 9.6 oz (61.5 kg)  10/11/19 131 lb (59.4 kg)    3) Risk Factors: The patient has been educated about risk factors including alcohol and tobacco abuse; they understand that avoidance of alcohol and tobacco is important to prevent recurrences as well as other cancers; unfortunately he is still smoking AGAINST MEDICAL ADVICE  4) Swallowing: dysphagia: continue SLP  5) Dental: Encouraged to continue regular followup with dentistry, and dental hygiene as much as [place] will provide.  I did my best to mold the RT fields away from intact teeth to reduce risks.   6) Thyroid function: check annually. Lab Results  Component Value Date   TSH 1.101 08/15/2019    8) follow advice of physical therapy for rehabilitation,  neck lymphedema, fibrosis.  9) Other:  We discussed measures to reduce the risk of infection during the COVID-19 pandemic.  I again strongly recommended he received the COVID 19 vaccine.  He again declined  this.  I will see him back for follow-up in May 2022, sooner if needed.   On date of service, in total, I spent 25 minutes on this encounter. Patient was seen in person. _____________________________________   Eppie Gibson, MD

## 2020-02-27 ENCOUNTER — Telehealth: Payer: Self-pay

## 2020-02-27 NOTE — Telephone Encounter (Signed)
Nutrition  Called patient for nutrition follow-up.  No answer.  Left message requesting call back.  RD phone number provided.   Ward Boissonneault B. Zenia Resides, [place], [place] Registered Dietitian [phone] (mobile)

## 2020-06-08 ENCOUNTER — Other Ambulatory Visit: Payer: Self-pay

## 2020-06-08 ENCOUNTER — Emergency Department (HOSPITAL_COMMUNITY): Payer: No Typology Code available for payment source

## 2020-06-08 ENCOUNTER — Encounter (HOSPITAL_COMMUNITY): Payer: Self-pay

## 2020-06-08 ENCOUNTER — Emergency Department (HOSPITAL_COMMUNITY)
Admission: EM | Admit: 2020-06-08 | Discharge: 2020-06-08 | Disposition: A | Discharge status: Home / Self Care | Payer: No Typology Code available for payment source | Attending: Emergency Medicine | Admitting: Emergency Medicine

## 2020-06-08 DIAGNOSIS — L0201 Cutaneous abscess of face: Secondary | ICD-10-CM | POA: Insufficient documentation

## 2020-06-08 DIAGNOSIS — I10 Essential (primary) hypertension: Secondary | ICD-10-CM | POA: Insufficient documentation

## 2020-06-08 DIAGNOSIS — Z955 Presence of coronary angioplasty implant and graft: Secondary | ICD-10-CM | POA: Insufficient documentation

## 2020-06-08 DIAGNOSIS — E1121 Type 2 diabetes mellitus with diabetic nephropathy: Secondary | ICD-10-CM | POA: Diagnosis not present

## 2020-06-08 DIAGNOSIS — Z85818 Personal history of malignant neoplasm of other sites of lip, oral cavity, and pharynx: Secondary | ICD-10-CM | POA: Insufficient documentation

## 2020-06-08 DIAGNOSIS — I2510 Atherosclerotic heart disease of native coronary artery without angina pectoris: Secondary | ICD-10-CM | POA: Insufficient documentation

## 2020-06-08 DIAGNOSIS — Z87891 Personal history of nicotine dependence: Secondary | ICD-10-CM | POA: Insufficient documentation

## 2020-06-08 DIAGNOSIS — R221 Localized swelling, mass and lump, neck: Secondary | ICD-10-CM | POA: Diagnosis not present

## 2020-06-08 DIAGNOSIS — R220 Localized swelling, mass and lump, head: Secondary | ICD-10-CM | POA: Diagnosis not present

## 2020-06-08 LAB — COMPREHENSIVE METABOLIC PANEL
ALT: 16 U/L (ref 0–44)
AST: 17 U/L (ref 15–41)
Albumin: 4.2 g/dL (ref 3.5–5.0)
Alkaline Phosphatase: 76 U/L (ref 38–126)
Anion gap: 11 (ref 5–15)
BUN: 20 mg/dL (ref 6–20)
CO2: 25 mmol/L (ref 22–32)
Calcium: 9.2 mg/dL (ref 8.9–10.3)
Chloride: 101 mmol/L (ref 98–111)
Creatinine, Ser: 0.69 mg/dL (ref 0.61–1.24)
GFR, Estimated: >60 mL/min (ref >60)
Glucose, Bld: 219 mg/dL — ABNORMAL HIGH (ref 70–99)
Potassium: 4.4 mmol/L (ref 3.5–5.1)
Sodium: 137 mmol/L (ref 135–145)
Total Bilirubin: 0.7 mg/dL (ref 0.3–1.2)
Total Protein: 7.4 g/dL (ref 6.5–8.1)

## 2020-06-08 LAB — CBC WITH DIFFERENTIAL/PLATELET
Abs Immature Granulocytes: 0.05 10*3/uL (ref 0.00–0.07)
Basophils Absolute: 0 10*3/uL (ref 0.0–0.1)
Basophils Relative: 0 %
Eosinophils Absolute: 0.2 10*3/uL (ref 0.0–0.5)
Eosinophils Relative: 2 %
HCT: 39.1 % (ref 39.0–52.0)
Hemoglobin: 12.4 g/dL — ABNORMAL LOW (ref 13.0–17.0)
Immature Granulocytes: 0 %
Lymphocytes Relative: 8 %
Lymphs Abs: 1 10*3/uL (ref 0.7–4.0)
MCH: 27.8 pg (ref 26.0–34.0)
MCHC: 31.7 g/dL (ref 30.0–36.0)
MCV: 87.7 fL (ref 80.0–100.0)
Monocytes Absolute: 0.9 10*3/uL (ref 0.1–1.0)
Monocytes Relative: 8 %
Neutro Abs: 10 10*3/uL — ABNORMAL HIGH (ref 1.7–7.7)
Neutrophils Relative %: 82 %
Platelets: 290 10*3/uL (ref 150–400)
RBC: 4.46 MIL/uL (ref 4.22–5.81)
RDW: 12.5 % (ref 11.5–15.5)
WBC: 12.2 10*3/uL — ABNORMAL HIGH (ref 4.0–10.5)
nRBC: 0 % (ref 0.0–0.2)

## 2020-06-08 MED ORDER — AMOXICILLIN-POT CLAVULANATE 400-57 MG/5ML PO SUSR
875.0000 mg | Freq: Two times a day (BID) | ORAL | 0 refills | Status: AC
Start: 2020-06-08 — End: 2020-06-18

## 2020-06-08 MED ORDER — LIDOCAINE-EPINEPHRINE 2 %-1:100000 IJ SOLN
1.0000 mL | Freq: Once | INTRAMUSCULAR | Status: AC
  Administered 2020-06-08: 1 mL
  Filled 2020-06-08: qty 1

## 2020-06-08 MED ORDER — FENTANYL CITRATE (PF) 100 MCG/2ML IJ SOLN
50.0000 ug | Freq: Once | INTRAMUSCULAR | Status: AC
  Administered 2020-06-08: 50 ug via INTRAVENOUS
  Filled 2020-06-08: qty 2

## 2020-06-08 MED ORDER — IOHEXOL 300 MG/ML  SOLN
75.0000 mL | Freq: Once | INTRAMUSCULAR | Status: AC | PRN
  Administered 2020-06-08: 75 mL via INTRAVENOUS

## 2020-06-08 NOTE — ED Notes (Signed)
Patient stated that his pain in his mouth is throbbing and a pain scale of 10 RN notified

## 2020-06-08 NOTE — ED Notes (Signed)
Placed ice pack on patients cheek for pain relief and swelling

## 2020-06-08 NOTE — ED Provider Notes (Signed)
Care of the patient assumed at the change of shift. Patient with prior oral cancer, s/p resection with flap, radiation treatments and occasional episodes of fluid drainage from his R lower face, previously treated with packing and Abx. Here for pain and swelling in that same area, awaiting labs and CT.  Physical Exam  BP 134/69   Pulse 85   Temp 98.4 F (36.9 C) (Oral)   Resp 17   Ht 5\' 10"  (1.778 m)   Wt 60 kg   SpO2 100%   BMI 18.98 kg/m   Physical Exam Patient's facial anatomy is distorted at baseline due to prior surgery.  There is an area of focal tenderness just below the R mandible with focal fluctuance.  ED Course/Procedures   Clinical Course as of 06/08/20 1045  Sat Jun 08, 2020  0952 CT images reviewed, prior visits from [facility] ENT reviewed. Will touch base with them regarding this complicated patient to determine next best steps. [CS]  13 Spoke with Dr. Willadean Carol, ENT at [facility] who recommends I&D in the ED and outpatient follow up in their office.  [CS]    Clinical Course User Index [CS] Truddie Hidden, MD    ..Incision and Drainage  Date/Time: 06/08/2020 10:41 AM Performed by: Truddie Hidden, MD Authorized by: Truddie Hidden, MD   Consent:    Consent obtained:  Verbal   Consent given by:  Patient   Risks discussed:  Pain Universal protocol:    Patient identity confirmed:  Verbally with patient Location:    Type:  Abscess   Location:  Head   Head location:  Face Pre-procedure details:    Skin preparation:  Povidone-iodine Sedation:    Sedation type:  None Anesthesia:    Anesthesia method:  Local infiltration   Local anesthetic:  Lidocaine 2% WITH epi Procedure type:    Complexity:  Simple Procedure details:    Incision types:  Single straight   Incision depth:  Subcutaneous   Wound management:  Probed and deloculated   Drainage:  Purulent   Drainage amount:  Moderate   Wound treatment:  Drain placed   Packing materials:  1/4 in  gauze Post-procedure details:    Procedure completion:  Tolerated    MDM         Truddie Hidden, MD 06/08/20 1045

## 2020-06-08 NOTE — ED Notes (Signed)
Patient ambulated to the bathroom without assistance.

## 2020-06-08 NOTE — ED Provider Notes (Signed)
Emergency Department Provider Note   I have reviewed the triage vital signs and the nursing notes.   HISTORY  Chief Complaint Facial Swelling   HPI [name] is a 60 y.o. male with past medical history reviewed below including oral cancer with history of radiation therapy presents with pain and swelling to the right side of the cheek and face.  He notes a prior history of this in the past with the area spontaneously draining.  He developed worsening pain in the past 24 hours.  Denies any difficulty swallowing.  No shortness of breath.  Pain is moderate to severe and without radiation.  Worse with touching the cheek area.  No dental pain.  Past Medical History:  Diagnosis Date  . Anxiety   . Cancer ([place])    mouth  . Chronic pancreatitis ([place])   . Coronary artery disease   . Depression   . Diabetes mellitus without complication ([place])   . Hyperlipidemia   . Hypertension   . Myocardial infarction ([place])   . Neuromuscular disorder ([place])   . Sciatica   . Seizures ([place])   . Stroke ([place])   . Substance abuse [facility])     Patient Active Problem List   Diagnosis Date Noted  . Diabetes mellitus due to underlying condition with diabetic nephropathy, with Alexias Margerum-term current use of insulin ([place]) 09/27/2019  . Malnutrition of moderate degree 08/16/2019  . Hyperkalemia 08/15/2019  . Hyperglycemia 08/15/2019  . SIRS (systemic inflammatory response syndrome) ([place]) 08/15/2019  . AKI (acute kidney injury) ([place]) 08/15/2019  . Oral candidiasis 08/15/2019  . Hypercalcemia 08/15/2019  . 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 Schizophrenia spectrum disorder with psychotic disorder type not yet determined ([place]) 09/24/2015  . Intermittent explosive disorder 09/20/2015  . Severe recurrent major depressive disorder with psychotic features ([place]) 09/20/2015  . Suicidal ideation 09/20/2015  . Homicidal ideation 09/20/2015  . Chest pain at rest 03/19/2013  . Acute  non Q wave MI (myocardial infarction), initial episode of care ([place]) 03/06/2013  . Chronic sciatica of left side 09/10/2012  . Personality disorder ([place]) 11/10/2011  . Alcohol use disorder, mild, abuse 10/01/2011  . NSTEMI (non-ST elevated myocardial infarction) ([place]) 10/01/2011  . Erectile dysfunction 08/01/2010  . Urge incontinence of urine 08/01/2010  . Chronic alcoholic pancreatitis ([place]) 07/24/2010  . Tobacco use disorder 07/24/2010  . Hypertension 07/24/2010  . Hyperlipidemia 07/24/2010  . CVA (cerebral vascular accident) ([place]) 07/24/2010  . Chronic back pain 07/24/2010    Past Surgical History:  Procedure Laterality Date  . BACK SURGERY    . CORONARY STENT PLACEMENT    . GASTROSTOMY TUBE PLACEMENT Left 07/31/2019   [facility]  . LEFT HEART CATHETERIZATION WITH CORONARY ANGIOGRAM N/A 10/01/2011   Procedure: LEFT HEART CATHETERIZATION WITH CORONARY ANGIOGRAM;  Surgeon: Clent Demark, MD;  Location: [facility] CATH LAB;  Service: Cardiovascular;  Laterality: N/A;  . LEFT HEART CATHETERIZATION WITH CORONARY ANGIOGRAM N/A 03/06/2013   Procedure: LEFT HEART CATHETERIZATION WITH CORONARY ANGIOGRAM;  Surgeon: Clent Demark, MD;  Location: [place] CATH LAB;  Service: Cardiovascular;  Laterality: N/A;  . PERCUTANEOUS CORONARY STENT INTERVENTION (PCI-S) N/A 10/02/2011   Procedure: PERCUTANEOUS CORONARY STENT INTERVENTION (PCI-S);  Surgeon: Clent Demark, MD;  Location: [facility] CATH LAB;  Service: Cardiovascular;  Laterality: N/A;  . TRACHEOSTOMY CLOSURE      Allergies Morphine and related  Family History  Family history unknown: Yes    Social History Social History  Tobacco Use  . Smoking status: Former Smoker    Packs/day: 1.00    Years: 40.00    Pack years: 40.00    Types: Cigarettes  . Smokeless tobacco: Never Used  Vaping Use  . Vaping Use: Never used  Substance Use Topics  . Alcohol use: Not Currently  . Drug use: Never    Review of Systems  Constitutional: No  fever/chills Eyes: No visual changes. ENT: No sore throat. Positive right face pain and swelling.  Cardiovascular: Denies chest pain. Respiratory: Denies shortness of breath. Gastrointestinal: No abdominal pain.  No nausea, no vomiting.  No diarrhea.  No constipation. Genitourinary: Negative for dysuria. Musculoskeletal: Negative for back pain. Skin: Negative for rash. Neurological: Negative for headaches, focal weakness or numbness.  10-point ROS otherwise negative.  ____________________________________________   PHYSICAL EXAM:  VITAL SIGNS: ED Triage Vitals  Enc Vitals Group     BP 06/08/20 0513 (!) 155/103     Pulse Rate 06/08/20 0513 (!) 105     Resp 06/08/20 0621 18     Temp 06/08/20 0513 98.4 F (36.9 C)     Temp Source 06/08/20 0513 Oral     SpO2 06/08/20 0513 96 %     Weight 06/08/20 0513 130 lb (59 kg)     Height 06/08/20 0513 5\' 10"  (1.778 m)   Constitutional: Alert and oriented. Well appearing and in no acute distress. Eyes: Conjunctivae are normal. Head: Atraumatic. Nose: No congestion/rhinnorhea. Mouth/Throat: Mucous membranes are moist.  Patient has some scarring and abnormal face and neck anatomy with his history of mouth cancer and radiation to the area.  I do not appreciate any obvious, superficial abscess or face cellulitis.  Neck: No stridor.   Cardiovascular: Tachcyardia. Good peripheral circulation. Grossly normal heart sounds.   Respiratory: Normal respiratory effort.  No retractions. Lungs CTAB. Gastrointestinal: Soft and nontender. No distention.  Musculoskeletal: No gross deformities of extremities. Neurologic:  Normal speech and language.  Skin:  Skin is warm, dry and intact. No rash noted.  ____________________________________________   LABS (all labs ordered are listed, but only abnormal results are displayed)  Labs Reviewed  COMPREHENSIVE METABOLIC PANEL - Abnormal; Notable for the following components:      Result Value   Glucose, Bld  219 (*)    All other components within normal limits  CBC WITH DIFFERENTIAL/PLATELET - Abnormal; Notable for the following components:   WBC 12.2 (*)    Hemoglobin 12.4 (*)    Neutro Abs 10.0 (*)    All other components within normal limits  AEROBIC CULTURE W GRAM STAIN (SUPERFICIAL SPECIMEN)   ____________________________________________  RADIOLOGY  CT pending.  ____________________________________________   PROCEDURES  Procedure(s) performed:   Procedures  None  ____________________________________________   INITIAL IMPRESSION / ASSESSMENT AND PLAN / ED COURSE  Pertinent labs & imaging results that were available during my care of the patient were reviewed by me and considered in my medical decision making (see chart for details).   Patient presents emergency department evaluation of right cheek pain and swelling.  He has abnormal face and neck anatomy with history of cancer and face radiation.  Not feel any fluctuance or erythema.  There is no clear abscess that would be amenable to drainage at the bedside.  Patient is very tender to touching the right cheek and does appear somewhat swollen.  I do plan for CT imaging of the face and neck in this context with exam difficult.  Will obtain basic labs and reassess  after CT imaging.  Care transferred to Dr. Karle Starch pending CT results and disposition.  ____________________________________________  FINAL CLINICAL IMPRESSION(S) / ED DIAGNOSES  Final diagnoses:  Facial abscess     MEDICATIONS GIVEN DURING THIS VISIT:  Medications  iohexol (OMNIPAQUE) 300 MG/ML solution 75 mL (75 mLs Intravenous Contrast Given 06/08/20 0856)  fentaNYL (SUBLIMAZE) injection 50 mcg (50 mcg Intravenous Given 06/08/20 1015)  lidocaine-EPINEPHrine (XYLOCAINE W/EPI) 2 %-1:100000 (with pres) injection 1 mL (1 mL Other Given by Other 06/08/20 1035)     NEW OUTPATIENT MEDICATIONS STARTED DURING THIS VISIT:  Discharge Medication List as of 06/08/2020  10:46 AM    START taking these medications   Details  amoxicillin-clavulanate (AUGMENTIN) 400-57 MG/5ML suspension Place 10.9 mLs (875 mg total) into feeding tube 2 (two) times daily for 10 days., Starting Sat 06/08/2020, Until Tue 06/18/2020, Normal        Note:  This document was prepared using Dragon voice recognition software and may include unintentional dictation errors.  Nanda Quinton, MD, [facility]    Perlita Forbush, [facility], MD 06/08/20 2035

## 2020-06-08 NOTE — ED Triage Notes (Signed)
Pt arrived via walk in, c/o right sided facial/jaw swelling. Hx of mouth cancer. States he has had this happen twice in the past, swelling until area bust. States swelling/pain increased last night.

## 2020-06-12 LAB — AEROBIC CULTURE W GRAM STAIN (SUPERFICIAL SPECIMEN)

## 2020-06-13 ENCOUNTER — Telehealth: Payer: Self-pay | Admitting: *Deleted

## 2020-06-13 NOTE — Progress Notes (Signed)
ED Antimicrobial Stewardship Positive Culture Follow Up   [name] is an 60 y.o. male who presented to [facility] on 06/08/2020 with a chief complaint of  Chief Complaint  Patient presents with  . Facial Swelling    Recent Results (from the past 720 hour(s))  Aerobic Culture w Gram Stain (superficial specimen)     Status: None   Collection Time: 06/08/20 10:56 AM   Specimen: Face; Abscess  Result Value Ref Range Status   Specimen Description   Final    FACE Performed at [facility] [address] [place], [place] [address]    Special Requests   Final    NONE Performed at [facility], [place] [address], [place],  [address]    Gram Stain   Final    RARE WBC PRESENT, PREDOMINANTLY PMN FEW GRAM POSITIVE COCCI FEW GRAM NEGATIVE RODS    Culture   Final    FEW STREPTOCOCCUS GROUP F FEW EIKENELLA CORRODENS Usually susceptible to penicillin and other beta lactam agents,quinolones,macrolides and tetracyclines. Performed at [facility], [place] [address], [place],  [address]    Report Status 06/12/2020 FINAL  Final   CT did show presumed osteo/abscess. Augmentin will cover but may need extended course of antibiotics. Already followed up at [facility] and had documentation that they saw the CT results.   No further follow up needed from [facility] ED.  Elenor Quinones, PharmD, BCPS, BCIDP Clinical Pharmacist 06/13/2020 10:05 AM

## 2020-06-13 NOTE — Telephone Encounter (Signed)
Post ED Visit - Positive Culture Follow-up  Culture report reviewed by antimicrobial stewardship pharmacist: [place] Team []  Elenor Quinones, Pharm.D. []  Heide Guile, Pharm.D., BCPS AQ-ID []  Parks Neptune, Pharm.D., BCPS []  Alycia Rossetti, Pharm.D., BCPS []  Maunie, Pharm.D., BCPS, AAHIVP []  Legrand Como, Pharm.D., BCPS, AAHIVP []  Salome Arnt, PharmD, BCPS []  Johnnette Gourd, PharmD, BCPS []  Hughes Better, PharmD, BCPS []  Leeroy Cha, PharmD []  Laqueta Linden, PharmD, BCPS []  Albertina Parr, PharmD  [place] Team []  Leodis Sias, PharmD []  Lindell Spar, PharmD []  Royetta Asal, PharmD []  Graylin Shiver, Rph []  Rema Fendt) Glennon Mac, PharmD []  Arlyn Dunning, PharmD []  Netta Cedars, PharmD []  Dia Sitter, PharmD []  Leone Haven, PharmD []  Gretta Arab, PharmD []  Theodis Shove, PharmD []  Peggyann Juba, PharmD []  Reuel Boom, PharmD   Positive wound culture Following up with Dr. Willadean Carol and no further patient follow-up is required at this time.  Harlon Flor [facility] 06/13/2020, 10:46 AM

## 2020-08-08 ENCOUNTER — Telehealth: Payer: Self-pay | Admitting: *Deleted

## 2020-08-08 NOTE — Telephone Encounter (Signed)
CALLED PATIENT TO REMIND OF LAB AND FU FOR 08-09-20- LAB - 10:15 AM AND FU - 10:40 AM, LVM FOR A RETURN CALL

## 2020-08-09 ENCOUNTER — Telehealth: Payer: Self-pay | Admitting: *Deleted

## 2020-08-09 ENCOUNTER — Ambulatory Visit: Payer: Medicare PPO | Admitting: Radiation Oncology

## 2020-08-09 ENCOUNTER — Ambulatory Visit: Payer: Medicare PPO

## 2020-08-09 NOTE — Telephone Encounter (Signed)
CALLED PATIENT TO ASK ABOUT RESCHEDULING LAB AND FU APPT. FOR 08-09-20, RESCHEDULED FOR 08-16-20 - LAB @ 10 AM AND FU WITH DR. Isidore Moos ON 08-16-20 @ 10:20 AM, PATIENT AGREED TO ALL APPTS.

## 2020-08-16 ENCOUNTER — Ambulatory Visit
Admission: RE | Admit: 2020-08-16 | Discharge: 2020-08-16 | Disposition: A | Discharge status: Home / Self Care | Payer: Non-veteran care | Source: Ambulatory Visit | Attending: Radiation Oncology | Admitting: Radiation Oncology

## 2020-08-16 ENCOUNTER — Other Ambulatory Visit: Payer: Self-pay

## 2020-08-16 ENCOUNTER — Ambulatory Visit
Admission: RE | Admit: 2020-08-16 | Discharge: 2020-08-16 | Disposition: A | Discharge status: Home / Self Care | Payer: Medicare Other | Source: Ambulatory Visit | Attending: Radiation Oncology | Admitting: Radiation Oncology

## 2020-08-16 DIAGNOSIS — Z7984 Long term (current) use of oral hypoglycemic drugs: Secondary | ICD-10-CM | POA: Diagnosis not present

## 2020-08-16 DIAGNOSIS — Z923 Personal history of irradiation: Secondary | ICD-10-CM | POA: Insufficient documentation

## 2020-08-16 DIAGNOSIS — Z79899 Other long term (current) drug therapy: Secondary | ICD-10-CM | POA: Diagnosis not present

## 2020-08-16 DIAGNOSIS — Z85819 Personal history of malignant neoplasm of unspecified site of lip, oral cavity, and pharynx: Secondary | ICD-10-CM | POA: Insufficient documentation

## 2020-08-16 DIAGNOSIS — R1310 Dysphagia, unspecified: Secondary | ICD-10-CM | POA: Insufficient documentation

## 2020-08-16 DIAGNOSIS — Z7982 Long term (current) use of aspirin: Secondary | ICD-10-CM | POA: Diagnosis not present

## 2020-08-16 DIAGNOSIS — Z1329 Encounter for screening for other suspected endocrine disorder: Secondary | ICD-10-CM

## 2020-08-16 DIAGNOSIS — C040 Malignant neoplasm of anterior floor of mouth: Secondary | ICD-10-CM

## 2020-08-16 DIAGNOSIS — Z794 Long term (current) use of insulin: Secondary | ICD-10-CM | POA: Insufficient documentation

## 2020-08-16 LAB — TSH: TSH: 1.055 u[IU]/mL (ref 0.320–4.118)

## 2020-08-16 MED ORDER — SCOPOLAMINE 1 MG/3DAYS TD PT72
1.0000 | MEDICATED_PATCH | TRANSDERMAL | 5 refills | Status: AC
Start: 2020-08-16 — End: ?

## 2020-08-16 NOTE — Progress Notes (Signed)
Radiation Oncology         [phone] ________________________________  Name: [name] MRN: [id number]  Date: 08/16/2020  DOB: 02-Apr-1960  Follow-Up Visit Note  CC: Clinic, [facility], [facility]  Diagnosis and Prior Radiotherapy:       ICD-10-CM   1. 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 Kaufhold presents today for follow-up after completing radiation to his floor of mouth on 09/27/2019  Pain issues, if any: Continues to deal with discomfort to the right side of his neck that was drained back in March. Using a feeding tube?: Yes--continues to use about 3-4 times a day (instills 7-8 cartons daily).  Weight changes, if any:  Wt Readings from Last 3 Encounters:  08/16/20 129 lb (58.5 kg)  06/08/20 132 lb 4.4 oz (60 kg)  02/16/20 131 lb 9.6 oz (59.7 kg)   Swallowing issues, if any: Denies, but can still only tolerate liquids and softer foods (uses PEG primarily for nutrition) Smoking or chewing tobacco? None Using fluoride trays daily? N/A Last ENT visit was on: 05/02/2020 Saw Dr. Harriett Rush [place]:  "--No concerning masses noted on exam --NED --SLP visit on 10/2019 at [facility] - diet per SLP recs --continue PT for shoulder --f/up in 6 months --obtain CT neck and chest in 6 months --continue follow up with Dr.Karli Wickizer"  Other notable issues, if any: Had an I&D on right mandible done in the ED on 06/08/2020. State he continues to deal with drainage  and occasional swelling to site as well. Reports area feels firm, and drainage ranges from clear/blood-tinged/milky. Denies any fevers or radiating pain from area. Reports intermittent ringing in his right ear. States the right side of his jaw feels tight and sometimes restricts how far he can open his mouth. Denies any sores or ulcers to his mouth or tongue. Reports fatigue has resolved and denies any difficulty sleeping at night. He is trying to get evaluated by a dentist/oral surgeon for dental concerns, but states he is currently on a 4-6 month waiting list  Vitals:   08/16/20 1008  BP: 115/69  Pulse: 85  Resp: 18  Temp: (!) 97.2 F (36.2 C)  SpO2: 100%   ALLERGIES:  is allergic to morphine and related.  Meds: Current Outpatient Medications  Medication Sig Dispense Refill  . aspirin EC 81 MG EC tablet Take 1 tablet (81 mg total) by mouth daily. 30 tablet 3  . atorvastatin (LIPITOR) 80 MG tablet Take 80 mg by mouth at bedtime.     . clopidogrel (PLAVIX) 75 MG tablet Take 75 mg by mouth daily.     . cyclobenzaprine (FLEXERIL) 10 MG tablet Take 10 mg by mouth 3 (three) times daily as needed for muscle spasms.     . fluconazole (DIFLUCAN) 40 MG/ML suspension Take 5 mL today, then, 2.21mL daily for 20 more days. 55 mL 0  . glipiZIDE (GLUCOTROL) 5 MG tablet Take 5 mg by mouth 2 (two) times daily before a meal.     .  insulin detemir (LEVEMIR) 100 UNIT/ML FlexPen Inject 12 Units into the skin daily. May substitute for Lantus FlexPen if Levemir is not available 15 mL 3  . insulin isophane & regular human (HUMULIN 70/30 MIX) (70-30) 100 UNIT/ML KwikPen Dose based on SSI coverage chart provided to patient by clinic 15 mL 11  . lidocaine (XYLOCAINE) 2 % solution Patient: Mix 1part 2% viscous lidocaine, 1part H20. Swish & swallow 22mL of diluted mixture, 71min before meals and at bedtime, up to QID (Patient not taking: Reported on 12/08/2019) 200 mL 2  . magnesium oxide (MAG-OX) 400 MG tablet Take 400  mg by mouth 2 (two) times daily.     . metFORMIN (GLUCOPHAGE) 1000 MG tablet Take 1,000 mg by mouth 2 (two) times daily.    . metoprolol tartrate (LOPRESSOR) 50 MG tablet Take 50 mg by mouth in the morning and at bedtime.     . phenytoin (DILANTIN) 125 MG/5ML suspension Take 4 mLs (100 mg total) by mouth 3 (three) times daily. 237 mL 2  . scopolamine (TRANSDERM-SCOP) 1 MG/3DAYS Place 1 patch (1.5 mg total) onto the skin every 3 (three) days. 10 patch 5  . sodium fluoride (PREVIDENT 5000 PLUS) 1.1 % CREA dental cream Apply thin ribbon of cream to tooth brush. Brush teeth for 2 minutes. Spit out excess-DO NOT swallow. DO NOT rinse afterwards. Repeat nightly. (Patient not taking: Reported on 12/08/2019) 1 Tube prn  . valproic acid (DEPAKENE) 250 MG/5ML solution Take 10 mLs (500 mg total) by mouth in the morning and at bedtime. 600 mL 2  . Water For Irrigation, Sterile (FREE WATER) SOLN Place 120 mLs into feeding tube 5 (five) times daily.     No current facility-administered medications for this encounter.    Physical Findings: The patient is in no acute distress. Patient is alert and oriented. Wt Readings from Last 3 Encounters:  08/16/20 129 lb (58.5 kg)  06/08/20 132 lb 4.4 oz (60 kg)  02/16/20 131 lb 9.6 oz (59.7 kg)    weight is 129 lb (58.5 kg). His temporal temperature is 97.2 F (36.2 C) (abnormal). His blood pressure is 115/69 and his pulse is 85. His respiration is 18 and oxygen saturation is 100%. .  General: Alert and oriented, in no acute distress, ambulatory HEENT: Oral cavity and oropharynx demonstrate no thrush and no sign of recurrence.   Neck:   There is fibrosis in the upper neck and submental region. Right submental mass draining milky discharge. MSK: muscle wasting diffusely, ambulatory. Psychiatric: Pleasant affect, alert and oriented   Lab Findings: Lab Results  Component Value Date   WBC 12.2 (H) 06/08/2020   HGB 12.4 (L) 06/08/2020   HCT 39.1 06/08/2020   MCV  87.7 06/08/2020   PLT 290 06/08/2020    Lab Results  Component Value Date   TSH 1.055 08/16/2020    Radiographic Findings: No results found.  Impression/Plan:    1) Head and Neck Cancer Status: No evidence of disease  2) Nutritional Status: Weight is stable; patient is largely reliant on PEG tube.  Expresses reluctance to take food by mouth.  Has been followed closely by swallowing therapy nutritionist Wt Readings from Last 3 Encounters:  08/16/20 129 lb (58.5 kg)  06/08/20 132 lb 4.4 oz (60 kg)  02/16/20 131 lb 9.6 oz (59.7 kg)    3) Risk Factors: The patient has been educated about risk factors including alcohol and tobacco abuse; they understand that avoidance of alcohol and tobacco is important  to prevent recurrences as well as other cancers   4) Swallowing: dysphagia: continue SLP recommendations  5) Dental: Has dental issues; cannot find a dentist to see him quickly for medically necessary treatment/evaluation.  I asked Juliette Mangle to seek [facility] approval for pt to see Dr Benson Norway.    6) Thyroid function: check annually. WNL today Lab Results  Component Value Date   TSH 1.055 08/16/2020   7) Copious secretions: scopolamine refilled  8) Pt needs ENT f/u for neck mass. See below:  NOTE SENT TO H+N NAVIGATOR:  Hi, Anderson Malta, can you get pt back in w/ [facility] ASAP?  Let team know pt was seen in ED in March and had I+D for neck mass but they did not send for cytology as far as I see-- mass persists after ABX.     Also, pt has dental issues  - I would like [facility] o see him.  Can you call Dr Edison Nasuti as she may want to make the referral for [facility] approval? I would like [facility] to see him  as pt is at risk of osteoradionecrosis. This is less urgent than Patwa. Need to rule out recurrence, first.    Thanks SS  On date of service, in total, I spent 25 minutes on this encounter. Patient was seen in person. _____________________________________   Eppie Gibson, MD

## 2020-08-16 NOTE — Progress Notes (Signed)
[name] presents today for follow-up after completing radiation to his floor of mouth on 09/27/2019  Pain issues, if any: Continues to deal with discomfort to the right side of his neck that was drained back in March. Using a feeding tube?: Yes--continues to use about 3-4 times a day (instills 7-8 cartons daily).  Weight changes, if any:  Wt Readings from Last 3 Encounters:  08/16/20 129 lb (58.5 kg)  06/08/20 132 lb 4.4 oz (60 kg)  02/16/20 131 lb 9.6 oz (59.7 kg)   Swallowing issues, if any: Denies, but can still only tolerate liquids and softer foods (uses PEG primarily for nutrition) Smoking or chewing tobacco? None Using fluoride trays daily? N/A Last ENT visit was on: 05/02/2020 Saw Dr. Harriett Rush [place]:  "--No concerning masses noted on exam --NED --SLP visit on 10/2019 at [facility] - diet per SLP recs --continue PT for shoulder --f/up in 6 months --obtain CT neck and chest in 6 months --continue follow up with Dr.Squire"  Other notable issues, if any: Had an I&D on right mandible done in the ED on 06/08/2020. State he continues to deal with drainage and occasional swelling to site as well. Reports area feels firm, and drainage ranges from clear/blood-tinged/milky. Denies any fevers or radiating pain from area. Reports intermittent ringing in his right ear. States the right side of his jaw feels tight and sometimes restricts how far he can open his mouth. Denies any sores or ulcers to his mouth or tongue. Reports fatigue has resolved and denies any difficulty sleeping at night. He is trying to get evaluated by a dentist/oral surgeon for dental concerns, but states he is currently on a 4-6 month waiting list

## 2020-08-19 ENCOUNTER — Encounter: Payer: Self-pay | Admitting: Radiation Oncology

## 2020-08-19 NOTE — Progress Notes (Signed)
Oncology Nurse Navigator Documentation  At the request of Dr. Isidore Moos for Mr. Bielefeld to be seen by Dr. Hendricks Limes at [facility] as soon as possible, I have scheduled him for 08/22/20 at 8:15. I called and informed Mr. Macomber of the appointment and is was agreeable. He knows to call me if he has any further questions or concerns.   Harlow Asa RN, BSN, OCN Head & Neck Oncology Nurse [place] at [facility] Phone # [phone]  Fax # [phone]

## 2021-02-20 ENCOUNTER — Other Ambulatory Visit: Payer: Self-pay

## 2021-02-20 ENCOUNTER — Telehealth: Payer: Self-pay | Admitting: *Deleted

## 2021-02-20 DIAGNOSIS — Z1329 Encounter for screening for other suspected endocrine disorder: Secondary | ICD-10-CM

## 2021-02-20 NOTE — Progress Notes (Signed)
[name] presents today for follow-up after completing radiation to his floor of mouth on 09/27/2019  Pain issues, if any: Patient denies Using a feeding tube?: Yes--instills 7-8 cartons of [employer] 1.4 daily; Eats small amounts of solid food throughout the day Weight changes, if any:  Wt Readings from Last 3 Encounters:  02/21/21 127 lb 9.6 oz (57.9 kg)  08/16/20 129 lb (58.5 kg)  06/08/20 132 lb 4.4 oz (60 kg)   Swallowing issues, if any: Patient denies, but currently receiving most nutrition via PEG because of persistent altered sense of taste (reports little appetite, so he has to make himself eat in between PEG feedings) Smoking or chewing tobacco? None Using fluoride trays daily? N/A Last ENT visit was on: 01/30/2021 Saw Dr. Harriett Rush [place]  "1. 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 Neck wound likely 2/2 ORN vs hardware infection.  - this is likely infectious in origin given clinical course, though cannot rule out malignancy.  - he has experienced chronic infection over the past few months and now has exposed plate intraorally. Upon review of his recent CT neck, there is evidence of destruction of native mandibular body concerning for osteonecrosis/osteomyelitis; though cannot rule out malignancy.  He is s/p removal of his mandibular hardware, mandibular bone debridement His neck/face now has a small area of skin dehiscence. 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  - continue IV antibiotics for treatment for osteomyelitis (Dr.Morse at the [place]) - continue follow up with Dr.Squire 3. Sialorrhea - I suspect this is a combination of poor sensation at the floor of mouth/lips and poor swallow.  - This is also affecting his speech as his speech is cleared when we suction out the secretions.  - we discussed starting him on glycopyrrolate for 1  week with consideration of botulinum injection if he has improvement while on glycopyrrolate. However, this drug appears to have contraindications to his underlying disease processes. Will see if he can check with his PCP/cardiologist. He states that his cardiologist has cleared him. I talked to Dr.Jacobs and per their records he has not been seen by cardiology in a while. I have asked Dr.Jacobs to check with his PCP to see if glyocopyrolate would be ok for [place] to use. She will get back to me - he is agreement with the plan   F/up in 3 months or sooner if botox is considered"  Other notable issues, if any: States he has not been able to pick up saliva management medication from [place], so he continues to struggle with copious saliva production. Continues to experience drainage from firm nodule on right side of jaw (reports drainage is clear). Also reports numbness to the right side of his chin, lower lip, right jaw, and up the right side of his head. Reports a new pain to the inside of his left ear (states it mainly occurs when he yawns).

## 2021-02-20 NOTE — Telephone Encounter (Signed)
CALLED PATIENT TO ASK ABOUT COMING IN TOMORROW FOR LAB @ 9:30 AM, SPOKE WITH PATIENT'S WIFE- [name] AND SHE AGREED TO THIS APPT.

## 2021-02-21 ENCOUNTER — Ambulatory Visit
Admission: RE | Admit: 2021-02-21 | Discharge: 2021-02-21 | Disposition: A | Discharge status: Home / Self Care | Payer: Medicare Other | Source: Ambulatory Visit | Attending: Radiation Oncology | Admitting: Radiation Oncology

## 2021-02-21 ENCOUNTER — Other Ambulatory Visit: Payer: Self-pay

## 2021-02-21 VITALS — BP 91/63 | HR 74 | Temp 97.9°F | Resp 20 | Ht 70.0 in | Wt 127.6 lb

## 2021-02-21 DIAGNOSIS — Z923 Personal history of irradiation: Secondary | ICD-10-CM | POA: Diagnosis not present

## 2021-02-21 DIAGNOSIS — Z79899 Other long term (current) drug therapy: Secondary | ICD-10-CM | POA: Insufficient documentation

## 2021-02-21 DIAGNOSIS — Z7982 Long term (current) use of aspirin: Secondary | ICD-10-CM | POA: Diagnosis not present

## 2021-02-21 DIAGNOSIS — R200 Anesthesia of skin: Secondary | ICD-10-CM | POA: Diagnosis not present

## 2021-02-21 DIAGNOSIS — Z85819 Personal history of malignant neoplasm of unspecified site of lip, oral cavity, and pharynx: Secondary | ICD-10-CM | POA: Diagnosis present

## 2021-02-21 DIAGNOSIS — Z1329 Encounter for screening for other suspected endocrine disorder: Secondary | ICD-10-CM

## 2021-02-21 DIAGNOSIS — C040 Malignant neoplasm of anterior floor of mouth: Secondary | ICD-10-CM

## 2021-02-21 DIAGNOSIS — R1310 Dysphagia, unspecified: Secondary | ICD-10-CM | POA: Diagnosis not present

## 2021-02-21 DIAGNOSIS — K117 Disturbances of salivary secretion: Secondary | ICD-10-CM | POA: Diagnosis not present

## 2021-02-21 DIAGNOSIS — Z7984 Long term (current) use of oral hypoglycemic drugs: Secondary | ICD-10-CM | POA: Insufficient documentation

## 2021-02-21 LAB — TSH: TSH: 0.928 u[IU]/mL (ref 0.320–4.118)

## 2021-02-22 NOTE — Progress Notes (Addendum)
Radiation Oncology         [phone] ________________________________  Name: [name] MRN: [id number]  Date: 02/21/2021  DOB: April 07, 1960  Follow-Up Visit Note  CC: [facility]  Diagnosis and Prior Radiotherapy:       ICD-10-CM   1. 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 ([place]) Staging form: Oral Cavity, AJCC 8th Edition - Pathologic stage from 08/08/2019: Stage IVA (pT4a, pN1, cM0) - Signed by Eppie Gibson, MD on 08/08/2019 Stage prefix: Initial diagnosis Presence of extranodal extension: Absent Depth of invasion (mm): 14 Perineural invasion (PNI): Present   CHIEF COMPLAINT:  Here for follow-up and surveillance of floor of mouth cancer  Narrative:      [name] presents today for follow-up after completing radiation to his floor of mouth on 09/27/2019  Pain issues, if any: Patient denies Using a feeding tube?: Yes--instills 7-8 cartons of [employer] 1.4 daily; Eats small amounts of solid food throughout the day Weight changes, if any:  Wt Readings from Last 3 Encounters:  02/21/21 127 lb 9.6 oz (57.9 kg)  08/16/20 129 lb (58.5 kg)  06/08/20 132 lb 4.4 oz (60 kg)   Swallowing issues, if any: Patient denies, but currently receiving most nutrition via PEG because of persistent altered sense of taste (reports little appetite, so he has to make himself eat in between PEG feedings) Smoking or chewing tobacco? None Using fluoride trays daily? N/A Last ENT visit was on: 01/30/2021 Saw Dr. Harriett Rush [place]  "1. 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 Neck wound likely 2/2 ORN vs hardware infection.  - this is likely infectious in origin given clinical course, though cannot rule out malignancy.  - he has experienced  chronic infection over the past few months and now has exposed plate intraorally. Upon review of his recent CT neck, there is evidence of destruction of native mandibular body concerning for osteonecrosis/osteomyelitis; though cannot rule out malignancy.  He is s/p removal of his mandibular hardware, mandibular bone debridement His neck/face now has a small area of skin dehiscence. 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  - continue IV antibiotics for treatment for osteomyelitis (Dr.Morse at the [place]) - continue follow up with Dr.Dannel Rafter 3. Sialorrhea - I suspect this is a combination of poor sensation at the floor of mouth/lips and poor swallow.  - This is also affecting his speech as his speech is cleared when we suction out the secretions.  - we discussed starting him on glycopyrrolate for 1 week with consideration of botulinum injection if he has improvement while on glycopyrrolate. However, this drug appears to have contraindications to his underlying disease processes. Will see if he can check with his PCP/cardiologist. He states that his cardiologist has cleared him. I talked to Dr.Jacobs and per their records he has not been seen by cardiology in a while. I have asked Dr.Jacobs to check with his PCP to see if glyocopyrolate would be ok for [place] to use. She will get back to me - he is agreement with the plan   F/up in 3 months or sooner if botox is considered"  Other notable issues, if any: States he has not been able to pick up  saliva management medication from [place], so he continues to struggle with copious saliva production. Continues to experience drainage from firm nodule on right side of jaw (reports drainage is clear). Also reports numbness to the right side of his chin, lower lip, right jaw, and up the right side of his head. Reports a new pain to the inside of his left ear (states it mainly occurs when he yawns).    ALLERGIES:  is allergic to morphine and related.  Meds: Current  Outpatient Medications  Medication Sig Dispense Refill   aspirin EC 81 MG EC tablet Take 1 tablet (81 mg total) by mouth daily. 30 tablet 3   atorvastatin (LIPITOR) 80 MG tablet Take 80 mg by mouth at bedtime.      clopidogrel (PLAVIX) 75 MG tablet Take 75 mg by mouth daily.      cyclobenzaprine (FLEXERIL) 10 MG tablet Take 10 mg by mouth 3 (three) times daily as needed for muscle spasms.      fluconazole (DIFLUCAN) 40 MG/ML suspension Take 5 mL today, then, 2.57mL daily for 20 more days. 55 mL 0   glipiZIDE (GLUCOTROL) 5 MG tablet Take 5 mg by mouth 2 (two) times daily before a meal.      insulin detemir (LEVEMIR) 100 UNIT/ML FlexPen Inject 12 Units into the skin daily. May substitute for Lantus FlexPen if Levemir is not available 15 mL 3   insulin isophane & regular human (HUMULIN 70/30 MIX) (70-30) 100 UNIT/ML KwikPen Dose based on SSI coverage chart provided to patient by clinic 15 mL 11   lidocaine (XYLOCAINE) 2 % solution Patient: Mix 1part 2% viscous lidocaine, 1part H20. Swish & swallow 64mL of diluted mixture, 5min before meals and at bedtime, up to QID (Patient not taking: Reported on 12/08/2019) 200 mL 2   magnesium oxide (MAG-OX) 400 MG tablet Take 400 mg by mouth 2 (two) times daily.      metFORMIN (GLUCOPHAGE) 1000 MG tablet Take 1,000 mg by mouth 2 (two) times daily.     metoprolol tartrate (LOPRESSOR) 50 MG tablet Take 50 mg by mouth in the morning and at bedtime.      phenytoin (DILANTIN) 125 MG/5ML suspension Take 4 mLs (100 mg total) by mouth 3 (three) times daily. 237 mL 2   scopolamine (TRANSDERM-SCOP) 1 MG/3DAYS Place 1 patch (1.5 mg total) onto the skin every 3 (three) days. 10 patch 5   sodium fluoride (PREVIDENT 5000 PLUS) 1.1 % CREA dental cream Apply thin ribbon of cream to tooth brush. Brush teeth for 2 minutes. Spit out excess-DO NOT swallow. DO NOT rinse afterwards. Repeat nightly. (Patient not taking: Reported on 12/08/2019) 1 Tube prn   valproic acid (DEPAKENE) 250  MG/5ML solution Take 10 mLs (500 mg total) by mouth in the morning and at bedtime. 600 mL 2   Water For Irrigation, Sterile (FREE WATER) SOLN Place 120 mLs into feeding tube 5 (five) times daily.     No current facility-administered medications for this encounter.    Physical Findings: The patient is in no acute distress. Patient is alert and oriented. Wt Readings from Last 3 Encounters:  02/21/21 127 lb 9.6 oz (57.9 kg)  08/16/20 129 lb (58.5 kg)  06/08/20 132 lb 4.4 oz (60 kg)    height is 5\' 10"  (1.778 m) and weight is 127 lb 9.6 oz (57.9 kg). His temperature is 97.9 F (36.6 C). His blood pressure is 91/63 and his pulse is 74. His respiration is 20 and oxygen saturation  is 100%. .  General: Alert and oriented, in no acute distress, ambulatory HEENT: Oral cavity and oropharynx demonstrate no thrush and no sign of recurrence.   Tympanic membranes without bulge or exudate or erythema. Copious saliva. Neck:   There is fibrosis in the upper neck and submental region. Right submental region no longer draining - there is overlying crusting on skin. No adenopathy palpated MSK: muscle wasting diffusely, ambulatory. Psychiatric: Pleasant affect, alert and oriented Heart RRR Chest CTAB   Lab Findings: Lab Results  Component Value Date   WBC 12.2 (H) 06/08/2020   HGB 12.4 (L) 06/08/2020   HCT 39.1 06/08/2020   MCV 87.7 06/08/2020   PLT 290 06/08/2020    Lab Results  Component Value Date   TSH 0.928 02/21/2021    Radiographic Findings: No results found.  Impression/Plan:    1) Head and Neck Cancer Status: No evidence of disease  2) Nutritional Status: Weight is slightly reduced; patient is largely reliant on PEG tube.    Wt Readings from Last 3 Encounters:  02/21/21 127 lb 9.6 oz (57.9 kg)  08/16/20 129 lb (58.5 kg)  06/08/20 132 lb 4.4 oz (60 kg)    3) Risk Factors: The patient has been educated about risk factors including alcohol and tobacco abuse; they understand that  avoidance of alcohol and tobacco is important to prevent recurrences as well as other cancers   4) Swallowing: dysphagia: continue SLP recommendations  5) Dental: Has dental issues; cannot find a dentist to see him quickly for medically necessary treatment/evaluation.  I asked Anderson Malta RN to again seek dental options for him today that [place] will approve.  6) Thyroid function: check annually. WNL today Lab Results  Component Value Date   TSH 0.928 02/21/2021   7) Copious secretions: scopolamine previously tried. Dr. Hendricks Limes recommended glycopyrrolate but was going to get PCP approval- pt did not yet receive Rx. Anderson Malta looking into this w/ [facility] navigator   8) Osteomyelitis: IV ABX with Dr. Leamon Arnt? Mention in Dr. Lavonia Dana note, patient unaware of appts. Anderson Malta [facility].  9) Followup: with me in 68mo, sooner PRN.  On date of service, in total, I spent 35 minutes on this encounter. Patient was seen in person. _____________________________________   Eppie Gibson, MD

## 2021-03-04 ENCOUNTER — Encounter (HOSPITAL_COMMUNITY): Payer: Self-pay

## 2021-03-04 ENCOUNTER — Ambulatory Visit (HOSPITAL_COMMUNITY)
Admission: EM | Admit: 2021-03-04 | Discharge: 2021-03-04 | Disposition: A | Discharge status: Home / Self Care | Payer: No Typology Code available for payment source | Attending: Urgent Care | Admitting: Urgent Care

## 2021-03-04 ENCOUNTER — Other Ambulatory Visit: Payer: Self-pay

## 2021-03-04 DIAGNOSIS — Z794 Long term (current) use of insulin: Secondary | ICD-10-CM | POA: Insufficient documentation

## 2021-03-04 DIAGNOSIS — E118 Type 2 diabetes mellitus with unspecified complications: Secondary | ICD-10-CM

## 2021-03-04 DIAGNOSIS — R350 Frequency of micturition: Secondary | ICD-10-CM | POA: Insufficient documentation

## 2021-03-04 DIAGNOSIS — R300 Dysuria: Secondary | ICD-10-CM | POA: Insufficient documentation

## 2021-03-04 DIAGNOSIS — E119 Type 2 diabetes mellitus without complications: Secondary | ICD-10-CM | POA: Diagnosis present

## 2021-03-04 LAB — POCT URINALYSIS DIPSTICK, ED / UC
Bilirubin Urine: NEGATIVE
Glucose, UA: 500 mg/dL — AB
Hgb urine dipstick: NEGATIVE
Leukocytes,Ua: NEGATIVE
Nitrite: NEGATIVE
Protein, ur: NEGATIVE mg/dL
Specific Gravity, Urine: 1.025 (ref 1.005–1.030)
Urobilinogen, UA: 0.2 mg/dL (ref 0.0–1.0)
pH: 5.5 (ref 5.0–8.0)

## 2021-03-04 NOTE — Discharge Instructions (Signed)
Make sure you hydrate very well with plain water and a quantity of 64 ounces of water a day.  Please limit drinks that are considered urinary irritants such as soda, sweet tea, coffee, energy drinks, alcohol.  These can worsen your urinary and genital symptoms but also be the source of them.  I will let you know about your urine culture and penile swab results through MyChart to see if we need to prescribe or change your antibiotics based off of those results.

## 2021-03-04 NOTE — ED Provider Notes (Signed)
[facility]   MRN: [id number] DOB: Dec 05, 1960  Subjective:   [name] is a 60 y.o. male presenting for 1 week history of persistent dysuria, urinary frequency. Denies hematuria, penile discharge, penile swelling, testicular pain, testicular swelling, anal pain, groin pain.  Patient is sexually active with 1 male, uses condoms regularly.  Has type 2 diabetes, treated with insulin.  Patient admits that he drinks alcohol regularly including liquor and beer daily.  Does not hydrate very well with water.   No current facility-administered medications for this encounter.  Current Outpatient Medications:    aspirin EC 81 MG EC tablet, Take 1 tablet (81 mg total) by mouth daily., Disp: 30 tablet, Rfl: 3   atorvastatin (LIPITOR) 80 MG tablet, Take 80 mg by mouth at bedtime. , Disp: , Rfl:    clopidogrel (PLAVIX) 75 MG tablet, Take 75 mg by mouth daily. , Disp: , Rfl:    cyclobenzaprine (FLEXERIL) 10 MG tablet, Take 10 mg by mouth 3 (three) times daily as needed for muscle spasms. , Disp: , Rfl:    fluconazole (DIFLUCAN) 40 MG/ML suspension, Take 5 mL today, then, 2.69mL daily for 20 more days., Disp: 55 mL, Rfl: 0   glipiZIDE (GLUCOTROL) 5 MG tablet, Take 5 mg by mouth 2 (two) times daily before a meal. , Disp: , Rfl:    insulin detemir (LEVEMIR) 100 UNIT/ML FlexPen, Inject 12 Units into the skin daily. May substitute for Lantus FlexPen if Levemir is not available, Disp: 15 mL, Rfl: 3   insulin isophane & regular human (HUMULIN 70/30 MIX) (70-30) 100 UNIT/ML KwikPen, Dose based on SSI coverage chart provided to patient by clinic, Disp: 15 mL, Rfl: 11   lidocaine (XYLOCAINE) 2 % solution, Patient: Mix 1part 2% viscous lidocaine, 1part H20. Swish & swallow 68mL of diluted mixture, 66min before meals and at bedtime, up to QID (Patient not taking: Reported on 12/08/2019), Disp: 200 mL, Rfl: 2   magnesium oxide (MAG-OX) 400 MG tablet, Take 400 mg by mouth 2 (two) times daily.  , Disp: , Rfl:    metFORMIN (GLUCOPHAGE) 1000 MG tablet, Take 1,000 mg by mouth 2 (two) times daily., Disp: , Rfl:    metoprolol tartrate (LOPRESSOR) 50 MG tablet, Take 50 mg by mouth in the morning and at bedtime. , Disp: , Rfl:    phenytoin (DILANTIN) 125 MG/5ML suspension, Take 4 mLs (100 mg total) by mouth 3 (three) times daily., Disp: 237 mL, Rfl: 2   scopolamine (TRANSDERM-SCOP) 1 MG/3DAYS, Place 1 patch (1.5 mg total) onto the skin every 3 (three) days., Disp: 10 patch, Rfl: 5   sodium fluoride (PREVIDENT 5000 PLUS) 1.1 % CREA dental cream, Apply thin ribbon of cream to tooth brush. Brush teeth for 2 minutes. Spit out excess-DO NOT swallow. DO NOT rinse afterwards. Repeat nightly. (Patient not taking: Reported on 12/08/2019), Disp: 1 Tube, Rfl: prn   valproic acid (DEPAKENE) 250 MG/5ML solution, Take 10 mLs (500 mg total) by mouth in the morning and at bedtime., Disp: 600 mL, Rfl: 2   Water For Irrigation, Sterile (FREE WATER) SOLN, Place 120 mLs into feeding tube 5 (five) times daily., Disp:  , Rfl:    Allergies  Allergen Reactions   Morphine And Related Itching    Past Medical History:  Diagnosis Date   Anxiety    Cancer ([place])    mouth   Chronic pancreatitis ([place])    Coronary artery disease    Depression  Diabetes mellitus without complication (HCC)    Hyperlipidemia    Hypertension    Myocardial infarction ([place])    Neuromuscular disorder ([place])    Sciatica    Seizures ([place])    Stroke ([place])    Substance abuse ([place])      Past Surgical History:  Procedure Laterality Date   BACK SURGERY     CORONARY STENT PLACEMENT     GASTROSTOMY TUBE PLACEMENT Left 07/31/2019   [facility]   LEFT HEART CATHETERIZATION WITH CORONARY ANGIOGRAM N/A 10/01/2011   Procedure: LEFT HEART CATHETERIZATION WITH CORONARY ANGIOGRAM;  Surgeon: Clent Demark, MD;  Location: [facility] CATH LAB;  Service: Cardiovascular;  Laterality: N/A;   LEFT HEART CATHETERIZATION WITH CORONARY ANGIOGRAM N/A 03/06/2013    Procedure: LEFT HEART CATHETERIZATION WITH CORONARY ANGIOGRAM;  Surgeon: Clent Demark, MD;  Location: [facility] CATH LAB;  Service: Cardiovascular;  Laterality: N/A;   PERCUTANEOUS CORONARY STENT INTERVENTION (PCI-S) N/A 10/02/2011   Procedure: PERCUTANEOUS CORONARY STENT INTERVENTION (PCI-S);  Surgeon: Clent Demark, MD;  Location[facility] C[facility] LAB;  Service: Cardiovascular;  Laterality: N/A;   TRACHEOSTOMY CLOSURE      Family History  Family history unknown: Yes    Social History   Tobacco Use   Smoking status: Former    Packs/day: 1.00    Years: 40.00    Pack years: 40.00    Types: Cigarettes   Smokeless tobacco: Never  Vaping Use   Vaping Use: Never used  Substance Use Topics   Alcohol use: Not Currently   Drug use: Never    ROS   Objective:   Vitals: BP 124/80 (BP Location: Right Arm)    Pulse (!) 104    Temp 97.8 F (36.6 C) (Oral)    Resp 18    SpO2 98%   Physical Exam Constitutional:      General: He is not in acute distress.    Appearance: Normal appearance. He is well-developed and normal weight. He is not ill-appearing, toxic-appearing or diaphoretic.  HENT:     Head: Normocephalic and atraumatic.     Right Ear: External ear normal.     Left Ear: External ear normal.     Nose: Nose normal.     Mouth/Throat:     Pharynx: Oropharynx is clear.  Eyes:     General: No scleral icterus.       Right eye: No discharge.        Left eye: No discharge.     Extraocular Movements: Extraocular movements intact.     Pupils: Pupils are equal, round, and reactive to light.  Cardiovascular:     Rate and Rhythm: Normal rate.  Pulmonary:     Effort: Pulmonary effort is normal.  Abdominal:     General: Bowel sounds are normal. There is no distension.     Palpations: Abdomen is soft. There is no mass.     Tenderness: There is no abdominal tenderness. There is no right CVA tenderness, left CVA tenderness, guarding or rebound.  Musculoskeletal:     Cervical back: Normal range  of motion.  Neurological:     Mental Status: He is alert and oriented to person, place, and time.  Psychiatric:        Mood and Affect: Mood normal.        Behavior: Behavior normal.        Thought Content: Thought content normal.        Judgment: Judgment normal.    Results for orders placed  or performed during the hospital encounter of 03/04/21 (from the past 24 hour(s))  POC Urinalysis dipstick     Status: Abnormal   Collection Time: 03/04/21  9:46 AM  Result Value Ref Range   Glucose, UA 500 (A) NEGATIVE mg/dL   Bilirubin Urine NEGATIVE NEGATIVE   Ketones, ur TRACE (A) NEGATIVE mg/dL   Specific Gravity, Urine 1.025 1.005 - 1.030   Hgb urine dipstick NEGATIVE NEGATIVE   pH 5.5 5.0 - 8.0   Protein, ur NEGATIVE NEGATIVE mg/dL   Urobilinogen, UA 0.2 0.0 - 1.0 mg/dL   Nitrite NEGATIVE NEGATIVE   Leukocytes,Ua NEGATIVE NEGATIVE    Assessment and Plan :   PDMP not reviewed this encounter.  1. Urinary frequency   2. Dysuria   3. Type 2 diabetes mellitus treated with insulin (HCC)    Urine culture and STI test results pending.  Emphasized need to cut back on his alcohol use especially because of his diabetes and history of alcoholic pancreatitis.  I suspect this was the primary reason for his dysuria and urinary frequency but will treat based off of lab results. Counseled patient on potential for adverse effects with medications prescribed/recommended today, ER and return-to-clinic precautions discussed, patient verbalized understanding.    Jaynee Eagles, [place] 03/04/21 [phone]

## 2021-03-04 NOTE — ED Triage Notes (Signed)
Pt presents with frequent urination and penile discomfort X 1 week.

## 2021-03-05 LAB — URINE CULTURE: Culture: NO GROWTH

## 2021-03-05 LAB — CYTOLOGY, (ORAL, ANAL, URETHRAL) ANCILLARY ONLY
Chlamydia: NEGATIVE
Comment: NEGATIVE
Comment: NEGATIVE
Comment: NORMAL
Neisseria Gonorrhea: NEGATIVE
Trichomonas: NEGATIVE

## 2021-07-22 NOTE — Progress Notes (Signed)
Oncology Nurse Navigator Documentation  ? ?I left a voice mail with [name] today to follow up with his request for dental care. I have contacted the [facility] by sending a referral request in for him to see the dentist at the [facility] [place] but have not gotten confirmation that he has been approved. I offered him an appointment with the [facility] dentist in mid June and also encouraged him to call dentists in the community for assistance. I provided him with my direct contact information to call me back as needed.  ? ?Harlow Asa RN, BSN, OCN ?Head & Neck Oncology Nurse Navigator ?[place] at [facility] ?Phone # [phone]  ?Fax # [phone]  ?

## 2021-08-16 ENCOUNTER — Other Ambulatory Visit: Payer: Self-pay

## 2021-08-16 ENCOUNTER — Emergency Department (HOSPITAL_COMMUNITY)
Admission: EM | Admit: 2021-08-16 | Discharge: 2021-08-16 | Discharge status: Against Medical Advice | Payer: No Typology Code available for payment source | Attending: Emergency Medicine | Admitting: Emergency Medicine

## 2021-08-16 ENCOUNTER — Emergency Department (HOSPITAL_COMMUNITY): Payer: No Typology Code available for payment source

## 2021-08-16 ENCOUNTER — Encounter (HOSPITAL_COMMUNITY): Payer: Self-pay | Admitting: Emergency Medicine

## 2021-08-16 DIAGNOSIS — X58XXXA Exposure to other specified factors, initial encounter: Secondary | ICD-10-CM | POA: Insufficient documentation

## 2021-08-16 DIAGNOSIS — T85528A Displacement of other gastrointestinal prosthetic devices, implants and grafts, initial encounter: Secondary | ICD-10-CM | POA: Insufficient documentation

## 2021-08-16 DIAGNOSIS — Z8673 Personal history of transient ischemic attack (TIA), and cerebral infarction without residual deficits: Secondary | ICD-10-CM | POA: Diagnosis not present

## 2021-08-16 DIAGNOSIS — Z431 Encounter for attention to gastrostomy: Secondary | ICD-10-CM | POA: Diagnosis not present

## 2021-08-16 DIAGNOSIS — Z5321 Procedure and treatment not carried out due to patient leaving prior to being seen by health care provider: Secondary | ICD-10-CM | POA: Insufficient documentation

## 2021-08-16 DIAGNOSIS — R1310 Dysphagia, unspecified: Secondary | ICD-10-CM | POA: Insufficient documentation

## 2021-08-16 DIAGNOSIS — Y731 Therapeutic (nonsurgical) and rehabilitative gastroenterology and urology devices associated with adverse incidents: Secondary | ICD-10-CM | POA: Diagnosis not present

## 2021-08-16 DIAGNOSIS — I2510 Atherosclerotic heart disease of native coronary artery without angina pectoris: Secondary | ICD-10-CM | POA: Insufficient documentation

## 2021-08-16 DIAGNOSIS — Z85819 Personal history of malignant neoplasm of unspecified site of lip, oral cavity, and pharynx: Secondary | ICD-10-CM | POA: Insufficient documentation

## 2021-08-16 MED ORDER — IOHEXOL 300 MG/ML  SOLN
50.0000 mL | Freq: Once | INTRAMUSCULAR | Status: AC | PRN
  Administered 2021-08-16: 35 mL

## 2021-08-16 NOTE — Procedures (Signed)
Pre procedural Dx: Dysphagia, inadvertent removal of G-tube. Post procedural Dx: Same  Successful fluoroscopic guided replacement of existing 18 Fr gastrostomy tube.   The feeding tube is ready for immediate use.  EBL: Trace Complications: None immediate.  Ronny Bacon, MD Pager #: [phone]

## 2021-08-16 NOTE — ED Notes (Signed)
Patient wanting to leave, explained I had let the do ctor know and he was going to come by and speak with him and then discharge him.

## 2021-08-16 NOTE — ED Notes (Signed)
MD came to speak with patient and patient had left with wife.

## 2021-08-16 NOTE — ED Notes (Signed)
MD attempted to put foley in place where gtube came out but unsuccessful going to contact IR

## 2021-08-16 NOTE — ED Triage Notes (Signed)
Pt states feeding tube came out today when he pulled his pants up from using the restroom.  Site WNL.

## 2021-08-16 NOTE — ED Notes (Signed)
Patient transported to [facility]oro

## 2021-08-16 NOTE — ED Provider Notes (Signed)
[facility] Provider Note   CSN: [id number] Arrival date & time: 08/16/21  1526     History  No chief complaint on file.   [name] is a 61 y.o. male.  HPI 61 year old male with a history of stroke, CAD, schizophrenia, oral malignancy requiring resection, and now with a G-tube since about 2021 presents with his G-tube falling out.  He has a Financial planner button and states about a couple hours ago it came out.  He denies any other complaints or recent illness.  No pain.  He had a 12 [place] Mickey button.  Home Medications Prior to Admission medications   Medication Sig Start Date End Date Taking? Authorizing Provider  aspirin EC 81 MG EC tablet Take 1 tablet (81 mg total) by mouth daily. 03/08/13   Charolette Forward, MD  atorvastatin (LIPITOR) 80 MG tablet Take 80 mg by mouth at bedtime.     [provider]  clopidogrel (PLAVIX) 75 MG tablet Take 75 mg by mouth daily.     [provider]  cyclobenzaprine (FLEXERIL) 10 MG tablet Take 10 mg by mouth 3 (three) times daily as needed for muscle spasms.     [provider]  fluconazole (DIFLUCAN) 40 MG/ML suspension Take 5 mL today, then, 2.60m daily for 20 more days. 09/25/19   SEppie Gibson MD  glipiZIDE (GLUCOTROL) 5 MG tablet Take 5 mg by mouth 2 (two) times daily before a meal.  03/24/19   [provider]  insulin detemir (LEVEMIR) 100 UNIT/ML FlexPen Inject 12 Units into the skin daily. May substitute for Lantus FlexPen if Levemir is not available 08/17/19   Rai, Ripudeep K, MD  insulin isophane & regular human (HUMULIN 70/30 MIX) (70-30) 100 UNIT/ML KwikPen Dose based on SSI coverage chart provided to patient by clinic 09/27/19   THarle Stanford, PA-C  lidocaine (XYLOCAINE) 2 % solution Patient: Mix 1part 2% viscous lidocaine, 1part H20. Swish & swallow 162mof diluted mixture, 3037mbefore meals and at bedtime, up to QID Patient not taking: Reported on 12/08/2019  09/04/19   SquEppie GibsonD  magnesium oxide (MAG-OX) 400 MG tablet Take 400 mg by mouth 2 (two) times daily.     [provider]  metFORMIN (GLUCOPHAGE) 1000 MG tablet Take 1,000 mg by mouth 2 (two) times daily. 03/24/19   [provider]  metoprolol tartrate (LOPRESSOR) 50 MG tablet Take 50 mg by mouth in the morning and at bedtime.  08/04/19   [provider]  phenytoin (DILANTIN) 125 MG/5ML suspension Take 4 mLs (100 mg total) by mouth 3 (three) times daily. 08/30/19   Tanner, VanLyndon CodePA-C  scopolamine (TRANSDERM-SCOP) 1 MG/3DAYS Place 1 patch (1.5 mg total) onto the skin every 3 (three) days. 08/16/20   SquEppie GibsonD  sodium fluoride (PREVIDENT 5000 PLUS) 1.1 % CREA dental cream Apply thin ribbon of cream to tooth brush. Brush teeth for 2 minutes. Spit out excess-DO NOT swallow. DO NOT rinse afterwards. Repeat nightly. Patient not taking: Reported on 12/08/2019 08/11/19   KulLenn CalDS  valproic acid (DEPAKENE) 250 MG/5ML solution Take 10 mLs (500 mg total) by mouth in the morning and at bedtime. 08/30/19   TanHarle StanfordPA-C  Water For Irrigation, Sterile (FREE WATER) SOLN Place 120 mLs into feeding tube 5 (five) times daily. 08/17/19   Rai, RipVernelle EmeraldD      Allergies    Morphine and related    Review of  Systems   Review of Systems  Constitutional:  Negative for fever.  Gastrointestinal:  Negative for abdominal pain.   Physical Exam Updated Vital Signs BP 131/84 (BP Location: Right Arm)   Pulse 99   Temp 98.7 F (37.1 C) (Oral)   Resp 17   SpO2 100%  Physical Exam Vitals and nursing note reviewed.  Constitutional:      Appearance: He is well-developed.  Pulmonary:     Effort: Pulmonary effort is normal.  Abdominal:     Palpations: Abdomen is soft.     Tenderness: There is no abdominal tenderness.     Comments: Left upper quadrant G-tube site is without obvious infection or swelling.  However there is a minimal opening, and I am not unable  to pass 2 different sized Foley catheters.  Meeting resistance.  Skin:    General: Skin is warm and dry.  Neurological:     Mental Status: He is alert.    ED Results / Procedures / Treatments   Labs (all labs ordered are listed, but only abnormal results are displayed) Labs Reviewed - No data to display  EKG None  Radiology No results found.  Procedures Procedures    Medications Ordered in ED Medications  iohexol (OMNIPAQUE) 300 MG/ML solution 50 mL (35 mLs Per Tube Contrast Given 08/16/21 1733)    ED Course/ Medical Decision Making/ A&P                           Medical Decision Making Amount and/or Complexity of Data Reviewed External Data Reviewed: notes.   Most the patient care has been done at [facility] signs are normal and he has no complaints besides the G-tube came out.  Unfortunately it seems like the opening has closed significantly and is so narrow I cannot pass a Foley catheter of multiple sizes.  Thus I consulted interventional radiology, Dr. Francena Hanly, who was able to replace the tube.  I went to reevaluate the patient after he had come back from radiology but apparently he eloped.        Final Clinical Impression(s) / ED Diagnoses Final diagnoses:  Dislodged gastrostomy tube    Rx / DC Orders ED Discharge Orders     None         Sherwood Gambler, MD 08/16/21 1805

## 2021-08-26 ENCOUNTER — Encounter (HOSPITAL_COMMUNITY): Payer: Self-pay | Admitting: Dentistry

## 2021-08-26 ENCOUNTER — Ambulatory Visit (INDEPENDENT_AMBULATORY_CARE_PROVIDER_SITE_OTHER): Payer: No Typology Code available for payment source | Admitting: Dentistry

## 2021-08-26 VITALS — BP 146/66 | HR 61 | Temp 98.1°F

## 2021-08-26 DIAGNOSIS — K083 Retained dental root: Secondary | ICD-10-CM

## 2021-08-26 DIAGNOSIS — Z923 Personal history of irradiation: Secondary | ICD-10-CM | POA: Diagnosis not present

## 2021-08-26 DIAGNOSIS — K036 Deposits [accretions] on teeth: Secondary | ICD-10-CM

## 2021-08-26 DIAGNOSIS — Z8589 Personal history of malignant neoplasm of other organs and systems: Secondary | ICD-10-CM | POA: Diagnosis not present

## 2021-08-26 DIAGNOSIS — Z0120 Encounter for dental examination and cleaning without abnormal findings: Secondary | ICD-10-CM

## 2021-08-26 DIAGNOSIS — K029 Dental caries, unspecified: Secondary | ICD-10-CM

## 2021-08-26 DIAGNOSIS — K0530 Chronic periodontitis, unspecified: Secondary | ICD-10-CM

## 2021-08-26 DIAGNOSIS — K045 Chronic apical periodontitis: Secondary | ICD-10-CM

## 2021-08-26 DIAGNOSIS — K08109 Complete loss of teeth, unspecified cause, unspecified class: Secondary | ICD-10-CM

## 2021-08-26 DIAGNOSIS — F40232 Fear of other medical care: Secondary | ICD-10-CM

## 2021-08-26 DIAGNOSIS — Z7902 Long term (current) use of antithrombotics/antiplatelets: Secondary | ICD-10-CM

## 2021-08-26 DIAGNOSIS — R1310 Dysphagia, unspecified: Secondary | ICD-10-CM

## 2021-08-26 NOTE — Progress Notes (Signed)
Department of Dental Medicine   Service Date:   08/26/2021  Patient Name:  [name] Date of Birth:   [birth year] Medical Record Number: [id number]  Referring Provider:           Eppie Gibson, M.D.  OUTPATIENT CONSULTATION PLAN/RECOMMENDATIONS   EXAM/ASSESSMENT: Caries risk:  HIGH Periodontal impression:  Inflamed, erythematous gingival tissue, accretions on teeth; chronic periodontitis Other findings:  Limited opening, retained root tip #19, radiation caries. There are no current signs of acute odontogenic infection including abscess, edema or erythema, or suspicious lesion requiring biopsy.    RECOMMENDATIONS: Extractions of all remaining teeth to decrease the risk of postoperative systemic infection & complications. Refer to oral surgeon for extractions due to complexity of 3rd molar extractions (#16 & #17), limited opening, complex medical hx & dental phobia. Refer to prosthodontist for consultation regarding replacement of missing teeth. Due to the complexity of the patient's case & his desire for lower complete dentures+upper,  he will need to see a specialist.  PLAN: Place referrals as needed/discussed after receiving isodose curves. Discuss case w/ medical team. Follow-up in the hospital dental clinic as needed. Discussed in detail all treatment options and recommendations with the patient and they are agreeable to the plan.  Next visit: TBD; follow-up as needed   Thank you for consulting with Hospital Dentistry and for the opportunity to participate in this patient's treatment.  Should you have any questions or concerns, please contact the [facility] at [phone].   08/26/2021    HISTORY OF PRESENT ILLNESS: [name] is a very pleasant 61 y.o. 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 Isidore Moos).  He also underwent surgical resection of the cancer, partial mandibulectomy with mandible reconstruction and bone plating, bilateral neck dissection, tracheostomy and free flap reconstruction with Dr. Hendricks Limes at [facility] in [place] on 06/08/2019.    His last ENT visit was on 01/30/2021 with Dr. Hendricks Limes:  "1. [id number] SCC of floor of mouth with mandibular invasion No concerning masses noted on exam NED - SLP visit on 10/2019 at [facility] per SLP recs - continue PT for shoulder 2. Neck wound likely 2/2 ORN vs hardware infection.  - this is likely infectious in origin given clinical course, though cannot rule out malignancy.  - he has experienced chronic infection over the past few months and now has exposed plate intraorally. Upon review of his recent CT neck, there is evidence of destruction of native mandibular body concerning for osteonecrosis/osteomyelitis; though cannot rule out malignancy.  He is s/p removal of his mandibular hardware, mandibular bone debridement His neck/face now has a small area of skin dehiscence. 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  - continue IV antibiotics for treatment for osteomyelitis (Dr.Morse at the [place]) - continue follow up with Dr.Squire 3. Sialorrhea - I suspect this is a combination of poor sensation at the floor of mouth/lips and poor swallow.  - This is also affecting his speech as his speech is cleared when we suction out the secretions.  - we discussed starting him on glycopyrrolate for 1 week with consideration of botulinum injection if he has improvement while on glycopyrrolate. However, this drug appears to have contraindications to his underlying disease processes.  Will see if he can check with his PCP/cardiologist. He states that his  cardiologist has cleared him. I talked to Dr.Jacobs and per their records he has not been seen by cardiology in a while. I have asked Dr.Jacobs to check with his PCP to see if glyocopyrolate would be ok for [place] to use. She will get back to me - he is agreement with the plan F/up in 3 months or sooner if botox is considered"  His last radiation oncology visit was on 02/21/2021 with Dr. Isidore Moos:  "1) Head and Neck Cancer Status: No evidence of disease 2) Nutritional Status: Weight is slightly reduced; patient is largely reliant on PEG tube.       Wt Readings from Last 3 Encounters:  02/21/21 127 lb 9.6 oz (57.9 kg)  08/16/20 129 lb (58.5 kg)  06/08/20 132 lb 4.4 oz (60 kg)   3) Risk Factors: The patient has been educated about risk factors including alcohol and tobacco abuse; they understand that avoidance of alcohol and tobacco is important to prevent recurrences as well as other cancers  4) Swallowing: dysphagia: continue SLP recommendations 5) Dental: Has dental issues; cannot find a dentist to see him quickly for medically necessary treatment/evaluation.  I asked Anderson Malta RN to again seek dental options for him today that [facility] will approve. 6) Thyroid function: check annually. WNL today Recent Labs       Lab Results  Component Value Date    TSH 0.928 02/21/2021     7) Copious secretions: scopolamine previously tried. Dr. Hendricks Limes recommended glycopyrrolate but was going to get PCP approval- pt did not yet receive Rx. Anderson Malta looking into this w/ [facility] navigator  8) Osteomyelitis: IV ABX with Dr. Leamon Arnt? Mention in Dr. Lavonia Dana note, patient unaware of appts. Anderson Malta [facility]. 9) Followup: with me in 45mo sooner PRN. Other notable issues, if any: States he has not been able [place] [facility] so he continues to struggle with copious saliva production. Continues to experience drainage from firm nodule on right side of jaw (reports  drainage is clear). Also reports numbness to the right side of his chin, lower lip, right jaw, and up the right side of his head. Reports a new pain to the inside of his left ear (states it mainly occurs when he yawns)."  The patient presents today for a dental consultation/examination to evaluate poor dentition.   DENTAL HISTORY: The patient was previously seen back in 2021 by Dr. KEnrique Sackfor a pre-radiation therapy dental exam.  He has not seen a regular dentist since 3-4 before then.  [facility], however he has been having difficulty getting an appointment scheduled with them.  He currently denies any dental/orofacial pain or sensitivity.   CHIEF COMPLAINT:  He reports difficulty eating and chewing due to his poor dentition.  He is largely reliant on his PEG tube to maintain adequate nutrition.  He is interested in extractions and replacing missing teeth so he can eat again.   Patient Active Problem List   Diagnosis Date Noted   Diabetes mellitus due to underlying condition with diabetic nephropathy, with long-term current use of insulin ([facility] [place] 09/27/2019   Malnutrition of moderate degree 08/16/2019   Hyperkalemia 08/15/2019   Hyperglycemia 08/15/2019   SIRS (systemic inflammatory response syndrome) ([facility] 08/15/2019   AKI (acute kidney injury) ([place] 08/15/2019   Oral candidiasis 08/15/2019  Hypercalcemia 08/15/2019   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 ([place]) 08/08/2019   Schizophrenia spectrum disorder with psychotic disorder type not yet determined ([place]) 09/24/2015   Intermittent explosive disorder 09/20/2015   Severe recurrent major depressive disorder with psychotic features ([place]) 09/20/2015   Suicidal ideation 09/20/2015   Homicidal ideation 09/20/2015   Chest pain at rest 03/19/2013   Acute non Q wave MI (myocardial infarction), initial episode of care ([place]) 03/06/2013   Chronic sciatica of left side  09/10/2012   Personality disorder ([place]) 11/10/2011   Alcohol use disorder, mild, abuse 10/01/2011   NSTEMI (non-ST elevated myocardial infarction) ([place]) 10/01/2011   Erectile dysfunction 08/01/2010   Urge incontinence of urine 08/01/2010   Chronic alcoholic pancreatitis ([place]) 07/24/2010   Tobacco use disorder 07/24/2010   Hypertension 07/24/2010   Hyperlipidemia 07/24/2010   CVA (cerebral vascular accident) ([place]) 07/24/2010   Chronic back pain 07/24/2010   Past Medical History:  Diagnosis Date   Anxiety    Cancer ([place])    mouth   Chronic pancreatitis ([place])    Coronary artery disease    Depression    Diabetes mellitus without complication (HCC)    Hyperlipidemia    Hypertension    Myocardial infarction ([place])    Neuromuscular disorder ([place])    Sciatica    Seizures ([place])    Stroke ([place])    Substance abuse ([place])    Past Surgical History:  Procedure Laterality Date   BACK SURGERY     CORONARY STENT PLACEMENT     GASTROSTOMY TUBE PLACEMENT Left 07/31/2019   [facility]   LEFT HEART CATHETERIZATION WITH CORONARY ANGIOGRAM N/A 10/01/2011   Procedure: LEFT HEART CATHETERIZATION WITH CORONARY ANGIOGRAM;  Surgeon: Clent Demark, MD;  Location: [facility] CATH LAB;  Service: Cardiovascular;  Laterality: N/A;   LEFT HEART CATHETERIZATION WITH CORONARY ANGIOGRAM N/A 03/06/2013   Procedure: LEFT HEART CATHETERIZATION WITH CORONARY ANGIOGRAM;  Surgeon: Clent Demark, MD;  Location: [facility] CATH LAB;  Service: Cardiovascular;  Laterality: N/A;   PERCUTANEOUS CORONARY STENT INTERVENTION (PCI-S) N/A 10/02/2011   Procedure: PERCUTANEOUS CORONARY STENT INTERVENTION (PCI-S);  Surgeon: Clent Demark, MD;  Location: [facility] CATH LAB;  Service: Cardiovascular;  Laterality: N/A;   TRACHEOSTOMY CLOSURE     Allergies  Allergen Reactions   Morphine And Related Itching   Current Outpatient Medications  Medication Sig Dispense Refill   aspirin EC 81 MG EC tablet Take 1 tablet (81 mg total) by mouth daily. 30 tablet 3    atorvastatin (LIPITOR) 80 MG tablet Take 80 mg by mouth at bedtime.      clopidogrel (PLAVIX) 75 MG tablet Take 75 mg by mouth daily.      cyclobenzaprine (FLEXERIL) 10 MG tablet Take 10 mg by mouth 3 (three) times daily as needed for muscle spasms.      fluconazole (DIFLUCAN) 40 MG/ML suspension Take 5 mL today, then, 2.29m daily for 20 more days. 55 mL 0   glipiZIDE (GLUCOTROL) 5 MG tablet Take 5 mg by mouth 2 (two) times daily before a meal.      insulin detemir (LEVEMIR) 100 UNIT/ML FlexPen Inject 12 Units into the skin daily. May substitute for Lantus FlexPen if Levemir is not available 15 mL 3   insulin isophane & regular human (HUMULIN 70/30 MIX) (70-30) 100 UNIT/ML KwikPen Dose based on SSI coverage chart provided to patient by clinic 15 mL 11   lidocaine (XYLOCAINE) 2 % solution Patient: Mix 1part 2% viscous lidocaine, 1part H20. Swish &  swallow 82m of diluted mixture, 335m before meals and at bedtime, up to QID (Patient not taking: Reported on 12/08/2019) 200 mL 2   magnesium oxide (MAG-OX) 400 MG tablet Take 400 mg by mouth 2 (two) times daily.      metFORMIN (GLUCOPHAGE) 1000 MG tablet Take 1,000 mg by mouth 2 (two) times daily.     metoprolol tartrate (LOPRESSOR) 50 MG tablet Take 50 mg by mouth in the morning and at bedtime.      phenytoin (DILANTIN) 125 MG/5ML suspension Take 4 mLs (100 mg total) by mouth 3 (three) times daily. 237 mL 2   scopolamine (TRANSDERM-SCOP) 1 MG/3DAYS Place 1 patch (1.5 mg total) onto the skin every 3 (three) days. 10 patch 5   sodium fluoride (PREVIDENT 5000 PLUS) 1.1 % CREA dental cream Apply thin ribbon of cream to tooth brush. Brush teeth for 2 minutes. Spit out excess-DO NOT swallow. DO NOT rinse afterwards. Repeat nightly. (Patient not taking: Reported on 12/08/2019) 1 Tube prn   valproic acid (DEPAKENE) 250 MG/5ML solution Take 10 mLs (500 mg total) by mouth in the morning and at bedtime. 600 mL 2   Water For Irrigation, Sterile (FREE WATER) SOLN  Place 120 mLs into feeding tube 5 (five) times daily.     No current facility-administered medications for this visit.    LABS: Lab Results  Component Value Date   WBC 12.2 (H) 06/08/2020   HGB 12.4 (L) 06/08/2020   HCT 39.1 06/08/2020   MCV 87.7 06/08/2020   PLT 290 06/08/2020      Component Value Date/Time   NA 137 06/08/2020 0625   K 4.4 06/08/2020 0625   CL 101 06/08/2020 0625   CO2 25 06/08/2020 0625   GLUCOSE 219 (H) 06/08/2020 0625   BUN 20 06/08/2020 0625   CREATININE 0.69 06/08/2020 0625   CREATININE 0.87 09/07/2019 1125   CALCIUM 9.2 06/08/2020 0625   GFRNONAA >60 06/08/2020 0625   GFRNONAA >60 09/07/2019 1125   GFRAA >60 09/29/2019 1705   GFRAA >60 09/07/2019 1125   Lab Results  Component Value Date   INR 1.10 03/20/2013   INR 1.62 (H) 03/06/2013   INR 1.06 07/29/2012   No results found for: "PTT"  Social History   Socioeconomic History   Marital status: Married    Spouse name: Not on file   Number of children: 1   Years of education: Not on file   Highest education level: Not on file  Occupational History   Not on file  Tobacco Use   Smoking status: Former    Packs/day: 1.00    Years: 40.00    Total pack years: 40.00    Types: Cigarettes   Smokeless tobacco: Never  Vaping Use   Vaping Use: Never used  Substance and Sexual Activity   Alcohol use: Not Currently   Drug use: Never   Sexual activity: Not on file  Other Topics Concern   Not on file  Social History Narrative   The patient is married. The patient has a 2640ear old daughter.  He is currently unemployed, and  has a high school degree.        He has a history of 1- 1.5 pack per day smoking of many years.  He states he has quit smoking (07/2019)      He has a history of heavy alcohol use, states typically drinking a fifth to a pint of gin daily, after stroke in 06/2010, decreased to one pint of gin  per week, and after hospitalization for acute on chronic pancreatitis, was abstinent  for at least 1-2 weeks (from 5/7 to 5/17)        He denies any illicit drug use.    Social Determinants of Health   Financial Resource Strain: Low Risk  (08/28/2019)   Overall Financial Resource Strain (CARDIA)    Difficulty of Paying Living Expenses: Not hard at all  Food Insecurity: No Food Insecurity (08/28/2019)   Hunger Vital Sign    Worried About Running Out of Food in the Last Year: Never true    Ran Out of Food in the Last Year: Never true  Transportation Needs: No Transportation Needs (08/28/2019)   PRAPARE - Hydrologist (Medical): No    Lack of Transportation (Non-Medical): No  Physical Activity: Not on file  Stress: Not on file  Social Connections: Not on file  Intimate Partner Violence: Not on file   Family History  Family history unknown: Yes     REVIEW OF SYSTEMS:  Reviewed with the patient as per HPI. PSYCH:  [+] Dental phobia   VITAL SIGNS: BP (!) 146/66 (BP Location: Right Arm, Patient Position: Sitting, Cuff Size: Normal)   Pulse 61   Temp 98.1 F (36.7 C) (Oral)    PHYSICAL EXAM: GENERAL:  Comfortable and in no apparent distress. He is largely reliant on PEG tube feedings still after finishing treatment for cancer. He carries a towel with him due to issues with excess saliva. NEUROLOGICAL:  Alert and oriented to person, place and  time. EXTRAORAL:  Facial symmetry present without any edema or erythema.  No swelling or lymphadenopathy.  TMJ asymptomatic without clicks or crepitations.    Maximum Interincisal Opening:  Unable to assess; missing mandibular anterior teeth INTRAORAL:  Soft tissues appear well-perfused.  FOM and vestibules soft and not raised. Oral cavity without mass or lesion. No signs of infection, parulis, sinus tract, edema or erythema evident upon exam.  History of surgical resection of anterior floor of mouth cancer with mandibulectomy+mandible reconstruction and bone plating. [+] Soft tissues:  Copious  salivary secretions, however saliva is thick+viscous and his mucous membranes appear to have a foamy/white coating. He has had issues with excess saliva affecting his speech and eating/swallowing.   DENTAL EXAM: Hard tissue exam completed and charted.    OVERALL IMPRESSION:  Poor remaining dentition.       ORAL HYGIENE:  Poor     PERIODONTAL:  Inflamed and erythematous gingival tissue. Generalized heavy plaque accumulation. CARIES:  Rampant decay in all 4 quadrants, radiation caries RETAINED ROOT TIPS:  #19 PROSTHODONTICS:  Patient denies history of wearing partial or complete dentures. OCCLUSION: Unable to assess occlusion. Non-functional teeth:  #3 - #13, #18, #19 Supra-erupted teeth:  #4, #5, #6, #11, #12, #13 OTHER FINDINGS:   [+] Attrition/wear:  #7 - #10IL [+] Rotation:  #11, #12 [+] Drifting:  #15 towards mesial   RADIOGRAPHIC EXAM:  PAN exposed and interpreted.  Condyles seated bilaterally in fossas.  No evidence of abnormal pathology.  All visualized osseous structures appear WNL. #15 drifting towards mesial.  #4, #5, #12 & #13 appear supra-erupted.  Evidence of hardware left of the midline of the mandible consistent w/ pt's history of mandibulectomy as well as bilateral radiopacities evident on right & left sides throughout the mandibular body consistent w/ pins. Missing teeth #'s 1, 14, 20-30 & 32.  Existing restoration on #3.  Periapical radiolucencies notable on #18, #19 & #31. #  19 retained root tip. #13, #14, #15, #18 & #31 deep decay approximating the pulp.  Caries-  #2MOD, #3MOD, #38M&D, #70M&D, #13D, #170MOD, #16MOD, #17MOD, #18MOD & #31MOD   ASSESSMENT:  1.  History of radiation therapy to the head and neck 2.  History of anterior floor of the mouth cancer 3. Routine dental consultation/exam (outpatient) 4.  Antiplatelet therapy (current) on clopidogrel  5.  Accretions on teeth 6.  Dental Phobia 7.  Dysphagia 8.  Radiation caries 9.  Missing teeth 10.   Postoperative bleeding risk 11.  Chronic apical periodontitis 12.  Chronic periodontitis 13.  Retained root tip    PLAN AND RECOMMENDATIONS: I discussed the risks, benefits, and complications of various scenarios with the patient in relationship to their medical and dental conditions, which included systemic infection or other serious issues such as osteoradionecrosis that could potentially occur if dental/oral concerns are not addressed.  I explained that if any chronic or acute dental/oral infection(s) are addressed and subsequently not maintained following medical optimization and recovery, their risk of the previously mentioned complications are just as high and could potentially occur at a later time.  I explained all significant findings of the dental consultation with the patient including severe cavities infecting most/many of his remaining teeth and the recommended care including extractions of all remaining teeth in order to optimize them from a dental standpoint.  The patient verbalized understanding of all findings, discussion, and recommendations. We then discussed various treatment options to include no treatment, multiple extractions with alveoloplasty, pre-prosthetic surgery as indicated, periodontal therapy, dental restorations, root canal therapy, crown and bridge therapy, implant therapy, and replacement of missing teeth as indicated.  The patient verbalized understanding of all options, and currently wishes to proceed with full mouth extractions with pre-prosthetic surgery/alveoloplasty as needed and replacement of missing teeth after extractions.  I would recommend referral to an oral surgeon for his extractions/pre-prosthetic surgery due to remaining 3rd molars and his limited opening, his complex medical history (current antiplatelet therapy, history of osteoradionecrosis/osteomyelitis and head+neck radiation) and his severe dental phobia. I explained that due to his complex case,  particularly fabricating dentures for his lower jaw, he will need to see a prosthodontist or dental specialist for a consult to discuss his different treatment options.  I explained that while I would possibly be able to make him a complete upper denture after he heals from extractions, I would not be able to make him a lower complete denture.  He verbalized understanding and states that he is very interesting in both upper and lower dentures so that he can eat again and be able to maintain adequate nutrition by mouth. We also discussed that I will need to obtain his isodose curves from Dr. Isidore Moos so that I can send them with his referrals if they are requested.  He verbalized understanding and is agreeable to the plan discussed. Plan to discuss all findings and recommendations with medical team and coordinate future care as needed.   Call if any questions or concerns arise.  Follow-up in the dental clinic as needed.  All questions and concerns were invited and addressed.  The patient tolerated today's visit well and departed in stable condition.  I spent in excess of 120 minutes during the conduct of this consultation and >50% of this time involved direct face-to-face encounter for counseling and/or coordination of the patient's care.   Charlaine Dalton, D.M.D.

## 2021-08-29 ENCOUNTER — Other Ambulatory Visit (HOSPITAL_COMMUNITY): Payer: No Typology Code available for payment source | Admitting: Dentistry

## 2021-09-03 DIAGNOSIS — Z0120 Encounter for dental examination and cleaning without abnormal findings: Secondary | ICD-10-CM | POA: Insufficient documentation

## 2021-09-03 DIAGNOSIS — Z7902 Long term (current) use of antithrombotics/antiplatelets: Secondary | ICD-10-CM | POA: Insufficient documentation

## 2021-09-03 DIAGNOSIS — F40232 Fear of other medical care: Secondary | ICD-10-CM | POA: Insufficient documentation

## 2021-09-03 DIAGNOSIS — K029 Dental caries, unspecified: Secondary | ICD-10-CM | POA: Insufficient documentation

## 2021-09-03 DIAGNOSIS — Y842 Radiological procedure and radiotherapy as the cause of abnormal reaction of the patient, or of later complication, without mention of misadventure at the time of the procedure: Secondary | ICD-10-CM | POA: Insufficient documentation

## 2021-09-03 DIAGNOSIS — K0530 Chronic periodontitis, unspecified: Secondary | ICD-10-CM | POA: Insufficient documentation

## 2021-09-03 DIAGNOSIS — K08109 Complete loss of teeth, unspecified cause, unspecified class: Secondary | ICD-10-CM | POA: Insufficient documentation

## 2021-09-03 DIAGNOSIS — K036 Deposits [accretions] on teeth: Secondary | ICD-10-CM | POA: Insufficient documentation

## 2021-09-03 DIAGNOSIS — K045 Chronic apical periodontitis: Secondary | ICD-10-CM | POA: Insufficient documentation

## 2021-09-03 DIAGNOSIS — K083 Retained dental root: Secondary | ICD-10-CM | POA: Insufficient documentation

## 2021-09-03 DIAGNOSIS — Z8589 Personal history of malignant neoplasm of other organs and systems: Secondary | ICD-10-CM | POA: Insufficient documentation

## 2021-09-03 DIAGNOSIS — Z923 Personal history of irradiation: Secondary | ICD-10-CM | POA: Insufficient documentation

## 2021-09-03 DIAGNOSIS — R1310 Dysphagia, unspecified: Secondary | ICD-10-CM | POA: Insufficient documentation

## 2021-09-11 NOTE — Progress Notes (Signed)
[name] presents today for follow-up after completing radiation to his floor of mouth on 09/27/2019  Pain issues, if any: Denies any mouth or throat pain. Reports numbness to his tongue, lower jaw/lip, and right side of his face Using a feeding tube?: Yes--continues to be main source of nutrition (though patient reports [place] told him to decrease number of supplement cartons he instilled daily. Was doing 7-8, now only doing 3-4 daily) Tube recently fell out, and patient went to ED to have replaced Weight changes, if any:  Wt Readings from Last 3 Encounters:  09/12/21 119 lb 6 oz (54.1 kg)  02/21/21 127 lb 9.6 oz (57.9 kg)  08/16/20 129 lb (58.5 kg)   Swallowing issues, if any: Able to tolerate liquids without difficulty  Smoking or chewing tobacco? None Using fluoride trays daily? N/A--08/26/2021 saw Dr. Sandi Mariscal:  --RECOMMENDATIONS: Extractions of all remaining teeth to decrease the risk of postoperative systemic infection & complications. Refer to oral surgeon for extractions due to complexity of 3rd molar extractions (#16 & #17), limited opening, complex medical hx & dental phobia. Refer to prosthodontist for consultation regarding replacement of missing teeth. Due to the complexity of the patient's case & his desire for lower complete dentures+upper,  he will need to see a specialist. --PLAN: Place referrals as needed/discussed after receiving isodose curves. Discuss case w/ medical team. Follow-up in the hospital dental clinic as needed. Discussed in detail all treatment options and recommendations with the patient and they are agreeable to the plan. --Next visit: TBD; follow-up as needed  Last ENT visit was on: 08/21/2021 Saw Dr. Kandace Parkins (unable to access office note in Care Everywhere)  --From 05/15/2021 office visit:  No concerning masses noted on exam NED SLP visit on 10/[facility] at [facility] - diet per SLP recs continue PT for shoulder continue follow up with  Dr.Squire He is s/p removal of his mandibular hardware, mandibular bone debridement His neck/face now has a small area of skin dehiscence. 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  completed IV antibiotics for treatment for osteomyelitis ( Dr.Morse at the [place]) he may benefit from HBO treatment Sialorrhea I suspect this is a combination of poor sensation at the floor of mouth/lips and poor swallow.  This is also affecting his speech as his speech is cleared when we suction out the secretions.  injected botox to bilateral parotid gland today --Follow up in 3 months  Other notable issues, if any: Continues to deal with copious saliva production. Last CT neck[facility]thyroid [facility]/27/2021 at [facility]

## 2021-09-12 ENCOUNTER — Other Ambulatory Visit: Payer: Self-pay

## 2021-09-12 ENCOUNTER — Ambulatory Visit
Admission: RE | Admit: 2021-09-12 | Discharge: 2021-09-12 | Disposition: A | Discharge status: Home / Self Care | Payer: Medicare PPO | Source: Ambulatory Visit | Attending: Radiation Oncology | Admitting: Radiation Oncology

## 2021-09-12 ENCOUNTER — Encounter: Payer: Self-pay | Admitting: Radiation Oncology

## 2021-09-12 VITALS — BP 128/75 | HR 63 | Temp 97.1°F | Resp 18 | Ht 70.0 in | Wt 119.4 lb

## 2021-09-12 DIAGNOSIS — F17210 Nicotine dependence, cigarettes, uncomplicated: Secondary | ICD-10-CM | POA: Diagnosis not present

## 2021-09-12 DIAGNOSIS — Z85818 Personal history of malignant neoplasm of other sites of lip, oral cavity, and pharynx: Secondary | ICD-10-CM | POA: Diagnosis present

## 2021-09-12 DIAGNOSIS — C040 Malignant neoplasm of anterior floor of mouth: Secondary | ICD-10-CM

## 2021-09-12 DIAGNOSIS — Z923 Personal history of irradiation: Secondary | ICD-10-CM | POA: Diagnosis not present

## 2021-09-12 DIAGNOSIS — Z1329 Encounter for screening for other suspected endocrine disorder: Secondary | ICD-10-CM

## 2021-09-12 NOTE — Progress Notes (Addendum)
Radiation Oncology         [phone] ________________________________  Name: [name] MRN: [id number]  Date: 09/12/2021  DOB: [birth year]  Follow-Up Visit Note  CC: Clinic, [facility], [facility]  Diagnosis and Prior Radiotherapy:       ICD-10-CM   1. 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 Reports numbness to his tongue, lower jaw/lip, and right side of his face Using a feeding tube?: Yes--continues to be main source of nutrition (though patient reports [name] told him to decrease number of supplement cartons he instilled daily. Was doing 7-8, now only doing 3-4 daily) Tube recently fell out, and patient went to ED to have replaced Weight changes, if any:  Wt Readings from Last 3 Encounters:  09/12/21 119 lb 6 oz (54.1 kg)  02/21/21 127 lb 9.6 oz (57.9 kg)  08/16/20 129 lb (58.5 kg)   Swallowing issues, if any: Able to tolerate liquids without difficulty  Smoking or chewing tobacco? None Using fluoride trays daily? N/A--08/26/2021 saw Dr. Sandi Mariscal:  --RECOMMENDATIONS: Extractions of all remaining teeth to decrease the risk of postoperative systemic infection & complications. Refer to oral surgeon for extractions due to complexity of 3rd molar extractions (#16 & #17), limited opening, complex medical hx &  dental phobia. Refer to prosthodontist for consultation regarding replacement of missing teeth. Due to the complexity of the patient's case & his desire for lower complete dentures+upper,  he will need to see a specialist. --PLAN: Place referrals as needed/discussed after receiving isodose curves. Discuss case w/ medical team. Follow-up in the hospital dental clinic as needed. Discussed in detail all treatment options and recommendations with the patient and they are agreeable to the plan. --Next visit: TBD; follow-up as needed  Last ENT visit was on: 08/21/2021 Saw Dr. Kandace Parkins (unable to access office note in Care Everywhere)  --From 05/15/2021 office visit:  No concerning masses noted on exam NED SLP visit on 10/2019 at [facility] - diet per SLP recs continue PT for shoulder continue follow up with Dr.Demarus Latterell He is s/p removal of his mandibular hardware, mandibular bone debridement His neck/face now has a small area of skin dehiscence. 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  completed IV antibiotics for treatment for osteomyelitis ( Dr.Morse at the [place]) he may benefit from HBO treatment Sialorrhea I suspect this is a combination of poor sensation at the floor of mouth/lips and poor swallow.  This is also affecting his speech as his speech is cleared when we suction out the secretions.  injected botox to bilateral parotid gland today --Follow up in 3 months  Other notable issues, if any: Continues to deal with copious saliva production. Last CT neck/thyroid performed 02/27/2021 at [facility]     ALLERGIES:  is allergic to morphine and related.  Meds: Current Outpatient  Medications  Medication Sig Dispense Refill   aspirin EC 81 MG EC tablet Take 1 tablet (81 mg total) by mouth daily. 30 tablet 3   atorvastatin (LIPITOR) 80 MG tablet Take 80 mg by mouth at bedtime.      clopidogrel (PLAVIX) 75 MG tablet Take 75 mg by mouth daily.      cyclobenzaprine (FLEXERIL) 10 MG  tablet Take 10 mg by mouth 3 (three) times daily as needed for muscle spasms.      fluconazole (DIFLUCAN) 40 MG/ML suspension Take 5 mL today, then, 2.108m daily for 20 more days. 55 mL 0   glipiZIDE (GLUCOTROL) 5 MG tablet Take 5 mg by mouth 2 (two) times daily before a meal.      insulin detemir (LEVEMIR) 100 UNIT/ML FlexPen Inject 12 Units into the skin daily. May substitute for Lantus FlexPen if Levemir is not available 15 mL 3   insulin isophane & regular human (HUMULIN 70/30 MIX) (70-30) 100 UNIT/ML KwikPen Dose based on SSI coverage chart provided to patient by clinic 15 mL 11   lidocaine (XYLOCAINE) 2 % solution Patient: Mix 1part 2% viscous lidocaine, 1part H20. Swish & swallow 111mof diluted mixture, 3084mbefore meals and at bedtime, up to QID (Patient not taking: Reported on 12/08/2019) 200 mL 2   magnesium oxide (MAG-OX) 400 MG tablet Take 400 mg by mouth 2 (two) times daily.      metFORMIN (GLUCOPHAGE) 1000 MG tablet Take 1,000 mg by mouth 2 (two) times daily.     metoprolol tartrate (LOPRESSOR) 50 MG tablet Take 50 mg by mouth in the morning and at bedtime.      phenytoin (DILANTIN) 125 MG/5ML suspension Take 4 mLs (100 mg total) by mouth 3 (three) times daily. 237 mL 2   scopolamine (TRANSDERM-SCOP) 1 MG/3DAYS Place 1 patch (1.5 mg total) onto the skin every 3 (three) days. 10 patch 5   sodium fluoride (PREVIDENT 5000 PLUS) 1.1 % CREA dental cream Apply thin ribbon of cream to tooth brush. Brush teeth for 2 minutes. Spit out excess-DO NOT swallow. DO NOT rinse afterwards. Repeat nightly. (Patient not taking: Reported on 12/08/2019) 1 Tube prn   valproic acid (DEPAKENE) 250 MG/5ML solution Take 10 mLs (500 mg total) by mouth in the morning and at bedtime. 600 mL 2   Water For Irrigation, Sterile (FREE WATER) SOLN Place 120 mLs into feeding tube 5 (five) times daily.     No current facility-administered medications for this encounter.    Physical Findings: The patient is in no acute  distress. Patient is alert and oriented. Wt Readings from Last 3 Encounters:  09/12/21 119 lb 6 oz (54.1 kg)  02/21/21 127 lb 9.6 oz (57.9 kg)  08/16/20 129 lb (58.5 kg)    height is '5\' 10"'$  (1.778 m) and weight is 119 lb 6 oz (54.1 kg). His temporal temperature is 97.1 F (36.2 C) (abnormal). His blood pressure is 128/75 and his pulse is 63. His respiration is 18 and oxygen saturation is 100%. .  General: Alert and oriented, in no acute distress, ambulatory HEENT: Oral cavity and oropharynx demonstrate no thrush and no sign of recurrence.   Neck:   There is fibrosis in the upper neck and submental region. Right submental region no longer draining - there is overlying crusting on skin. No adenopathy palpated MSK: muscle wasting diffusely, ambulatory. Psychiatric: Pleasant affect, alert and oriented Heart RRR Chest CTAB   Lab Findings: Lab Results  Component Value Date  WBC 12.2 (H) 06/08/2020   HGB 12.4 (L) 06/08/2020   HCT 39.1 06/08/2020   MCV 87.7 06/08/2020   PLT 290 06/08/2020    Lab Results  Component Value Date   TSH 0.928 02/21/2021    Radiographic Findings: DG Perc Gastrostomy Tube Insert W/Fluoro  Result Date: 08/17/2021 INDICATION: History of head neck cancer with inadvertent removal of chronic feeding gastrostomy tube. EXAM: FLUOROSCOPIC GUIDED REPLACEMENT OF GASTROSTOMY TUBE COMPARISON:  None Available. - This is the first intervention on the patient's gastrostomy tube at this institution. MEDICATIONS: None. CONTRAST:  35 mL OMNIPAQUE IOHEXOL 300 MG/ML SOLN - administered into the gastric lumen FLUOROSCOPY TIME:  36 seconds (3.6 mGy) COMPLICATIONS: None immediate. PROCEDURE: Informed written consent was obtained from the patient after a discussion of the risks, benefits and alternatives to treatment. Questions regarding the procedure were encouraged and answered. A timeout was performed prior to the initiation of the procedure. Patient was positioned supine on the  fluoroscopy table. A Kumpe catheter was inserted via the chronic gastrostomy tube track to the level of the gastric lumen. Contrast injection confirmed appropriate positioning. Next, a short Amplatz wire was coiled within the gastric fundus and the Kumpe catheter was exchanged for a new 18-French balloon inflatable gastrostomy tube. The balloon was inflated with saline and dilute contrast and pulled against the anterior inner lumen of the stomach and the external disc was cinched. Contrast was injected and a post procedural spot fluoroscopic image was obtained confirming appropriate positioning and functionality of the new gastrostomy tube. A dressing was applied. The patient tolerated the procedure well without immediate postprocedural complication. IMPRESSION: Successful fluoroscopic guided replacement of a new 18-French gastrostomy tube. The gastrostomy tube is ready for immediate use. Electronically Signed   By: Sandi Mariscal M.D.   On: 08/17/2021 13:25    Impression/Plan:    1) Head and Neck Cancer Status: No evidence of disease  2) Nutritional Status: Weight is reduced; patient urged to take in more by mouth and PEG.  He reduced PEG intake by half - advised to reverse back to using it more if he cannot eat more. Wt Readings from Last 3 Encounters:  09/12/21 119 lb 6 oz (54.1 kg)  02/21/21 127 lb 9.6 oz (57.9 kg)  08/16/20 129 lb (58.5 kg)    3) Risk Factors: The patient has been educated about risk factors including alcohol and tobacco abuse; they understand that avoidance of alcohol and tobacco is important to prevent recurrences as well as other cancers   Still smoking 1/2 ppd.  I advised the patient to quit. Services were offered by me today including outpatient counseling and pharmacotherapy. I assessed for the willingness to attempt to quit and provided encouragement and demonstrated willingness to make referrals and/or prescriptions to help the patient attempt to quit. The patient has  follow-up with the oncologic team to touch base on their tobacco use and /or cessation efforts.  Over 3 minutes were spent on this issue. He plans to [place] on 09-15-21 without assistance.  4) Swallowing: dysphagia: continue SLP recommendations  5) Dental: I shared his isodose lines w/ Sandi Mariscal DDS recently. She is helping him arrange full extractions with an outside prosthodontist. See #7.  6) Thyroid function: check annually.   Lab Results  Component Value Date   TSH 0.928 02/21/2021   7) Jaw healing issues:  IV ABX with Dr. Leamon Arnt in the past, to my understanding.  The may be an element of osteoradionecrosis per notes from [facility]  ENT. This may be relevant to Dr. Benson Norway and/or the surgeon she referred him to for possible extractions. Dr. Benson Norway notified.  Katelin and English as a second language teacher are going to work together to get him into the wound care clinic for possible HBO *if* the [place] approves this. This is medically necessary as he is at high risk for severe morbidity if the jaw does not heal.  8) Followup: with me in 95mo sooner PRN. I have personally conferred with Dr PHendricks Limesre: the above.  On date of service, in total, I spent 35 minutes on this encounter. Patient was seen in person. _____________________________________   SEppie Gibson MD

## 2021-09-19 ENCOUNTER — Other Ambulatory Visit: Payer: Self-pay | Admitting: Physician Assistant

## 2021-09-19 ENCOUNTER — Encounter: Payer: Medicare PPO | Attending: Physician Assistant | Admitting: Physician Assistant

## 2021-09-19 DIAGNOSIS — Z923 Personal history of irradiation: Secondary | ICD-10-CM | POA: Insufficient documentation

## 2021-09-19 DIAGNOSIS — M272 Inflammatory conditions of jaws: Secondary | ICD-10-CM | POA: Diagnosis not present

## 2021-09-19 DIAGNOSIS — E1169 Type 2 diabetes mellitus with other specified complication: Secondary | ICD-10-CM | POA: Diagnosis present

## 2021-09-19 DIAGNOSIS — C040 Malignant neoplasm of anterior floor of mouth: Secondary | ICD-10-CM | POA: Insufficient documentation

## 2021-09-19 DIAGNOSIS — M8668 Other chronic osteomyelitis, other site: Secondary | ICD-10-CM | POA: Insufficient documentation

## 2021-09-19 DIAGNOSIS — Z9289 Personal history of other medical treatment: Secondary | ICD-10-CM

## 2021-09-19 NOTE — Progress Notes (Signed)
ZAKARIYAH, FREIMARK ([id number]) Visit Report for 09/19/2021 Allergy List Details Patient Name: ELWOOD, BAZINET L. Date of Service: 09/19/2021 10:00 AM Medical Record Number: [id number] Patient Account Number: [id number] Date of Birth/Sex: 12-Apr-1960 (61 y.o. M) Treating RN: Levora Dredge Primary Care Annaclaire Walsworth: CLINIC, [place] Other Clinician: Referring Nayana Lenig: Eppie Gibson Treating Kenise Barraco/Extender: Jeri Cos Weeks in Treatment: 0 Allergies Active Allergies morphine Reaction: itching Allergy Notes Electronic Signature(s) Signed: 09/19/2021 4:30:05 PM By: Levora Dredge Entered By: Levora Dredge on 09/19/2021 10:14:44 Nettleton, Enid Skeens ([id number]) -------------------------------------------------------------------------------- Arrival Information Details Patient Name: Shari Prows, Rocko L. Date of Service: 09/19/2021 10:00 AM Medical Record Number: [id number] Patient Account Number: [id number] Date of Birth/Sex: [birth year] (61 y.o. M) Treating RN: Levora Dredge Primary Care Jermisha Hoffart: CLINIC, [place] Other Clinician: Referring Terika Pillard: Eppie Gibson Treating Serrita Lueth/Extender: Skipper Cliche in Treatment: 0 Visit Information Patient Arrived: Cane Arrival Time: 10:11 Accompanied By: self Transfer Assistance: None Patient Identification Verified: Yes Secondary Verification Process Completed: Yes Patient Has Alerts: Yes Patient Alerts: Patient on Blood Thinner Electronic Signature(s) Signed: 09/19/2021 11:21:39 AM By: Levora Dredge Entered By: Levora Dredge on 09/19/2021 11:21:38 [place], Enid Skeens ([id number]) -------------------------------------------------------------------------------- Clinic Level of Care Assessment Details Patient Name: Shari Prows, Silus L. Date of Service: 09/19/2021 10:00 AM Medical Record Number: [id number] Patient Account Number: [id number] Date of Birth/Sex: 21-Mar-1960 (61 y.o. M) Treating RN: Levora Dredge Primary Care Lesly Joslyn: CLINIC,  [place] Other Clinician: Referring Arinze Rivadeneira: Eppie Gibson Treating Flecia Shutter/Extender: Skipper Cliche in Treatment: 0 Clinic Level of Care Assessment Items TOOL 2 Quantity Score '[]'$  - Use when only an EandM is performed on the INITIAL visit 0 ASSESSMENTS - Nursing Assessment / Reassessment X - General Physical Exam (combine w/ comprehensive assessment (listed just below) when performed on new 1 20 pt. evals) X- 1 25 Comprehensive Assessment (HX, ROS, Risk Assessments, Wounds Hx, etc.) ASSESSMENTS - Wound and Skin Assessment / Reassessment '[]'$  - Simple Wound Assessment / Reassessment - one wound 0 '[]'$  - 0 Complex Wound Assessment / Reassessment - multiple wounds '[]'$  - 0 Dermatologic / Skin Assessment (not related to wound area) ASSESSMENTS - Ostomy and/or Continence Assessment and Care '[]'$  - Incontinence Assessment and Management 0 '[]'$  - 0 Ostomy Care Assessment and Management (repouching, etc.) PROCESS - Coordination of Care X - Simple Patient / Family Education for ongoing care 1 15 '[]'$  - 0 Complex (extensive) Patient / Family Education for ongoing care X- 1 10 Staff obtains Programmer, systems, Records, Test Results / Process Orders '[]'$  - 0 Staff telephones HHA, Nursing Homes / Clarify orders / etc '[]'$  - 0 Routine Transfer to another Facility (non-emergent condition) '[]'$  - 0 Routine Hospital Admission (non-emergent condition) X- 1 15 New Admissions / Biomedical engineer / Ordering NPWT, Apligraf, etc. '[]'$  - 0 Emergency Hospital Admission (emergent condition) X- 1 10 Simple Discharge Coordination '[]'$  - 0 Complex (extensive) Discharge Coordination PROCESS - Special Needs '[]'$  - Pediatric / Minor Patient Management 0 '[]'$  - 0 Isolation Patient Management '[]'$  - 0 Hearing / Language / Visual special needs '[]'$  - 0 Assessment of Community assistance (transportation, D/C planning, etc.) '[]'$  - 0 Additional assistance / Altered mentation '[]'$  - 0 Support Surface(s) Assessment (bed,  cushion, seat, etc.) INTERVENTIONS - Wound Cleansing / Measurement '[]'$  - Wound Imaging (photographs - any number of wounds) 0 '[]'$  - 0 Wound Tracing (instead of photographs) '[]'$  - 0 Simple Wound Measurement - one wound '[]'$  - 0 Complex Wound Measurement - multiple wounds Cheaney, Chukwuma L. ([id number]) '[]'$  - 0 Simple Wound Cleansing - one wound '[]'$  -  0 Complex Wound Cleansing - multiple wounds INTERVENTIONS - Wound Dressings '[]'$  - Small Wound Dressing one or multiple wounds 0 '[]'$  - 0 Medium Wound Dressing one or multiple wounds '[]'$  - 0 Large Wound Dressing one or multiple wounds '[]'$  - 0 Application of Medications - injection INTERVENTIONS - Miscellaneous '[]'$  - External ear exam 0 '[]'$  - 0 Specimen Collection (cultures, biopsies, blood, body fluids, etc.) '[]'$  - 0 Specimen(s) / Culture(s) sent or taken to Lab for analysis '[]'$  - 0 Patient Transfer (multiple staff / Civil Service fast streamer / Similar devices) '[]'$  - 0 Simple Staple / Suture removal (25 or less) '[]'$  - 0 Complex Staple / Suture removal (26 or more) '[]'$  - 0 Hypo / Hyperglycemic Management (close monitor of Blood Glucose) '[]'$  - 0 Ankle / Brachial Index (ABI) - do not check if billed separately Has the patient been seen at the hospital within the last three years: Yes Total Score: 95 Level Of Care: New/Established - Level 3 Electronic Signature(s) Signed: 09/19/2021 4:30:05 PM By: Levora Dredge Entered By: Levora Dredge on 09/19/2021 11:11:00 Silas, Enid Skeens ([id number]) -------------------------------------------------------------------------------- Encounter Discharge Information Details Patient Name: Shari Prows, Bartosz L. Date of Service: 09/19/2021 10:00 AM Medical Record Number: [id number] Patient Account Number: [id number] Date of Birth/Sex: May 11, 1960 (61 y.o. M) Treating RN: Levora Dredge Primary Care Ramone Gander: CLINIC, [place] Other Clinician: Referring Raelie Lohr: Eppie Gibson Treating Analina Filla/Extender: Skipper Cliche in  Treatment: 0 Encounter Discharge Information Items Discharge Condition: Stable Ambulatory Status: Cane Discharge Destination: Home Transportation: Private Auto Accompanied By: self Schedule Follow-up Appointment: Yes Clinical Summary of Care: Electronic Signature(s) Signed: 09/19/2021 4:30:05 PM By: Levora Dredge Entered By: Levora Dredge on 09/19/2021 11:12:14 [place], Enid Skeens ([id number]) -------------------------------------------------------------------------------- Lower Extremity Assessment Details Patient Name: Profit, Quavis L. Date of Service: 09/19/2021 10:00 AM Medical Record Number: [id number] Patient Account Number: [id number] Date of Birth/Sex: 1961/02/27 (61 y.o. M) Treating RN: Levora Dredge Primary Care Robyn Nohr: CLINIC, [place] Other Clinician: Referring Jaziyah Gradel: Eppie Gibson Treating Aleecia Tapia/Extender: Jeri Cos Weeks in Treatment: 0 Electronic Signature(s) Signed: 09/19/2021 4:30:05 PM By: Levora Dredge Entered By: Levora Dredge on 09/19/2021 10:23:19 Hage, Enid Skeens ([id number]) -------------------------------------------------------------------------------- Multi Wound Chart Details Patient Name: Stefanik, Taishawn L. Date of Service: 09/19/2021 10:00 AM Medical Record Number: [id number] Patient Account Number: [id number] Date of Birth/Sex: [birth year] (61 y.o. M) Treating RN: Levora Dredge Primary Care Rahsaan Weakland: CLINIC, [place] Other Clinician: Referring Darolyn Double: Eppie Gibson Treating Aeneas Longsworth/Extender: Skipper Cliche in Treatment: 0 Vital Signs Height(in): 70 Pulse(bpm): 66 Weight(lbs): 121 Blood Pressure(mmHg): 139/82 Body Mass Index(BMI): 17.4 Temperature(F): 97.5 Respiratory Rate(breaths/min): 18 Wound Assessments Treatment Notes Electronic Signature(s) Signed: 09/19/2021 4:30:05 PM By: Levora Dredge Entered By: Levora Dredge on 09/19/2021 10:43:43 Porada, Enid Skeens  ([id number]) -------------------------------------------------------------------------------- [place] Details Patient Name: Shari Prows, Oiva L. Date of Service: 09/19/2021 10:00 AM Medical Record Number: [id number] Patient Account Number: [id number] Date of Birth/Sex: March 06, 1961 (61 y.o. M) Treating RN: Levora Dredge Primary Care Ashey Tramontana: CLINIC, [place] Other Clinician: Referring Jashira Cotugno: Eppie Gibson Treating Arriana Lohmann/Extender: Skipper Cliche in Treatment: 0 Active Inactive Nutrition Nursing Diagnoses: Imbalanced nutrition Potential for alteratiion in Nutrition/Potential for imbalanced nutrition Goals: Patient/caregiver agrees to and verbalizes understanding of need to obtain nutritional consultation Date Initiated: 09/19/2021 Target Resolution Date: 10/03/2021 Goal Status: Active Patient/caregiver agrees to and verbalizes understanding of need to use nutritional supplements and/or vitamins as prescribed Date Initiated: 09/19/2021 Target Resolution Date: 10/17/2021 Goal Status: Active Patient/caregiver verbalizes understanding of need to maintain therapeutic glucose control per primary care physician Date Initiated: 09/19/2021 Target Resolution  Date: 10/17/2021 Goal Status: Active Patient/caregiver will maintain therapeutic glucose control Date Initiated: 09/19/2021 Target Resolution Date: 10/17/2021 Goal Status: Active Interventions: Assess patient nutrition upon admission and as needed per policy Provide education on nutrition Notes: Orientation to the Wound Care Program Nursing Diagnoses: Knowledge deficit related to the wound healing center program Goals: Patient/caregiver will verbalize understanding of the [place] Program Date Initiated: 09/19/2021 Target Resolution Date: 09/26/2021 Goal Status: Active Interventions: Provide education on orientation to the wound center Notes: Electronic Signature(s) Signed: 09/19/2021 4:30:05 PM By:  Levora Dredge Entered By: Levora Dredge on 09/19/2021 10:53:58 Garis, Quinn Carlean Jews ([id number]) -------------------------------------------------------------------------------- Pain Assessment Details Patient Name: Badger, Melo L. Date of Service: 09/19/2021 10:00 AM Medical Record Number: [id number] Patient Account Number: [id number] Date of Birth/Sex: 16-Dec-1960 (61 y.o. M) Treating RN: Levora Dredge Primary Care Laquitha Heslin: CLINIC, [place] Other Clinician: Referring Kensley Lares: Eppie Gibson Treating Janasia Coverdale/Extender: Skipper Cliche in Treatment: 0 Active Problems Location of Pain Severity and Description of Pain Patient Has Paino No Site Locations Rate the pain. Current Pain Level: 0 Pain Management and Medication Current Pain Management: Electronic Signature(s) Signed: 09/19/2021 4:30:05 PM By: Levora Dredge Entered By: Levora Dredge on 09/19/2021 10:12:04 Hakeem, Enid Skeens ([id number]) -------------------------------------------------------------------------------- Patient/Caregiver Education Details Patient Name: Shari Prows, Aldrin L. Date of Service: 09/19/2021 10:00 AM Medical Record Number: [id number] Patient Account Number: [id number] Date of Birth/Gender: 1960/06/22 (61 y.o. M) Treating RN: Levora Dredge Primary Care Physician: CLINIC, [place] Other Clinician: Referring Physician: Eppie Gibson Treating Physician/Extender: Skipper Cliche in Treatment: 0 Education Assessment Education Provided To: Patient Education Topics Provided Hyperbaric Oxygenation: Handouts: Hyperbaric Oxygen, Other: resp therapy spoke with pt Methods: Explain/Verbal Responses: State content correctly Welcome To The [place]: Handouts: Welcome To [facility] [place] Methods: Explain/Verbal Responses: State content correctly Electronic Signature(s) Signed: 09/19/2021 4:30:05 PM By: Levora Dredge Entered By: Levora Dredge on 09/19/2021 11:11:39 Neto, Enid Skeens ([id number]) -------------------------------------------------------------------------------- Vitals Details Patient Name: Shari Prows, Bryker L. Date of Service: 09/19/2021 10:00 AM Medical Record Number: [id number] Patient Account Number: [id number] Date of Birth/Sex: 1960/07/23 (61 y.o. M) Treating RN: Levora Dredge Primary Care Deklen Popelka: CLINIC, [place] Other Clinician: Referring Feliza Diven: Eppie Gibson Treating Evamaria Detore/Extender: Skipper Cliche in Treatment: 0 Vital Signs Time Taken: 10:12 Temperature (F): 97.5 Height (in): 70 Pulse (bpm): 66 Source: Stated Respiratory Rate (breaths/min): 18 Weight (lbs): 121 Blood Pressure (mmHg): 139/82 Source: Stated Reference Range: 80 - 120 mg / dl Body Mass Index (BMI): 17.4 Electronic Signature(s) Signed: 09/19/2021 4:30:05 PM By: Levora Dredge Entered By: Levora Dredge on 09/19/2021 10:14:20

## 2021-09-19 NOTE — Progress Notes (Signed)
SENA, CLOUATRE ([id number]) Visit Report for 09/19/2021 Abuse Risk Screen Details Patient Name: [name], Willie L. Date of Service: 09/19/2021 10:00 AM Medical Record Number: [id number] Patient Account Number: [id number] Date of Birth/Sex: 07-27-1960 (61 y.o. M) Treating RN: Levora Dredge Primary Care Sufyaan Palma: CLIN[facility], [place] Other Clinician: Referring Efstathios Sawin: Eppie Gibson Treating Tamaka Sawin/Extender: Skipper Cliche in Treatment: 0 Abuse Risk Screen Items Answer ABUSE RISK SCREEN: Has anyone close to you tried to hurt or harm you recentlyo No Do you feel uncomfortable with anyone in your familyo No Has anyone forced you do things that you didnot want to doo No Electronic Signature(s) Signed: 09/19/2021 4:30:05 PM By: Levora Dredge Entered By: Levora Dredge on 09/19/2021 10:20:52 Dicenzo, [name] ([id number]) -------------------------------------------------------------------------------- Activities of Daily Living Details Patient Name: Ramos, Willie L. Date of Service: 09/19/2021 10:00 AM Medical Record Number: [id number] Patient Account Number: [id number] Date of Birth/Sex: [birth year] (61 y.o. M) Treating RN: Levora Dredge Primary Care Nic Lampe: [facility], [place] Other Clinician: Referring Shontay Wallner: Eppie Gibson Treating Dishon Kehoe/Extender: Skipper Cliche in Treatment: 0 Activities of Daily Living Items Answer Activities of Daily Living (Please select one for each item) Drive Automobile Completely Able Take Medications Completely Able Use Telephone Completely Able Care for Appearance Completely Able Use Toilet Completely Able Bath / Shower Completely Able Dress Self Completely Able Feed Self Completely Able Walk Completely Able Get In / Out Bed Completely Able Housework Completely Able Prepare Meals Completely Able Handle Money Completely Able Shop for Self Completely Able Electronic Signature(s) Signed: 09/19/2021 4:30:05 PM By: Levora Dredge Entered By: Levora Dredge on 09/19/2021 10:21:14 Mcinturff, Enid Skeens ([id number]) -------------------------------------------------------------------------------- Education Screening Details Patient Name: Shari Ramos, Willie L. Date of Service: 09/19/2021 10:00 AM Medical Record Number: [id number] Patient Account Number: [id number] Date of Birth/Sex: Jan 25, 1961 (61 y.o. M) Treating RN: Levora Dredge Primary Care Manaal Mandala: [facility], [place] Other Clinician: Referring Tahjay Binion: Eppie Gibson Treating Mistina Coatney/Extender: Skipper Cliche in Treatment: 0 Learning Preferences/Education Level/Primary Language Learning Preference: Explanation, Demonstration, Video, Communication Board, Printed Material Preferred Language: English Cognitive Barrier Language Barrier: No Translator Needed: No Memory Deficit: No Emotional Barrier: No Cultural/Religious Beliefs Affecting Medical Care: No Physical Barrier Impaired Vision: Yes Impaired Hearing: No Decreased Hand dexterity: No Knowledge/Comprehension Knowledge Level: High Comprehension Level: High Ability to understand written instructions: High Ability to understand verbal instructions: High Motivation Anxiety Level: Calm Cooperation: Cooperative Education Importance: Acknowledges Need Interest in Health Problems: Asks Questions Perception: Coherent Willingness to Engage in Self-Management High Activities: Readiness to Engage in Self-Management High Activities: Electronic Signature(s) Signed: 09/19/2021 4:30:05 PM By: Levora Dredge Entered By: Levora Dredge on 09/19/2021 10:21:40 Schneeberger, Enid Skeens ([id number]) -------------------------------------------------------------------------------- Fall Risk Assessment Details Patient Name: Ramos, Willie L. Date of Service: 09/19/2021 10:00 AM Medical Record Number: [id number] Patient Account Number: [id number] Date of Birth/Sex: 05/24/1960 (61 y.o. M) Treating RN: Levora Dredge Primary Care Shai Rasmussen: [facility], [place] Other Clinician: Referring Lacoya Wilbanks: Eppie Gibson Treating Maddyx Vallie/Extender: Skipper Cliche in Treatment: 0 Fall Risk Assessment Items Have you had 2 or more falls in the last 12 monthso 0 No Have you had any fall that resulted in injury in the last 12 monthso 0 No FALLS RISK SCREEN History of falling - immediate or within 3 months 0 No Secondary diagnosis (Do you have 2 or more medical diagnoseso) 0 No Ambulatory aid None/bed rest/wheelchair/nurse 0 Yes Crutches/cane/walker 0 No Furniture 0 No Intravenous therapy Access/Saline/Heparin Lock 0 No Gait/Transferring Normal/ bed rest/ wheelchair 0 Yes Weak (short steps with or without shuffle, stooped but able to  lift head while walking, may 0 No seek support from furniture) Impaired (short steps with shuffle, may have difficulty arising from chair, head down, impaired 0 No balance) Mental Status Oriented to own ability 0 Yes Electronic Signature(s) Signed: 09/19/2021 4:30:05 PM By: Levora Dredge Entered By: Levora Dredge on 09/19/2021 10:21:49 Weldon, Tavius Carlean Ramos ([id number]) -------------------------------------------------------------------------------- Foot Assessment Details Patient Name: Ramos, Willie L. Date of Service: 09/19/2021 10:00 AM Medical Record Number: [id number] Patient Account Number: [id number] Date of Birth/Sex: Aug 01, 1960 (61 y.o. M) Treating RN: Levora Dredge Primary Care Melissaann Dizdarevic: [facility], [place] Other Clinician: Referring Ivis Henneman: Eppie Gibson Treating Anissia Wessells/Extender: Skipper Cliche in Treatment: 0 Foot Assessment Items Site Locations + = Sensation present, - = Sensation absent, C = Callus, U = Ulcer R = Redness, W = Warmth, M = Maceration, PU = Pre-ulcerative lesion F = Fissure, S = Swelling, D = Dryness Assessment Right: Left: Other Deformity: No No Prior Foot Ulcer: No No Prior Amputation: No No Charcot Joint: No  No Ambulatory Status: Gait: Notes not completed due to location of wound on jaw Electronic Signature(s) Signed: 09/19/2021 4:30:05 PM By: Levora Dredge Entered By: Levora Dredge on 09/19/2021 10:23:10 Farewell, Enid Skeens ([id number]) -------------------------------------------------------------------------------- Nutrition Risk Screening Details Patient Name: Varone, Jonnatan L. Date of Service: 09/19/2021 10:00 AM Medical Record Number: [id number] Patient Account Number: [id number] Date of Birth/Sex: 09/10/1960 (61 y.o. M) Treating RN: Levora Dredge Primary Care Brittan Butterbaugh: CL[facility], [place] Other Clinician: Referring Beatrix Breece: Eppie Gibson Treating Percy Comp/Extender: Skipper Cliche in Treatment: 0 Height (in): 70 Weight (lbs): 121 Body Mass Index (BMI): 17.4 Nutrition Risk Screening Items Score Screening NUTRITION RISK SCREEN: I have an illness or condition that made me change the kind and/or amount of food I eat 2 Yes I eat fewer than two meals per day 0 No I eat few fruits and vegetables, or milk products 0 No I have three or more drinks of beer, liquor or wine almost every day 0 No I have tooth or mouth problems that make it hard for me to eat 2 Yes I don't always have enough money to buy the food I need 0 No I eat alone most of the time 0 No I take three or more different prescribed or over-the-counter drugs a day 0 No Without wanting to, I have lost or gained 10 pounds in the last six months 2 Yes I am not always physically able to shop, cook and/or feed myself 0 No Nutrition Protocols Good Risk Protocol Moderate Risk Protocol 0 Provide education on nutrition High Risk Proctocol Risk Level: High Risk Score: 6 Notes pt states appititie is good but keeps losing and cant put on weight Electronic Signature(s) Signed: 09/19/2021 4:30:05 PM By: Levora Dredge Entered By: Levora Dredge on 09/19/2021 10:22:49

## 2021-09-19 NOTE — Progress Notes (Addendum)
JHONATHAN, DESROCHES ([id number]) Visit Report for 09/19/2021 Chief Complaint Document Details Patient Name: ESAUL, DORWART L. Date of Service: 09/19/2021 10:00 AM Medical Record Number: [id number] Patient Account Number: [id number] Date of Birth/Sex: Nov 26, 1960 (61 y.o. 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 Date of Service: 09/19/2021 10:00 AM Medical Record Number: [id number] Patient Account Number: [id number] Date of Birth/Sex: 1961-02-25 (61 y.o. M) Treating RN: Levora Dredge Primary Care Provider: CLIN[facility], [place] Other Clinician: Referring Provider: Eppie Gibson Treating Provider/Extender: Skipper Cliche in Treatment: 0 History of Present Illness HPI Description: 09-19-2021 patient presents today for initial evaluation here in our clinic concerning a referral from Dr. Isidore Moos this is a 61 year old gentleman who unfortunately has been having a lot of issues with his jaw. 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 He also has a history of diabetes mellitus type 2, hypertension, coronary artery disease, and again he has a personal history of radiation therapy which was actually completed on 09-27-2019 and this again was with Dr. Isidore Moos. He underwent surgical resection of the cancer with partial mandibulectomy with mandible  reconstruction and bone plating. The patient had bilateral neck dissection, tracheostomy, and free flap reconstruction with Dr. Hendricks Limes at [facility] 06/08/2019. This was a pT4aN1M0 SCC of mouth with mandibular invasion. Subsequently the patient did undergo mandibular hardware removal as well as bone debridement and this appears to have been around 01-30-2021. Subsequently the patient had IV antibiotics during this time for osteomyelitis by Dr. Leamon Arnt at the [facility] He was continue to follow-up with Dr. Isidore Moos. Patient's last radiation oncology visit was 02-21-2021 with Dr. Isidore Moos and there was no evidence of cancerous disease at that point. He was still having dysphagia due to all the extensive surgery that has undergone in regard to his oral cavity. Subsequently they started to notice dental issues and felt that he needed follow-up in that regard. Upon the referral that we received which was on 08-26-2021 the patient was presenting for dental consultation/examination to evaluate for dentition. The end result of this visit was that the patient did have a recommendation for extraction of teeth due to the issue with evidence of infection he has current osteomyelitis and subsequently also with the extraction will be prone to more extensive infection spreading throughout. Subsequently it was recommended that he have a Marx protocol performed prior to extraction in order to decrease the risk and minimize complications from the surgery this would indicate that he needs 20 HBO treatments prior to surgery then has the surgery followed by 10 HBO treatments following that time. It is documented that the patient does have osteoradionecrosis as well as osteomyelitis secondary to the head and neck radiation. That is the reason that he is seeing [place] today in order to see news. The HBO therapy at this point. Based on everything I am seeing and what I discussed with the patient today this seems  reasonable to me especially in light of the extensive surgery that is going to be undertaken for him.  Electronic Signature(s) Signed: 09/19/2021 11:32:05 AM By: Worthy Keeler PA-C Entered By: Worthy Keeler on 09/19/2021 11:32:05 Reach, Enid Skeens ([id number]) -------------------------------------------------------------------------------- Physical Exam Details Patient Name: Boardman, Dalan L. Date of Service: 09/19/2021 10:00 AM Medical Record Number: [id number] Patient Account Number: [id number] Date of Birth/Sex: [birth year] (61 y.o. M) Treating RN: Levora Dredge Primary Care Provider: CL[facility], [place] Other Clinician: Referring Provider: Eppie Gibson Treating Provider/Extender: Skipper Cliche in Treatment: 0 Constitutional sitting or standing blood pressure is within target range for patient.. pulse regular and within target range for patient.Marland Kitchen respirations regular, non- labored and within target range for patient.Marland Kitchen temperature within target range for patient.. Well-nourished and well-hydrated in no acute distress. Eyes conjunctiva clear no eyelid edema noted. pupils equal round and reactive to light and accommodation. Ears, Nose, Mouth, and Throat no gross abnormality of ear auricles or external auditory canals. normal hearing noted during conversation. Respiratory normal breathing without difficulty. Musculoskeletal normal gait and posture. no significant deformity or arthritic changes, no loss or range of motion, no clubbing. Psychiatric this patient is able to make decisions and demonstrates good insight into disease process. Alert and Oriented x 3. pleasant and cooperative. Notes Upon evaluation patient appears to be doing somewhat poorly in regard to his mouth he tells me that he does have an area on his right lower jaw where he has some drainage at times nothing appears to be open at this point although this apparently has been open in the past. He otherwise seems to  be stable as far as vital signs are concerned which is good news. 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 Nonetheless I do think that the patient seems to be an appropriate candidate for HBO therapy based on what I am seeing in presuming his chest x-ray and EKG come back okay. That is good to be the 1 thing that we do need to evaluate prior to initiation of therapy and that was discussed with the patient today. Electronic Signature(s) Signed: 09/19/2021 1:18:18 PM By: Worthy Keeler PA-C Entered By: Worthy Keeler on 09/19/2021 13:18:18 Valadez, Enid Skeens ([id number]) -------------------------------------------------------------------------------- Physician Orders Details Patient Name: Shari Prows, Chananya L. Date of Service: 09/19/2021 10:00 AM Medical Record Number: [id number] Patient Account Number: [id number] Date of Birth/Sex: 1960-03-31 (61 y.o. 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 o One treatment per day (delivered Monday through Friday unless otherwise specified in Special Instructions below):   o Total # of Treatments: - 30, 20 prior to surgery, 10 post surgery o Finger stick Blood Glucose Pre- and Post- HBOT Treatment. o Follow Hyperbaric Oxygen Glycemia Protocol o Other - Libre approved for 2ATA only Radiology o X-ray, Chest - needed prior to HBO treatment Services and Therapies o Electrocardiogram (EKG) - needed prior to HBO treatment GLYCEMIA INTERVENTIONS PROTOCOL PRE-HBO GLYCEMIA INTERVENTIONS ACTION INTERVENTION Obtain pre-HBO capillary blood glucose (ensure 1 physician order is in chart). A. Notify HBO physician and await physician orders. 2 If result is 70 mg/dl or below: B. If the result meets the hospital definition of a critical result, follow hospital policy. A. Give patient an 8 ounce Glucerna Shake, an 8 ounce Ensure, or 8 ounces of a Glucerna/Ensure equivalent dietary supplement*. B. Wait 30 minutes. If result is 71 mg/dl to 130 mg/dl: C. Retest patientos capillary blood glucose (CBG). D. If result greater than or equal to 110 mg/dl, proceed with HBO. If result less than 110 mg/dl, notify HBO physician and consider holding HBO. If result is 131 mg/dl to 249 mg/dl: A. Proceed with HBO. A. Notify HBO physician and await physician orders. B. It is recommended to hold HBO and do If result is 250 mg/dl or greater: blood/urine ketone testing. C. If the result meets the hospital definition of a critical result, follow hospital policy. [place] ([id number]) POST-HBO GLYCEMIA INTERVENTIONS ACTION INTERVENTION Obtain post HBO capillary blood glucose (ensure 1 physician order is in chart). A. Notify HBO physician and await physician orders. 2 If result is 70  mg/dl or below: B. If the result meets the hospital definition of a critical result, follow hospital policy. A. Give patient an 8 ounce Glucerna Shake, an 8 ounce Ensure, or 8 ounces of a Glucerna/Ensure equivalent dietary supplement*. B. Wait 15 minutes for symptoms of hypoglycemia (i.e. nervousness, anxiety, If result is 71 mg/dl to 100 mg/dl: sweating, chills, clamminess, irritability, confusion, tachycardia or dizziness). C. If patient asymptomatic, discharge patient. If patient symptomatic, repeat capillary blood glucose (CBG) and notify HBO physician. If result is 101 mg/dl to 249 mg/dl: A. Discharge patient. A. Notify HBO physician and await physician orders. B. It is recommended to do blood/urine If result is 250 mg/dl or greater: ketone testing. C. If the result meets the hospital definition of a critical result, follow hospital policy. *Juice or candies are NOT equivalent products. If patient refuses the Glucerna or Ensure, please consult the hospital dietitian for an appropriate substitute. Electronic Signature(s) Signed: 10/16/2021 5:06:06 PM By: Gretta Cool, BSN, RN, CWS, Kim RN, BSN Signed: 12/12/2021 12:26:54 PM By: Worthy Keeler PA-C Previous Signature: 09/19/2021 4:30:05 PM Version By: Levora Dredge Previous Signature: 09/19/2021 5:04:18 PM Version By: Worthy Keeler PA-C Entered By: Gretta Cool BSN, RN, CWS, Kim on 10/16/2021 17:06:06 BALIN, VANDEGRIFT ([id number]) -------------------------------------------------------------------------------- Prescription 09/19/2021 Patient Name: Shari Prows, Ulis L. Provider: Jeri Cos PA-C Date of Birth: [birth year] NPI#: [id number] Sex: Jerilynn Mages DEA#: [id number] Phone #: [phone] License #: Patient Address: [facility] [place], [place] [address] [address], [place], [place] [address] [phone] Allergies morphine Provider's  Orders o X-ray, Chest - needed prior to HBO treatment Hand Signature: Date(s): CORBET, HANLEY ([id number]) Prescription 09/19/2021 Patient Name: FRANKIE, SCIPIO. Provider: Jeri Cos PA-C Date of Birth: 1960/04/15 NPI#: [id number] Sex: Jerilynn Mages DEA#: [id number] Phone #: [phone] License #: Patient Address: [facility]  [place], [place] [address] [address], [place],  [address] [phone] Allergies morphine Provider's Orders o Electrocardiogram (EKG) - needed prior to HBO treatment Hand Signature: Date(s): Electronic Signature(s) Signed: 10/17/2021 3:42:41 PM By: Gretta Cool, BSN, RN, CWS, Kim RN, BSN Signed: 12/12/2021 12:26:54 PM By: Worthy Keeler PA-C Previous Signature: 09/19/2021 4:30:05 PM Version By: Levora Dredge Previous Signature: 09/19/2021 5:04:18 PM Version By: Worthy Keeler PA-C Entered By: Gretta Cool, BSN, RN, CWS, Kim on 10/16/2021 17:06:06 Carroll, Enid Skeens ([id number]) --------------------------------------------------------------------------------  Problem List Details Patient Name: List, Lyncoln L. Date of Service: 09/19/2021 10:00 AM Medical Record Number: [id number] Patient Account Number: [id number] Date of Birth/Sex: 1960/11/22 (61 y.o. 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 09/19/2021 No Yes I10 Essential (primary) hypertension 09/19/2021 No Yes I25.10 Atherosclerotic heart disease of native coronary  artery without angina 09/19/2021 No Yes pectoris E11.69 Type 2 diabetes mellitus with other specified complication 08/16/6946 No Yes Inactive Problems Resolved Problems Electronic Signature(s) Signed: 09/19/2021 10:57:46 AM By: Worthy Keeler PA-C Previous Signature: 09/19/2021 10:40:01 AM Version By: Worthy Keeler PA-C Entered By: Worthy Keeler on 09/19/2021 10:57:46 Gerald, Enid Skeens ([id number]) -------------------------------------------------------------------------------- Progress Note Details Patient Name: Grandmaison, Josecarlos L. Date of Service: 09/19/2021 10:00 AM Medical Record Number: [id number] Patient Account Number: [id number] Date of Birth/Sex: [birth year] (61 y.o. 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 Isidore Moos this is a 61 year old gentleman who unfortunately has been having a lot of issues with his jaw. 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 He also has a history of diabetes mellitus type 2, hypertension, coronary artery disease, and again he has a personal history of radiation therapy which was actually completed on 09-27-2019 and this again was with Dr. Isidore Moos. He underwent surgical resection of the cancer with partial mandibulectomy with mandible reconstruction and bone plating. The patient had bilateral neck dissection, tracheostomy, and free flap reconstruction with Dr. Hendricks Limes at [facility] 06/08/2019. This was a pT4aN1M0 SCC of  mouth with mandibular invasion. Subsequently the patient did undergo mandibular hardware removal as well as bone debridement and this appears to have been around 01-30-2021. Subsequently the patient had IV antibiotics during this time for osteomyelitis by Dr. Leamon Arnt at the [facility]. He was continue to follow-up with Dr. Isidore Moos. Patient's last radiation oncology visit was 02-21-2021 with Dr. Isidore Moos and there was no evidence of cancerous disease at that point. He was still having dysphagia due to all the extensive surgery that has undergone in regard to his oral cavity. Subsequently they started to notice dental issues and felt that he needed follow-up in that regard. Upon the referral that we received which was on 08-26-2021 the patient was presenting for dental  consultation/examination to evaluate for dentition. The end result of this visit was that the patient did have a recommendation for extraction of teeth due to the issue with evidence of infection he has current osteomyelitis and subsequently also with the extraction will be prone to more extensive infection spreading throughout. Subsequently it was recommended that he have a Marx protocol performed prior to extraction in order to decrease the risk and minimize complications from the surgery this would indicate that he needs 20 HBO treatments prior to surgery then has the surgery followed by 10 HBO treatments following that time. It is documented that the patient does have osteoradionecrosis as well as osteomyelitis secondary to the head and neck radiation. That is the reason that he is seeing [place] today in order to see news. The HBO therapy at this point. Based on everything I am seeing and what I discussed with the patient today this seems reasonable to me especially in light of the extensive surgery that is going to be undertaken for him. Patient History Information obtained from Patient. Allergies morphine (Reaction: itching) Social  History Former smoker - quit monday, Alcohol Use - Moderate, Drug Use - No History, Caffeine Use - Daily. Medical History Cardiovascular Patient has history of Coronary Artery Disease, Hypertension, Myocardial Infarction Endocrine Patient has history of Type II Diabetes Musculoskeletal Patient has history of Osteomyelitis Neurologic Patient has history of Seizure Disorder Oncologic Patient has history of Received Radiation Denies history of Received Chemotherapy Patient is treated with Insulin, Oral Agents. Blood sugar is tested. Review of Systems (ROS) Constitutional Symptoms (General Health) Denies complaints or symptoms of Fatigue, Fever, Chills, Marked Weight Change. Eyes Complains or has symptoms of Glasses / Contacts. Denies complaints or symptoms of Vision Changes. Ear/Nose/Mouth/Throat Denies complaints or symptoms of Difficult clearing ears, Sinusitis, cancer of the mouth Hematologic/Lymphatic Denies complaints or symptoms of Bleeding / Clotting Disorders, Human Immunodeficiency Virus. Respiratory Denies complaints or symptoms of Chronic or frequent coughs, Shortness of Breath. Gastrointestinal Denies complaints or symptoms of Frequent diarrhea, Nausea, Vomiting. [place] ([id number]) Genitourinary AKI Immunological Denies complaints or symptoms of Hives, Itching. Integumentary (Skin) Denies complaints or symptoms of Wounds, Bleeding or bruising tendency, Breakdown, Swelling. Musculoskeletal Denies complaints or symptoms of Muscle Pain, Muscle Weakness. Neurologic hx stroke , neuromuscular disorder Oncologic CA mouth Psychiatric Complains or has symptoms of Anxiety. Objective Constitutional sitting or standing blood pressure is within target range for patient.. pulse regular and within target range for patient.Marland Kitchen respirations regular, non- labored and within target range for patient.Marland Kitchen temperature within target range for patient.. Well-nourished and  well-hydrated in no acute distress. Vitals Time Taken: 10:12 AM, Height: 70 in, Source: Stated, Weight: 121 lbs, Source: Stated, BMI: 17.4, Temperature: 97.5 F, Pulse: 66 bpm, Respiratory Rate: 18 breaths/min, Blood Pressure: 139/82 mmHg. Eyes conjunctiva clear no eyelid edema noted. pupils equal round and reactive to light and accommodation. Ears, Nose, Mouth, and Throat no gross abnormality of ear auricles or external auditory canals. normal hearing noted during conversation. Respiratory normal breathing without difficulty. Musculoskeletal normal gait and posture. no significant deformity or arthritic changes, no loss or range of motion, no clubbing. Psychiatric this patient is able to make decisions and demonstrates good insight into disease process. Alert and Oriented x 3. pleasant and cooperative. General Notes: Upon evaluation patient appears to be doing somewhat poorly in regard to his mouth he tells me that he does have an area on his right lower jaw where he has some drainage at times nothing appears  to be open at this point although this apparently has been open in the past. He otherwise seems to be stable as far as vital signs are concerned which is good news. 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 Nonetheless I do think that the patient seems to be an appropriate candidate for HBO therapy based on what I am seeing in presuming his chest x-ray and EKG come back okay. That is good to be the 1 thing that we do need to evaluate prior to initiation of therapy and that was discussed with the patient today. 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 ([id number]) Plan Follow-up Appointments: Return Appointment in: - HBO patient Hyperbaric Oxygen Therapy: Evaluate for HBO Therapy Indication and location: - patient has hx of mouth cancer, radiation, osteomyelitis of jaw If appropriate for treatment, begin HBOT per protocol: 2.5 ATA for 90 Minutes with 2 Five (5) Minute Air Breaks One treatment per day (delivered Monday through Friday unless otherwise specified in Special Instructions below): Total # of Treatments: - 30, 20 prior to surgery, 10 post surgery Finger stick Blood Glucose Pre- and Post- HBOT Treatment. Follow Hyperbaric Oxygen Glycemia Protocol Radiology ordered were: X-ray, Chest - needed prior to HBO treatment Services and Therapies ordered were: Electrocardiogram (EKG) - needed prior to HBO treatment 1. Based on what I am seeing currently I do believe that this patient is appropriate for basically the Marx protocol for HBO therapy for pre and postsurgical treatments. Subsequently we would basically focus on 20 treatments of HBO prior to complete dental extraction and then subsequently 10 treatments following to help this heal and prevent risk of severe infection. Again the patient does have chronic osteomyelitis along with osteoradionecrosis which is chronic of the mandible. This is secondary to his previous radiation therapy. We would need to dive him at 2.5 ATA in order to achieve the optimal treatment regimen to help with his surgical extraction. 2. Upon reading through his chart I am going to have him go for an x-ray as well as an EKG in order to ensure that there is nothing from a chronic medical standpoint that would prohibit him from going into HBO the patient is  aware of this and is in agreement with that plan. 3. I am going also recommend that we have the patient coordinate closely with his surgeon as far as treatments and surgical extraction is concerned. We will get a be in contact with them as well as far as a close relationship is concerned to make sure that everything is appropriately structured to give him the best outcome here. 4. I am going to send a copy of my note here today along with the records that we received and have reviewed regarding the issues that he is having. The patient is in agreement with that plan and we will send this to the [facility] to work towards gaining approval for HBO therapy once we get that we can then  discuss with the patient surgeon getting things set up as far as coordination of HBO and surgical intervention is concerned. Once he has a surgery I want him to at least be able to come for a Wednesday Thursday and Friday session of the same week which means the surgery really needs to be on a Monday or Tuesday so we get a right back in the chamber to help him heal effectively. We will see patient back for reevaluation in 1 week here in the clinic. If anything worsens or changes patient will contact our office for additional recommendations. Electronic Signature(s) Signed: 09/19/2021 1:20:58 PM By: Worthy Keeler PA-C Entered By: Worthy Keeler on 09/19/2021 13:20:58 Bowell, Enid Skeens ([id number]) -------------------------------------------------------------------------------- ROS/PFSH Details Patient Name: Shari Prows, Karlon L. Date of Service: 09/19/2021 10:00 AM Medical Record Number: [id number] Patient Account Number: [id number] Date of Birth/Sex: 1961-01-28 (61 y.o. M) Treating RN: Levora Dredge Primary Care Provider: [facility], [place] Other Clinician: Referring Provider: Eppie Gibson Treating Provider/Extender: Skipper Cliche in Treatment: 0 Information Obtained From Patient Constitutional Symptoms  (General Health) Complaints and Symptoms: Negative for: Fatigue; Fever; Chills; Marked Weight Change Eyes Complaints and Symptoms: Positive for: Glasses / Contacts Negative for: Vision Changes Ear/Nose/Mouth/Throat Complaints and Symptoms: Negative for: Difficult clearing ears; Sinusitis Review of System Notes: cancer of the mouth Hematologic/Lymphatic Complaints and Symptoms: Negative for: Bleeding / Clotting Disorders; Human Immunodeficiency Virus Respiratory Complaints and Symptoms: Negative for: Chronic or frequent coughs; Shortness of Breath Gastrointestinal Complaints and Symptoms: Negative for: Frequent diarrhea; Nausea; Vomiting Immunological Complaints and Symptoms: Negative for: Hives; Itching Integumentary (Skin) Complaints and Symptoms: Negative for: Wounds; Bleeding or bruising tendency; Breakdown; Swelling Musculoskeletal Complaints and Symptoms: Negative for: Muscle Pain; Muscle Weakness Medical History: Positive for: Osteomyelitis Psychiatric Complaints and Symptoms: Positive for: Anxiety Review of System Notes: Bazaldua, Deroy L. ([id number]) suicidal ideation, schizophrenia Cardiovascular Medical History: Positive for: Coronary Artery Disease; Hypertension; Myocardial Infarction Endocrine Medical History: Positive for: Type II Diabetes Time with diabetes: 4-5 years Treated with: Insulin, Oral agents Blood sugar tested every day: Yes Tested : 3 times daily Genitourinary Complaints and Symptoms: Review of System Notes: AKI Neurologic Complaints and Symptoms: Review of System Notes: hx stroke , neuromuscular disorder Medical History: Positive for: Seizure Disorder Oncologic Complaints and Symptoms: Review of System Notes: CA mouth Medical History: Positive for: Received Radiation Negative for: Received Chemotherapy Immunizations Pneumococcal Vaccine: Received Pneumococcal Vaccination: No Implantable Devices None Family and Social  History Former smoker - quit monday; Alcohol Use: Moderate; Drug Use: No History; Caffeine Use: Daily Electronic Signature(s) Signed: 09/19/2021 4:30:05 PM By: Levora Dredge Signed: 09/19/2021 5:04:18 PM By: Worthy Keeler PA-C Entered By: Levora Dredge on 09/19/2021 13:43:22 Gulledge, Enid Skeens ([id number]) -------------------------------------------------------------------------------- SuperBill Details Patient Name: Harpenau, Kapena L. Date of Service: 09/19/2021 Medical Record Number: [id number] Patient Account Number: [id number] Date of Birth/Sex: 1960/11/14 (61 y.o. 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 I10 Essential (primary) hypertension I25.10 Atherosclerotic heart disease of native coronary artery without angina pectoris E11.69 Type 2 diabetes mellitus with other specified complication Facility Procedures CPT4 Code: 43329518 Description: 99213 - WOUND CARE VISIT-LEV 3 EST PT Modifier: Quantity: 1 Physician Procedures CPT4 Code: 8416606 Description: 30160 - WC PHYS LEVEL 4 -  NEW PT Modifier: Quantity: 1 CPT4 Code: Description: ICD-10 Diagnosis Description M87.88 Other osteonecrosis, other site M86.48 Chronic osteomyelitis with draining sinus, other site Z92.3 Personal history of irradiation 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

## 2021-09-19 NOTE — Progress Notes (Signed)
DEMIR, TITSWORTH ([id number]) Visit Report for 09/19/2021 HBO Patient Questionnaire Details Patient Name: [name], [name]. Date of Service: 09/19/2021 10:00 AM Medical Record Number: [id number] Patient Account Number: [id number] Date of Birth/Sex: Dec 19, 1960 (61 y.o. M) Treating RN: Levora Dredge Primary Care Sayuri Rhames: [facility], [place] Other Clinician: Referring Timothee Gali: Eppie Gibson Treating Jonta Gastineau/Extender: Skipper Cliche in Treatment: 0 HBO Patient Questionnaire Items Answer Any "Yes" answers must be brought to the hyperbaric physician's attention. Breathing or Lung problemso No Currently use tobacco productso No Used tobacco products in the pasto Yes Heart problemso No Do you take water pills (diuretic)o No Diabeteso Yes On Diabetes pillo Yes If yes, last time taken: 09/19/2021 On Insulino Yes If yes, last time taken: 09/19/2021 Dialysiso No Eye problems like glaucomao No Ear problems or surgeryo No Sinus Problemso No Cancero Yes List the location: head, neck, mouth Radiation therapy for cancero Yes Last treatment for radiation therapyo 01/01/02022 Chemotherapy for cancero No Confinement Anxiety (Claustrophobia- fear of confined places)o No Any medical implants/devices that are fully or partially implanted or attached to your bodyo No Pregnanto No Seizureso Yes Date of last known seizure: 09/13/2017 Date of last: Chest x-ray: 03/17/2019 EKG: 03/17/2019 Electronic Signature(s) Signed: 09/19/2021 4:30:05 PM By: Levora Dredge Entered By: Levora Dredge on 09/19/2021 10:58:45

## 2021-09-24 ENCOUNTER — Other Ambulatory Visit (HOSPITAL_COMMUNITY): Payer: Self-pay

## 2021-09-24 DIAGNOSIS — Z5189 Encounter for other specified aftercare: Secondary | ICD-10-CM

## 2021-09-25 ENCOUNTER — Other Ambulatory Visit (HOSPITAL_COMMUNITY): Payer: Self-pay

## 2021-09-25 ENCOUNTER — Ambulatory Visit: Payer: No Typology Code available for payment source | Admitting: Physician Assistant

## 2021-09-25 ENCOUNTER — Other Ambulatory Visit (HOSPITAL_COMMUNITY): Payer: Self-pay | Admitting: Physician Assistant

## 2021-09-25 DIAGNOSIS — Z9289 Personal history of other medical treatment: Secondary | ICD-10-CM

## 2021-09-26 ENCOUNTER — Ambulatory Visit (HOSPITAL_COMMUNITY)
Admission: RE | Admit: 2021-09-26 | Discharge: 2021-09-26 | Disposition: A | Discharge status: Home / Self Care | Payer: Medicare PPO | Source: Ambulatory Visit | Attending: Physician Assistant | Admitting: Physician Assistant

## 2021-09-26 DIAGNOSIS — Z9289 Personal history of other medical treatment: Secondary | ICD-10-CM | POA: Diagnosis present

## 2021-10-08 ENCOUNTER — Ambulatory Visit
Admission: RE | Admit: 2021-10-08 | Discharge: 2021-10-08 | Disposition: A | Discharge status: Home / Self Care | Payer: Medicare PPO | Source: Ambulatory Visit | Attending: Physician Assistant | Admitting: Physician Assistant

## 2021-10-08 ENCOUNTER — Ambulatory Visit
Admission: RE | Admit: 2021-10-08 | Discharge: 2021-10-08 | Disposition: A | Discharge status: Home / Self Care | Payer: Medicare PPO | Attending: Physician Assistant | Admitting: Physician Assistant

## 2021-10-08 DIAGNOSIS — Z9289 Personal history of other medical treatment: Secondary | ICD-10-CM | POA: Insufficient documentation

## 2021-10-16 ENCOUNTER — Other Ambulatory Visit: Payer: Self-pay

## 2021-10-16 DIAGNOSIS — C040 Malignant neoplasm of anterior floor of mouth: Secondary | ICD-10-CM

## 2021-11-27 IMAGING — CT CT NECK W/ CM
3 of 4 series · 12 of 33 positions shown, 14 images · IV contrast (omnipaque)
Comparison: CT head without contrast 05/14/2019

CLINICAL DATA: Lymphadenopathy of the neck. Difficulty swallowing
talking. Dizziness.

EXAM:
CT NECK WITH CONTRAST
TECHNIQUE: Multidetector CT imaging of the neck was performed using the
standard protocol following the bolus administration of intravenous
contrast.
CONTRAST:  75mL OMNIPAQUE IOHEXOL 300 MG/ML  SOLN

[Series 5: axial · axial · 0.36mm/px · z∈[-277,-73]mm · 4 of 151 slices shown, 5 images]
[im 22/151  soft-tissue]
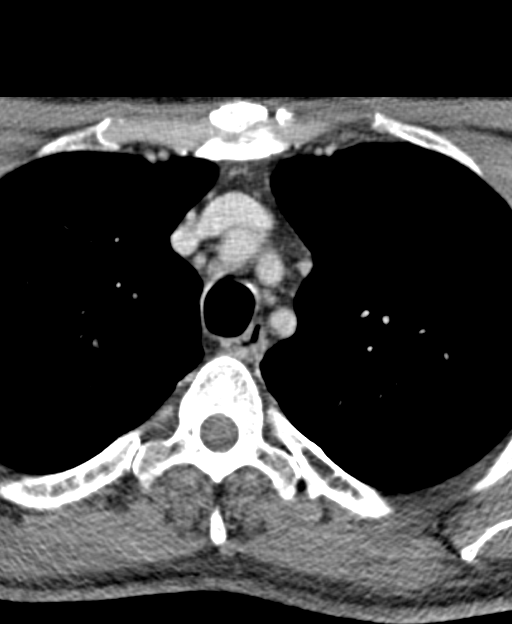
[im 22/151  bone]
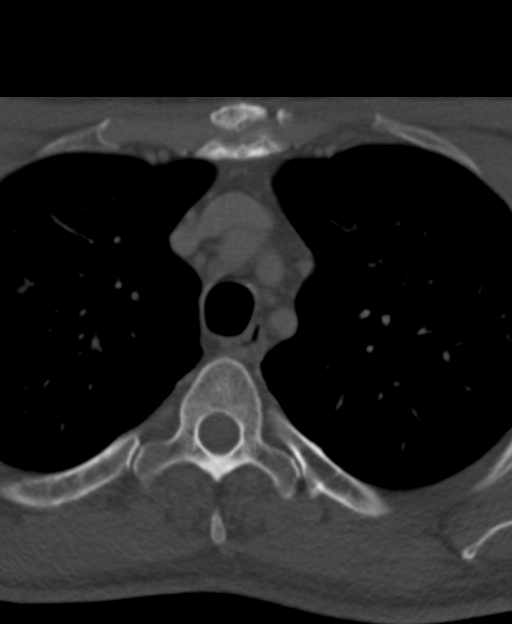
[im 65/151  bone]
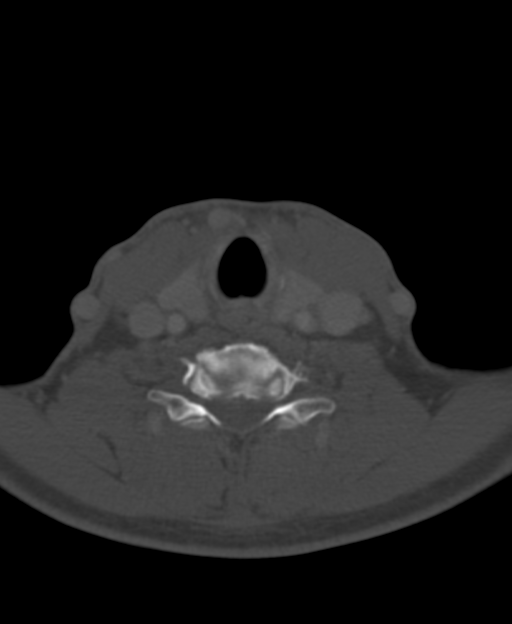
[im 86/151  bone]
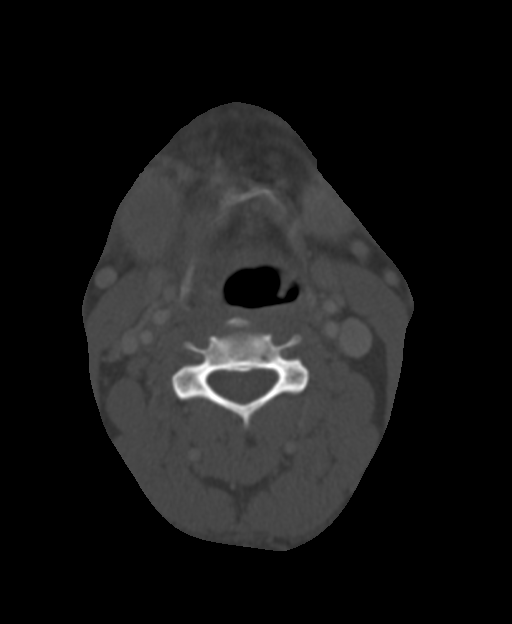
[im 129/151  bone]
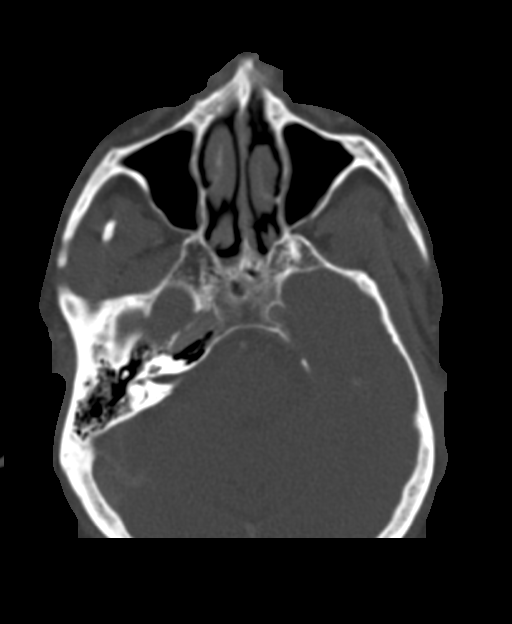

[Series 6: coronal · coronal · 0.38mm/px · 3 of 101 slices shown]
[im 30/101  bone]
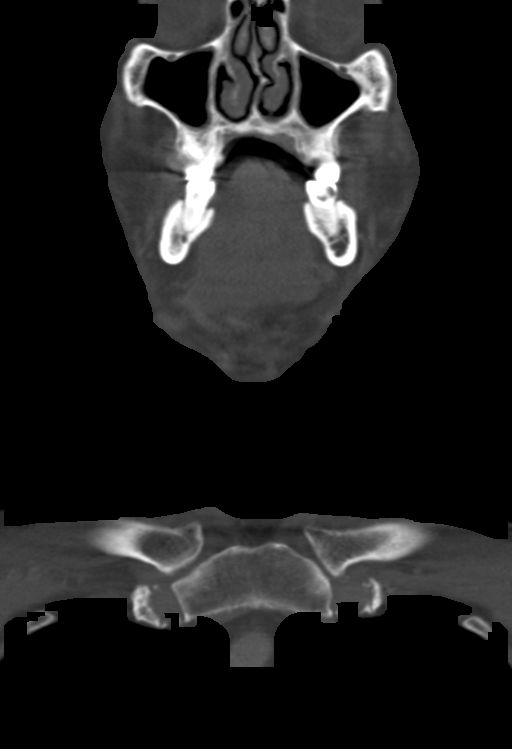
[im 44/101  bone]
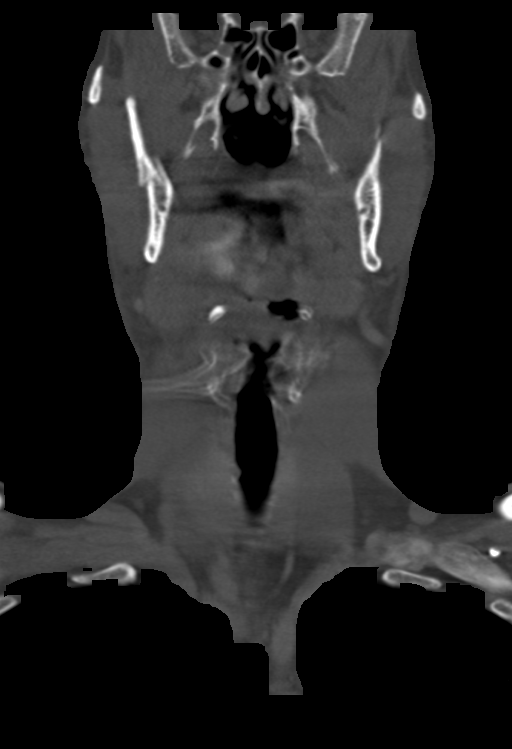
[im 57/101  bone]
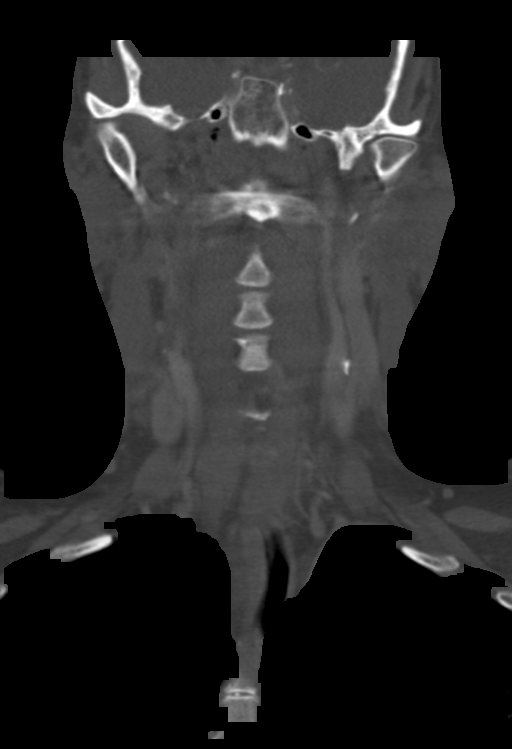

[Series 7: sagittal · sagittal · 0.44mm/px · 5 of 101 slices shown, 6 images]
[im 34/101  bone]
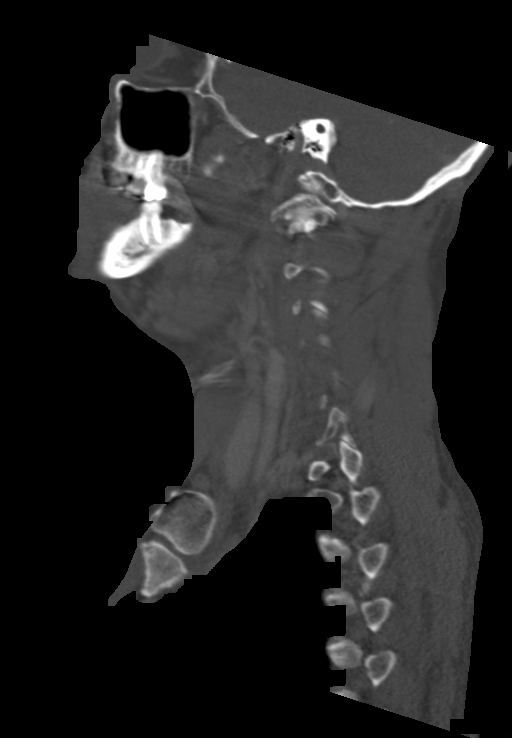
[im 42/101  bone]
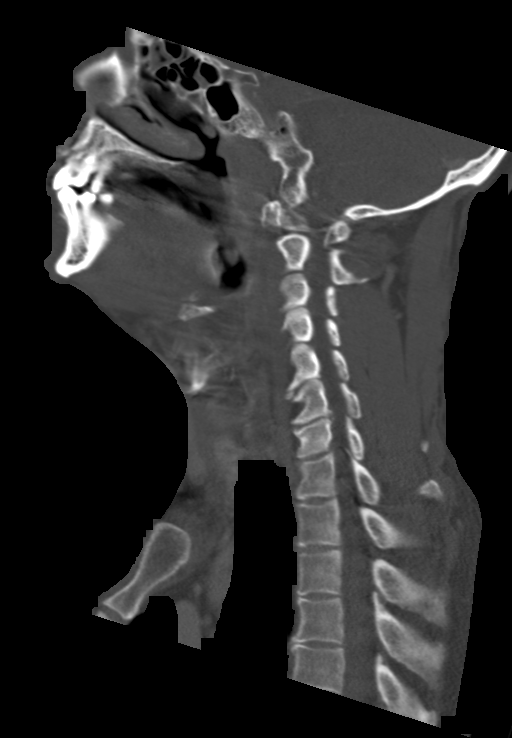
[im 51/101  soft-tissue]
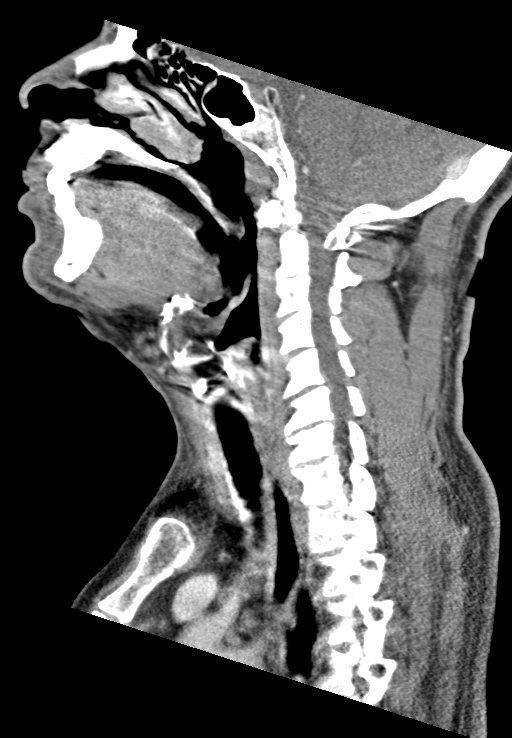
[im 51/101  bone]
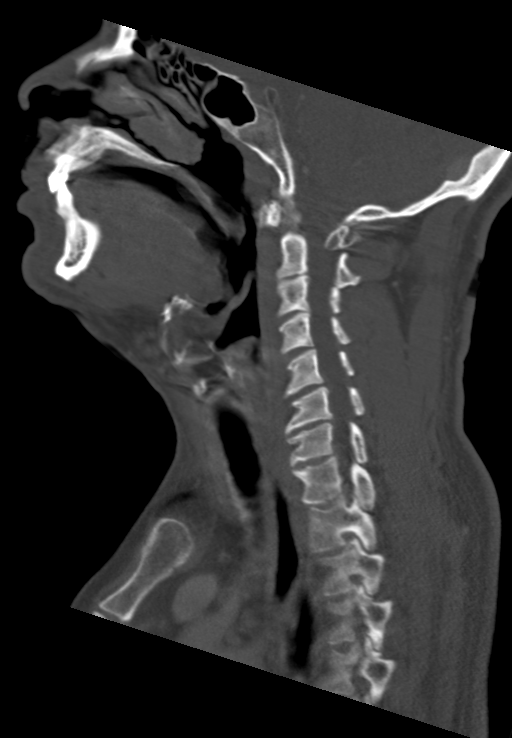
[im 59/101  bone]
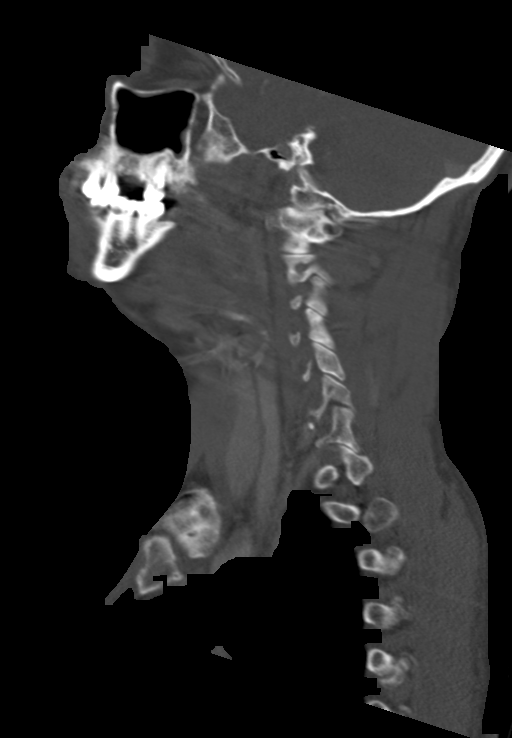
[im 67/101  bone]
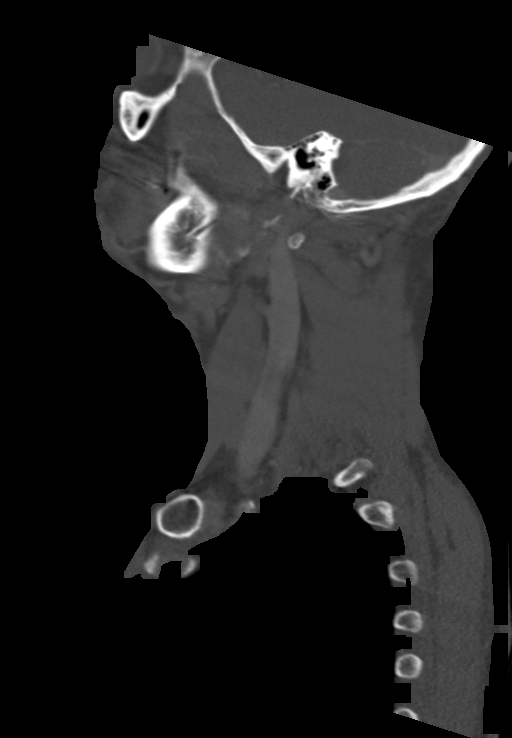

[12 of 33 positions shown; findings below may reference images not displayed]

FINDINGS: Pharynx and larynx: The study is moderately degraded by patient
motion. Right-sided inflammatory changes are present at the tongue
base and vallecula. Palatine tonsils are enlarged bilaterally. There
is stranding of the parapharyngeal fat on the right. Focal mucosal
or submucosal lesions are present otherwise. Vocal cords are midline
and symmetric. The trachea is normal.

Salivary glands: Submandibular glands are edematous bilaterally. No
duct obstruction is present. Parotid glands are normal.

Thyroid: Normal.

Lymph nodes: Reactive level 2 lymph nodes are present. No
significant submandibular lymph nodes are present. Right level 2
node nodes measure up to 17 mm. The nodes are ovoid.

Vascular: Bilaterally without atherosclerotic changes are noted at
the carotid significant stenosis bifurcations.

Limited intracranial: Within normal limits.

Visualized orbits: The globes and orbits are within normal limits.

Mastoids and visualized paranasal sinuses: Mild mucosal thickening
is present in the ethmoid air cells and left greater than right
maxillary sinuses. Mastoid air cells are clear bilaterally.

Skeleton: Vertebral body heights and alignment are normal.

Upper chest: Centrilobular emphysematous changes are present
bilaterally. No focal nodule, mass, or airspace disease is present.
IMPRESSION: 1. Right-sided inflammatory changes with enlargement of the palatine
tonsils bilaterally and stranding of the parapharyngeal fat on the
right. Findings are consistent with acute pharyngitis. No definite
abscess is present.
2. Enlarged right level 2 lymph nodes are likely reactive.
3. Bilateral submandibular gland sialoadenitis without duct
obstruction.
4. Atherosclerosis without significant stenosis. Aortic
Atherosclerosis (AWNDI-A38.8).
5. Emphysema ([id number]-31F.M).

## 2022-02-13 DEATH — deceased

## 2022-03-18 NOTE — Progress Notes (Incomplete)
Mr. 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 He completed treatment on  09-27-19.   Pain issues, if any: *** Using a feeding tube?: *** Weight changes, if any: *** Swallowing issues, if any: *** Smoking or chewing tobacco? *** Using fluoride trays daily? *** Last ENT visit was on:  Saw Dr. Hendricks Limes on 08-21-21 Other notable issues, if any: ***

## 2022-03-26 ENCOUNTER — Telehealth: Payer: Self-pay | Admitting: *Deleted

## 2022-03-26 NOTE — Telephone Encounter (Signed)
Called patient to remind of lab and fu for 03-27-22, lvm for a return call

## 2022-03-27 ENCOUNTER — Ambulatory Visit: Payer: No Typology Code available for payment source

## 2022-03-27 ENCOUNTER — Ambulatory Visit
Admission: RE | Admit: 2022-03-27 | Payer: No Typology Code available for payment source | Source: Ambulatory Visit | Admitting: Radiation Oncology

## 2022-03-27 DIAGNOSIS — C040 Malignant neoplasm of anterior floor of mouth: Secondary | ICD-10-CM
